# Patient Record
Sex: Male | Born: 1955
Health system: Southern US, Community
[De-identification: ages and names within clinical notes are randomized; demographics above are authoritative.]

## PROBLEM LIST (undated history)

## (undated) DIAGNOSIS — F32A Depression, unspecified: Secondary | ICD-10-CM

## (undated) DIAGNOSIS — E559 Vitamin D deficiency, unspecified: Secondary | ICD-10-CM

## (undated) DIAGNOSIS — B019 Varicella without complication: Secondary | ICD-10-CM

## (undated) DIAGNOSIS — T8859XA Other complications of anesthesia, initial encounter: Secondary | ICD-10-CM

## (undated) DIAGNOSIS — R0602 Shortness of breath: Secondary | ICD-10-CM

## (undated) DIAGNOSIS — I10 Essential (primary) hypertension: Secondary | ICD-10-CM

## (undated) DIAGNOSIS — R609 Edema, unspecified: Secondary | ICD-10-CM

## (undated) DIAGNOSIS — T4145XA Adverse effect of unspecified anesthetic, initial encounter: Secondary | ICD-10-CM

## (undated) DIAGNOSIS — M2550 Pain in unspecified joint: Secondary | ICD-10-CM

## (undated) DIAGNOSIS — E669 Obesity, unspecified: Secondary | ICD-10-CM

## (undated) DIAGNOSIS — R7303 Prediabetes: Secondary | ICD-10-CM

## (undated) DIAGNOSIS — M1990 Unspecified osteoarthritis, unspecified site: Secondary | ICD-10-CM

## (undated) DIAGNOSIS — M7989 Other specified soft tissue disorders: Secondary | ICD-10-CM

## (undated) HISTORY — DX: Varicella without complication: B01.9

## (undated) HISTORY — DX: Essential (primary) hypertension: I10

## (undated) HISTORY — DX: Vitamin D deficiency, unspecified: E55.9

## (undated) HISTORY — DX: Other specified soft tissue disorders: M79.89

## (undated) HISTORY — DX: Shortness of breath: R06.02

## (undated) HISTORY — DX: Obesity, unspecified: E66.9

## (undated) HISTORY — DX: Unspecified osteoarthritis, unspecified site: M19.90

## (undated) HISTORY — DX: Edema, unspecified: R60.9

## (undated) HISTORY — DX: Pain in unspecified joint: M25.50

## (undated) HISTORY — DX: Prediabetes: R73.03

## (undated) HISTORY — DX: Depression, unspecified: F32.A

## (undated) HISTORY — PX: OTHER SURGICAL HISTORY: SHX169

---
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
## 1998-04-12 ENCOUNTER — Ambulatory Visit (HOSPITAL_BASED_OUTPATIENT_CLINIC_OR_DEPARTMENT_OTHER): Admission: RE | Admit: 1998-04-12 | Discharge: 1998-04-12 | Payer: Self-pay | Admitting: Orthopaedic Surgery

## 1998-05-07 HISTORY — PX: KNEE SURGERY: SHX244

## 2005-11-09 ENCOUNTER — Ambulatory Visit: Payer: Self-pay | Admitting: Family Medicine

## 2005-11-09 LAB — CONVERTED CEMR LAB: PSA: 3.5 ng/mL

## 2005-11-15 ENCOUNTER — Ambulatory Visit: Payer: Self-pay | Admitting: Family Medicine

## 2005-11-22 ENCOUNTER — Ambulatory Visit: Payer: Self-pay | Admitting: Family Medicine

## 2005-12-10 ENCOUNTER — Ambulatory Visit: Payer: Self-pay | Admitting: Family Medicine

## 2005-12-24 ENCOUNTER — Ambulatory Visit: Payer: Self-pay | Admitting: Family Medicine

## 2006-10-31 ENCOUNTER — Encounter: Payer: Self-pay | Admitting: Family Medicine

## 2006-10-31 DIAGNOSIS — I10 Essential (primary) hypertension: Secondary | ICD-10-CM | POA: Insufficient documentation

## 2006-11-14 ENCOUNTER — Ambulatory Visit: Payer: Self-pay | Admitting: Family Medicine

## 2006-11-14 LAB — CONVERTED CEMR LAB
ALT: 42 units/L (ref 0–53)
AST: 24 units/L (ref 0–37)
Albumin: 3.7 g/dL (ref 3.5–5.2)
Alkaline Phosphatase: 61 units/L (ref 39–117)
BUN: 10 mg/dL (ref 6–23)
Basophils Absolute: 0 10*3/uL (ref 0.0–0.1)
Basophils Relative: 0.4 % (ref 0.0–1.0)
Bilirubin, Direct: 0.1 mg/dL (ref 0.0–0.3)
CO2: 29 meq/L (ref 19–32)
Calcium: 8.6 mg/dL (ref 8.4–10.5)
Chloride: 102 meq/L (ref 96–112)
Cholesterol: 195 mg/dL (ref 0–200)
Creatinine, Ser: 1.1 mg/dL (ref 0.4–1.5)
Eosinophils Absolute: 0 10*3/uL (ref 0.0–0.6)
Eosinophils Relative: 0.4 % (ref 0.0–5.0)
GFR calc Af Amer: 90 mL/min
GFR calc non Af Amer: 75 mL/min
Glucose, Bld: 100 mg/dL — ABNORMAL HIGH (ref 70–99)
HCT: 42.4 % (ref 39.0–52.0)
HDL: 43.1 mg/dL (ref >39.0)
Hemoglobin: 14.3 g/dL (ref 13.0–17.0)
LDL Cholesterol: 137 mg/dL — ABNORMAL HIGH (ref 0–99)
Lymphocytes Relative: 25.2 % (ref 12.0–46.0)
MCHC: 33.7 g/dL (ref 30.0–36.0)
MCV: 87.4 fL (ref 78.0–100.0)
Monocytes Absolute: 0.4 10*3/uL (ref 0.2–0.7)
Monocytes Relative: 6.9 % (ref 3.0–11.0)
Neutro Abs: 3.9 10*3/uL (ref 1.4–7.7)
Neutrophils Relative %: 67.1 % (ref 43.0–77.0)
PSA: 0.28 ng/mL (ref 0.10–4.00)
Platelets: 207 10*3/uL (ref 150–400)
Potassium: 3.9 meq/L (ref 3.5–5.1)
RBC: 4.85 M/uL (ref 4.22–5.81)
RDW: 13.5 % (ref 11.5–14.6)
Sodium: 139 meq/L (ref 135–145)
TSH: 1.95 microintl units/mL (ref 0.35–5.50)
Total Bilirubin: 0.9 mg/dL (ref 0.3–1.2)
Total CHOL/HDL Ratio: 4.5
Total Protein: 7.3 g/dL (ref 6.0–8.3)
Triglycerides: 76 mg/dL (ref 0–149)
VLDL: 15 mg/dL (ref 0–40)
WBC: 5.8 10*3/uL (ref 4.5–10.5)

## 2007-01-20 ENCOUNTER — Ambulatory Visit: Payer: Self-pay | Admitting: Family Medicine

## 2007-04-25 ENCOUNTER — Telehealth: Payer: Self-pay | Admitting: Family Medicine

## 2008-06-09 ENCOUNTER — Telehealth: Payer: Self-pay | Admitting: Family Medicine

## 2010-06-06 NOTE — Progress Notes (Signed)
Summary: Cough Rx request  Phone Note Call from Patient Call back at Home Phone [phone]   Caller: Patient Call For: Roderick Pee MD Summary of Call: Wife called back to inform Dr Tawanna Cooler she saw him for cough approx 2 years ago, no cough syrup on med list, [facility] does not have record of a RF. Requesting cough Rx for pt. Initial call taken by: Sid Falcon LPN,  June 09, 2008 10:37 AM  Follow-up for Phone Call        Hydromet dispense 8 ounces directions one or 2 teaspoons t.i.d. p.r.n. cough, refills x 1 Follow-up by: Roderick Pee MD,  June 09, 2008 12:15 PM  Additional Follow-up for Phone Call Additional follow up Details #1::        Rx telephoned in, pt informed on personal VM to check with his pharmacy Additional Follow-up by: Sid Falcon LPN,  June 09, 2008 1:29 PM    New/Updated Medications: HYDROMET 5-1.5 MG/5ML SYRP (HYDROCODONE-HOMATROPINE) one to two tsp every 8 hours as needed for cough   Prescriptions: HYDROMET 5-1.5 MG/5ML SYRP (HYDROCODONE-HOMATROPINE) one to two tsp every 8 hours as needed for cough  #8oz x 1   Entered by:   Sid Falcon LPN   Authorized by:   Roderick Pee MD   Signed by:   Sid Falcon LPN on 02/72/5366   Method used:   Telephoned to ...       [facility] [address] (retail)       [address]       [place], [place]  [address]       Ph: [phone]       Fax: [phone]   RxID:   [id number]

## 2010-06-06 NOTE — Progress Notes (Signed)
Summary: new rx for new sig- WHICH DOSE?  Phone Note From Pharmacy Call back at [phone]   Caller: [facility] [address] Call For: James [name]  Summary of Call: Ned new rx for Furosemide 40 mg 1 daily #34 last fill oct 13 08.  Dr James [name] changed his does to 2 tablets daily.  Needs new rx with the new sig  Initial call taken by: Roselle Locus,  April 25, 2007 9:27 AM  Follow-up for Phone Call        okay to call him Lasix 40 mg two tablets daily, dispensed 200, refills x 4 Follow-up by: Roderick Pee MD,  April 25, 2007 1:28 PM    New/Updated Medications: FUROSEMIDE 40 MG  TABS (FUROSEMIDE) 2 TABS once daily FUROSEMIDE 40 MG  TABS (FUROSEMIDE)  FUROSEMIDE 40 MG  TABS (FUROSEMIDE) 2 tabs  once a day   Prescriptions: FUROSEMIDE 40 MG  TABS (FUROSEMIDE) 2 tabs  once a day  #200 x 4   Entered by:   Arcola Jansky, RN   Authorized by:   Roderick Pee MD   Signed by:   Arcola Jansky, RN on 04/25/2007   Method used:   Electronically sent to ...       [facility] [address]       [address]       [place], [place]  [address]       Ph: [phone]       Fax: [phone]   RxID:   [id number]

## 2010-09-22 NOTE — Assessment & Plan Note (Signed)
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   PAST MEDICAL HISTORY:  He had surgery on his right knee when he was 55 years  of age with torn cartilage.  Since that time he has developed increasing  pain.  Now he cannot walk, cannot exercise, cannot climb stairs.  Will get  him to see Lajoyce Corners for evaluation.  He had outpatient surgery on that knee  in 2000, was scoped by [facility] and now again he has pretty  much shot that knee and he is going to need a total knee.   PAST ILLNESSES:  None.   INJURIES:  None.   ALLERGIES:  Drug allergies:  None.   HABITS:  He does not smoke or drink any alcohol.   MEDICATIONS:  He takes no medications.   The issues he would like to discuss are pain in his right knee, the  inability to walk, fluid retention, skin problems, question sleep apnea, and  obesity.   REVIEW OF SYSTEMS:  HEENT:  Negative except he wears glasses for distance  vision.  He gets eye exams every two years for glaucoma.  He gets regular  dental care.  CARDIOPULMONARY:  Negative.  GASTROINTESTINAL:  Negative.  GENITOURINARY:  Negative.  ENDOCRINE:  Negative.  Weight gone up.  He is now  up to 326 pounds at 5 feet 5-1/2 inches.  MUSCULOSKELETAL:  Negative except  the right knee.  VASCULAR:  Negative.  ALLERGY:   Negative.   SOCIAL HISTORY:  He is married, lives here in [place], originally from  [place].  He is married and has two children, Lillia Abed, 22, and Susy Frizzle is 21.  He  is a Buyer, retail of [employer].   FAMILY HISTORY:  Dad died at 69, CHF, diabetes, type 2, peripheral vascular  disease.  Mom died in her 55s of a sarcoma.  Two brothers in  good health.  One has asthma.  Two sisters in good health.  Last tetanus 2004.   PHYSICAL EXAMINATION:  VITAL SIGNS:  Height 5 feet 5-1/2 inches, weight 326  pounds, temperature 97, pulse was 70 and regular, respirations 12 and  regular, blood pressure 174/100, repeat by me same.  GENERAL:  In general he is an obese male, no acute distress.  HEENT:  Negative.  NECK:  Supple.  Thyroid is not enlarged.  No carotid bruits.  CHEST:  Clear to auscultation.  CARDIAC:  Negative.  ABDOMEN:  Massive panniculus.  GENITALIA:  Normal male.  RECTAL:  Normal.  Stool guaiac negative.  Prostate normal.  EXTREMITIES:  Normal.  Skin normal.  No peripheral pulses.  Normal except  for 2+ edema.   LABORATORY DATA:  CBC was normal as was the lipid panel, metabolic panel  except for slight  elevation of blood sugar of 101, with an A1c of 5.5.  TSH  and PSA were normal as was the UA except he has blood in his urine.  He had  a history of stones in the past.  He thinks he might be trying to pass  another one again.   IMPRESSION:  1.  Hypertension.  Plan:  Discussed diet, exercise, weight loss, and      medication.  He cannot exercise because of his knee.  Because of fluid      retention we started Lasix 20 mg a day.  Blood pressure checked b.i.d.      Follow up in one week.  2.  Probable sleep apnea.  Question narcolepsy.  We started him on      __________100 mg daily.  3.  Obesity.  Addressed with diet, exercise, and weight loss after he gets      his knee done.  4.  Degenerative joint disease, right knee.  Refer to Dr. Lajoyce Corners for      consultation for total knee replacement.  5.  Question sleep apnea.  Will try the __________  If this does not work      will go ahead and send him for sleep consultation.  6.  Hematuria.  Drink lots of water.  Recontact urology for evaluation of      stone.   An hour was spent with the patient going through his medical history,  physical exam, laboratory data, and  game plan for follow-up.  Will  aggressively treat his hypertension.  This will need to be done before he  will be able to have any surgery to correct that knee.  Once that is done  hopefully we can get him on a program of diet, exercise, and weight loss.                                   Jeffrey A. Tawanna Cooler, MD   JAT/MedQ  DD:  11/18/2005  DT:  11/18/2005  Job #:  [id number]

## 2012-07-16 ENCOUNTER — Encounter: Payer: Self-pay | Admitting: Family Medicine

## 2012-07-16 ENCOUNTER — Ambulatory Visit (INDEPENDENT_AMBULATORY_CARE_PROVIDER_SITE_OTHER): Payer: BC Managed Care – PPO | Admitting: Family Medicine

## 2012-07-16 VITALS — BP 140/82 | HR 81 | Temp 98.5°F | Ht 65.5 in | Wt 350.0 lb

## 2012-07-16 DIAGNOSIS — Z0000 Encounter for general adult medical examination without abnormal findings: Secondary | ICD-10-CM

## 2012-07-16 DIAGNOSIS — Z7189 Other specified counseling: Secondary | ICD-10-CM

## 2012-07-16 DIAGNOSIS — Z7689 Persons encountering health services in other specified circumstances: Secondary | ICD-10-CM

## 2012-07-16 NOTE — Patient Instructions (Addendum)
-  We have ordered labs or studies at this visit. It can take up to 1-2 weeks for results and processing. We will contact you with instructions IF your results are abnormal. Normal results will be released to your [facility]. If you have not heard from [name] or can not find your results in [facility] in 2 weeks please contact our office.  -PLEASE SIGN UP FOR MYCHART TODAY   We recommend the following healthy lifestyle measures: - eat a healthy diet consisting of lots of vegetables, fruits, beans, nuts, seeds, healthy meats such as white chicken and fish and whole grains.  - avoid fried foods, fast food, processed foods, sodas, red meet and other fattening foods.  - get a least 150 minutes of aerobic exercise per week.   Please let us know if you decide to do a colonoscopy  Follow up in: 3 months

## 2012-07-16 NOTE — Progress Notes (Signed)
Chief Complaint  Patient presents with  . Establish Care  . Sinus Problem    congestion, pressure and pain, dizziness, headache     HPI:  James [name] is here to establish care. Used to see Dr. Tawanna Cooler, but hasn't seen a doctor in a while. Wife sees Dr. Tawanna Cooler. Last PCP and physical:  Has the following chronic problems and concerns today:  Patient Active Problem List  Diagnosis  . OBESITY, MORBID  . HYPERTENSION   Sinus Congestion: -7-10 days -symptoms: sinus congestion, sinus pain and pressure, drainage in throat, dizzy, eat pain -seems like improving - no dizziness today -tried some zyrtec  Health Maintenance: -he will check on insurance for colonoscopy  ROS: See pertinent positives and negatives per HPI.  Past Medical History  Diagnosis Date  . Hypertension     Family History  Problem Relation Age of Onset  . Cancer Mother     lung and soft tissue cancer from radiation  . Heart disease Father     CHF  . Diabetes Father   . Heart disease Maternal Grandfather   . Diabetes Paternal Grandmother     History   Social History  . Marital Status: Married    Spouse Name: N/A    Number of Children: N/A  . Years of Education: N/A   Social History Main Topics  . Smoking status: Former Smoker    Quit date: 07/16/1992  . Smokeless tobacco: None  . Alcohol Use: Yes     Comment: very little per patient   . Drug Use: No  . Sexually Active: None   Other Topics Concern  . 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: living with and caring for wife whom had stroke and brain tumor      Spiritual Beliefs: Methodist      Lifestyle: no regular exercise, diet poor             Current outpatient prescriptions:aspirin 325 MG tablet, Take 325 mg by mouth daily., Disp: , Rfl: ;  acetaminophen (TYLENOL) 650 MG CR tablet, Take 650 mg by mouth every 8 (eight) hours as needed for pain., Disp: , Rfl:    EXAM:  Filed Vitals:   07/16/12 1105  BP: 140/82  Pulse: 81  Temp: 98.5 F (36.9 C)    Body mass index is 57.34 kg/(m^2).  GENERAL: vitals reviewed and listed above, alert, oriented, appears well hydrated and in no acute distress  HEENT: atraumatic, conjunttiva clear, no obvious abnormalities on inspection of external nose and ears  NECK: no obvious masses on inspection  LUNGS: clear to auscultation bilaterally, no wheezes, rales or rhonchi, good air movement  CV: HRRR, no peripheral edema  MS: moves all extremities without noticeable abnormality  PSYCH: pleasant and cooperative, no obvious depression or anxiety  ASSESSMENT AND PLAN:  Discussed the following assessment and plan:  Encounter to establish care  Visit for preventive health examination - Plan: Lipid Panel, Basic metabolic panel, Hemoglobin A1c  -We reviewed the PMH, PSH, FH, SH, Meds and Allergies. -discussed USPSTF level A and B recommendations -will do FASTING LABS tomorrow -refused prostate cancer screening after discussion -will check with insurance on colonoscopy -reports UTD on vaccines -advised healthy diet, regular exercise and counseled extensively on importance of weight loss, advised of [place] and [facility] weight loss recourses -follow up in 3 months  -Patient advised to return or  notify a doctor immediately if symptoms worsen or persist or new concerns arise.  Patient Instructions  -We have ordered labs or studies at this visit. It can take up to 1-2 weeks for results and processing. We will contact you with instructions IF your results are abnormal. Normal results will be released to your [facility]. If you have not heard from [name] or can not find your results in [facility] in 2 weeks please contact our office.  -PLEASE SIGN UP FOR MYCHART TODAY   We recommend the following healthy lifestyle measures: - eat a healthy diet consisting of lots of vegetables, fruits, beans, nuts, seeds, healthy  meats such as white chicken and fish and whole grains.  - avoid fried foods, fast food, processed foods, sodas, red meet and other fattening foods.  - get a least 150 minutes of aerobic exercise per week.   Please let us know if you decide to do a colonoscopy  Follow up in: 3 months      KIM, HANNAH R.

## 2012-07-17 ENCOUNTER — Telehealth: Payer: Self-pay | Admitting: Family Medicine

## 2012-07-17 ENCOUNTER — Other Ambulatory Visit (INDEPENDENT_AMBULATORY_CARE_PROVIDER_SITE_OTHER): Payer: BC Managed Care – PPO

## 2012-07-17 DIAGNOSIS — Z0000 Encounter for general adult medical examination without abnormal findings: Secondary | ICD-10-CM

## 2012-07-17 LAB — LIPID PANEL
Cholesterol: 175 mg/dL (ref 0–200)
HDL: 41.6 mg/dL (ref >39.00)
LDL Cholesterol: 116 mg/dL — ABNORMAL HIGH (ref 0–99)
Total CHOL/HDL Ratio: 4
Triglycerides: 87 mg/dL (ref 0.0–149.0)
VLDL: 17.4 mg/dL (ref 0.0–40.0)

## 2012-07-17 LAB — BASIC METABOLIC PANEL
BUN: 18 mg/dL (ref 6–23)
CO2: 28 mEq/L (ref 19–32)
Calcium: 8.6 mg/dL (ref 8.4–10.5)
Chloride: 99 mEq/L (ref 96–112)
Creatinine, Ser: 0.9 mg/dL (ref 0.4–1.5)
GFR: 88.51 mL/min (ref >60.00)
Glucose, Bld: 90 mg/dL (ref 70–99)
Potassium: 3.4 mEq/L — ABNORMAL LOW (ref 3.5–5.1)
Sodium: 138 mEq/L (ref 135–145)

## 2012-07-17 LAB — HEMOGLOBIN A1C: Hgb A1c MFr Bld: 5.8 % (ref 4.6–6.5)

## 2012-07-17 NOTE — Telephone Encounter (Signed)
Labs look pretty good. Diabetes lab is in prediabetic range and cholesterol is mildly elevated. Regular exercise and a healthy diet will be very important. Follow up in 3 months.

## 2012-07-18 NOTE — Telephone Encounter (Signed)
Called and spoke with pt and pt is aware.  

## 2012-09-02 ENCOUNTER — Telehealth: Payer: Self-pay | Admitting: Family Medicine

## 2012-09-02 NOTE — Telephone Encounter (Signed)
OPENED IN ERROR

## 2012-09-02 NOTE — Telephone Encounter (Signed)
PT called Medicare

## 2012-10-16 ENCOUNTER — Ambulatory Visit (INDEPENDENT_AMBULATORY_CARE_PROVIDER_SITE_OTHER): Payer: BC Managed Care – PPO | Admitting: Family Medicine

## 2012-10-16 ENCOUNTER — Encounter: Payer: Self-pay | Admitting: Family Medicine

## 2012-10-16 VITALS — BP 162/80 | Temp 98.3°F | Wt 346.0 lb

## 2012-10-16 DIAGNOSIS — R7309 Other abnormal glucose: Secondary | ICD-10-CM

## 2012-10-16 DIAGNOSIS — R7303 Prediabetes: Secondary | ICD-10-CM

## 2012-10-16 DIAGNOSIS — E785 Hyperlipidemia, unspecified: Secondary | ICD-10-CM

## 2012-10-16 DIAGNOSIS — I10 Essential (primary) hypertension: Secondary | ICD-10-CM

## 2012-10-16 LAB — BASIC METABOLIC PANEL
BUN: 13 mg/dL (ref 6–23)
CO2: 23 mEq/L (ref 19–32)
Calcium: 9.1 mg/dL (ref 8.4–10.5)
Chloride: 106 mEq/L (ref 96–112)
Creatinine, Ser: 0.9 mg/dL (ref 0.4–1.5)
GFR: 91.84 mL/min (ref >60.00)
Glucose, Bld: 110 mg/dL — ABNORMAL HIGH (ref 70–99)
Potassium: 3.6 mEq/L (ref 3.5–5.1)
Sodium: 141 mEq/L (ref 135–145)

## 2012-10-16 MED ORDER — HYDROCHLOROTHIAZIDE 25 MG PO TABS: 25.0000 mg | ORAL_TABLET | Freq: Every day | ORAL | Status: DC

## 2012-10-16 MED ORDER — LISINOPRIL 5 MG PO TABS: 5.0000 mg | ORAL_TABLET | Freq: Every day | ORAL | Status: DC

## 2012-10-16 NOTE — Addendum Note (Signed)
Addended by: Terressa Koyanagi on: 10/16/2012 01:05 PM   Modules accepted: Orders

## 2012-10-16 NOTE — Progress Notes (Signed)
Chief Complaint  Patient presents with  . 3 month follow up    HPI:  Follow up:  1)HTN: -hx of ups and downs with HTN -reports on BP medication once in the past then lost insurance and off since -Denies: CP, sOB, DOE, palpitations  2)Prediabetes: -no regular exercise, diet is ok -denies polyuria, polydipsia, vision changes, fatgue  3)HLD/morbid obesity: -no regular exercise -taking care of wife who had stroke  4) Chronic venous insufficieny/lower ext edema -wears compression occ -some skin changes for 4-5 years with this  Health Maintenance: -colon cancer screening: does not want to do colonoscopy now, ok with doing stool cards  ROS: See pertinent positives and negatives per HPI.  Past Medical History  Diagnosis Date  . Hypertension   . Arthritis   . Chicken pox   . Hypertension     Family History  Problem Relation Age of Onset  . Cancer Mother     lung and soft tissue cancer from radiation  . Heart disease Father     CHF  . Diabetes Father   . Heart disease Maternal Grandfather   . Diabetes Paternal Grandmother     History   Social History  . Marital Status: Married    Spouse Name: N/A    Number of Children: N/A  . Years of Education: N/A   Social History Main Topics  . Smoking status: Former Smoker    Quit date: 07/16/1992  . Smokeless tobacco: None  . Alcohol Use: Yes     Comment: very little per patient   . Drug Use: No  . Sexually Active: None   Other Topics Concern  . 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: living with and caring for wife whom had stroke and brain tumor      Spiritual Beliefs: Methodist      Lifestyle: no regular exercise, diet poor             Current outpatient prescriptions:ibuprofen (ADVIL,MOTRIN) 200 MG tablet, Take 200 mg by mouth every 6 (six) hours as needed for pain., Disp: , Rfl: ;  acetaminophen (TYLENOL) 650 MG CR tablet,  Take 650 mg by mouth every 8 (eight) hours as needed for pain., Disp: , Rfl: ;  aspirin 325 MG tablet, Take 325 mg by mouth daily., Disp: , Rfl:   EXAM:  Filed Vitals:   10/16/12 0847  BP: 162/80  Temp: 98.3 F (36.8 C)    Body mass index is 56.68 kg/(m^2).  GENERAL: vitals reviewed and listed above, alert, oriented, appears well hydrated and in no acute distress  HEENT: atraumatic, conjunttiva clear, no obvious abnormalities on inspection of external nose and ears  NECK: no obvious masses on inspection  LUNGS: clear to auscultation bilaterally, no wheezes, rales or rhonchi, good air movement  CV: HRRR, bilat LE edema with venous stasis changes  MS: moves all extremities without noticeable abnormality  PSYCH: pleasant and cooperative, no obvious depression or anxiety  ASSESSMENT AND PLAN:  Discussed the following assessment and plan:  HYPERTENSION - Plan: Basic metabolic panel  OBESITY, MORBID  Prediabetes  Hyperlipemia  -will check BMP and if K+ ok start diuretic for HTN and Edema. If K+ low will do acei/diuretic together - discussed options and risks with pt -counseled at length on importance of lifestyle changes and weight loss, weight loss options, he is taking care of wife right now, but  will try to start exercising and work on diet -discussed colon cancer screening, he refused a colonoscopy, he was given stool cards -Patient advised to return or notify a doctor immediately if symptoms worsen or persist or new concerns arise.  Patient Instructions  -compression stockings daily  -elevate legs at night  -after labs we will call you to start blood pressure/fluid pill  -follow up in 3 months     Caidan Hubbert R.

## 2012-10-16 NOTE — Patient Instructions (Addendum)
-  compression stockings daily  -elevate legs at night  -after labs we will call you to start blood pressure/fluid pill  -follow up in 3 months

## 2012-10-17 NOTE — Progress Notes (Signed)
Quick Note:  Per pt's wife pt received labs on MyChart. Pt will get the two prescriptions. ______

## 2012-10-29 ENCOUNTER — Other Ambulatory Visit: Payer: BC Managed Care – PPO

## 2012-12-17 ENCOUNTER — Other Ambulatory Visit: Payer: Self-pay | Admitting: Family Medicine

## 2013-01-15 ENCOUNTER — Encounter: Payer: Self-pay | Admitting: Family Medicine

## 2013-01-15 ENCOUNTER — Ambulatory Visit (INDEPENDENT_AMBULATORY_CARE_PROVIDER_SITE_OTHER): Payer: BC Managed Care – PPO | Admitting: Family Medicine

## 2013-01-15 VITALS — BP 120/84 | Temp 98.7°F | Wt 339.0 lb

## 2013-01-15 DIAGNOSIS — Z23 Encounter for immunization: Secondary | ICD-10-CM

## 2013-01-15 DIAGNOSIS — I10 Essential (primary) hypertension: Secondary | ICD-10-CM

## 2013-01-15 MED ORDER — LISINOPRIL 5 MG PO TABS: 5.0000 mg | ORAL_TABLET | Freq: Every day | ORAL | Status: DC

## 2013-01-15 MED ORDER — HYDROCHLOROTHIAZIDE 25 MG PO TABS: 25.0000 mg | ORAL_TABLET | Freq: Every day | ORAL | Status: DC

## 2013-01-15 NOTE — Progress Notes (Signed)
Chief Complaint  Patient presents with  . 3 month follow up    HPI:  Follow up:  HTN/Obesity: -takes ASA, lisinopril, HCTZ -diet and exercise: no regular exercise; trying to work on portion sizes and trying to work on healthier diet   ROS: See pertinent positives and negatives per HPI.  Past Medical History  Diagnosis Date  . Hypertension   . Arthritis   . Chicken pox   . Hypertension     Past Surgical History  Procedure Laterality Date  . Knee surgery  2000  . Cartilage removal      Family History  Problem Relation Age of Onset  . Cancer Mother     lung and soft tissue cancer from radiation  . Heart disease Father     CHF  . Diabetes Father   . Heart disease Maternal Grandfather   . Diabetes Paternal Grandmother     History   Social History  . Marital Status: Married    Spouse Name: N/A    Number of Children: N/A  . Years of Education: N/A   Social History Main Topics  . Smoking status: Former Smoker    Quit date: 07/16/1992  . Smokeless tobacco: None  . Alcohol Use: Yes     Comment: very little per patient   . Drug Use: No  . Sexual Activity: None   Other Topics Concern  . 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: living with and caring for wife whom had stroke and brain tumor      Spiritual Beliefs: Methodist      Lifestyle: no regular exercise, diet poor             Current outpatient prescriptions:acetaminophen (TYLENOL) 650 MG CR tablet, Take 650 mg by mouth every 8 (eight) hours as needed for pain., Disp: , Rfl: ;  aspirin 325 MG tablet, Take 325 mg by mouth daily., Disp: , Rfl: ;  ibuprofen (ADVIL,MOTRIN) 200 MG tablet, Take 200 mg by mouth every 6 (six) hours as needed for pain., Disp: , Rfl:  hydrochlorothiazide (HYDRODIURIL) 25 MG tablet, Take 1 tablet (25 mg total) by mouth daily., Disp: 90 tablet, Rfl: 3;  lisinopril (PRINIVIL,ZESTRIL) 5 MG tablet, Take 1  tablet (5 mg total) by mouth daily., Disp: 90 tablet, Rfl: 3  EXAM:  Filed Vitals:   01/15/13 1016  BP: 120/84  Temp: 98.7 F (37.1 C)    Body mass index is 55.53 kg/(m^2).  GENERAL: vitals reviewed and listed above, alert, oriented, appears well hydrated and in no acute distress  HEENT: atraumatic, conjunttiva clear, no obvious abnormalities on inspection of external nose and ears  NECK: no obvious masses on inspection  LUNGS: clear to auscultation bilaterally, no wheezes, rales or rhonchi, good air movement  CV: HRRR, no peripheral edema  MS: moves all extremities without noticeable abnormality  PSYCH: pleasant and cooperative, no obvious depression or anxiety  ASSESSMENT AND PLAN:  Discussed the following assessment and plan:  Need for prophylactic vaccination and inoculation against influenza - Plan: Flu Vaccine QUAD 36+ mos PF IM (Fluarix)  Essential hypertension, benign - Plan: hydrochlorothiazide (HYDRODIURIL) 25 MG tablet, lisinopril (PRINIVIL,ZESTRIL) 5 MG tablet  OBESITY, MORBID  -doing well, some weight loss -discussed weight loss options, info provided -having nurse check on on stool cards he sent in -continue current meds -labs next visit -Patient advised to return or notify a  doctor immediately if symptoms worsen or persist or new concerns arise.  Patient Instructions  We recommend the following healthy lifestyle measures: - eat a healthy diet consisting of lots of vegetables, fruits, beans, nuts, seeds, healthy meats such as white chicken and fish and whole grains.  - avoid fried foods, fast food, processed foods, sodas, red meet and other fattening foods.  - get a least 150 minutes of aerobic exercise per week.   [place] [facility] at [phone] to sign up for our next Bariatric Surgery Options Seminar. [email or web address]  Follow up in 4-6 months, morning appointment and will do FASTING LABS that day     Tarini Carrier,  Dahlia Client R.

## 2013-01-15 NOTE — Patient Instructions (Signed)
We recommend the following healthy lifestyle measures: - eat a healthy diet consisting of lots of vegetables, fruits, beans, nuts, seeds, healthy meats such as white chicken and fish and whole grains.  - avoid fried foods, fast food, processed foods, sodas, red meet and other fattening foods.  - get a least 150 minutes of aerobic exercise per week.   [place] Connect at [phone] to sign up for our next Bariatric Surgery Options Seminar. [email or web address]  Follow up in 4-6 months, morning appointment and will do FASTING LABS that day

## 2013-05-18 ENCOUNTER — Ambulatory Visit (INDEPENDENT_AMBULATORY_CARE_PROVIDER_SITE_OTHER): Payer: 59 | Admitting: Family Medicine

## 2013-05-18 ENCOUNTER — Encounter: Payer: Self-pay | Admitting: Family Medicine

## 2013-05-18 VITALS — BP 138/80 | HR 67 | Temp 98.8°F | Resp 20 | Wt 336.0 lb

## 2013-05-18 DIAGNOSIS — M25559 Pain in unspecified hip: Secondary | ICD-10-CM

## 2013-05-18 DIAGNOSIS — I10 Essential (primary) hypertension: Secondary | ICD-10-CM

## 2013-05-18 DIAGNOSIS — M25552 Pain in left hip: Secondary | ICD-10-CM

## 2013-05-18 LAB — LIPID PANEL
Cholesterol: 168 mg/dL (ref 0–200)
HDL: 43.2 mg/dL (ref >39.00)
LDL Cholesterol: 112 mg/dL — ABNORMAL HIGH (ref 0–99)
Total CHOL/HDL Ratio: 4
Triglycerides: 65 mg/dL (ref 0.0–149.0)
VLDL: 13 mg/dL (ref 0.0–40.0)

## 2013-05-18 LAB — BASIC METABOLIC PANEL
BUN: 18 mg/dL (ref 6–23)
CO2: 28 mEq/L (ref 19–32)
Calcium: 8.8 mg/dL (ref 8.4–10.5)
Chloride: 105 mEq/L (ref 96–112)
Creatinine, Ser: 1 mg/dL (ref 0.4–1.5)
GFR: 82.11 mL/min (ref >60.00)
Glucose, Bld: 99 mg/dL (ref 70–99)
Potassium: 4.1 mEq/L (ref 3.5–5.1)
Sodium: 141 mEq/L (ref 135–145)

## 2013-05-18 NOTE — Patient Instructions (Signed)
-  tylenol 500-1000mg  up to 3 times daily for hip - if pain persists in 2-3 weeks follow up with [name] or we can place referral to ortho if you call and let us know  -We have ordered labs or studies at this visit. It can take up to 1-2 weeks for results and processing. We will contact you with instructions IF your results are abnormal. Normal results will be released to your [facility]. If you have not heard from [name] or can not find your results in [facility] in 2 weeks please contact our office.  -PLEASE SIGN UP FOR MYCHART TODAY   We recommend the following healthy lifestyle measures: - eat a healthy diet consisting of lots of vegetables, fruits, beans, nuts, seeds, healthy meats such as white chicken and fish and whole grains.  - avoid fried foods, fast food, processed foods, sodas, red meet and other fattening foods.  - get a least 150 minutes of aerobic exercise per week.   Follow up in: 3 weeks

## 2013-05-18 NOTE — Progress Notes (Signed)
Chief Complaint  Patient presents with  . Follow-up    Hypertension    HPI:  Follow up:  HTN:/Obesity: -meds: ASA, lisinopril HCTZ -Lifestyle: no regular exercise, diet ok  L hip pain: -started 2 weeks ago -can't remember what he did -L hip and groin, worse with walking - sometimes improves after being up for awhile -mild-mod pain, achy -denies: fevers, malaise, weakness, numbness, bowel or bladder incontinence   ROS: See pertinent positives and negatives per HPI.  Past Medical History  Diagnosis Date  . Hypertension   . Arthritis   . Chicken pox   . Hypertension     Past Surgical History  Procedure Laterality Date  . Knee surgery  2000  . Cartilage removal      Family History  Problem Relation Age of Onset  . Cancer Mother     lung and soft tissue cancer from radiation  . Heart disease Father     CHF  . Diabetes Father   . Heart disease Maternal Grandfather   . Diabetes Paternal Grandmother     History   Social History  . Marital Status: Married    Spouse Name: N/A    Number of Children: N/A  . Years of Education: N/A   Social History Main Topics  . Smoking status: Former Smoker    Quit date: 07/16/1992  . Smokeless tobacco: None  . Alcohol Use: Yes     Comment: very little per patient   . Drug Use: No  . Sexual Activity: None   Other Topics Concern  . 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: living with and caring for wife whom had stroke and brain tumor      Spiritual Beliefs: Methodist      Lifestyle: no regular exercise, diet poor             Current outpatient prescriptions:aspirin 325 MG tablet, Take 325 mg by mouth daily., Disp: , Rfl: ;  hydrochlorothiazide (HYDRODIURIL) 25 MG tablet, Take 1 tablet (25 mg total) by mouth daily., Disp: 90 tablet, Rfl: 3;  ibuprofen (ADVIL,MOTRIN) 200 MG tablet, Take 200 mg by mouth every 6 (six) hours as needed for  pain., Disp: , Rfl: ;  lisinopril (PRINIVIL,ZESTRIL) 5 MG tablet, Take 1 tablet (5 mg total) by mouth daily., Disp: 90 tablet, Rfl: 3 Multiple Vitamins-Minerals (MULTIVITAMIN WITH MINERALS) tablet, Take 1 tablet by mouth daily., Disp: , Rfl: ;  acetaminophen (TYLENOL) 650 MG CR tablet, Take 650 mg by mouth every 8 (eight) hours as needed for pain., Disp: , Rfl:   EXAM:  Filed Vitals:   05/18/13 0848  BP: 138/80  Pulse: 67  Temp: 98.8 F (37.1 C)  Resp: 20    Body mass index is 55.04 kg/(m^2).  GENERAL: vitals reviewed and listed above, alert, oriented, appears well hydrated and in no acute distress  HEENT: atraumatic, conjunttiva clear, no obvious abnormalities on inspection of external nose and ears  NECK: no obvious masses on inspection  LUNGS: clear to auscultation bilaterally, no wheezes, rales or rhonchi, good air movement  CV: HRRR, no peripheral edema  MS: moves all extremities without noticeable abnormality Normal Gait Normal inspection of back, no obvious scoliosis or leg length descrepancy No bony TTP Soft tissue TTP at: none -/+ tests: neg trendelenburg,-facet loading, -SLRT, -CLRT, -FABER, -FADIR Normal muscle strength, sensation to light touch and DTRs in LEs bilaterally  PSYCH: pleasant and cooperative, no obvious depression or anxiety  ASSESSMENT AND PLAN:  Discussed the following assessment and plan:  HYPERTENSION - Plan: Lipid Panel, Basic metabolic panel  Severe obesity (BMI >= 40) - Plan: Lipid Panel  Hip pain, acute, left -exam normal, offered imaging or referral to ortho (suspect hip OA, but discussed potential for other etiologies) - pt refused and prefers to try tylenol and give it a few weeks -advised to follow up in 3 weeks if persists or worsens  FASTING labs -Patient advised to return or notify a doctor immediately if symptoms worsen or persist or new concerns arise.  Patient Instructions  -tylenol 500-1000mg  up to 3 times daily for hip -  if pain persists in 2-3 weeks follow up with [name] or we can place referral to ortho if you call and let us know  -We have ordered labs or studies at this visit. It can take up to 1-2 weeks for results and processing. We will contact you with instructions IF your results are abnormal. Normal results will be released to your [facility]. If you have not heard from [name] or can not find your results in [facility] in 2 weeks please contact our office.  -PLEASE SIGN UP FOR MYCHART TODAY   We recommend the following healthy lifestyle measures: - eat a healthy diet consisting of lots of vegetables, fruits, beans, nuts, seeds, healthy meats such as white chicken and fish and whole grains.  - avoid fried foods, fast food, processed foods, sodas, red meet and other fattening foods.  - get a least 150 minutes of aerobic exercise per week.   Follow up in: 3 weeks      James [name] R.

## 2013-05-19 ENCOUNTER — Telehealth: Payer: Self-pay | Admitting: Family Medicine

## 2013-05-19 NOTE — Telephone Encounter (Signed)
Relevant patient education assigned to patient using Emmi. ° °

## 2013-06-01 ENCOUNTER — Telehealth: Payer: Self-pay | Admitting: Family Medicine

## 2013-06-01 DIAGNOSIS — M25559 Pain in unspecified hip: Secondary | ICD-10-CM

## 2013-06-01 NOTE — Telephone Encounter (Signed)
Called and spoke with pt and pt is aware.  

## 2013-06-01 NOTE — Telephone Encounter (Signed)
Pt states his hip pain is not better and will need referral to orthopedist. Pt has seen dr Collier Salina daldrf in the past.

## 2013-06-01 NOTE — Telephone Encounter (Signed)
Referral sent 

## 2013-09-16 ENCOUNTER — Ambulatory Visit (INDEPENDENT_AMBULATORY_CARE_PROVIDER_SITE_OTHER): Payer: 59 | Admitting: Family Medicine

## 2013-09-16 ENCOUNTER — Encounter: Payer: Self-pay | Admitting: Family Medicine

## 2013-09-16 VITALS — BP 90/64 | HR 87 | Temp 98.1°F | Ht 65.5 in | Wt 300.0 lb

## 2013-09-16 DIAGNOSIS — M25559 Pain in unspecified hip: Secondary | ICD-10-CM

## 2013-09-16 DIAGNOSIS — M1612 Unilateral primary osteoarthritis, left hip: Secondary | ICD-10-CM

## 2013-09-16 DIAGNOSIS — M1610 Unilateral primary osteoarthritis, unspecified hip: Secondary | ICD-10-CM

## 2013-09-16 DIAGNOSIS — M169 Osteoarthritis of hip, unspecified: Secondary | ICD-10-CM

## 2013-09-16 MED ORDER — CYCLOBENZAPRINE HCL 10 MG PO TABS: 10.0000 mg | ORAL_TABLET | Freq: Three times a day (TID) | ORAL | Status: DC | PRN

## 2013-09-16 NOTE — Patient Instructions (Addendum)
-  try tyelenol 500-1000mg  up to 3 times daily instead of meloxicam and see if this helps  -try the muscle relaxer (flexeril) on the bad days but use rarely  -if your wish, try slowly tapering down on the Percocet - maybe once daily for 2 weeks, then every other day for 1-2 weeks, then only on bad days - I do not prescribe this medication long term so would need to see Pain Management if you decide to use opioids long term for you pain  -can use combination of above with topical pain creams too  -continue to stay active  -We placed a referral for you as discussed to the physical therapist. It usually takes about 1-2 weeks to process and schedule this referral. If you have not heard from [name] regarding this appointment in 2 weeks please contact our office.  -continue psychotherapy  -follow up as needed

## 2013-09-16 NOTE — Progress Notes (Signed)
No chief complaint on file.   HPI:  Acute visit for:  1) Hip Pain: -Seen for L hip pain in 05/2013 and advised ortho/imaging - he opted for ortho referral and that was placed -reports: Reports saw Dr. Hal Morales for this at [place] - reports he was told he has bad OA of th hip and was advised to lose weight, meloxicam, Percocet taking 5/325 bid and considering hip replacement - did injection and this helped temporarily -he joined a gym, working on diet and is looking into lap band and has lost 36 lbs -he doesn't want to do surgery -denies: worsening, radiation, weakness, numbness -took one of his wife Robaxin when had a bad flare over the weekend -ice helps -he doesn't like being on opiods and wonders if there is a different route  2)HTN: -on HCTZ and lisinopril    ROS: See pertinent positives and negatives per HPI.  Past Medical History  Diagnosis Date  . Hypertension   . Arthritis   . Chicken pox   . Hypertension     Past Surgical History  Procedure Laterality Date  . Knee surgery  2000  . Cartilage removal      Family History  Problem Relation Age of Onset  . Cancer Mother     lung and soft tissue cancer from radiation  . Heart disease Father     CHF  . Diabetes Father   . Heart disease Maternal Grandfather   . Diabetes Paternal Grandmother     History   Social History  . Marital Status: Married    Spouse Name: N/A    Number of Children: N/A  . Years of Education: N/A   Social History Main Topics  . Smoking status: Former Smoker    Quit date: 07/16/1992  . Smokeless tobacco: None  . Alcohol Use: Yes     Comment: very little per patient   . Drug Use: No  . Sexual Activity: None   Other Topics Concern  . 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: living with and caring for wife whom had stroke and brain tumor      Spiritual Beliefs: Methodist      Lifestyle: no  regular exercise, diet poor             Current outpatient prescriptions:acetaminophen (TYLENOL) 650 MG CR tablet, Take 650 mg by mouth every 8 (eight) hours as needed for pain., Disp: , Rfl: ;  aspirin 325 MG tablet, Take 325 mg by mouth daily., Disp: , Rfl: ;  hydrochlorothiazide (HYDRODIURIL) 25 MG tablet, Take 1 tablet (25 mg total) by mouth daily., Disp: 90 tablet, Rfl: 3 ibuprofen (ADVIL,MOTRIN) 200 MG tablet, Take 200 mg by mouth every 6 (six) hours as needed for pain., Disp: , Rfl: ;  lisinopril (PRINIVIL,ZESTRIL) 5 MG tablet, Take 1 tablet (5 mg total) by mouth daily., Disp: 90 tablet, Rfl: 3;  Multiple Vitamins-Minerals (MULTIVITAMIN WITH MINERALS) tablet, Take 1 tablet by mouth daily., Disp: , Rfl:  cyclobenzaprine (FLEXERIL) 10 MG tablet, Take 1 tablet (10 mg total) by mouth 3 (three) times daily as needed for muscle spasms., Disp: 20 tablet, Rfl: 2  EXAM:  Filed Vitals:   09/16/13 0908  BP: 90/64  Pulse: 87  Temp: 98.1 F (36.7 C)    Body mass index is 49.15 kg/(m^2).  GENERAL: vitals reviewed and listed above, alert, oriented, appears well hydrated  and in no acute distress  HEENT: atraumatic, conjunttiva clear, no obvious abnormalities on inspection of external nose and ears  NECK: no obvious masses on inspection  LUNGS: clear to auscultation bilaterally, no wheezes, rales or rhonchi, good air movement  CV: HRRR, no peripheral edema  MS: moves all extremities without noticeable abnormality  PSYCH: pleasant and cooperative, no obvious depression or anxiety  ASSESSMENT AND PLAN:  Discussed the following assessment and plan:  Hip pain - Plan: cyclobenzaprine (FLEXERIL) 10 MG tablet -discussed options for chronic pain management - he prefers not to continue opiods unless all else fails - trial of tylenol, muscle relaxer - robaxin not preferred so he wants to try flexeril and opiods on bad days, topical creams, exercise, PT, psychotherapy (ken frazier), acupunture  (Ni Ling) -pain clinic if opts for opoids to optimize pain management -follow up with ortho  -return to me as needed  Osteoarthritis of left hip  -Patient advised to return or notify a doctor immediately if symptoms worsen or persist or new concerns arise.  Patient Instructions  -try tyelenol 500-1000mg  up to 3 times daily instead of meloxicam and see if this helps  -try the muscle relaxer (flexeril) on the bad days but use rarely  -if your wish, try slowly tapering down on the Percocet - maybe once daily for 2 weeks, then every other day for 1-2 weeks, then only on bad days - I do not prescribe this medication long term so would need to see Pain Management if you decide to use opioids long term for you pain  -can use combination of above with topical pain creams too  -continue to stay active  -We placed a referral for you as discussed to the physical therapist. It usually takes about 1-2 weeks to process and schedule this referral. If you have not heard from [name] regarding this appointment in 2 weeks please contact our office.  -continue psychotherapy  -follow up as needed     Lucretia Kern

## 2013-09-16 NOTE — Progress Notes (Signed)
Pre visit review using our clinic review tool, if applicable. No additional management support is needed unless otherwise documented below in the visit note. 

## 2013-09-21 ENCOUNTER — Ambulatory Visit: Payer: 59 | Attending: Family Medicine | Admitting: Physical Therapy

## 2013-09-21 DIAGNOSIS — IMO0001 Reserved for inherently not codable concepts without codable children: Secondary | ICD-10-CM | POA: Insufficient documentation

## 2013-09-21 DIAGNOSIS — R5381 Other malaise: Secondary | ICD-10-CM | POA: Insufficient documentation

## 2013-09-21 DIAGNOSIS — M25559 Pain in unspecified hip: Secondary | ICD-10-CM | POA: Insufficient documentation

## 2013-09-24 ENCOUNTER — Ambulatory Visit: Payer: 59 | Admitting: Physical Therapy

## 2013-09-29 ENCOUNTER — Ambulatory Visit: Payer: 59 | Admitting: Physical Therapy

## 2013-10-01 ENCOUNTER — Ambulatory Visit: Payer: 59 | Admitting: Physical Therapy

## 2013-10-06 ENCOUNTER — Ambulatory Visit: Payer: 59 | Attending: Family Medicine | Admitting: Physical Therapy

## 2013-10-06 DIAGNOSIS — R5381 Other malaise: Secondary | ICD-10-CM | POA: Diagnosis not present

## 2013-10-06 DIAGNOSIS — IMO0001 Reserved for inherently not codable concepts without codable children: Secondary | ICD-10-CM | POA: Insufficient documentation

## 2013-10-06 DIAGNOSIS — M25559 Pain in unspecified hip: Secondary | ICD-10-CM | POA: Diagnosis not present

## 2013-10-07 ENCOUNTER — Other Ambulatory Visit: Payer: Self-pay | Admitting: Family Medicine

## 2013-10-08 ENCOUNTER — Ambulatory Visit: Payer: 59

## 2013-10-08 DIAGNOSIS — IMO0001 Reserved for inherently not codable concepts without codable children: Secondary | ICD-10-CM | POA: Diagnosis not present

## 2013-10-13 ENCOUNTER — Ambulatory Visit: Payer: 59 | Admitting: Physical Therapy

## 2013-10-13 DIAGNOSIS — IMO0001 Reserved for inherently not codable concepts without codable children: Secondary | ICD-10-CM | POA: Diagnosis not present

## 2013-10-15 ENCOUNTER — Ambulatory Visit: Payer: 59 | Admitting: Physical Therapy

## 2013-11-30 ENCOUNTER — Ambulatory Visit (INDEPENDENT_AMBULATORY_CARE_PROVIDER_SITE_OTHER): Payer: 59 | Admitting: Family Medicine

## 2013-11-30 ENCOUNTER — Encounter: Payer: Self-pay | Admitting: Family Medicine

## 2013-11-30 VITALS — BP 126/82 | HR 75 | Temp 98.0°F | Ht 65.5 in | Wt 304.0 lb

## 2013-11-30 DIAGNOSIS — L608 Other nail disorders: Secondary | ICD-10-CM

## 2013-11-30 DIAGNOSIS — L609 Nail disorder, unspecified: Secondary | ICD-10-CM

## 2013-11-30 LAB — T4, FREE: Free T4: 0.83 ng/dL (ref 0.60–1.60)

## 2013-11-30 LAB — TSH: TSH: 1.42 u[IU]/mL (ref 0.35–4.50)

## 2013-11-30 NOTE — Progress Notes (Signed)
Pre visit review using our clinic review tool, if applicable. No additional management support is needed unless otherwise documented below in the visit note. 

## 2013-11-30 NOTE — Progress Notes (Signed)
No chief complaint on file.   HPI:  Acute visit for:  Finger nail deformity: splitting of nails -in pool some this summer -wants to check thyroid -denies: other skin changes, weight changes, heat or cold intol  ROS: See pertinent positives and negatives per HPI.  Past Medical History  Diagnosis Date  . Hypertension   . Arthritis   . Chicken pox   . Hypertension     Past Surgical History  Procedure Laterality Date  . Knee surgery  2000  . Cartilage removal      Family History  Problem Relation Age of Onset  . Cancer Mother     lung and soft tissue cancer from radiation  . Heart disease Father     CHF  . Diabetes Father   . Heart disease Maternal Grandfather   . Diabetes Paternal Grandmother     History   Social History  . Marital Status: Married    Spouse Name: N/A    Number of Children: N/A  . Years of Education: N/A   Social History Main Topics  . Smoking status: Former Smoker    Quit date: 07/16/1992  . Smokeless tobacco: None  . Alcohol Use: Yes     Comment: very little per patient   . Drug Use: No  . Sexual Activity: None   Other Topics Concern  . 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: living with and caring for wife whom had stroke and brain tumor      Spiritual Beliefs: Methodist      Lifestyle: no regular exercise, diet poor             Current outpatient prescriptions:cyclobenzaprine (FLEXERIL) 10 MG tablet, Take 1 tablet (10 mg total) by mouth 3 (three) times daily as needed for muscle spasms., Disp: 20 tablet, Rfl: 2;  hydrochlorothiazide (HYDRODIURIL) 25 MG tablet, TAKE 1 TABLET (25 MG TOTAL) BY MOUTH DAILY., Disp: 90 tablet, Rfl: 0;  lisinopril (PRINIVIL,ZESTRIL) 5 MG tablet, TAKE 1 TABLET (5 MG TOTAL) BY MOUTH DAILY., Disp: 90 tablet, Rfl: 0 meloxicam (MOBIC) 7.5 MG tablet, Take 7.5 mg by mouth daily., Disp: , Rfl: ;  Multiple Vitamins-Minerals  (MULTIVITAMIN WITH MINERALS) tablet, Take 1 tablet by mouth daily., Disp: , Rfl:   EXAM:  Filed Vitals:   11/30/13 1301  BP: 126/82  Pulse: 75  Temp: 98 F (36.7 C)    Body mass index is 49.8 kg/(m^2).  GENERAL: vitals reviewed and listed above, alert, oriented, appears well hydrated and in no acute distress  HEENT: atraumatic, conjunttiva clear, no obvious abnormalities on inspection of external nose and ears  NECK: no obvious masses on inspection  SKIN: very minimal splitting of ends of some nails - no pitting or ridging  MS: moves all extremities without noticeable abnormality  PSYCH: pleasant and cooperative, no obvious depression or anxiety  ASSESSMENT AND PLAN:  Discussed the following assessment and plan:  Nail deformity - Plan: TSH, T4, Free  -discussed keeping hands out of water, keeping nails trim, no biting, etc -Pubmed search found article advising 2.5mg  biotin daily has sig effect on brittle nails and seems safe - discussed with pt upper limit for safety not defined, potential for adverse effects, and issues with quality and dosing in supplements -thyroid check per his request -Patient advised to return or notify a doctor immediately if symptoms worsen or persist or new concerns arise.  Patient Instructions  -We have ordered labs or studies at this visit. It can take up to 1-2 weeks for results and processing. We will contact you with instructions IF your results are abnormal. Normal results will be released to your [facility]. If you have not heard from [name] or can not find your results in [facility] in 2 weeks please contact our office.            James Benton R.

## 2013-11-30 NOTE — Patient Instructions (Signed)
-  We have ordered labs or studies at this visit. It can take up to 1-2 weeks for results and processing. We will contact you with instructions IF your results are abnormal. Normal results will be released to your MYCHART. If you have not heard from us or can not find your results in MYCHART in 2 weeks please contact our office.        

## 2014-03-15 ENCOUNTER — Other Ambulatory Visit: Payer: Self-pay | Admitting: Family Medicine

## 2014-04-11 ENCOUNTER — Other Ambulatory Visit: Payer: Self-pay | Admitting: Family Medicine

## 2014-05-03 ENCOUNTER — Other Ambulatory Visit: Payer: Self-pay | Admitting: *Deleted

## 2014-05-03 MED ORDER — LISINOPRIL 5 MG PO TABS: ORAL_TABLET | ORAL | Status: DC

## 2014-05-18 ENCOUNTER — Other Ambulatory Visit: Payer: Self-pay | Admitting: Family Medicine

## 2014-06-14 ENCOUNTER — Encounter: Payer: Self-pay | Admitting: Family Medicine

## 2014-06-14 ENCOUNTER — Ambulatory Visit (INDEPENDENT_AMBULATORY_CARE_PROVIDER_SITE_OTHER): Payer: 59 | Admitting: Family Medicine

## 2014-06-14 DIAGNOSIS — I10 Essential (primary) hypertension: Secondary | ICD-10-CM

## 2014-06-14 MED ORDER — HYDROCHLOROTHIAZIDE 25 MG PO TABS: 25.0000 mg | ORAL_TABLET | Freq: Every day | ORAL | Status: DC

## 2014-06-14 MED ORDER — LISINOPRIL 5 MG PO TABS: ORAL_TABLET | ORAL | Status: DC

## 2014-06-14 NOTE — Progress Notes (Addendum)
HPI:  HTN:/Obesity: -meds: ASA, lisinopril, HCTZ -Lifestyle: no regular exercise, diet ok -denies: CP, SOB, DOE, swelling, palpitations  HM: flu  and shingles vaccines offered  ROS: See pertinent positives and negatives per HPI.  Past Medical History  Diagnosis Date  . Hypertension   . Arthritis   . Chicken pox   . Hypertension     Past Surgical History  Procedure Laterality Date  . Knee surgery  2000  . Cartilage removal      Family History  Problem Relation Age of Onset  . Cancer Mother     lung and soft tissue cancer from radiation  . Heart disease Father     CHF  . Diabetes Father   . Heart disease Maternal Grandfather   . Diabetes Paternal Grandmother     History   Social History  . Marital Status: Married    Spouse Name: N/A    Number of Children: N/A  . Years of Education: N/A   Social History Main Topics  . Smoking status: Former Smoker    Quit date: 07/16/1992  . Smokeless tobacco: None  . Alcohol Use: Yes     Comment: very little per patient   . Drug Use: No  . Sexual Activity: None   Other Topics Concern  . 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  cyclobenzaprine (FLEXERIL) 10 MG tablet, TAKE 1 TABLET (10 MG TOTAL) BY MOUTH 3 (THREE) TIMES DAILY AS NEEDED FOR MUSCLE SPASMS., Disp: 20 tablet, Rfl: 1 .  hydrochlorothiazide (HYDRODIURIL) 25 MG tablet, Take 1 tablet (25 mg total) by mouth daily., Disp: 90 tablet, Rfl: 3 .  lisinopril (PRINIVIL,ZESTRIL) 5 MG tablet, TAKE 1 TABLET (5 MG TOTAL) BY MOUTH DAILY., Disp: 90 tablet, Rfl: 3 .  meloxicam (MOBIC) 7.5 MG tablet, Take 7.5 mg by mouth daily., Disp: , Rfl:  .  Multiple Vitamins-Minerals (MULTIVITAMIN WITH MINERALS)  tablet, Take 1 tablet by mouth daily., Disp: , Rfl:   EXAM:  Filed Vitals:   06/14/14 0857  BP: 124/86  Pulse: 80  Temp: 97.5 F (36.4 C)    Body mass index is 53.93 kg/(m^2).  GENERAL: vitals reviewed and listed above, alert, oriented, appears well hydrated and in no acute distress  HEENT: atraumatic, conjunttiva clear, no obvious abnormalities on inspection of external nose and ears  NECK: no obvious masses on inspection  LUNGS: clear to auscultation bilaterally, no wheezes, rales or rhonchi, good air movement  CV: HRRR, no peripheral edema  MS: moves all extremities without noticeable abnormality  PSYCH: pleasant and cooperative, no obvious depression or anxiety  ASSESSMENT AND PLAN:  Discussed the following assessment and plan:  OBESITY, MORBID  Essential hypertension - Plan: hydrochlorothiazide (HYDRODIURIL) 25 MG tablet, lisinopril (PRINIVIL,ZESTRIL) 5 MG tablet  -refilled meds -discussed risks with NSAIDS and he feels not helping much and may discuss with his orthopedic doc -physical this spring -had flu vaccine this year, he wants to check on cost of shingles vaccine -Patient advised to return or notify a doctor immediately if symptoms worsen or persist or new concerns arise.  Patient Instructions  We recommend  the following healthy lifestyle measures: - eat a healthy diet consisting of lots of vegetables, fruits, beans, nuts, seeds, healthy meats such as white chicken and fish and whole grains.  - avoid fried foods, fast food, processed foods, sodas, red meet and other fattening foods.  - get a least 150 minutes of aerobic exercise per week.   Check on the cost of shingles     James Pflum R.

## 2014-06-14 NOTE — Progress Notes (Signed)
Pre visit review using our clinic review tool, if applicable. No additional management support is needed unless otherwise documented below in the visit note. 

## 2014-06-14 NOTE — Patient Instructions (Addendum)
We recommend the following healthy lifestyle measures: - eat a healthy diet consisting of lots of vegetables, fruits, beans, nuts, seeds, healthy meats such as white chicken and fish and whole grains.  - avoid fried foods, fast food, processed foods, sodas, red meet and other fattening foods.  - get a least 150 minutes of aerobic exercise per week.   Check on the cost of shingles

## 2014-07-20 ENCOUNTER — Ambulatory Visit (INDEPENDENT_AMBULATORY_CARE_PROVIDER_SITE_OTHER): Payer: 59 | Admitting: Family Medicine

## 2014-07-20 ENCOUNTER — Encounter: Payer: Self-pay | Admitting: Family Medicine

## 2014-07-20 VITALS — BP 122/76 | HR 83 | Temp 98.0°F | Ht 65.0 in | Wt 333.5 lb

## 2014-07-20 DIAGNOSIS — E785 Hyperlipidemia, unspecified: Secondary | ICD-10-CM

## 2014-07-20 DIAGNOSIS — I10 Essential (primary) hypertension: Secondary | ICD-10-CM

## 2014-07-20 DIAGNOSIS — Z0000 Encounter for general adult medical examination without abnormal findings: Secondary | ICD-10-CM

## 2014-07-20 LAB — BASIC METABOLIC PANEL
BUN: 21 mg/dL (ref 6–23)
CO2: 33 mEq/L — ABNORMAL HIGH (ref 19–32)
Calcium: 9.2 mg/dL (ref 8.4–10.5)
Chloride: 100 mEq/L (ref 96–112)
Creatinine, Ser: 0.93 mg/dL (ref 0.40–1.50)
GFR: 87.91 mL/min (ref >60.00)
Glucose, Bld: 97 mg/dL (ref 70–99)
Potassium: 3.9 mEq/L (ref 3.5–5.1)
Sodium: 136 mEq/L (ref 135–145)

## 2014-07-20 LAB — LIPID PANEL
Cholesterol: 164 mg/dL (ref 0–200)
HDL: 52.8 mg/dL (ref >39.00)
LDL Cholesterol: 99 mg/dL (ref 0–99)
NonHDL: 111.2
Total CHOL/HDL Ratio: 3
Triglycerides: 59 mg/dL (ref 0.0–149.0)
VLDL: 11.8 mg/dL (ref 0.0–40.0)

## 2014-07-20 NOTE — Patient Instructions (Addendum)
BEFORE YOU LEAVE: -stool cards -labs  Try changing to naproxen (aleve) INSTEAD of meloxicam and Tylenol 1000mg  up to 3 times per day instead of an anti-inflammatory medication if possible  We recommend the following healthy lifestyle measures: - eat a healthy diet consisting of lots of vegetables, fruits, beans, nuts, seeds, healthy meats such as white chicken and fish and whole grains.  - avoid fried foods, fast food, processed foods, sodas, red meet and other fattening foods.  - get a least 150 minutes of aerobic exercise per week.

## 2014-07-20 NOTE — Progress Notes (Signed)
Pre visit review using our clinic review tool, if applicable. No additional management support is needed unless otherwise documented below in the visit note. 

## 2014-07-20 NOTE — Progress Notes (Signed)
HPI:  Here for CPE:  -Concerns and/or follow up today:   HTN:/Obesity: -meds:lisinopril, HCTZ -Lifestyle: no regular exercise, diet ok -denies: CP, SOB, DOE, swelling, palpitations  Hip OA: -seeing ortho -reports taking NSAIDs for this, reports told to treat medically for now unless loses weight then they may consider hip replacement -denies: GI bleeding, stomach upset, worsening, falls  -Diet: no good lately  -Exercise: no regular exercise  -Diabetes and Dyslipidemia Screening: FASTING for labs today  -Hx of HTN: treated  -Vaccines: UTD except those refused  -sexual activity: yes, male partner, no new partners  -wants STI testing, Hep C screening (if born 22-1967): no  -FH colon or prstate ca: see FH Last colon cancer screening: declined colonoscopy, stool cards given Last prostate ca screening: discussed per [facility], declined  -Alcohol, Tobacco, drug use: see social history  Review of Systems - no fevers, unintentional weight loss, vision loss, hearing loss, chest pain, sob, hemoptysis, melena, hematochezia, hematuria, genital discharge, changing or concerning skin lesions, bleeding, bruising, loc, thoughts of self harm or SI  Past Medical History  Diagnosis Date  . Hypertension   . Arthritis   . Chicken pox   . Hypertension     Past Surgical History  Procedure Laterality Date  . Knee surgery  2000  . Cartilage removal      Family History  Problem Relation Age of Onset  . Cancer Mother     lung and soft tissue cancer from radiation  . Heart disease Father     CHF  . Diabetes Father   . Heart disease Maternal Grandfather   . Diabetes Paternal Grandmother     History   Social History  . Marital Status: Married    Spouse Name: N/A  . Number of Children: N/A  . Years of Education: N/A   Social History Main Topics  . Smoking status: Former Smoker    Quit date: 07/16/1992  . Smokeless tobacco: Not on file  . Alcohol Use: Yes     Comment: very  little per patient   . Drug Use: No  . Sexual Activity: Not on file   Other Topics Concern  . 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  cyclobenzaprine (FLEXERIL) 10 MG tablet, TAKE 1 TABLET (10 MG TOTAL) BY MOUTH 3 (THREE) TIMES DAILY AS NEEDED FOR MUSCLE SPASMS., Disp: 20 tablet, Rfl: 1 .  hydrochlorothiazide (HYDRODIURIL) 25 MG tablet, Take 1 tablet (25 mg total) by mouth daily., Disp: 90 tablet, Rfl: 3 .  lisinopril (PRINIVIL,ZESTRIL) 5 MG tablet, TAKE 1 TABLET (5 MG TOTAL) BY MOUTH DAILY., Disp: 90 tablet, Rfl: 3 .  meloxicam (MOBIC) 7.5 MG tablet, Take 7.5 mg by mouth daily., Disp: , Rfl:  .  Multiple Vitamins-Minerals (MULTIVITAMIN WITH MINERALS) tablet, Take 1 tablet by mouth daily., Disp: , Rfl:   EXAM:  Filed Vitals:   07/20/14 0940  BP: 122/76  Pulse: 83  Temp: 98 F (36.7 C)  TempSrc: Oral  Height: 5\' 5"  (1.651 m)  Weight: 333 lb 8 oz (151.275 kg)    Estimated body mass index is 55.5 kg/(m^2) as  calculated from the following:   Height as of this encounter: 5\' 5"  (1.651 m).   Weight as of this encounter: 333 lb 8 oz (151.275 kg).  GENERAL: vitals reviewed and listed below, alert, oriented, appears well hydrated and in no acute distress  HEENT: head atraumatic, PERRLA, normal appearance of eyes, ears, nose and mouth. moist mucus membranes.  NECK: supple, no masses or lymphadenopathy  LUNGS: clear to auscultation bilaterally, no rales, rhonchi or wheeze  CV: HRRR, no peripheral edema or cyanosis, normal pedal pulses  ABDOMEN: bowel sounds normal, soft, non tender to palpation, no masses, no rebound or guarding  GU: declined  RECTAL: declined  SKIN: no rash or abnormal  lesions  MS: normal gait, moves all extremities normally  NEURO: CN II-XII grossly intact, normal muscle strength and sensation to light touch on extremities  PSYCH: normal affect, pleasant and cooperative  ASSESSMENT AND PLAN:  Discussed the following assessment and plan:  Visit for preventive health examination  OBESITY, MORBID  Essential hypertension - Plan: Basic metabolic panel  Hyperlipidemia, mild - Plan: Lipid panel  -Discussed and advised all [name] preventive services health task force level A and B recommendations for age, sex and risks.  -Advised at least 150 minutes of exercise per week and a healthy diet low in saturated fats and sweets and consisting of fresh fruits and vegetables, lean meats such as fish and white chicken and whole grains.  -FASTING labs, studies and vaccines per orders this encounter  -counseling on obesity - discussed lifestyle, medication, surgical options  Patient advised to return to clinic immediately if symptoms worsen or persist or new concerns.  Patient Instructions  BEFORE YOU LEAVE: -stool cards -labs  Try changing to naproxen (aleve) INSTEAD of meloxicam and Tylenol 1000mg  up to 3 times per day instead of an anti-inflammatory medication if possible  We recommend the following healthy lifestyle measures: - eat a healthy diet consisting of lots of vegetables, fruits, beans, nuts, seeds, healthy meats such as white chicken and fish and whole grains.  - avoid fried foods, fast food, processed foods, sodas, red meet and other fattening foods.  - get a least 150 minutes of aerobic exercise per week.        No Follow-up on file.   Colin Benton R.

## 2015-05-10 ENCOUNTER — Other Ambulatory Visit: Payer: Self-pay | Admitting: Family Medicine

## 2015-05-16 ENCOUNTER — Ambulatory Visit: Payer: Self-pay | Admitting: Family Medicine

## 2015-05-17 ENCOUNTER — Ambulatory Visit (INDEPENDENT_AMBULATORY_CARE_PROVIDER_SITE_OTHER): Payer: BLUE CROSS/BLUE SHIELD | Admitting: Family Medicine

## 2015-05-17 ENCOUNTER — Encounter: Payer: Self-pay | Admitting: Family Medicine

## 2015-05-17 VITALS — BP 108/70 | HR 91 | Temp 98.1°F | Ht 65.0 in | Wt 326.5 lb

## 2015-05-17 DIAGNOSIS — M545 Low back pain: Secondary | ICD-10-CM | POA: Diagnosis not present

## 2015-05-17 DIAGNOSIS — I10 Essential (primary) hypertension: Secondary | ICD-10-CM

## 2015-05-17 DIAGNOSIS — M1990 Unspecified osteoarthritis, unspecified site: Secondary | ICD-10-CM | POA: Diagnosis not present

## 2015-05-17 DIAGNOSIS — Z23 Encounter for immunization: Secondary | ICD-10-CM | POA: Diagnosis not present

## 2015-05-17 MED ORDER — CYCLOBENZAPRINE HCL 10 MG PO TABS: 10.0000 mg | ORAL_TABLET | Freq: Three times a day (TID) | ORAL | Status: DC | PRN

## 2015-05-17 NOTE — Addendum Note (Signed)
Addended by: Agnes Lawrence on: 05/17/2015 03:20 PM   Modules accepted: Orders

## 2015-05-17 NOTE — Progress Notes (Signed)
HPI:  James [name] is a pleasant 60 year old male here for a follow up/med check visit. He had his physical in 07/2014.  HTN:/Obesity: -meds:lisinopril, HCTZ -Lifestyle: working on diet and has lost 14 lbs in last month -denies: CP, SOB, DOE, swelling, palpitations  Hip OA/Chronic knee, back and hip pain: -seeing ortho -reports told to treat medically for now unless loses weight then they may consider hip replacement -only thing that helps is tylenol and flexeril on bad days -denies: GI bleeding, stomach upset, worsening, falls   ROS: See pertinent positives and negatives per HPI.  Past Medical History  Diagnosis Date  . Hypertension   . Arthritis   . Chicken pox   . Hypertension     Past Surgical History  Procedure Laterality Date  . Knee surgery  2000  . Cartilage removal      Family History  Problem Relation Age of Onset  . Cancer Mother     lung and soft tissue cancer from radiation  . Heart disease Father     CHF  . Diabetes Father   . Heart disease Maternal Grandfather   . Diabetes Paternal Grandmother     Social History   Social History  . Marital Status: Married    Spouse Name: N/A  . Number of Children: N/A  . Years of Education: N/A   Social History Main Topics  . Smoking status: Former Smoker    Quit date: 07/16/1992  . Smokeless tobacco: None  . Alcohol Use: Yes     Comment: very little per patient   . Drug Use: No  . Sexual Activity: Not Asked   Other Topics Concern  . 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  cyclobenzaprine (FLEXERIL) 10 MG tablet, Take 1 tablet (10 mg total) by mouth 3 (three) times daily as needed for muscle spasms., Disp: 30 tablet,  Rfl: 0 .  hydrochlorothiazide (HYDRODIURIL) 25 MG tablet, Take 1 tablet (25 mg total) by mouth daily., Disp: 90 tablet, Rfl: 3 .  lisinopril (PRINIVIL,ZESTRIL) 5 MG tablet, TAKE 1 TABLET (5 MG TOTAL) BY MOUTH DAILY., Disp: 90 tablet, Rfl: 3 .  meloxicam (MOBIC) 7.5 MG tablet, Take 7.5 mg by mouth daily., Disp: , Rfl:  .  Multiple Vitamins-Minerals (MULTIVITAMIN WITH MINERALS) tablet, Take 1 tablet by mouth daily., Disp: , Rfl:   EXAM:  Filed Vitals:   05/17/15 1250  BP: 108/70  Pulse: 91  Temp: 98.1 F (36.7 C)    Body mass index is 54.33 kg/(m^2).  GENERAL: vitals reviewed and listed above, alert, oriented, appears well hydrated and in no acute distress  HEENT: atraumatic, conjunttiva clear, no obvious abnormalities on inspection of external nose and ears  NECK: no obvious masses on inspection  LUNGS: clear to auscultation bilaterally, no wheezes, rales or rhonchi, good air movement  CV: HRRR, no peripheral edema  MS: moves all extremities without noticeable abnormality  PSYCH: pleasant and cooperative, no obvious depression or anxiety  ASSESSMENT AND PLAN:  Discussed the following assessment and plan:  Essential hypertension  Morbid obesity, unspecified obesity type (HCC)  Osteoarthritis, unspecified  osteoarthritis type, unspecified site  Low back pain, unspecified back pain laterality, with sciatica presence unspecified  -flexeril ok to use prn for spasm, tylenol safe dosing discussed for his OA pain, weight loss will likely help and congratulated him on these efforts -cont antihypertensives -CPE with labs in 3-4 months -vaccines and HM reviewed -Patient advised to return or notify a doctor immediately if symptoms worsen or persist or new concerns arise.  Patient Instructions  BEFORE YOU LEAVE: -flu and tetanus vaccines -physical exam in 3-4 months - plan to do morning visit if possible with fasting labs that day - OK TO DRINK black coffee and please drink  PLENTY OF WATER (no food for 8 hours prior).  Tylenol [phone] mg up to 3 times per day ok for pain.  Congratulations on lifestyle changes!  We recommend the following healthy lifestyle measures: - eat a healthy whole foods diet consisting of regular small meals composed of vegetables, fruits, beans, nuts, seeds, healthy meats such as white chicken and fish and whole grains.  - avoid sweets, white starchy foods, fried foods, fast food, processed foods, sodas, red meet and other fattening foods.  - get a least 150-300 minutes of aerobic exercise per week.       Colin Benton R.

## 2015-05-17 NOTE — Patient Instructions (Signed)
BEFORE YOU LEAVE: -flu and tetanus vaccines -physical exam in 3-4 months - plan to do morning visit if possible with fasting labs that day - OK TO DRINK black coffee and please drink PLENTY OF WATER (no food for 8 hours prior).  Tylenol [phone] mg up to 3 times per day ok for pain.  Congratulations on lifestyle changes!  We recommend the following healthy lifestyle measures: - eat a healthy whole foods diet consisting of regular small meals composed of vegetables, fruits, beans, nuts, seeds, healthy meats such as white chicken and fish and whole grains.  - avoid sweets, white starchy foods, fried foods, fast food, processed foods, sodas, red meet and other fattening foods.  - get a least 150-300 minutes of aerobic exercise per week.

## 2015-05-17 NOTE — Progress Notes (Signed)
Pre visit review using our clinic review tool, if applicable. No additional management support is needed unless otherwise documented below in the visit note. 

## 2015-05-24 ENCOUNTER — Encounter: Payer: Self-pay | Admitting: *Deleted

## 2015-06-10 ENCOUNTER — Other Ambulatory Visit: Payer: Self-pay | Admitting: Family Medicine

## 2015-08-11 ENCOUNTER — Other Ambulatory Visit: Payer: Self-pay | Admitting: Family Medicine

## 2015-09-13 ENCOUNTER — Encounter: Payer: Self-pay | Admitting: Family Medicine

## 2015-09-13 ENCOUNTER — Ambulatory Visit (INDEPENDENT_AMBULATORY_CARE_PROVIDER_SITE_OTHER): Payer: BLUE CROSS/BLUE SHIELD | Admitting: Family Medicine

## 2015-09-13 ENCOUNTER — Telehealth: Payer: Self-pay | Admitting: Family Medicine

## 2015-09-13 VITALS — BP 108/78 | HR 87 | Temp 97.9°F | Ht 65.0 in | Wt 306.7 lb

## 2015-09-13 DIAGNOSIS — Z0000 Encounter for general adult medical examination without abnormal findings: Secondary | ICD-10-CM

## 2015-09-13 DIAGNOSIS — I10 Essential (primary) hypertension: Secondary | ICD-10-CM

## 2015-09-13 LAB — CBC WITH DIFFERENTIAL/PLATELET
Basophils Absolute: 0 10*3/uL (ref 0.0–0.1)
Basophils Relative: 0.5 % (ref 0.0–3.0)
Eosinophils Absolute: 0 10*3/uL (ref 0.0–0.7)
Eosinophils Relative: 0.4 % (ref 0.0–5.0)
HCT: 44 % (ref 39.0–52.0)
Hemoglobin: 14.8 g/dL (ref 13.0–17.0)
Lymphocytes Relative: 25.7 % (ref 12.0–46.0)
Lymphs Abs: 1.6 10*3/uL (ref 0.7–4.0)
MCHC: 33.6 g/dL (ref 30.0–36.0)
MCV: 88.7 fl (ref 78.0–100.0)
Monocytes Absolute: 0.5 10*3/uL (ref 0.1–1.0)
Monocytes Relative: 7.6 % (ref 3.0–12.0)
Neutro Abs: 4.2 10*3/uL (ref 1.4–7.7)
Neutrophils Relative %: 65.8 % (ref 43.0–77.0)
Platelets: 200 10*3/uL (ref 150.0–400.0)
RBC: 4.96 Mil/uL (ref 4.22–5.81)
RDW: 14.4 % (ref 11.5–15.5)
WBC: 6.4 10*3/uL (ref 4.0–10.5)

## 2015-09-13 LAB — BASIC METABOLIC PANEL
BUN: 23 mg/dL (ref 6–23)
CO2: 29 mEq/L (ref 19–32)
Calcium: 9.4 mg/dL (ref 8.4–10.5)
Chloride: 102 mEq/L (ref 96–112)
Creatinine, Ser: 0.84 mg/dL (ref 0.40–1.50)
GFR: 98.49 mL/min (ref >60.00)
Glucose, Bld: 108 mg/dL — ABNORMAL HIGH (ref 70–99)
Potassium: 4.2 mEq/L (ref 3.5–5.1)
Sodium: 140 mEq/L (ref 135–145)

## 2015-09-13 LAB — LIPID PANEL
Cholesterol: 192 mg/dL (ref 0–200)
HDL: 54.9 mg/dL (ref >39.00)
LDL Cholesterol: 122 mg/dL — ABNORMAL HIGH (ref 0–99)
NonHDL: 136.68
Total CHOL/HDL Ratio: 3
Triglycerides: 71 mg/dL (ref 0.0–149.0)
VLDL: 14.2 mg/dL (ref 0.0–40.0)

## 2015-09-13 LAB — HEMOGLOBIN A1C: Hgb A1c MFr Bld: 5.9 % (ref 4.6–6.5)

## 2015-09-13 NOTE — Patient Instructions (Signed)
BEFORE YOU LEAVE: -cologuard information, stool cards in case does not do cologuard -labs -follow up in 6 months  Please your insurance company to check on Cologuard for colon cancer screening. Please call us later this week to let us know if you want [name] to order this for you. If not, please complete stool cards.  We recommend the following healthy lifestyle measures: - eat a healthy whole foods diet consisting of regular small meals composed of vegetables, fruits, beans, nuts, seeds, healthy meats such as white chicken and fish and whole grains.  - avoid sweets, white starchy foods, fried foods, fast food, processed foods, sodas, red meet and other fattening foods.  - get a least 150-300 minutes of aerobic exercise per week.   -We have ordered labs or studies at this visit. It can take up to 1-2 weeks for results and processing. We will contact you with instructions IF your results are abnormal. Normal results will be released to your [facility]. If you have not heard from [name] or can not find your results in [facility] in 2 weeks please contact our office.

## 2015-09-13 NOTE — Telephone Encounter (Signed)
Pt said he contacted his insurance company and they will pay for Colon guard

## 2015-09-13 NOTE — Progress Notes (Signed)
Pre visit review using our clinic review tool, if applicable. No additional management support is needed unless otherwise documented below in the visit note. 

## 2015-09-13 NOTE — Progress Notes (Signed)
HPI:  James [name] is a pleasant 60 year old male here for an annual physical exam. Chronic conditions include:  HTN:/Obesity: -meds:lisinopril, HCTZ -Lifestyle: working on diet -adkins; not currently exercising, but considering [facility] -denies: CP, SOB, DOE, swelling, palpitations  Hip OA/Chronic knee, back and hip pain: -seeing ortho -reports told to treat medically for now unless loses weight (goal 240lbs) then they may consider hip replacement -only thing that helps is tylenol and flexeril on bad days -denies: GI bleeding, stomach upset, worsening, falls  -Diet: adkins, reports is eating a lot of vegetables  -Exercise: no regular exercise  -Diabetes and Dyslipidemia Screening: FASTING for labs today  -Hx of HTN: no  -Vaccines: UTD  -sexual activity: yes, male partner, no new partners  -wants STI testing, Hep C screening (if born 48-1967): no  -FH colon or prstate ca: see FH Last colon cancer screening: has not done, does not want to do colonoscopy, interested in cologuard but wants to check on cost with insurance company, did not complete stool cards we have given him in the past Last prostate ca screening: declined  -Alcohol, Tobacco, drug use: see social history  Review of Systems - no fevers, unintentional weight loss, vision loss, hearing loss, chest pain, sob, hemoptysis, melena, hematochezia, hematuria, genital discharge, changing or concerning skin lesions, bleeding, bruising, loc, thoughts of self harm or SI  Past Medical History  Diagnosis Date  . Hypertension   . Arthritis   . Chicken pox   . Hypertension     Past Surgical History  Procedure Laterality Date  . Knee surgery  2000  . Cartilage removal      Family History  Problem Relation Age of Onset  . Cancer Mother     lung and soft tissue cancer from radiation  . Heart disease Father     CHF  . Diabetes Father   . Heart disease Maternal Grandfather   . Diabetes  Paternal Grandmother     Social History   Social History  . Marital Status: Married    Spouse Name: N/A  . Number of Children: N/A  . Years of Education: N/A   Social History Main Topics  . Smoking status: Former Smoker    Quit date: 07/16/1992  . Smokeless tobacco: None  . Alcohol Use: Yes     Comment: very little per patient   . Drug Use: No  . Sexual Activity: Not Asked   Other Topics Concern  . 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  Acetaminophen (TYLENOL PO), Take by mouth., Disp: , Rfl:  .  cyclobenzaprine (FLEXERIL) 10 MG tablet, Take 1 tablet (10 mg total) by mouth 3 (three) times daily as needed for muscle spasms., Disp: 30 tablet, Rfl: 0 .  hydrochlorothiazide (HYDRODIURIL) 25 MG tablet, TAKE 1 TABLET (25 MG TOTAL) BY MOUTH DAILY., Disp: 90 tablet, Rfl: 1 .  lisinopril (PRINIVIL,ZESTRIL) 5 MG tablet, TAKE 1 TABLET (5 MG TOTAL) BY MOUTH DAILY., Disp: 90 tablet, Rfl: 1 .  Multiple Vitamins-Minerals (MULTIVITAMIN WITH MINERALS) tablet, Take 1 tablet by mouth daily., Disp: , Rfl:  EXAM:  Filed Vitals:   09/13/15 0830  BP: 108/78  Pulse: 87  Temp: 97.9 F (36.6 C)  TempSrc: Oral  Height: 5\' 5"  (1.651 m)  Weight: 306 lb 11.2 oz (139.118 kg)    Estimated body mass index is 51.04 kg/(m^2) as calculated from the following:   Height as of this encounter: 5\' 5"  (1.651 m).   Weight as of this encounter: 306 lb 11.2 oz (139.118 kg).  GENERAL: vitals reviewed and listed below, alert, oriented, appears well hydrated and in no acute distress  HEENT: head atraumatic, PERRLA, normal appearance of eyes, ears, nose and mouth. moist mucus membranes.  NECK: supple, no masses or  lymphadenopathy  LUNGS: clear to auscultation bilaterally, no rales, rhonchi or wheeze  CV: HRRR, no peripheral edema or cyanosis, normal pedal pulses  ABDOMEN: bowel sounds normal, soft, non tender to palpation, no masses, no rebound or guarding  GU: declined full exam  SKIN: no rash or abnormal lesions; declined unclothed skin exam  MS: normal gait, moves all extremities normally  NEURO: CN II-XII grossly intact, normal muscle strength and sensation to light touch on extremities  PSYCH: normal affect, pleasant and cooperative  ASSESSMENT AND PLAN:  Discussed the following assessment and plan:  Visit for preventive health examination - Plan: Hemoglobin A1c, Lipid Panel  Essential hypertension - Plan: Basic metabolic panel, CBC with Differential  Morbid obesity, unspecified obesity type ([place])  -Discussed and advised all [name] preventive services health task force level A and B recommendations for age, sex and risks.  -Advised at least 150 minutes of exercise per week and a healthy diet low in saturated fats and sweets and consisting of fresh fruits and vegetables, lean meats such as fish and white chicken and whole grains.  -FASTING labs, studies and vaccines per orders this encounter  -advised colon cancer screening, he plans to check on Cologuard or do the stool cards  -Lifestyle recommendations discussed at length and did advise increasing his activity level, decreasing fatty foods and increasing vegetables and healthy lean meats such as fish and white poultry   Patient advised to return to clinic immediately if symptoms worsen or persist or new concerns.  Patient Instructions  BEFORE YOU LEAVE: -cologuard information, stool cards in case does not do cologuard -labs -follow up in 6 months  Please your insurance company to check on Cologuard for colon cancer screening. Please call us later this week to let us know if you want [name] to order this for you. If not, please  complete stool cards.  We recommend the following healthy lifestyle measures: - eat a healthy whole foods diet consisting of regular small meals composed of vegetables, fruits, beans, nuts, seeds, healthy meats such as white chicken and fish and whole grains.  - avoid sweets, white starchy foods, fried foods, fast food, processed foods, sodas, red meet and other fattening foods.  - get a least 150-300 minutes of aerobic exercise per week.   -We have ordered labs or studies at this visit. It can take up to 1-2 weeks for results and processing. We will contact you with instructions IF your results are abnormal. Normal results will be released to your [facility]. If you have not heard from [name] or can not find your results in [facility] in 2 weeks please contact our office.             No Follow-up on file.   Colin Benton R.

## 2015-09-13 NOTE — Telephone Encounter (Signed)
I called the pt and advised him the orders were completed and faxed to [facility] for the Cologuard test and they will contact him and he agreed.

## 2015-09-23 LAB — COLOGUARD: Cologuard: NEGATIVE

## 2015-10-10 ENCOUNTER — Encounter: Payer: Self-pay | Admitting: *Deleted

## 2015-10-11 ENCOUNTER — Telehealth: Payer: Self-pay | Admitting: Family Medicine

## 2015-10-11 ENCOUNTER — Encounter: Payer: Self-pay | Admitting: Family Medicine

## 2015-10-11 NOTE — Telephone Encounter (Signed)
Pt returned your call did not elaborate on the nature of the call.

## 2015-10-11 NOTE — Telephone Encounter (Signed)
Patient called back and I informed him the Cologuard test was negative.

## 2015-11-06 ENCOUNTER — Other Ambulatory Visit: Payer: Self-pay | Admitting: Family Medicine

## 2015-11-10 ENCOUNTER — Other Ambulatory Visit: Payer: Self-pay | Admitting: Family Medicine

## 2015-11-10 ENCOUNTER — Telehealth: Payer: Self-pay | Admitting: Family Medicine

## 2015-11-11 MED ORDER — CYCLOBENZAPRINE HCL 10 MG PO TABS: 10.0000 mg | ORAL_TABLET | Freq: Three times a day (TID) | ORAL | Status: DC | PRN

## 2015-11-11 NOTE — Telephone Encounter (Signed)
I called the pt and he stated he would not come back in to the office for a refill on Flexeril as he was seen in 05/2015 and in May for his physical and he has to pay a copay for his visits and does not feel a visit is necessary for Dr Maudie Mercury to ask him the same three questions.  Message forwarded to Dr Maudie Mercury.

## 2015-11-11 NOTE — Telephone Encounter (Signed)
Pt would like to have a call in reference to his flexeril

## 2015-11-11 NOTE — Telephone Encounter (Signed)
Please call patient. Please let him know I am not sure why the pharmacy told him he needed an appointment. Please let him know that I did not advise that and did just see him so he does not an need appointment. Sent refill.

## 2015-11-11 NOTE — Telephone Encounter (Signed)
I called the pt and informed him of the message below. 

## 2016-01-10 ENCOUNTER — Ambulatory Visit (INDEPENDENT_AMBULATORY_CARE_PROVIDER_SITE_OTHER): Payer: BLUE CROSS/BLUE SHIELD | Admitting: Emergency Medicine

## 2016-01-10 DIAGNOSIS — Z23 Encounter for immunization: Secondary | ICD-10-CM

## 2016-02-02 ENCOUNTER — Encounter: Payer: Self-pay | Admitting: Family Medicine

## 2016-06-18 ENCOUNTER — Other Ambulatory Visit: Payer: Self-pay | Admitting: Family Medicine

## 2016-07-11 ENCOUNTER — Telehealth: Payer: Self-pay | Admitting: Family Medicine

## 2016-07-11 NOTE — Telephone Encounter (Signed)
Pt would like to switch from dr kim to cory. Can I sch? Pt did not elaborate the reason

## 2016-07-12 NOTE — Telephone Encounter (Signed)
Pt has been scheduled.  °

## 2016-07-12 NOTE — Telephone Encounter (Signed)
Please schedule establish care visit

## 2016-07-12 NOTE — Telephone Encounter (Signed)
This is ok with me  

## 2016-07-12 NOTE — Telephone Encounter (Signed)
Is this ok?

## 2016-07-12 NOTE — Telephone Encounter (Signed)
James [name],   He already requested this in the past. His wife see you I believe and wanted him to see you. Nice guy. I'm ok with it if you are?

## 2016-07-17 ENCOUNTER — Ambulatory Visit (INDEPENDENT_AMBULATORY_CARE_PROVIDER_SITE_OTHER): Payer: BLUE CROSS/BLUE SHIELD | Admitting: Adult Health

## 2016-07-17 ENCOUNTER — Encounter: Payer: Self-pay | Admitting: Adult Health

## 2016-07-17 VITALS — BP 166/72 | Temp 97.6°F | Ht 65.0 in | Wt 305.0 lb

## 2016-07-17 DIAGNOSIS — M25552 Pain in left hip: Secondary | ICD-10-CM | POA: Diagnosis not present

## 2016-07-17 DIAGNOSIS — Z7689 Persons encountering health services in other specified circumstances: Secondary | ICD-10-CM

## 2016-07-17 DIAGNOSIS — I10 Essential (primary) hypertension: Secondary | ICD-10-CM | POA: Diagnosis not present

## 2016-07-17 MED ORDER — MELOXICAM 15 MG PO TABS: 15.0000 mg | ORAL_TABLET | Freq: Every day | ORAL | 0 refills | Status: DC | PRN

## 2016-07-17 NOTE — Progress Notes (Signed)
Patient presents to clinic today to establish care. He is a pleasant 61 year old male who  has a past medical history of Arthritis; Chicken pox; Hypertension; and Hypertension.  Acute Concerns: Establish Care   Chronic Issues: Essential Hypertension - Takes lisinopril 10 mg and HCTZ 25 mg. His blood pressure is elevated today, he has not taken his medication in the last two days for unknown reasons. It appears as he has been controlled in the past with his regimen   Left Hip Pain - chronic issue, he needs surgery but is unable to do so, as he has to take care of his wife. He has done PT in the past and would like to do this again as he found out that this was helpful.   Health Maintenance: Dental -- Does not do routine care Vision -- Routine Care  Immunizations -- UTD  Colonoscopy -- Cologuard in  2017 - ws negative  Diet: Poor Exercise: No routine exercise   Past Medical History:  Diagnosis Date  . Arthritis   . Chicken pox   . Hypertension   . Hypertension     Past Surgical History:  Procedure Laterality Date  . cartilage removal    . KNEE SURGERY  2000    Current Outpatient Prescriptions on File Prior to Visit  Medication Sig Dispense Refill  . Acetaminophen (TYLENOL PO) Take by mouth.    . cyclobenzaprine (FLEXERIL) 10 MG tablet Take 1 tablet (10 mg total) by mouth 3 (three) times daily as needed for muscle spasms. 30 tablet 0  . hydrochlorothiazide (HYDRODIURIL) 25 MG tablet TAKE 1 TABLET (25 MG TOTAL) BY MOUTH DAILY. 90 tablet 0  . lisinopril (PRINIVIL,ZESTRIL) 5 MG tablet TAKE 1 TABLET (5 MG TOTAL) BY MOUTH DAILY. 90 tablet 1  . Multiple Vitamins-Minerals (MULTIVITAMIN WITH MINERALS) tablet Take 1 tablet by mouth daily.     No current facility-administered medications on file prior to visit.     No Known Allergies  Family History  Problem Relation Age of Onset  . Cancer Mother     lung and soft tissue cancer from radiation  . Heart disease Father    CHF  . Diabetes Father   . Heart disease Maternal Grandfather   . Diabetes Paternal Grandmother     Social History   Social History  . Marital status: Married    Spouse name: N/A  . Number of children: N/A  . Years of education: N/A   Occupational History  . Not on file.   Social History Main Topics  . Smoking status: Former Smoker    Quit date: 07/16/1992  . Smokeless tobacco: Not on file  . Alcohol use Yes     Comment: very little per patient   . Drug use: No  . Sexual activity: Not on file   Other Topics Concern  . 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   HENT: Negative.   Eyes: Negative.   Respiratory: Negative.   Cardiovascular: Negative.   Gastrointestinal: Negative.   Genitourinary: Negative.   Musculoskeletal: Positive for joint pain.  Skin: Negative.   Neurological: Negative.   Endo/Heme/Allergies: Negative.   Psychiatric/Behavioral: Negative.   All other systems reviewed and are negative.   BP (!) 166/72 (BP Location: Left Arm, Patient Position: Sitting, Cuff Size: Normal)   Temp 97.6 F (36.4 C) (Oral)   Ht 5\' 5"  (1.651 m)   Wt (!) 305 lb (138.3 kg)   BMI 50.75 kg/m   Physical Exam  Constitutional: He is oriented to person, place, and time and well-developed, well-nourished, and in no distress. No distress.  HENT:  Head: Normocephalic and atraumatic.  Right Ear: External ear normal.  Left Ear: External ear normal.  Nose: Nose normal.  Mouth/Throat: Oropharynx is clear and moist. No oropharyngeal exudate.  Eyes: Conjunctivae and EOM are normal. Pupils are equal, round, and reactive to light. Right eye exhibits no discharge. Left eye exhibits no  discharge. No scleral icterus.  Neck: Normal range of motion. Neck supple. No JVD present. No tracheal deviation present. No thyromegaly present.  Cardiovascular: Normal rate, regular rhythm, normal heart sounds and intact distal pulses.  Exam reveals no gallop and no friction rub.   No murmur heard. Pulmonary/Chest: Effort normal and breath sounds normal. No stridor. No respiratory distress. He has no wheezes. He has no rales. He exhibits no tenderness.  Abdominal: Soft. Bowel sounds are normal. He exhibits no distension and no mass. There is no tenderness. There is no rebound and no guarding.  Musculoskeletal: Normal range of motion. He exhibits no edema, tenderness or deformity.  Limping gait - walks in single prong cane due to left hip pain   Lymphadenopathy:    He has no cervical adenopathy.  Neurological: He is alert and oriented to person, place, and time. He exhibits normal muscle tone. GCS score is 15.  Skin: Skin is warm and dry. No rash noted. He is not diaphoretic. No erythema. No pallor.  Psychiatric: Mood, memory, affect and judgment normal.  Nursing note and vitals reviewed.   Assessment/Plan:  1. Encounter to establish care - Follow up in May for CPE or sooner if needed - Follow up sooner if needed - Needs to lose weight   2. Essential hypertension - educated on the importance of taking medication daily.  - Send me results in 1 week via mychart   3. Morbid obesity ([place])  - Amb Ref to Medical Weight Management  4. Left hip pain - will refer to PT and do short course of mobic as he has found both of these helpful in the past - Ambulatory referral to Physical Therapy - meloxicam (MOBIC) 15 MG tablet; Take 1 tablet (15 mg total) by mouth daily as needed for pain.  Dispense: 90 tablet; Refill: 0  Dorothyann Peng, NP

## 2016-07-18 ENCOUNTER — Encounter: Payer: Self-pay | Admitting: Adult Health

## 2016-07-18 ENCOUNTER — Ambulatory Visit: Payer: BLUE CROSS/BLUE SHIELD | Attending: Adult Health

## 2016-07-18 DIAGNOSIS — M25652 Stiffness of left hip, not elsewhere classified: Secondary | ICD-10-CM | POA: Diagnosis present

## 2016-07-18 DIAGNOSIS — M6281 Muscle weakness (generalized): Secondary | ICD-10-CM | POA: Diagnosis present

## 2016-07-18 DIAGNOSIS — R2689 Other abnormalities of gait and mobility: Secondary | ICD-10-CM | POA: Insufficient documentation

## 2016-07-18 DIAGNOSIS — M25552 Pain in left hip: Secondary | ICD-10-CM | POA: Insufficient documentation

## 2016-07-18 NOTE — Therapy (Signed)
[facility] [address], [place], [place], [address] Phone: [phone]   Fax:  [phone]  Physical Therapy Evaluation  Patient Details  Name: James [name] MRN: [id number] Date of Birth: 1955-12-16 Referring Provider: Dorothyann Peng, PA  Encounter Date: 07/18/2016      PT End of Session - 07/18/16 1136    Visit Number 1   Date for PT Re-Evaluation 09/12/16   PT Start Time 1100   PT Stop Time 1140   PT Time Calculation (min) 40 min   Activity Tolerance Patient tolerated treatment well   Behavior During Therapy [facility] for tasks assessed/performed      Past Medical History:  Diagnosis Date  . Arthritis   . Chicken pox   . Hypertension   . Obesity     Past Surgical History:  Procedure Laterality Date  . cartilage removal    . KNEE SURGERY  2000    There were no vitals filed for this visit.       Subjective Assessment - 07/18/16 1106    Subjective Pt presents to PT with 3 year history of hip pain.  Pt reports that he has significant hip OA and MD wants him to lose weight before considering total hip replacement.  Pt is walking with a cane or walker due to pain.     Pertinent History Pt has wife with disability and he cares for her, Lt hip OA-significant   Limitations Standing;Walking   How long can you stand comfortably? Rt>Lt weightbearing 25 minutes   How long can you walk comfortably? using cane or walker, antalgic: limited to <5 minutes   Diagnostic tests Lt hip x-ray: significant OA- 3 years ago   Patient Stated Goals reduce hip pain, improve LE strength, lose weight   Currently in Pain? Yes   Pain Score 4   up to 10/10   Pain Location Hip   Pain Orientation Left   Pain Descriptors / Indicators Aching;Radiating   Pain Type Chronic pain   Pain Radiating Towards Rt calf   Pain Onset More than a month ago   Pain Frequency Constant   Aggravating Factors  lying on Lt side, standing/walking    Pain Relieving Factors ice, nonweightbearing, Tylenol   Effect of Pain on Daily Activities walking limited to <5 minutes, significant Lt hip pain that limits weightbearing function            [facility] PT Assessment - 07/18/16 0001      Assessment   Medical Diagnosis Lt hip pain   Referring Provider Dorothyann Peng, PA   Onset Date/Surgical Date 07/18/13   Next MD Visit 09/2016   Prior Therapy 3 years ago for strength     Precautions   Precautions None     Restrictions   Weight Bearing Restrictions No     Balance Screen   Has the patient fallen in the past 6 months No   Has the patient had a decrease in activity level because of a fear of falling?  No   Is the patient reluctant to leave their home because of a fear of falling?  No     Home Environment   Living Environment Private residence   Living Arrangements Spouse/significant other   Type of [place] Access Level entry   Home Layout One level   [place] - 2 wheels;Cane - single point;Shower seat;Hand held shower head;Grab bars - tub/shower;Toilet riser   Additional Comments  wife with disability and requires care from pt     Prior Function   Level of Independence Requires assistive device for independence   Vocation Unemployed   Vocation Requirements pt cares for his wife     Cognition   Overall Cognitive Status Within Functional Limits for tasks assessed     Observation/Other Assessments   Focus on Therapeutic Outcomes (FOTO)  76% limitation     Posture/Postural Control   Posture/Postural Control Postural limitations   Postural Limitations Rounded Shoulders;Decreased lumbar lordosis  obese   Posture Comments Pt is obese     ROM / Strength   AROM / PROM / Strength AROM;Strength     AROM   Overall AROM  Deficits   Overall AROM Comments Bil hip flexiblity limited by obesity.  Lt hip limited by pain.       Strength   Overall Strength Deficits   Overall Strength Comments Bil ankles 4+/5,  knees 4+/,5, hips 4/5     Transfers   Transfers Sit to Stand;Stand to Sit   Sit to Stand With upper extremity assist     Ambulation/Gait   Ambulation/Gait Yes   Ambulation/Gait Assistance 6: Modified independent (Device/Increase time)   Ambulation Distance (Feet) 100 Feet   Assistive device Straight cane   Gait Pattern Step-through pattern;Decreased step length - left;Decreased stance time - left;Antalgic   Ambulation Surface Level                           PT Education - 07/18/16 1129    Education provided Yes   Education Details hamstring stretch, knee to chest, seated LE strength   Person(s) Educated Patient   Methods Explanation;Demonstration;Handout   Comprehension Verbalized understanding;Returned demonstration          PT Short Term Goals - 07/18/16 1144      PT SHORT TERM GOAL #1   Title be independent in initial HEP   Time 4   Period Weeks   Status New     PT SHORT TERM GOAL #2   Title improve LE strength to perform sit to stand with moderate UE support   Time 4   Period Weeks   Status New     PT SHORT TERM GOAL #3   Title initiate cardio exercise in the pool with walking > or = to 2x/wk   Time 4   Period Weeks   Status New     PT SHORT TERM GOAL #4   Title report a 25% reduction in Lt hip pain with standing and walking   Time 4   Period Weeks   Status New           PT Long Term Goals - 07/18/16 1101      PT LONG TERM GOAL #1   Title be independent in advanced HEP   Time 8   Period Weeks   Status New     PT LONG TERM GOAL #2   Title reduce FOTO to < or = to 55% limitation   Time 8   Period Weeks   Status New     PT LONG TERM GOAL #3   Title report a 30% reduction in Lt hip pain with standing and walking   Time 8   Period Weeks   Status New     PT LONG TERM GOAL #4   Title improve LE strength to perform sit to stand with minimal to no UE support  Time 8   Period Weeks   Status New     PT LONG TERM GOAL #5    Title report compliance with regular strength and cardio exercise for weight loss   Time 8   Period Weeks   Status New               Plan - 07/18/16 1138    Clinical Impression Statement Pt presents to PT with Lt hip OA and significant pain that limits function.  Pt had x-ray 3 years ago that showed OA and MD recommended weight loss before surgery.  Pt with increased pain over the past 3 years and has not lost the weight needed for surgery.  Pt demonstrates antalgic gait with a cane, limited hip A/ROM and strength and tenderness to palpation in Lt gluteals and hamstrings.  Pt requires max UE support with sit to stand transition.  Pt is a moderate complexity evaluation due to evolving condition, 3 elements assessed and comorbidities including obesity and significant Lt hip OA that will impact care.  Pt will benefit from skilled PT for hip strength and endurance, flexiblity, gait and pain management to improve ability to exercise for weight loss.     Rehab Potential Good   PT Frequency 2x / week   PT Duration 8 weeks   PT Treatment/Interventions ADLs/Self Care Home Management;Cryotherapy;Electrical Stimulation;Iontophoresis 4mg /ml Dexamethasone;Functional mobility training;Stair training;Gait training;Ultrasound;Moist Heat;Therapeutic activities;Therapeutic exercise;Neuromuscular re-education;Patient/family education;Passive range of motion;Manual techniques;Dry needling;Taping   PT Next Visit Plan NuStep, hip and knee strength, flexibility Lt hip   Consulted and Agree with Plan of Care Patient      Patient will benefit from skilled therapeutic intervention in order to improve the following deficits and impairments:  Decreased strength, Abnormal gait, Pain, Impaired flexibility, Decreased activity tolerance, Decreased endurance, Difficulty walking, Decreased range of motion  Visit Diagnosis: Pain in left hip - Plan: PT plan of care cert/re-cert  Other abnormalities of gait and mobility  - Plan: PT plan of care cert/re-cert  Muscle weakness (generalized) - Plan: PT plan of care cert/re-cert  Stiffness of left hip, not elsewhere classified - Plan: PT plan of care cert/re-cert     Problem List Patient Active Problem List   Diagnosis Date Noted  . Severe obesity (BMI >= 40) ([place]) 01/15/2013  . OBESITY, MORBID 01/20/2007  . Essential hypertension 10/31/2006     Sigurd Sos, PT 07/18/16 11:50 AM  [facility] [address] [place], [place], [address] Phone: [phone]   Fax:  [phone]  Name: James [name] MRN: [id number] Date of Birth: 01-15-1956

## 2016-07-18 NOTE — Patient Instructions (Addendum)
HIP: Hamstrings - Short Sitting    Rest leg on raised surface. Keep knee straight. Lift chest. Hold _20__ seconds. _3__ reps per set, _3__ sets per day  Copyright  VHI. All rights reserved.  Knee to Chest (Flexion)    Pull knee toward chest. Feel stretch in lower back or buttock area. Breathing deeply, Hold __20__ seconds. Repeat with other knee. Repeat __3__ times. Do _3___ sessions per day.  http://gt2.exer.us/225   Copyright  VHI. All rights reserved.  KNEE: Extension, Long Arc Quad (Weight)  Place weight around leg. Raise leg until knee is straight. Hold _5__ seconds. Use ___ lb weight. _10__ reps per set (each leg), 4-5__ sets per day, __7_ days per week  Copyright  VHI. All rights reserved.     Knee Raise   Lift knee and then lower it. Repeat with other knee. Repeat _10__ times each leg. Do _4-5___ sessions per day.  http://gt2.exer.us/445   Copyright  VHI. All rights reserved.  Toe Up   Gently rise up on toes and back on heels. Repeat _20___ times. Do 4-5____ sessions per day.  [facility] [address], [place], [place] [address] Phone # [phone] Fax [phone]

## 2016-07-23 ENCOUNTER — Ambulatory Visit: Payer: BLUE CROSS/BLUE SHIELD | Admitting: Physical Therapy

## 2016-07-23 ENCOUNTER — Encounter: Payer: Self-pay | Admitting: Physical Therapy

## 2016-07-23 DIAGNOSIS — M6281 Muscle weakness (generalized): Secondary | ICD-10-CM

## 2016-07-23 DIAGNOSIS — M25552 Pain in left hip: Secondary | ICD-10-CM | POA: Diagnosis not present

## 2016-07-23 DIAGNOSIS — R2689 Other abnormalities of gait and mobility: Secondary | ICD-10-CM

## 2016-07-23 DIAGNOSIS — M25652 Stiffness of left hip, not elsewhere classified: Secondary | ICD-10-CM

## 2016-07-23 NOTE — Patient Instructions (Signed)

## 2016-07-23 NOTE — Therapy (Signed)
[facility] [address], [place], [place], [address] Phone: [phone]   Fax:  [phone]  Physical Therapy Treatment  Patient Details  Name: James [name] MRN: [id number] Date of Birth: 1955-05-09 Referring Provider: Dorothyann Peng, PA  Encounter Date: 07/23/2016      PT End of Session - 07/23/16 1028    Visit Number 2   Date for PT Re-Evaluation 09/12/16   PT Start Time 1008   PT Stop Time 1050   PT Time Calculation (min) 42 min   Activity Tolerance Patient limited by pain   Behavior During Therapy [facility] for tasks assessed/performed      Past Medical History:  Diagnosis Date  . Arthritis   . Chicken pox   . Hypertension   . Obesity     Past Surgical History:  Procedure Laterality Date  . cartilage removal    . KNEE SURGERY  2000    There were no vitals filed for this visit.      Subjective Assessment - 07/23/16 1012    Subjective Pain is really up there today, feels this is weather related.    Currently in Pain? Yes   Pain Score 9    Pain Location Hip   Pain Orientation Left;Posterior   Pain Descriptors / Indicators Sharp;Shooting   Aggravating Factors  weightbearing, storm possibly coming through   Pain Relieving Factors Meds   Multiple Pain Sites No                         [facility] Adult PT Treatment/Exercise - 07/23/16 0001      Knee/Hip Exercises: Stretches   Active Hamstring Stretch Left;3 reps;20 seconds     Knee/Hip Exercises: Aerobic   Nustep L1attempted but stopped due to pain.      Cryotherapy   Number Minutes Cryotherapy 20 Minutes   Cryotherapy Location --  Anterior hip/groin   Type of Cryotherapy Ice pack     Electrical Stimulation   Electrical Stimulation Location Posterior/lateral hip LT   Electrical Stimulation Action IFC   Electrical Stimulation Parameters 80-150HZ  hooklying   Electrical Stimulation Goals Pain     Iontophoresis   Type of  Iontophoresis Dexamethasone   Location --  Greatest area of pain just lateral to sacrum LT. Skin intact   Dose 1 ml   Time 6 hr wear                  PT Short Term Goals - 07/18/16 1144      PT SHORT TERM GOAL #1   Title be independent in initial HEP   Time 4   Period Weeks   Status New     PT SHORT TERM GOAL #2   Title improve LE strength to perform sit to stand with moderate UE support   Time 4   Period Weeks   Status New     PT SHORT TERM GOAL #3   Title initiate cardio exercise in the pool with walking > or = to 2x/wk   Time 4   Period Weeks   Status New     PT SHORT TERM GOAL #4   Title report a 25% reduction in Lt hip pain with standing and walking   Time 4   Period Weeks   Status New           PT Long Term Goals - 07/18/16 1101      PT LONG TERM  GOAL #1   Title be independent in advanced HEP   Time 8   Period Weeks   Status New     PT LONG TERM GOAL #2   Title reduce FOTO to < or = to 55% limitation   Time 8   Period Weeks   Status New     PT LONG TERM GOAL #3   Title report a 30% reduction in Lt hip pain with standing and walking   Time 8   Period Weeks   Status New     PT LONG TERM GOAL #4   Title improve LE strength to perform sit to stand with minimal to no UE support   Time 8   Period Weeks   Status New     PT LONG TERM GOAL #5   Title report compliance with regular strength and cardio exercise for weight loss   Time 8   Period Weeks   Status New               Plan - 07/23/16 1014    Clinical Impression Statement Pt in significant pain today: LT hip. Pt tried to do the Nustep but this was just too painful so we stopped.  He was able to return demonstrate the seated hamstring stretch.  His gait was antalgic and moved in general very cautiously.  Pt did report the Estim reduced his pain by 50%. Ionto was started.    Rehab Potential Good   PT Frequency 2x / week   PT Duration 8 weeks   PT Treatment/Interventions  ADLs/Self Care Home Management;Cryotherapy;Electrical Stimulation;Iontophoresis 4mg /ml Dexamethasone;Functional mobility training;Stair training;Gait training;Ultrasound;Moist Heat;Therapeutic activities;Therapeutic exercise;Neuromuscular re-education;Patient/family education;Passive range of motion;Manual techniques;Dry needling;Taping   PT Next Visit Plan Assess pain and see if Nustep could be tried again, assess ionto and continue with #2.   Consulted and Agree with Plan of Care Patient      Patient will benefit from skilled therapeutic intervention in order to improve the following deficits and impairments:  Decreased strength, Abnormal gait, Pain, Impaired flexibility, Decreased activity tolerance, Decreased endurance, Difficulty walking, Decreased range of motion  Visit Diagnosis: Pain in left hip  Other abnormalities of gait and mobility  Stiffness of left hip, not elsewhere classified  Muscle weakness (generalized)     Problem List Patient Active Problem List   Diagnosis Date Noted  . Severe obesity (BMI >= 40) ([place]) 01/15/2013  . OBESITY, MORBID 01/20/2007  . Essential hypertension 10/31/2006    COCHRAN,JENNIFER, PTA 07/23/2016, 10:59 AM  [facility] [address], [place], [place], [address] Phone: [phone]   Fax:  [phone]  Name: James [name] MRN: [id number] Date of Birth: 01-Dec-1955

## 2016-07-26 ENCOUNTER — Encounter: Payer: Self-pay | Admitting: Physical Therapy

## 2016-07-26 ENCOUNTER — Ambulatory Visit: Payer: BLUE CROSS/BLUE SHIELD | Admitting: Physical Therapy

## 2016-07-26 ENCOUNTER — Encounter (INDEPENDENT_AMBULATORY_CARE_PROVIDER_SITE_OTHER): Payer: Self-pay | Admitting: Family Medicine

## 2016-07-26 DIAGNOSIS — R2689 Other abnormalities of gait and mobility: Secondary | ICD-10-CM

## 2016-07-26 DIAGNOSIS — M6281 Muscle weakness (generalized): Secondary | ICD-10-CM

## 2016-07-26 DIAGNOSIS — M25552 Pain in left hip: Secondary | ICD-10-CM

## 2016-07-26 DIAGNOSIS — M25652 Stiffness of left hip, not elsewhere classified: Secondary | ICD-10-CM

## 2016-07-26 NOTE — Therapy (Signed)
[facility] [address], [place], [place], [address] Phone: [phone]   Fax:  [phone]  Physical Therapy Treatment  Patient Details  Name: James [name] MRN: [id number] Date of Birth: [birth year] Referring Provider: Dorothyann Peng, PA  Encounter Date: 07/26/2016      PT End of Session - 07/26/16 1127    Visit Number 3   Date for PT Re-Evaluation 09/12/16   PT Start Time 1102   PT Stop Time 1142   PT Time Calculation (min) 40 min   Activity Tolerance Patient limited by pain   Behavior During Therapy [facility] for tasks assessed/performed      Past Medical History:  Diagnosis Date  . Arthritis   . Chicken pox   . Hypertension   . Obesity     Past Surgical History:  Procedure Laterality Date  . cartilage removal    . KNEE SURGERY  2000    There were no vitals filed for this visit.      Subjective Assessment - 07/26/16 1107    Subjective Up until last night, hip has been really sore.  He has had pain from low back down to knee.  Feels better today.   Pertinent History Pt has wife with disability and he cares for her, Lt hip OA-significant   Limitations Standing;Walking   Currently in Pain? Yes   Pain Score 4    Pain Location Hip   Pain Orientation Left   Pain Descriptors / Indicators Aching;Sore   Pain Type Chronic pain   Pain Onset More than a month ago   Multiple Pain Sites No                         [facility] Adult PT Treatment/Exercise - 07/26/16 0001      Knee/Hip Exercises: Supine   Short Arc Quad Sets Strengthening;20 reps  4#   Heel Slides Strengthening;2 sets;10 reps   Hip Adduction Isometric Strengthening;10 reps  5 sec hold ball squeeze   Other Supine Knee/Hip Exercises isometric hip extension 10x 5 sec hold     Knee/Hip Exercises: Sidelying   Clams 20x     Iontophoresis   Type of Iontophoresis Dexamethasone   Location area of greatest pain between sacrum and  greater trochanter   Dose 1 ml   Time 6 hr wear     Manual Therapy   Manual Therapy Soft tissue mobilization;Joint mobilization   Manual therapy comments right side lying   Joint Mobilization long axis distraction hip joint   Soft tissue mobilization left glutes                  PT Short Term Goals - 07/26/16 1128      PT SHORT TERM GOAL #1   Title be independent in initial HEP   Time 4   Period Weeks   Status On-going     PT SHORT TERM GOAL #2   Title improve LE strength to perform sit to stand with moderate UE support   Time 4   Period Weeks   Status On-going     PT SHORT TERM GOAL #3   Title initiate cardio exercise in the pool with walking > or = to 2x/wk   Time 4   Period Weeks   Status On-going     PT SHORT TERM GOAL #4   Title report a 25% reduction in Lt hip pain with standing and walking  Time 4   Period Weeks   Status On-going           PT Long Term Goals - 07/18/16 1101      PT LONG TERM GOAL #1   Title be independent in advanced HEP   Time 8   Period Weeks   Status New     PT LONG TERM GOAL #2   Title reduce FOTO to < or = to 55% limitation   Time 8   Period Weeks   Status New     PT LONG TERM GOAL #3   Title report a 30% reduction in Lt hip pain with standing and walking   Time 8   Period Weeks   Status New     PT LONG TERM GOAL #4   Title improve LE strength to perform sit to stand with minimal to no UE support   Time 8   Period Weeks   Status New     PT LONG TERM GOAL #5   Title report compliance with regular strength and cardio exercise for weight loss   Time 8   Period Weeks   Status New               Plan - 07/26/16 1130    Clinical Impression Statement Pt feeling better, able to do exercises today.  Did not attempt nustep due to availability but will try next session . Pt demonstrated lack of endurnace and became noticeably fatigued with exercises today.  Gait is antalgic with reverse Trendelenburg to  the left ambulating with cane.   Rehab Potential Good   PT Treatment/Interventions ADLs/Self Care Home Management;Cryotherapy;Electrical Stimulation;Iontophoresis 4mg /ml Dexamethasone;Functional mobility training;Stair training;Gait training;Ultrasound;Moist Heat;Therapeutic activities;Therapeutic exercise;Neuromuscular re-education;Patient/family education;Passive range of motion;Manual techniques;Dry needling;Taping   PT Next Visit Plan f/u on going to pool (may do this weekend), ionto #3, nustep, progress LE strengthening program      Patient will benefit from skilled therapeutic intervention in order to improve the following deficits and impairments:  Decreased strength, Abnormal gait, Pain, Impaired flexibility, Decreased activity tolerance, Decreased endurance, Difficulty walking, Decreased range of motion  Visit Diagnosis: Pain in left hip  Other abnormalities of gait and mobility  Stiffness of left hip, not elsewhere classified  Muscle weakness (generalized)     Problem List Patient Active Problem List   Diagnosis Date Noted  . Severe obesity (BMI >= 40) ([place]) 01/15/2013  . OBESITY, MORBID 01/20/2007  . Essential hypertension 10/31/2006    Zannie Cove, PT 07/26/2016, 5:22 PM  [facility] [address], [place], [place], [address] Phone: [phone]   Fax:  [phone]  Name: YIANNIS TULLOCH MRN: [id number] Date of Birth: 24-Jan-1956

## 2016-07-31 ENCOUNTER — Ambulatory Visit: Payer: BLUE CROSS/BLUE SHIELD

## 2016-07-31 DIAGNOSIS — M6281 Muscle weakness (generalized): Secondary | ICD-10-CM

## 2016-07-31 DIAGNOSIS — M25552 Pain in left hip: Secondary | ICD-10-CM | POA: Diagnosis not present

## 2016-07-31 DIAGNOSIS — R2689 Other abnormalities of gait and mobility: Secondary | ICD-10-CM

## 2016-07-31 DIAGNOSIS — M25652 Stiffness of left hip, not elsewhere classified: Secondary | ICD-10-CM

## 2016-07-31 NOTE — Therapy (Signed)
[facility] [address], [place], [place], [address] Phone: [phone]   Fax:  [phone]  Physical Therapy Treatment  Patient Details  Name: James [name] MRN: [id number] Date of Birth: 02/22/1956 Referring Provider: Dorothyann Peng, PA  Encounter Date: 07/31/2016      PT End of Session - 07/31/16 1137    Visit Number 4   Date for PT Re-Evaluation 09/12/16   PT Start Time 1101   PT Stop Time 1139   PT Time Calculation (min) 38 min   Activity Tolerance Patient tolerated treatment well   Behavior During Therapy [facility] for tasks assessed/performed      Past Medical History:  Diagnosis Date  . Arthritis   . Chicken pox   . Hypertension   . Obesity     Past Surgical History:  Procedure Laterality Date  . cartilage removal    . KNEE SURGERY  2000    There were no vitals filed for this visit.      Subjective Assessment - 07/31/16 1105    Subjective I feel much better this week.     Patient Stated Goals reduce hip pain, improve LE strength, lose weight   Currently in Pain? Yes   Pain Score 2    Pain Location Hip   Pain Orientation Left   Pain Descriptors / Indicators Aching;Sore   Pain Type Chronic pain   Pain Onset More than a month ago   Pain Frequency Constant   Aggravating Factors  weightbearing   Pain Relieving Factors medication, rest                         [facility] Adult PT Treatment/Exercise - 07/31/16 0001      Knee/Hip Exercises: Stretches   Active Hamstring Stretch Left;3 reps;20 seconds     Knee/Hip Exercises: Aerobic   Nustep Level 1 x 7 minutes-slow mobility  seat 9     Knee/Hip Exercises: Supine   Short Arc Quad Sets Strengthening;20 reps  4#   Hip Adduction Isometric Strengthening;20 reps  5 sec hold ball squeeze   Other Supine Knee/Hip Exercises isometric hip extension 10x 5 sec hold     Knee/Hip Exercises: Sidelying   Clams 20x                   PT Short Term Goals - 07/31/16 1115      PT SHORT TERM GOAL #1   Title be independent in initial HEP   Status Achieved     PT SHORT TERM GOAL #2   Title improve LE strength to perform sit to stand with moderate UE support   Time 4   Period Weeks   Status On-going     PT SHORT TERM GOAL #3   Title initiate cardio exercise in the pool with walking > or = to 2x/wk   Time 4   Period Weeks   Status On-going     PT SHORT TERM GOAL #4   Title report a 25% reduction in Lt hip pain with standing and walking   Time 4   Period Weeks   Status On-going           PT Long Term Goals - 07/18/16 1101      PT LONG TERM GOAL #1   Title be independent in advanced HEP   Time 8   Period Weeks   Status New     PT LONG TERM GOAL #  2   Title reduce FOTO to < or = to 55% limitation   Time 8   Period Weeks   Status New     PT LONG TERM GOAL #3   Title report a 30% reduction in Lt hip pain with standing and walking   Time 8   Period Weeks   Status New     PT LONG TERM GOAL #4   Title improve LE strength to perform sit to stand with minimal to no UE support   Time 8   Period Weeks   Status New     PT LONG TERM GOAL #5   Title report compliance with regular strength and cardio exercise for weight loss   Time 8   Period Weeks   Status New               Plan - 07/31/16 1120    Clinical Impression Statement Pt had a flare-up of pain last week and is feeling better now.  Pt has not been going to the pool for walking.  Pt with limited mobility and lack of endurance due to deconditioning.  Pt tolerated low level exercise today.  Pt with antalgic gait and relies heavily on his cane.  Pt will continue to benefit from skilled PT for Lt hip strength, endurance and gait.     Rehab Potential Good   PT Frequency 2x / week   PT Duration 8 weeks   PT Treatment/Interventions ADLs/Self Care Home Management;Cryotherapy;Electrical Stimulation;Iontophoresis 4mg /ml  Dexamethasone;Functional mobility training;Stair training;Gait training;Ultrasound;Moist Heat;Therapeutic activities;Therapeutic exercise;Neuromuscular re-education;Patient/family education;Passive range of motion;Manual techniques;Dry needling;Taping   PT Next Visit Plan Gentle LE strength as tolerated.        Patient will benefit from skilled therapeutic intervention in order to improve the following deficits and impairments:  Decreased strength, Abnormal gait, Pain, Impaired flexibility, Decreased activity tolerance, Decreased endurance, Difficulty walking, Decreased range of motion  Visit Diagnosis: Pain in left hip  Other abnormalities of gait and mobility  Stiffness of left hip, not elsewhere classified  Muscle weakness (generalized)     Problem List Patient Active Problem List   Diagnosis Date Noted  . Severe obesity (BMI >= 40) ([place]) 01/15/2013  . OBESITY, MORBID 01/20/2007  . Essential hypertension 10/31/2006     Sigurd Sos, PT 07/31/16 11:47 AM  [facility] [address] [place], [place], [address] Phone: [phone]   Fax:  [phone]  Name: TORON BOWRING MRN: [id number] Date of Birth: 09-06-1955

## 2016-08-02 ENCOUNTER — Encounter: Payer: Self-pay | Admitting: Physical Therapy

## 2016-08-02 ENCOUNTER — Ambulatory Visit: Payer: BLUE CROSS/BLUE SHIELD | Admitting: Physical Therapy

## 2016-08-02 DIAGNOSIS — M25552 Pain in left hip: Secondary | ICD-10-CM | POA: Diagnosis not present

## 2016-08-02 DIAGNOSIS — M25652 Stiffness of left hip, not elsewhere classified: Secondary | ICD-10-CM

## 2016-08-02 DIAGNOSIS — M6281 Muscle weakness (generalized): Secondary | ICD-10-CM

## 2016-08-02 DIAGNOSIS — R2689 Other abnormalities of gait and mobility: Secondary | ICD-10-CM

## 2016-08-02 NOTE — Therapy (Signed)
[facility] [address] [place], [place], [address] Phone: [phone]   Fax:  [phone]  Physical Therapy Treatment  Patient Details  Name: James [name] MRN: [id number] Date of Birth: [birth year] Referring Provider: Dorothyann Peng, PA  Encounter Date: 08/02/2016      PT End of Session - 08/02/16 1403    Visit Number 5   Date for PT Re-Evaluation 09/12/16   PT Start Time 1400   PT Stop Time 1442   PT Time Calculation (min) 42 min   Activity Tolerance Patient tolerated treatment well   Behavior During Therapy [facility] for tasks assessed/performed      Past Medical History:  Diagnosis Date  . Arthritis   . Chicken pox   . Hypertension   . Obesity     Past Surgical History:  Procedure Laterality Date  . cartilage removal    . KNEE SURGERY  2000    There were no vitals filed for this visit.      Subjective Assessment - 08/02/16 1401    Subjective Feeling about the same today, but still better than Tuesday.   Pertinent History Pt has wife with disability and he cares for her, Lt hip OA-significant   Limitations Standing;Walking   How long can you stand comfortably? Rt>Lt weightbearing 25 minutes   How long can you walk comfortably? using cane or walker, antalgic: limited to <5 minutes   Diagnostic tests Lt hip x-ray: significant OA- 3 years ago   Patient Stated Goals reduce hip pain, improve LE strength, lose weight   Currently in Pain? Yes   Pain Score 2    Pain Location Hip   Pain Orientation Left   Pain Descriptors / Indicators Aching;Sore   Pain Type Chronic pain   Pain Radiating Towards Rt calf   Pain Onset More than a month ago   Pain Frequency Constant                         [facility] Adult PT Treatment/Exercise - 08/02/16 0001      Knee/Hip Exercises: Stretches   Active Hamstring Stretch Left;3 reps;20 seconds   Quad Stretch Both;2 reps;10 seconds   Knee: Self-Stretch to  increase Flexion Both;2 reps;10 seconds   Gastroc Stretch --   Other Knee/Hip Stretches ITB stretch 1x 10 seconds     Knee/Hip Exercises: Aerobic   Nustep Level 1 x 7 minutes-slow mobility  seat 9     Knee/Hip Exercises: Seated   Other Seated Knee/Hip Exercises Heel presses into floor  x10 5 second holds     Knee/Hip Exercises: Supine   Short Arc Quad Sets Strengthening;20 reps  4#   Heel Slides Strengthening;10 reps;1 set   Hip Adduction Isometric Strengthening;20 reps  5 sec hold ball squeeze   Other Supine Knee/Hip Exercises isometric hip extension 10x 5 sec hold     Knee/Hip Exercises: Sidelying   Clams 20x     Manual Therapy   Manual Therapy Passive ROM   Passive ROM Manual stretching to hip/ knee   knee to chest, ITB, quad stretch                  PT Short Term Goals - 07/31/16 1115      PT SHORT TERM GOAL #1   Title be independent in initial HEP   Status Achieved     PT SHORT TERM GOAL #2   Title improve LE strength to  perform sit to stand with moderate UE support   Time 4   Period Weeks   Status On-going     PT SHORT TERM GOAL #3   Title initiate cardio exercise in the pool with walking > or = to 2x/wk   Time 4   Period Weeks   Status On-going     PT SHORT TERM GOAL #4   Title report a 25% reduction in Lt hip pain with standing and walking   Time 4   Period Weeks   Status On-going           PT Long Term Goals - 07/18/16 1101      PT LONG TERM GOAL #1   Title be independent in advanced HEP   Time 8   Period Weeks   Status New     PT LONG TERM GOAL #2   Title reduce FOTO to < or = to 55% limitation   Time 8   Period Weeks   Status New     PT LONG TERM GOAL #3   Title report a 30% reduction in Lt hip pain with standing and walking   Time 8   Period Weeks   Status New     PT LONG TERM GOAL #4   Title improve LE strength to perform sit to stand with minimal to no UE support   Time 8   Period Weeks   Status New     PT  LONG TERM GOAL #5   Title report compliance with regular strength and cardio exercise for weight loss   Time 8   Period Weeks   Status New               Plan - 08/02/16 1442    Clinical Impression Statement Pt reports pain is same as ast visit. Plannin to go to pool tomorrow to exercise. Pt able to tolerate all exercises well. Reports having good success with stretches last time he has therapy. Responded well to manual stretches to hip and knee. Pt will continue to benefit from skilled therapy for core and hip stablity and flexibility.    Rehab Potential Good   PT Frequency 2x / week   PT Duration 8 weeks   PT Treatment/Interventions ADLs/Self Care Home Management;Cryotherapy;Electrical Stimulation;Iontophoresis 4mg /ml Dexamethasone;Functional mobility training;Stair training;Gait training;Ultrasound;Moist Heat;Therapeutic activities;Therapeutic exercise;Neuromuscular re-education;Patient/family education;Passive range of motion;Manual techniques;Dry needling;Taping   PT Next Visit Plan Gentle LE strength as tolerated.     Consulted and Agree with Plan of Care Patient      Patient will benefit from skilled therapeutic intervention in order to improve the following deficits and impairments:  Decreased strength, Abnormal gait, Pain, Impaired flexibility, Decreased activity tolerance, Decreased endurance, Difficulty walking, Decreased range of motion  Visit Diagnosis: Pain in left hip  Other abnormalities of gait and mobility  Stiffness of left hip, not elsewhere classified  Muscle weakness (generalized)     Problem List Patient Active Problem List   Diagnosis Date Noted  . Severe obesity (BMI >= 40) ([place]) 01/15/2013  . OBESITY, MORBID 01/20/2007  . Essential hypertension 10/31/2006    Mikle Bosworth PTA 08/02/2016, 2:48 PM  [facility] [address], [place], [place], [address] Phone: [phone]    Fax:  [phone]  Name: James [name] MRN: [id number] Date of Birth: 1955/09/20

## 2016-08-07 ENCOUNTER — Ambulatory Visit: Payer: BLUE CROSS/BLUE SHIELD | Attending: Adult Health | Admitting: Physical Therapy

## 2016-08-07 ENCOUNTER — Encounter: Payer: Self-pay | Admitting: Physical Therapy

## 2016-08-07 DIAGNOSIS — M25652 Stiffness of left hip, not elsewhere classified: Secondary | ICD-10-CM | POA: Diagnosis present

## 2016-08-07 DIAGNOSIS — M25552 Pain in left hip: Secondary | ICD-10-CM

## 2016-08-07 DIAGNOSIS — R2689 Other abnormalities of gait and mobility: Secondary | ICD-10-CM | POA: Insufficient documentation

## 2016-08-07 DIAGNOSIS — M6281 Muscle weakness (generalized): Secondary | ICD-10-CM

## 2016-08-07 NOTE — Therapy (Signed)
[facility] [address], [place], [place], [address] Phone: [phone]   Fax:  [phone]  Physical Therapy Treatment  Patient Details  Name: James [name] MRN: [id number] Date of Birth: 03-Sep-1955 Referring Provider: Dorothyann Peng, PA  Encounter Date: 08/07/2016      PT End of Session - 08/07/16 1103    Visit Number 6   Date for PT Re-Evaluation 09/12/16   PT Start Time 1058   PT Stop Time 1142   PT Time Calculation (min) 44 min   Activity Tolerance Patient tolerated treatment well   Behavior During Therapy [facility] for tasks assessed/performed      Past Medical History:  Diagnosis Date  . Arthritis   . Chicken pox   . Hypertension   . Obesity     Past Surgical History:  Procedure Laterality Date  . cartilage removal    . KNEE SURGERY  2000    There were no vitals filed for this visit.      Subjective Assessment - 08/07/16 1101    Subjective Hip feeling tight today.    Pertinent History Pt has wife with disability and he cares for her, Lt hip OA-significant   Limitations Standing;Walking   How long can you stand comfortably? Rt>Lt weightbearing 25 minutes   How long can you walk comfortably? using cane or walker, antalgic: limited to <5 minutes   Diagnostic tests Lt hip x-ray: significant OA- 3 years ago   Patient Stated Goals reduce hip pain, improve LE strength, lose weight   Currently in Pain? Yes   Pain Score 4    Pain Location Hip   Pain Orientation Left   Pain Descriptors / Indicators Aching;Sore   Pain Type Chronic pain   Pain Radiating Towards Rt calf   Pain Onset More than a month ago   Pain Frequency Constant   Aggravating Factors  weight beraing   Pain Relieving Factors medication, rest   Effect of Pain on Daily Activities walking limited to <5 minutes, significant Lt hip pain that limits weightbearing function   Multiple Pain Sites No                          [facility] Adult PT Treatment/Exercise - 08/07/16 0001      Knee/Hip Exercises: Stretches   Active Hamstring Stretch Left;3 reps;20 seconds   Quad Stretch Both;2 reps;10 seconds   Knee: Self-Stretch to increase Flexion Both;2 reps;10 seconds   Other Knee/Hip Stretches ITB stretch 1x 10 seconds     Knee/Hip Exercises: Aerobic   Nustep Level 1 x 7 minutes-slow mobility  therapist present to discuss treatment     Knee/Hip Exercises: Seated   Other Seated Knee/Hip Exercises Heel presses into floor  x10 5 second holds     Knee/Hip Exercises: Supine   Short Arc Quad Sets Strengthening;20 reps  4#   Heel Slides Strengthening;10 reps;1 set   Hip Adduction Isometric Strengthening;20 reps  5 sec hold ball squeeze   Other Supine Knee/Hip Exercises isometric hip extension 10x 5 sec hold     Manual Therapy   Manual Therapy Passive ROM   Passive ROM Manual stretching to hip/ knee   knee to chest, ITB, quad stretch                  PT Short Term Goals - 08/07/16 1105      PT SHORT TERM GOAL #2   Title improve LE  strength to perform sit to stand with moderate UE support   Time 4   Period Weeks   Status On-going     PT SHORT TERM GOAL #3   Title initiate cardio exercise in the pool with walking > or = to 2x/wk   Time 4   Period Weeks   Status On-going     PT SHORT TERM GOAL #4   Title report a 25% reduction in Lt hip pain with standing and walking   Time 4   Period Weeks   Status On-going           PT Long Term Goals - 08/07/16 1105      PT LONG TERM GOAL #1   Title be independent in advanced HEP   Time 8   Period Weeks   Status On-going     PT LONG TERM GOAL #2   Title reduce FOTO to < or = to 55% limitation   Time 8   Period Weeks   Status On-going     PT LONG TERM GOAL #3   Status On-going     PT LONG TERM GOAL #4   Title improve LE strength to perform sit to stand with minimal to no UE support   Time 8   Period Weeks   Status On-going     PT  LONG TERM GOAL #5   Title report compliance with regular strength and cardio exercise for weight loss   Time 8   Period Weeks   Status On-going               Plan - 08/07/16 1148    Clinical Impression Statement Pt having tightness in anterior hip and cramping pains in posteriror hip that sometimes radiate down leg. Pt feels much looser after manual PROM stretching to hip. Pt able to tolerate all exercises well. States he feels better since coming to therapy and moving more. Pt will continue to benefit from skilled therapy for hip strenght and stability.    Rehab Potential Good   PT Frequency 2x / week   PT Duration 8 weeks   PT Treatment/Interventions ADLs/Self Care Home Management;Cryotherapy;Electrical Stimulation;Iontophoresis 4mg /ml Dexamethasone;Functional mobility training;Stair training;Gait training;Ultrasound;Moist Heat;Therapeutic activities;Therapeutic exercise;Neuromuscular re-education;Patient/family education;Passive range of motion;Manual techniques;Dry needling;Taping   PT Next Visit Plan Gentle LE strength as tolerated, manual stretching    Consulted and Agree with Plan of Care Patient      Patient will benefit from skilled therapeutic intervention in order to improve the following deficits and impairments:  Decreased strength, Abnormal gait, Pain, Impaired flexibility, Decreased activity tolerance, Decreased endurance, Difficulty walking, Decreased range of motion  Visit Diagnosis: Pain in left hip  Other abnormalities of gait and mobility  Stiffness of left hip, not elsewhere classified  Muscle weakness (generalized)     Problem List Patient Active Problem List   Diagnosis Date Noted  . Severe obesity (BMI >= 40) ([place]) 01/15/2013  . OBESITY, MORBID 01/20/2007  . Essential hypertension 10/31/2006    Mikle Bosworth PTA 08/07/2016, 11:51 AM  [facility] [address], [place],  [place], [address] Phone: [phone]   Fax:  [phone]  Name: James [name] MRN: [id number] Date of Birth: March 16, 1956

## 2016-08-09 ENCOUNTER — Encounter: Payer: Self-pay | Admitting: Physical Therapy

## 2016-08-09 ENCOUNTER — Ambulatory Visit: Payer: BLUE CROSS/BLUE SHIELD | Admitting: Physical Therapy

## 2016-08-09 DIAGNOSIS — M6281 Muscle weakness (generalized): Secondary | ICD-10-CM

## 2016-08-09 DIAGNOSIS — M25552 Pain in left hip: Secondary | ICD-10-CM | POA: Diagnosis not present

## 2016-08-09 DIAGNOSIS — R2689 Other abnormalities of gait and mobility: Secondary | ICD-10-CM

## 2016-08-09 DIAGNOSIS — M25652 Stiffness of left hip, not elsewhere classified: Secondary | ICD-10-CM

## 2016-08-09 NOTE — Therapy (Signed)
[facility] [address] [place], [place], [address] Phone: [phone]   Fax:  [phone]  Physical Therapy Treatment  Patient Details  Name: James [name] MRN: [id number] Date of Birth: 20-Sep-1955 Referring Provider: Dorothyann Peng, PA  Encounter Date: 08/09/2016      PT End of Session - 08/09/16 1112    Visit Number 7   Date for PT Re-Evaluation 09/12/16   PT Start Time 1101   PT Stop Time 1141   PT Time Calculation (min) 40 min   Activity Tolerance Patient tolerated treatment well   Behavior During Therapy [facility] for tasks assessed/performed      Past Medical History:  Diagnosis Date  . Arthritis   . Chicken pox   . Hypertension   . Obesity     Past Surgical History:  Procedure Laterality Date  . cartilage removal    . KNEE SURGERY  2000    There were no vitals filed for this visit.      Subjective Assessment - 08/09/16 1103    Subjective Hip doing ok today. Tuesday night after therapy and taking my wife out I was hurting. I had to take a muscle relaxer.    Pertinent History Pt has wife with disability and he cares for her, Lt hip OA-significant   Limitations Standing;Walking   How long can you stand comfortably? Rt>Lt weightbearing 25 minutes   How long can you walk comfortably? using cane or walker, antalgic: limited to <5 minutes   Diagnostic tests Lt hip x-ray: significant OA- 3 years ago   Patient Stated Goals reduce hip pain, improve LE strength, lose weight   Currently in Pain? Yes   Pain Score 4    Pain Location Hip   Pain Orientation Left   Pain Descriptors / Indicators Aching;Sore   Pain Type Chronic pain   Pain Radiating Towards Rt calf                         [facility] Adult PT Treatment/Exercise - 08/09/16 0001      Therapeutic Activites    Therapeutic Activities ADL's   ADL's Standing, sit to stand, walking     Knee/Hip Exercises: Stretches   Active  Hamstring Stretch Left;3 reps;20 seconds  Leg lengthening   Quad Stretch Both;2 reps;10 seconds   Knee: Self-Stretch to increase Flexion Both;2 reps;10 seconds   Gastroc Stretch Both;2 reps;10 seconds  Slant board   Other Knee/Hip Stretches ITB stretch 1x 10 seconds     Knee/Hip Exercises: Aerobic   Nustep Level 1 x 7 minutes-slow mobility  therapist present to discuss treatment     Knee/Hip Exercises: Standing   Knee Flexion Strengthening;Both;2 sets;5 reps  Hamstring curls   Hip Abduction AROM;Both;2 sets;5 reps   Hip Extension AROM;Both;2 sets;5 sets     Knee/Hip Exercises: Seated   Sit to Sand 1 set;5 reps  Sitting on Blue mat     Knee/Hip Exercises: Supine   Short Arc Quad Sets Strengthening;20 reps  4#   Heel Slides Strengthening;10 reps;1 set   Hip Adduction Isometric Strengthening;20 reps  5 sec hold ball squeeze   Other Supine Knee/Hip Exercises isometric hip extension 10x 5 sec hold     Manual Therapy   Manual Therapy Passive ROM   Passive ROM Manual stretching to hip/ knee   knee to chest, ITB, quad stretch  PT Short Term Goals - 08/07/16 1105      PT SHORT TERM GOAL #2   Title improve LE strength to perform sit to stand with moderate UE support   Time 4   Period Weeks   Status On-going     PT SHORT TERM GOAL #3   Title initiate cardio exercise in the pool with walking > or = to 2x/wk   Time 4   Period Weeks   Status On-going     PT SHORT TERM GOAL #4   Title report a 25% reduction in Lt hip pain with standing and walking   Time 4   Period Weeks   Status On-going           PT Long Term Goals - 08/07/16 1105      PT LONG TERM GOAL #1   Title be independent in advanced HEP   Time 8   Period Weeks   Status On-going     PT LONG TERM GOAL #2   Title reduce FOTO to < or = to 55% limitation   Time 8   Period Weeks   Status On-going     PT LONG TERM GOAL #3   Status On-going     PT LONG TERM GOAL #4   Title  improve LE strength to perform sit to stand with minimal to no UE support   Time 8   Period Weeks   Status On-going     PT LONG TERM GOAL #5   Title report compliance with regular strength and cardio exercise for weight loss   Time 8   Period Weeks   Status On-going               Plan - 08/09/16 1139    Clinical Impression Statement Pt with continued hip tightness. Manual PROP stretching helps to loosen it up. Pt able to tolerate standing exercises today with minimal pain. Pt doing better with supine exercises as well. Pt will continue to benefit from skilled therapy for strength and stability.    Rehab Potential Good   PT Frequency 2x / week   PT Duration 8 weeks   PT Treatment/Interventions ADLs/Self Care Home Management;Cryotherapy;Electrical Stimulation;Iontophoresis 4mg /ml Dexamethasone;Functional mobility training;Stair training;Gait training;Ultrasound;Moist Heat;Therapeutic activities;Therapeutic exercise;Neuromuscular re-education;Patient/family education;Passive range of motion;Manual techniques;Dry needling;Taping   PT Next Visit Plan Gentle LE strength as tolerated, manual stretching    Consulted and Agree with Plan of Care Patient      Patient will benefit from skilled therapeutic intervention in order to improve the following deficits and impairments:  Decreased strength, Abnormal gait, Pain, Impaired flexibility, Decreased activity tolerance, Decreased endurance, Difficulty walking, Decreased range of motion  Visit Diagnosis: Pain in left hip  Other abnormalities of gait and mobility  Stiffness of left hip, not elsewhere classified  Muscle weakness (generalized)     Problem List Patient Active Problem List   Diagnosis Date Noted  . Severe obesity (BMI >= 40) ([place]) 01/15/2013  . OBESITY, MORBID 01/20/2007  . Essential hypertension 10/31/2006    Mikle Bosworth PTA 08/09/2016, 11:47 AM  [facility] [address], [place], [place], [address] Phone: [phone]   Fax:  [phone]  Name: James [name] MRN: [id number] Date of Birth: 05-16-1955

## 2016-08-10 ENCOUNTER — Other Ambulatory Visit: Payer: Self-pay | Admitting: Adult Health

## 2016-08-10 ENCOUNTER — Encounter: Payer: Self-pay | Admitting: Adult Health

## 2016-08-10 MED ORDER — CYCLOBENZAPRINE HCL 10 MG PO TABS: 10.0000 mg | ORAL_TABLET | Freq: Three times a day (TID) | ORAL | 0 refills | Status: DC | PRN

## 2016-08-10 MED ORDER — LISINOPRIL 5 MG PO TABS: ORAL_TABLET | ORAL | 1 refills | Status: DC

## 2016-08-12 ENCOUNTER — Encounter (INDEPENDENT_AMBULATORY_CARE_PROVIDER_SITE_OTHER): Payer: Self-pay | Admitting: Family Medicine

## 2016-08-13 NOTE — Telephone Encounter (Signed)
Please cancel

## 2016-08-14 ENCOUNTER — Ambulatory Visit: Payer: BLUE CROSS/BLUE SHIELD | Admitting: Physical Therapy

## 2016-08-14 ENCOUNTER — Encounter: Payer: Self-pay | Admitting: Physical Therapy

## 2016-08-14 DIAGNOSIS — M25552 Pain in left hip: Secondary | ICD-10-CM | POA: Diagnosis not present

## 2016-08-14 DIAGNOSIS — M6281 Muscle weakness (generalized): Secondary | ICD-10-CM

## 2016-08-14 DIAGNOSIS — R2689 Other abnormalities of gait and mobility: Secondary | ICD-10-CM

## 2016-08-14 DIAGNOSIS — M25652 Stiffness of left hip, not elsewhere classified: Secondary | ICD-10-CM

## 2016-08-14 NOTE — Therapy (Signed)
[facility] [address] [place], [place], [address] Phone: [phone]   Fax:  [phone]  Physical Therapy Treatment  Patient Details  Name: James [name] MRN: [id number] Date of Birth: March 06, 1956 Referring Provider: Dorothyann Peng, PA  Encounter Date: 08/14/2016      PT End of Session - 08/14/16 1103    Visit Number 8   Date for PT Re-Evaluation 09/12/16   PT Start Time 1100   PT Stop Time 1148   PT Time Calculation (min) 48 min   Activity Tolerance Patient tolerated treatment well   Behavior During Therapy [facility] for tasks assessed/performed      Past Medical History:  Diagnosis Date  . Arthritis   . Chicken pox   . Hypertension   . Obesity     Past Surgical History:  Procedure Laterality Date  . cartilage removal    . KNEE SURGERY  2000    There were no vitals filed for this visit.      Subjective Assessment - 08/14/16 1100    Subjective Hip feels like it's doing better. Feels that he is putting less weight through cane and right hip. Feels that he is able to get around better.    Pertinent History Pt has wife with disability and he cares for her, Lt hip OA-significant   Limitations Standing;Walking   How long can you stand comfortably? Rt>Lt weightbearing 25 minutes   How long can you walk comfortably? using cane or walker, antalgic: limited to <5 minutes   Diagnostic tests Lt hip x-ray: significant OA- 3 years ago   Patient Stated Goals reduce hip pain, improve LE strength, lose weight   Currently in Pain? Yes   Pain Score 4    Pain Location Hip   Pain Orientation Left   Pain Descriptors / Indicators Aching;Sore   Pain Type Chronic pain   Pain Radiating Towards Rt calf   Pain Onset More than a month ago   Pain Frequency Constant   Aggravating Factors  weight bearing   Pain Relieving Factors medication, rest   Effect of Pain on Daily Activities walking limited to <5 minutes significant  Lt hip pain that lmits weightbearing function                         [facility] Adult PT Treatment/Exercise - 08/14/16 0001      Therapeutic Activites    Therapeutic Activities ADL's   ADL's Standing, sit to stand, walking     Knee/Hip Exercises: Stretches   Active Hamstring Stretch Left;3 reps;20 seconds  Leg lengthening   Quad Stretch Both;2 reps;10 seconds   Knee: Self-Stretch to increase Flexion Both;2 reps;10 seconds   Gastroc Stretch Both;2 reps;10 seconds  Slant board   Other Knee/Hip Stretches ITB stretch 1x 10 seconds     Knee/Hip Exercises: Aerobic   Nustep Level 1 x 7 minutes-slow mobility  therapist present to discuss treatment     Knee/Hip Exercises: Standing   Knee Flexion --   Hip Abduction AROM;Both;2 sets;5 reps   Hip Extension AROM;Both;2 sets;5 sets     Knee/Hip Exercises: Seated   Sit to Sand 1 set;5 reps  Sitting on Blue mat     Knee/Hip Exercises: Supine   Short Arc Quad Sets Strengthening;20 reps  4#   Heel Slides Strengthening;10 reps;1 set   Hip Adduction Isometric Strengthening;20 reps  5 sec hold ball squeeze     Cryotherapy  Number Minutes Cryotherapy 10 Minutes  At end of session, not inlcuded in charges   Cryotherapy Location Lumbar Spine   Type of Cryotherapy Ice pack     Manual Therapy   Manual Therapy Passive ROM   Passive ROM Manual stretching to hip/ knee   knee to chest, ITB, quad stretch                  PT Short Term Goals - 08/14/16 1103      PT SHORT TERM GOAL #3   Title initiate cardio exercise in the pool with walking > or = to 2x/wk   Time 4   Period Weeks   Status On-going     PT SHORT TERM GOAL #4   Title report a 25% reduction in Lt hip pain with standing and walking           PT Long Term Goals - 08/14/16 1105      PT LONG TERM GOAL #2   Title reduce FOTO to < or = to 55% limitation   Time 8   Period Weeks   Status On-going     PT LONG TERM GOAL #3   Title report a 30%  reduction in Lt hip pain with standing and walking   Time 8   Period Weeks   Status On-going               Plan - 08/14/16 1145    Clinical Impression Statement Pt continues to have antalgic gait and heavy reliance on cane. Pt continues to have tightness in hip and responds well to manual PROM. Pt does well with standing exercises but having audible hip popping with each rep of hip abduciton so modified to side stepping to reduce popping, although patient states " it feels better after it pops".  Pt will continue to benefit from skilled therapy for hip stability and flexibility.    Rehab Potential Good   PT Frequency 2x / week   PT Duration 8 weeks   PT Treatment/Interventions ADLs/Self Care Home Management;Cryotherapy;Electrical Stimulation;Iontophoresis 4mg /ml Dexamethasone;Functional mobility training;Stair training;Gait training;Ultrasound;Moist Heat;Therapeutic activities;Therapeutic exercise;Neuromuscular re-education;Patient/family education;Passive range of motion;Manual techniques;Dry needling;Taping   PT Next Visit Plan Gentle LE strength as tolerated, manual stretching    Consulted and Agree with Plan of Care Patient      Patient will benefit from skilled therapeutic intervention in order to improve the following deficits and impairments:  Decreased strength, Abnormal gait, Pain, Impaired flexibility, Decreased activity tolerance, Decreased endurance, Difficulty walking, Decreased range of motion  Visit Diagnosis: Pain in left hip  Other abnormalities of gait and mobility  Stiffness of left hip, not elsewhere classified  Muscle weakness (generalized)     Problem List Patient Active Problem List   Diagnosis Date Noted  . Severe obesity (BMI >= 40) ([place]) 01/15/2013  . OBESITY, MORBID 01/20/2007  . Essential hypertension 10/31/2006    Mikle Bosworth PTA 08/14/2016, 12:08 PM  [facility] [address], [place], [place], [address] Phone: [phone]   Fax:  [phone]  Name: NIKOLAY DEMETRIOU MRN: [id number] Date of Birth: 12-Oct-1955

## 2016-08-15 ENCOUNTER — Ambulatory Visit (INDEPENDENT_AMBULATORY_CARE_PROVIDER_SITE_OTHER): Payer: Self-pay | Admitting: Family Medicine

## 2016-08-16 ENCOUNTER — Ambulatory Visit: Payer: BLUE CROSS/BLUE SHIELD | Admitting: Physical Therapy

## 2016-08-16 ENCOUNTER — Encounter: Payer: Self-pay | Admitting: Physical Therapy

## 2016-08-16 DIAGNOSIS — R2689 Other abnormalities of gait and mobility: Secondary | ICD-10-CM

## 2016-08-16 DIAGNOSIS — M25552 Pain in left hip: Secondary | ICD-10-CM | POA: Diagnosis not present

## 2016-08-16 DIAGNOSIS — M25652 Stiffness of left hip, not elsewhere classified: Secondary | ICD-10-CM

## 2016-08-16 DIAGNOSIS — M6281 Muscle weakness (generalized): Secondary | ICD-10-CM

## 2016-08-16 NOTE — Therapy (Signed)
[facility] [address] [place], [place], [address] Phone: [phone]   Fax:  [phone]  Physical Therapy Treatment  Patient Details  Name: James [name] MRN: [id number] Date of Birth: 1955/11/16 Referring Provider: Dorothyann Peng, PA  Encounter Date: 08/16/2016      PT End of Session - 08/16/16 1103    Visit Number 9   Date for PT Re-Evaluation 09/12/16   PT Start Time 1059   PT Stop Time 1141   PT Time Calculation (min) 42 min   Activity Tolerance Patient tolerated treatment well   Behavior During Therapy [facility] for tasks assessed/performed      Past Medical History:  Diagnosis Date  . Arthritis   . Chicken pox   . Hypertension   . Obesity     Past Surgical History:  Procedure Laterality Date  . cartilage removal    . KNEE SURGERY  2000    There were no vitals filed for this visit.      Subjective Assessment - 08/16/16 1100    Subjective I actually went to the pool yesterday, it went fine. It was a little painful in the hip but it was fine. A little sore today.    Pertinent History Pt has wife with disability and he cares for her, Lt hip OA-significant   Limitations Standing;Walking   How long can you stand comfortably? Rt>Lt weightbearing 25 minutes   How long can you walk comfortably? using cane or walker, antalgic: limited to <5 minutes   Diagnostic tests Lt hip x-ray: significant OA- 3 years ago   Patient Stated Goals reduce hip pain, improve LE strength, lose weight   Currently in Pain? Yes   Pain Score 3    Pain Location Hip   Pain Orientation Left   Pain Descriptors / Indicators Aching;Sore   Pain Type Chronic pain   Pain Radiating Towards Rt calf   Pain Onset More than a month ago   Pain Frequency Constant   Aggravating Factors  weight bearing   Pain Relieving Factors medication, rest   Effect of Pain on Daily Activities walking limited to <5 minutes significant Lt hip pain that  limits weightbearing function                         [facility] Adult PT Treatment/Exercise - 08/16/16 0001      Therapeutic Activites    Therapeutic Activities ADL's   ADL's Standing, sit to stand, walking     Knee/Hip Exercises: Stretches   Active Hamstring Stretch Left;3 reps;20 seconds  Leg lengthening   Passive Hamstring Stretch Left;3 reps;10 seconds   Quad Stretch Both;2 reps;10 seconds   Gastroc Stretch Both;2 reps;10 seconds  Slant board   Other Knee/Hip Stretches ITB stretch 1x 10 seconds  knee straight     Knee/Hip Exercises: Aerobic   Nustep Level 1 x 7 minutes-slow mobility  therapist present to discuss treatment     Knee/Hip Exercises: Standing   Hip Abduction AROM;Both;2 sets;5 reps   Hip Extension AROM;Both;2 sets;5 sets     Knee/Hip Exercises: Seated   Sit to Sand 1 set;5 reps  Unable due to pain today     Knee/Hip Exercises: Supine   Short Arc Quad Sets Strengthening;20 reps  4#   Heel Slides Strengthening;10 reps;1 set   Hip Adduction Isometric Strengthening;20 reps  5 sec hold ball squeeze   Bridges with Clamshell Strengthening;Both;1 set;10 reps  Red band  Cryotherapy   Number Minutes Cryotherapy 15 Minutes  Conccurent with supine exercises   Cryotherapy Location Lumbar Spine   Type of Cryotherapy Ice pack     Manual Therapy   Manual Therapy Passive ROM   Passive ROM Manual stretching to hip/ knee   knee to chest, ITB, quad stretch                  PT Short Term Goals - 08/14/16 1103      PT SHORT TERM GOAL #3   Title initiate cardio exercise in the pool with walking > or = to 2x/wk   Time 4   Period Weeks   Status On-going     PT SHORT TERM GOAL #4   Title report a 25% reduction in Lt hip pain with standing and walking           PT Long Term Goals - 08/14/16 1105      PT LONG TERM GOAL #2   Title reduce FOTO to < or = to 55% limitation   Time 8   Period Weeks   Status On-going     PT LONG TERM  GOAL #3   Title report a 30% reduction in Lt hip pain with standing and walking   Time 8   Period Weeks   Status On-going               Plan - 08/16/16 1139    Clinical Impression Statement Pt presents with hip soreness after exercises at pool yesterday. Able to tolerate standing exercises well but increased pain with sit to stands today. Pt continues to benefit gfrm skilled therapy for strengthening and hip stability and flexibility.    Rehab Potential Good   PT Frequency 2x / week   PT Duration 8 weeks   PT Treatment/Interventions ADLs/Self Care Home Management;Cryotherapy;Electrical Stimulation;Iontophoresis 4mg /ml Dexamethasone;Functional mobility training;Stair training;Gait training;Ultrasound;Moist Heat;Therapeutic activities;Therapeutic exercise;Neuromuscular re-education;Patient/family education;Passive range of motion;Manual techniques;Dry needling;Taping   PT Next Visit Plan Gentle LE strength as tolerated, manual stretching    Consulted and Agree with Plan of Care Patient      Patient will benefit from skilled therapeutic intervention in order to improve the following deficits and impairments:  Decreased strength, Abnormal gait, Pain, Impaired flexibility, Decreased activity tolerance, Decreased endurance, Difficulty walking, Decreased range of motion  Visit Diagnosis: Pain in left hip  Other abnormalities of gait and mobility  Stiffness of left hip, not elsewhere classified  Muscle weakness (generalized)     Problem List Patient Active Problem List   Diagnosis Date Noted  . Severe obesity (BMI >= 40) ([place]) 01/15/2013  . OBESITY, MORBID 01/20/2007  . Essential hypertension 10/31/2006    Mikle Bosworth PTA 08/16/2016, 11:42 AM  [facility] [address] [place], [place], [address] Phone: [phone]   Fax:  [phone]  Name: James [name] MRN: [id number] Date of Birth:  Jul 02, 1955

## 2016-08-21 ENCOUNTER — Ambulatory Visit: Payer: BLUE CROSS/BLUE SHIELD | Admitting: Physical Therapy

## 2016-08-21 ENCOUNTER — Encounter: Payer: Self-pay | Admitting: Physical Therapy

## 2016-08-21 DIAGNOSIS — M25552 Pain in left hip: Secondary | ICD-10-CM | POA: Diagnosis not present

## 2016-08-21 DIAGNOSIS — R2689 Other abnormalities of gait and mobility: Secondary | ICD-10-CM

## 2016-08-21 DIAGNOSIS — M6281 Muscle weakness (generalized): Secondary | ICD-10-CM

## 2016-08-21 DIAGNOSIS — M25652 Stiffness of left hip, not elsewhere classified: Secondary | ICD-10-CM

## 2016-08-21 NOTE — Therapy (Signed)
[facility] [address] [place], [place], [address] Phone: [phone]   Fax:  [phone]  Physical Therapy Treatment  Patient Details  Name: James [name] MRN: [id number] Date of Birth: 10/30/1955 Referring Provider: Dorothyann Peng, PA  Encounter Date: 08/21/2016      PT End of Session - 08/21/16 1113    Visit Number 10   Date for PT Re-Evaluation 09/12/16   PT Start Time 1059   PT Stop Time 1140   PT Time Calculation (min) 41 min   Activity Tolerance Patient tolerated treatment well   Behavior During Therapy [facility] for tasks assessed/performed      Past Medical History:  Diagnosis Date  . Arthritis   . Chicken pox   . Hypertension   . Obesity     Past Surgical History:  Procedure Laterality Date  . cartilage removal    . KNEE SURGERY  2000    There were no vitals filed for this visit.      Subjective Assessment - 08/21/16 1109    Subjective Hip doing fair today. I feel like I'm able to sleep better at night.    Pertinent History Pt has wife with disability and he cares for her, Lt hip OA-significant   Limitations Standing;Walking   How long can you stand comfortably? Rt>Lt weightbearing 25 minutes   How long can you walk comfortably? using cane or walker, antalgic: limited to <5 minutes   Diagnostic tests Lt hip x-ray: significant OA- 3 years ago   Patient Stated Goals reduce hip pain, improve LE strength, lose weight   Currently in Pain? Yes   Pain Score 3    Pain Location Hip   Pain Orientation Left   Pain Descriptors / Indicators Aching;Sore   Pain Type Chronic pain   Pain Radiating Towards Rt calf   Pain Onset More than a month ago   Pain Frequency Constant   Aggravating Factors  weight bearing   Pain Relieving Factors medicatio, rest                         [facility] Adult PT Treatment/Exercise - 08/21/16 0001      Knee/Hip Exercises: Stretches   Passive Hamstring  Stretch Left;3 reps;10 seconds   Quad Stretch Both;2 reps;10 seconds   Gastroc Stretch Both;2 reps;10 seconds  Slant board   Other Knee/Hip Stretches ITB stretch 1x 10 seconds  knee straight     Knee/Hip Exercises: Aerobic   Nustep Level 1 x 7 minutes-slow mobility  therapist present to discuss treatment     Knee/Hip Exercises: Standing   Hip Abduction AROM;Both;2 sets;5 reps  Stepping   Hip Extension AROM;Both;2 sets;5 sets  Stepping   Lateral Step Up Left;1 set;10 reps;Hand Hold: 1;Step Height: 2"     Knee/Hip Exercises: Seated   Sit to Sand 5 reps;3 sets  No UE     Knee/Hip Exercises: Supine   Heel Slides Strengthening;10 reps;1 set   Hip Adduction Isometric Strengthening;20 reps  Added glute squeeze     Cryotherapy   Number Minutes Cryotherapy --  conccurent with supine exercises   Cryotherapy Location Lumbar Spine   Type of Cryotherapy Ice pack     Manual Therapy   Manual Therapy Passive ROM   Passive ROM Manual stretching to hip/ knee   Quad, hamstring, ITB                  PT Short  Term Goals - 08/21/16 1114      PT SHORT TERM GOAL #1   Title be independent in initial HEP   Status Achieved     PT SHORT TERM GOAL #3   Title initiate cardio exercise in the pool with walking > or = to 2x/wk   Baseline Has trouble being able to go due to disabled wife   Time 4   Period Weeks   Status On-going     PT SHORT TERM GOAL #4   Title report a 25% reduction in Lt hip pain with standing and walking   Time 4   Period Weeks   Status On-going           PT Long Term Goals - 08/14/16 1105      PT LONG TERM GOAL #2   Title reduce FOTO to < or = to 55% limitation   Time 8   Period Weeks   Status On-going     PT LONG TERM GOAL #3   Title report a 30% reduction in Lt hip pain with standing and walking   Time 8   Period Weeks   Status On-going               Plan - 08/21/16 1130    Clinical Impression Statement Pt with continued hip pain  althought pt states it is better since start of therapy. Continues to have difficulty with hip flexion. Difficulty with 2" step up unable to clear oposite foot from floor. Pt will continue to benefit from skilled thearpy for LE strength and stability.    Rehab Potential Good   PT Frequency 2x / week   PT Duration 8 weeks   PT Treatment/Interventions ADLs/Self Care Home Management;Cryotherapy;Electrical Stimulation;Iontophoresis 4mg /ml Dexamethasone;Functional mobility training;Stair training;Gait training;Ultrasound;Moist Heat;Therapeutic activities;Therapeutic exercise;Neuromuscular re-education;Patient/family education;Passive range of motion;Manual techniques;Dry needling;Taping   PT Next Visit Plan Gentle LE strength as tolerated, manual stretching    Consulted and Agree with Plan of Care Patient      Patient will benefit from skilled therapeutic intervention in order to improve the following deficits and impairments:  Decreased strength, Abnormal gait, Pain, Impaired flexibility, Decreased activity tolerance, Decreased endurance, Difficulty walking, Decreased range of motion  Visit Diagnosis: Pain in left hip  Other abnormalities of gait and mobility  Stiffness of left hip, not elsewhere classified  Muscle weakness (generalized)     Problem List Patient Active Problem List   Diagnosis Date Noted  . Severe obesity (BMI >= 40) ([place]) 01/15/2013  . OBESITY, MORBID 01/20/2007  . Essential hypertension 10/31/2006    Mikle Bosworth PTA 08/21/2016, 11:42 AM  [facility] [address] [place], [place], [address] Phone: [phone]   Fax:  [phone]  Name: James [name] MRN: [id number] Date of Birth: June 01, 1955

## 2016-08-23 ENCOUNTER — Ambulatory Visit: Payer: BLUE CROSS/BLUE SHIELD

## 2016-08-23 DIAGNOSIS — M6281 Muscle weakness (generalized): Secondary | ICD-10-CM

## 2016-08-23 DIAGNOSIS — M25652 Stiffness of left hip, not elsewhere classified: Secondary | ICD-10-CM

## 2016-08-23 DIAGNOSIS — M25552 Pain in left hip: Secondary | ICD-10-CM | POA: Diagnosis not present

## 2016-08-23 DIAGNOSIS — R2689 Other abnormalities of gait and mobility: Secondary | ICD-10-CM

## 2016-08-23 NOTE — Therapy (Signed)
[facility] [address] [place], [place], [address] Phone: [phone]   Fax:  [phone]  Physical Therapy Treatment  Patient Details  Name: James [name] MRN: [id number] Date of Birth: 14-May-1955 Referring Provider: Dorothyann Peng, PA  Encounter Date: 08/23/2016      PT End of Session - 08/23/16 1125    Visit Number 11   Date for PT Re-Evaluation 09/12/16   PT Start Time 1100   PT Stop Time 1140   PT Time Calculation (min) 40 min   Activity Tolerance Patient tolerated treatment well   Behavior During Therapy [facility] for tasks assessed/performed      Past Medical History:  Diagnosis Date  . Arthritis   . Chicken pox   . Hypertension   . Obesity     Past Surgical History:  Procedure Laterality Date  . cartilage removal    . KNEE SURGERY  2000    There were no vitals filed for this visit.      Subjective Assessment - 08/23/16 1103    Subjective I feel like I am getting better.  Not as fast as I would like.     Pertinent History Pt has wife with disability and he cares for her, Lt hip OA-significant   Currently in Pain? No/denies                         [facility] Adult PT Treatment/Exercise - 08/23/16 0001      Knee/Hip Exercises: Stretches   Passive Hamstring Stretch Left;3 reps;10 seconds   Quad Stretch Both;2 reps;10 seconds   Gastroc Stretch Both;2 reps;10 seconds  Slant board   Other Knee/Hip Stretches ITB stretch 1x 10 seconds  knee straight     Knee/Hip Exercises: Aerobic   Nustep Level 1 x 8 minutes-slow mobility  therapist present to discuss treatment     Knee/Hip Exercises: Standing   Hip Abduction AROM;Both;2 sets;5 reps  Stepping   Hip Extension AROM;Both;2 sets;5 sets  Stepping   Lateral Step Up Left;1 set;10 reps;Hand Hold: 1;Step Height: 2"     Knee/Hip Exercises: Seated   Sit to Sand 5 reps;3 sets  No UE     Knee/Hip Exercises: Supine   Heel Slides  Strengthening;10 reps;1 set   Hip Adduction Isometric Strengthening;20 reps  Added glute squeeze     Cryotherapy   Number Minutes Cryotherapy --  concurrent with supine exercise   Cryotherapy Location Lumbar Spine   Type of Cryotherapy Ice pack                  PT Short Term Goals - 08/21/16 1114      PT SHORT TERM GOAL #1   Title be independent in initial HEP   Status Achieved     PT SHORT TERM GOAL #3   Title initiate cardio exercise in the pool with walking > or = to 2x/wk   Baseline Has trouble being able to go due to disabled wife   Time 4   Period Weeks   Status On-going     PT SHORT TERM GOAL #4   Title report a 25% reduction in Lt hip pain with standing and walking   Time 4   Period Weeks   Status On-going           PT Long Term Goals - 08/14/16 1105      PT LONG TERM GOAL #2   Title reduce FOTO to <  or = to 55% limitation   Time 8   Period Weeks   Status On-going     PT LONG TERM GOAL #3   Title report a 30% reduction in Lt hip pain with standing and walking   Time 8   Period Weeks   Status On-going               Plan - 08/23/16 1124    Clinical Impression Statement Pt with no pain today.  Pt with continued mobility challenges due to stiffness, weakness and deconditioning.  Difficulty with 2" step up on Lt due to weakness.  Pt will continue to benefit from skilled PT for Lt hip flexibility, strength, and mobility.     Rehab Potential Good   PT Frequency 2x / week   PT Duration 8 weeks   PT Treatment/Interventions ADLs/Self Care Home Management;Cryotherapy;Electrical Stimulation;Iontophoresis 4mg /ml Dexamethasone;Functional mobility training;Stair training;Gait training;Ultrasound;Moist Heat;Therapeutic activities;Therapeutic exercise;Neuromuscular re-education;Patient/family education;Passive range of motion;Manual techniques;Dry needling;Taping   PT Next Visit Plan Gentle LE strength as tolerated, manual stretching    Consulted and  Agree with Plan of Care Patient      Patient will benefit from skilled therapeutic intervention in order to improve the following deficits and impairments:  Decreased strength, Abnormal gait, Pain, Impaired flexibility, Decreased activity tolerance, Decreased endurance, Difficulty walking, Decreased range of motion  Visit Diagnosis: Pain in left hip  Other abnormalities of gait and mobility  Stiffness of left hip, not elsewhere classified  Muscle weakness (generalized)     Problem List Patient Active Problem List   Diagnosis Date Noted  . Severe obesity (BMI >= 40) ([place]) 01/15/2013  . OBESITY, MORBID 01/20/2007  . Essential hypertension 10/31/2006    James [name], PT 08/23/16 11:35 AM  [facility] [address] [place], [place], [address] Phone: [phone]   Fax:  [phone]  Name: James [name] MRN: [id number] Date of Birth: 02-11-1956

## 2016-08-28 ENCOUNTER — Ambulatory Visit: Payer: BLUE CROSS/BLUE SHIELD | Admitting: Physical Therapy

## 2016-08-28 ENCOUNTER — Encounter: Payer: Self-pay | Admitting: Physical Therapy

## 2016-08-28 DIAGNOSIS — M25652 Stiffness of left hip, not elsewhere classified: Secondary | ICD-10-CM

## 2016-08-28 DIAGNOSIS — R2689 Other abnormalities of gait and mobility: Secondary | ICD-10-CM

## 2016-08-28 DIAGNOSIS — M25552 Pain in left hip: Secondary | ICD-10-CM | POA: Diagnosis not present

## 2016-08-28 DIAGNOSIS — M6281 Muscle weakness (generalized): Secondary | ICD-10-CM

## 2016-08-28 NOTE — Therapy (Signed)
[facility] [address], [place], [place], [address] Phone: [phone]   Fax:  [phone]  Physical Therapy Treatment  Patient Details  Name: James [name] MRN: [id number] Date of Birth: 08-18-1955 Referring Provider: Dorothyann Peng, PA  Encounter Date: 08/28/2016      PT End of Session - 08/28/16 1130    Visit Number 12   Date for PT Re-Evaluation 09/12/16   PT Start Time 1055   PT Stop Time 1138   PT Time Calculation (min) 43 min   Activity Tolerance Patient tolerated treatment well   Behavior During Therapy [facility] for tasks assessed/performed      Past Medical History:  Diagnosis Date  . Arthritis   . Chicken pox   . Hypertension   . Obesity     Past Surgical History:  Procedure Laterality Date  . cartilage removal    . KNEE SURGERY  2000    There were no vitals filed for this visit.      Subjective Assessment - 08/28/16 1119    Subjective This rain is making everything worse today.    Pertinent History Pt has wife with disability and he cares for her, Lt hip OA-significant   Limitations Standing;Walking   How long can you stand comfortably? Rt>Lt weightbearing 25 minutes   How long can you walk comfortably? using cane or walker, antalgic: limited to <5 minutes   Diagnostic tests Lt hip x-ray: significant OA- 3 years ago   Patient Stated Goals reduce hip pain, improve LE strength, lose weight   Currently in Pain? Yes   Pain Score 5    Pain Location Hip   Pain Orientation Left   Pain Descriptors / Indicators Aching;Sore   Pain Type Chronic pain   Pain Radiating Towards Rt calf   Pain Onset More than a month ago   Pain Frequency Constant   Aggravating Factors  weight bearing   Pain Relieving Factors medicaiton, rest   Effect of Pain on Daily Activities walking limited to <5 minutes significant   Multiple Pain Sites No                         [facility] Adult PT  Treatment/Exercise - 08/28/16 0001      Knee/Hip Exercises: Stretches   Passive Hamstring Stretch Left;3 reps;10 seconds  green strap   Quad Stretch Both;2 reps;10 seconds   Gastroc Stretch Both;2 reps;10 seconds  Slant board   Other Knee/Hip Stretches ITB stretch 1x 10 seconds  knee straight; green strap     Knee/Hip Exercises: Aerobic   Nustep Level 1 x 10 minutes-slow mobility  therapist present to discuss treatment     Knee/Hip Exercises: Standing   Hip Flexion 20 reps;Knee bent  Marching   Hip Abduction AROM;Both;2 sets;5 reps  Stepping   Hip Extension AROM;Both;2 sets;5 sets  Stepping     Knee/Hip Exercises: Seated   Long Arc Quad Strengthening;2 sets;10 reps   Sit to [employer] 5 reps;3 sets  No UE     Knee/Hip Exercises: Supine   Hip Adduction Isometric Strengthening;20 reps  Added glute squeeze     Cryotherapy   Number Minutes Cryotherapy --  concurrent with Supine exercises   Cryotherapy Location Lumbar Spine   Type of Cryotherapy Ice pack                  PT Short Term Goals - 08/28/16 1130  PT SHORT TERM GOAL #2   Title improve LE strength to perform sit to stand with moderate UE support   Status Achieved     PT SHORT TERM GOAL #3   Title initiate cardio exercise in the pool with walking > or = to 2x/wk   Baseline Has trouble being able to go due to disabled wife   Time 4   Period Weeks   Status On-going     PT SHORT TERM GOAL #4   Title report a 25% reduction in Lt hip pain with standing and walking   Baseline 10%   Time 4   Period Weeks   Status On-going           PT Long Term Goals - 08/28/16 1132      PT LONG TERM GOAL #1   Title be independent in advanced HEP   Time 8   Period Weeks   Status On-going     PT LONG TERM GOAL #2   Title reduce FOTO to < or = to 55% limitation   Time 8   Period Weeks   Status On-going               Plan - 08/28/16 1137    Clinical Impression Statement Pt presents with  increased hip pain today due to weather. Pt reports he felt great all weekend and then had pain when it became rainy. Pt stated he was able to get to the pool a couple of times over the past week. Pt did well with all exercises today. Will continue to benefit from skilled therapy for hip strength and stability.   Rehab Potential Good   PT Frequency 2x / week   PT Duration 8 weeks   PT Treatment/Interventions ADLs/Self Care Home Management;Cryotherapy;Electrical Stimulation;Iontophoresis 4mg /ml Dexamethasone;Functional mobility training;Stair training;Gait training;Ultrasound;Moist Heat;Therapeutic activities;Therapeutic exercise;Neuromuscular re-education;Patient/family education;Passive range of motion;Manual techniques;Dry needling;Taping   PT Next Visit Plan Hip mobility, weight shifting on rebounder   Consulted and Agree with Plan of Care Patient      Patient will benefit from skilled therapeutic intervention in order to improve the following deficits and impairments:  Decreased strength, Abnormal gait, Pain, Impaired flexibility, Decreased activity tolerance, Decreased endurance, Difficulty walking, Decreased range of motion  Visit Diagnosis: Pain in left hip  Other abnormalities of gait and mobility  Stiffness of left hip, not elsewhere classified  Muscle weakness (generalized)     Problem List Patient Active Problem List   Diagnosis Date Noted  . Severe obesity (BMI >= 40) ([place]) 01/15/2013  . OBESITY, MORBID 01/20/2007  . Essential hypertension 10/31/2006    James [name] 08/28/2016, 11:44 AM  [facility] [address], [place], [place], [address] Phone: [phone]   Fax:  [phone]  Name: TOMOYA [name] MRN: [id number] Date of Birth: 1955-06-27

## 2016-08-30 ENCOUNTER — Ambulatory Visit: Payer: BLUE CROSS/BLUE SHIELD | Admitting: Physical Therapy

## 2016-08-30 DIAGNOSIS — M25552 Pain in left hip: Secondary | ICD-10-CM | POA: Diagnosis not present

## 2016-08-30 DIAGNOSIS — R2689 Other abnormalities of gait and mobility: Secondary | ICD-10-CM

## 2016-08-30 DIAGNOSIS — M6281 Muscle weakness (generalized): Secondary | ICD-10-CM

## 2016-08-30 DIAGNOSIS — M25652 Stiffness of left hip, not elsewhere classified: Secondary | ICD-10-CM

## 2016-08-30 NOTE — Therapy (Signed)
[facility] [address] [place], [place], [address] Phone: [phone]   Fax:  [phone]  Physical Therapy Treatment  Patient Details  Name: James [name] MRN: [id number] Date of Birth: August 13, 1955 Referring Provider: Dorothyann Peng, PA  Encounter Date: 08/30/2016      PT End of Session - 08/30/16 1307    Visit Number 13   Date for PT Re-Evaluation 09/12/16   PT Start Time 1230   PT Stop Time 1309   PT Time Calculation (min) 39 min   Activity Tolerance Patient limited by pain      Past Medical History:  Diagnosis Date  . Arthritis   . Chicken pox   . Hypertension   . Obesity     Past Surgical History:  Procedure Laterality Date  . cartilage removal    . KNEE SURGERY  2000    There were no vitals filed for this visit.      Subjective Assessment - 08/30/16 1229    Subjective Patient complains of increased left posterior hip/back/buttock    Currently in Pain? Yes   Pain Score 7    Pain Location Hip   Pain Orientation Left   Pain Type Chronic pain   Pain Onset More than a month ago   Pain Frequency Constant                         [facility] Adult PT Treatment/Exercise - 08/30/16 0001      Knee/Hip Exercises: Stretches   Active Hamstring Stretch Left;3 reps;20 seconds  on floor seated   Gastroc Stretch Both;2 reps;10 seconds  Slant board     Knee/Hip Exercises: Aerobic   Nustep --  hold per patient request     Knee/Hip Exercises: Standing   Hip Abduction AROM;Right;Left;10 reps   Hip Extension AROM;Right;Left;10 reps   Other Standing Knee Exercises Hip swings 5# 25x      Knee/Hip Exercises: Seated   Long Arc Quad Strengthening;Right;Left;10 reps   Long Arc Quad Limitations red band    Ball Squeeze 20x   Clamshell with TheraBand Green  3x10   Knee/Hip Flexion red band ankle dorsiflexion with hip flexion 10x right/left      Manual Therapy   Joint Mobilization left  hip long axis distraction grade 3:  inferior, distraction bent knee;  sidelying neutral gapping and pelvic distraction 10x each grade 3   using belt   Soft tissue mobilization left gluteals                   PT Short Term Goals - 08/30/16 1318      PT SHORT TERM GOAL #1   Title be independent in initial HEP   Status Achieved     PT SHORT TERM GOAL #2   Title improve LE strength to perform sit to stand with moderate UE support   Status Achieved           PT Long Term Goals - 08/30/16 1318      PT LONG TERM GOAL #1   Title be independent in advanced HEP   Time 8   Period Weeks   Status On-going     PT LONG TERM GOAL #2   Title reduce FOTO to < or = to 55% limitation   Time 8   Period Weeks   Status On-going     PT LONG TERM GOAL #3   Title report a 30% reduction  in Lt hip pain with standing and walking   Time 8   Period Weeks   Status On-going     PT LONG TERM GOAL #4   Title improve LE strength to perform sit to stand with minimal to no UE support   Time 8   Period Weeks   Status On-going     PT LONG TERM GOAL #5   Title report compliance with regular strength and cardio exercise for weight loss   Time 8   Period Weeks   Status On-going               Plan - 08/30/16 1307    Clinical Impression Statement Patient presents complaining of increased left posterior hip pain for no apparent reason.  Treatment focus on pain relief and low level hip strengthening, hold on some of his usual ex's secondary to complaints of increased pain today.  He does report that he feels better by the end of the treatment session.     PT Treatment/Interventions ADLs/Self Care Home Management;Cryotherapy;Electrical Stimulation;Iontophoresis 4mg /ml Dexamethasone;Functional mobility training;Stair training;Gait training;Ultrasound;Moist Heat;Therapeutic activities;Therapeutic exercise;Neuromuscular re-education;Patient/family education;Passive range of motion;Manual  techniques;Dry needling;Taping   PT Next Visit Plan Hip mobility, weight shifting on rebounder;  hip distraction techniques manually or with ex      Patient will benefit from skilled therapeutic intervention in order to improve the following deficits and impairments:  Decreased strength, Abnormal gait, Pain, Impaired flexibility, Decreased activity tolerance, Decreased endurance, Difficulty walking, Decreased range of motion  Visit Diagnosis: Pain in left hip  Other abnormalities of gait and mobility  Stiffness of left hip, not elsewhere classified  Muscle weakness (generalized)     Problem List Patient Active Problem List   Diagnosis Date Noted  . Severe obesity (BMI >= 40) ([place]) 01/15/2013  . OBESITY, MORBID 01/20/2007  . Essential hypertension 10/31/2006   Ruben Im, PT 08/30/16 1:21 PM Phone: [phone] Fax: [phone]  Alvera Singh 08/30/2016, 1:20 PM  [facility] [address] [place], [place], [address] Phone: [phone]   Fax:  [phone]  Name: James [name] MRN: [id number] Date of Birth: 06-Nov-1955

## 2016-09-03 ENCOUNTER — Ambulatory Visit: Payer: BLUE CROSS/BLUE SHIELD | Admitting: Physical Therapy

## 2016-09-03 ENCOUNTER — Encounter: Payer: Self-pay | Admitting: Physical Therapy

## 2016-09-03 DIAGNOSIS — M25552 Pain in left hip: Secondary | ICD-10-CM | POA: Diagnosis not present

## 2016-09-03 DIAGNOSIS — M6281 Muscle weakness (generalized): Secondary | ICD-10-CM

## 2016-09-03 DIAGNOSIS — R2689 Other abnormalities of gait and mobility: Secondary | ICD-10-CM

## 2016-09-03 DIAGNOSIS — M25652 Stiffness of left hip, not elsewhere classified: Secondary | ICD-10-CM

## 2016-09-03 NOTE — Therapy (Signed)
[facility] [address], [place], [place], [address] Phone: [phone]   Fax:  [phone]  Physical Therapy Treatment  Patient Details  Name: James [name] MRN: [id number] Date of Birth: Mar 15, 1956 Referring Provider: Dorothyann Peng, PA  Encounter Date: 09/03/2016      PT End of Session - 09/03/16 1151    Visit Number 14   Date for PT Re-Evaluation 09/12/16   PT Start Time 1143   PT Stop Time 1228   PT Time Calculation (min) 45 min   Activity Tolerance Patient limited by pain   Behavior During Therapy [facility] for tasks assessed/performed      Past Medical History:  Diagnosis Date  . Arthritis   . Chicken pox   . Hypertension   . Obesity     Past Surgical History:  Procedure Laterality Date  . cartilage removal    . KNEE SURGERY  2000    There were no vitals filed for this visit.      Subjective Assessment - 09/03/16 1146    Subjective Patient reports continued hip tightness. Less pain than last time but increased pain with walking and trasnfers.    Pertinent History Pt has wife with disability and he cares for her, Lt hip OA-significant   Limitations Standing;Walking   How long can you stand comfortably? Rt>Lt weightbearing 25 minutes   How long can you walk comfortably? using cane or walker, antalgic: limited to <5 minutes   Diagnostic tests Lt hip x-ray: significant OA- 3 years ago   Patient Stated Goals reduce hip pain, improve LE strength, lose weight   Currently in Pain? Yes   Pain Score 5    Pain Location Hip   Pain Orientation Left   Pain Descriptors / Indicators Aching;Sore   Pain Type Chronic pain   Pain Radiating Towards Rt calf   Pain Onset More than a month ago   Pain Frequency Constant   Aggravating Factors  weight bearing   Pain Relieving Factors medication, rest   Effect of Pain on Daily Activities walking limited to <5 minutes significant                          [facility] Adult PT Treatment/Exercise - 09/03/16 0001      Knee/Hip Exercises: Stretches   Active Hamstring Stretch Left;3 reps;20 seconds  on floor seated   Quad Stretch Both;2 reps;10 seconds   Gastroc Stretch Both;2 reps;10 seconds  Slant board     Knee/Hip Exercises: Standing   Hip Abduction AROM;Right;Left;10 reps   Hip Extension AROM;Right;Left;10 reps   Other Standing Knee Exercises Hip swings 5# 25x      Knee/Hip Exercises: Seated   Long Arc Quad Strengthening;Right;Left;10 reps   Long Arc Quad Limitations red band    Ball Squeeze 20x   Clamshell with TheraBand Green  3x10   Knee/Hip Flexion green band ankle dorsiflexion with hip flexion 10x right/left      Cryotherapy   Number Minutes Cryotherapy --  Conccurent with supine exercises   Cryotherapy Location Lumbar Spine   Type of Cryotherapy Ice pack     Manual Therapy   Joint Mobilization left hip long axis distraction grade 2:  inferior, distraction bent knee;  sidelying neutral gapping and pelvic distraction 10x each grade 2  using belt   Soft tissue mobilization left gluteals   IASTM  PT Short Term Goals - 08/30/16 1318      PT SHORT TERM GOAL #1   Title be independent in initial HEP   Status Achieved     PT SHORT TERM GOAL #2   Title improve LE strength to perform sit to stand with moderate UE support   Status Achieved           PT Long Term Goals - 08/30/16 1318      PT LONG TERM GOAL #1   Title be independent in advanced HEP   Time 8   Period Weeks   Status On-going     PT LONG TERM GOAL #2   Title reduce FOTO to < or = to 55% limitation   Time 8   Period Weeks   Status On-going     PT LONG TERM GOAL #3   Title report a 30% reduction in Lt hip pain with standing and walking   Time 8   Period Weeks   Status On-going     PT LONG TERM GOAL #4   Title improve LE strength to perform sit to stand with minimal to no UE support    Time 8   Period Weeks   Status On-going     PT LONG TERM GOAL #5   Title report compliance with regular strength and cardio exercise for weight loss   Time 8   Period Weeks   Status On-going               Plan - 09/03/16 1210    Clinical Impression Statement Pt presents with continued complaints of hip soreness and pain, not sure why. Patient able to tolerate all exercises well today with no increased pain just muscle fatigue and weakness. Patinet will continue to benefit from skilled therapy for hip strength and stability.    Rehab Potential Good   PT Frequency 2x / week   PT Duration 8 weeks   PT Treatment/Interventions ADLs/Self Care Home Management;Cryotherapy;Electrical Stimulation;Iontophoresis 4mg /ml Dexamethasone;Functional mobility training;Stair training;Gait training;Ultrasound;Moist Heat;Therapeutic activities;Therapeutic exercise;Neuromuscular re-education;Patient/family education;Passive range of motion;Manual techniques;Dry needling;Taping   PT Next Visit Plan Hip mobility, weight shifting on rebounder;  hip distraction techniques manually or with ex   Consulted and Agree with Plan of Care Patient      Patient will benefit from skilled therapeutic intervention in order to improve the following deficits and impairments:  Decreased strength, Abnormal gait, Pain, Impaired flexibility, Decreased activity tolerance, Decreased endurance, Difficulty walking, Decreased range of motion  Visit Diagnosis: Pain in left hip  Other abnormalities of gait and mobility  Stiffness of left hip, not elsewhere classified  Muscle weakness (generalized)     Problem List Patient Active Problem List   Diagnosis Date Noted  . Severe obesity (BMI >= 40) ([place]) 01/15/2013  . OBESITY, MORBID 01/20/2007  . Essential hypertension 10/31/2006    Mikle Bosworth PTA 09/03/2016, 1:04 PM  [facility] [address] [place], [place], [address] Phone: [phone]   Fax:  [phone]  Name: James [name] MRN: [id number] Date of Birth: [birth year]

## 2016-09-04 ENCOUNTER — Ambulatory Visit (INDEPENDENT_AMBULATORY_CARE_PROVIDER_SITE_OTHER): Payer: BLUE CROSS/BLUE SHIELD | Admitting: Family Medicine

## 2016-09-04 ENCOUNTER — Encounter (INDEPENDENT_AMBULATORY_CARE_PROVIDER_SITE_OTHER): Payer: Self-pay | Admitting: Family Medicine

## 2016-09-04 VITALS — BP 146/87 | HR 60 | Temp 98.0°F | Resp 12 | Ht 65.0 in | Wt 305.0 lb

## 2016-09-04 DIAGNOSIS — Z9189 Other specified personal risk factors, not elsewhere classified: Secondary | ICD-10-CM

## 2016-09-04 DIAGNOSIS — I10 Essential (primary) hypertension: Secondary | ICD-10-CM | POA: Diagnosis not present

## 2016-09-04 DIAGNOSIS — R0602 Shortness of breath: Secondary | ICD-10-CM

## 2016-09-04 DIAGNOSIS — Z1389 Encounter for screening for other disorder: Secondary | ICD-10-CM

## 2016-09-04 DIAGNOSIS — Z1331 Encounter for screening for depression: Secondary | ICD-10-CM

## 2016-09-04 DIAGNOSIS — R5383 Other fatigue: Secondary | ICD-10-CM | POA: Diagnosis not present

## 2016-09-04 DIAGNOSIS — Z0289 Encounter for other administrative examinations: Secondary | ICD-10-CM

## 2016-09-04 DIAGNOSIS — R739 Hyperglycemia, unspecified: Secondary | ICD-10-CM | POA: Diagnosis not present

## 2016-09-04 NOTE — Progress Notes (Signed)
Office: [phone]  /  Fax: [phone]   Dear Dorothyann Peng, AGNP-C,   Thank you for referring James [name] to our clinic. The following note includes my evaluation and treatment recommendations.  HPI:   Chief Complaint: OBESITY  James [name] has been referred by Dorothyann Peng, AGNP-C for consultation regarding his obesity and obesity related comorbidities.  James [name] (MR# [id number]) is a 61 y.o. male who presents on 09/04/2016 for obesity evaluation and treatment. Current BMI is Body mass index is 50.75 kg/m.James [name] has struggled with obesity for years and has been unsuccessful in either losing weight or maintaining long term weight loss. James [name] attended our information session and states he is currently in the action stage of change and ready to dedicate time achieving and maintaining a healthier weight.  James [name] states his family eats meals together he thinks his family will eat healthier with  him his desired weight loss is 105 lbs he has been heavy most of  his life he started gaining weight over the last 15 years his heaviest weight ever was 370 lbs. he frequently eats larger portions than normal  he has binge eating behaviors he struggles with emotional eating    Fatigue James [name] feels his energy is lower than it should be. This has worsened with weight gain and has not worsened recently. James [name] admits to daytime somnolence and  admits to waking up still tired. Patient is at risk for obstructive sleep apnea. Patent has a history of symptoms of daytime fatigue, Epworth sleepiness scale, morning headache and hypertension. Patient generally gets 6 hours of sleep per night, and states they generally have restless sleep. Snoring is present. Apneic episodes are present. Epworth Sleepiness Score is 15  Dyspnea on exertion James [name] notes increasing shortness of breath with exercising and seems to be worsening over time with weight gain. He notes getting out of breath sooner with  activity than he used to. This has not gotten worse recently. Delphin denies orthopnea.  Hypertension KOLBEY TEICHERT is a 61 y.o. male with hypertension.  James [name] denies chest pain. He is working weight loss to help control his blood pressure with the goal of decreasing his risk of heart attack and stroke. James [name] blood pressure is not currently controlled.  Hyperglycemia James [name] has a history of some elevated blood glucose readings and A1c without a diagnosis of diabetes. He admits to polyphagia.  At risk for diabetes James [name] is at higher than average risk for developing diabetes due to his obesity. He currently denies polyuria or polydipsia.  Depression Screen James [name] Food and Mood (modified PHQ-9) score was  Depression screen PHQ 2/9 09/04/2016  Decreased Interest 3  Down, Depressed, Hopeless 2  PHQ - 2 Score 5  Altered sleeping 1  Tired, decreased energy 3  Change in appetite 3  Feeling bad or failure about yourself  0  Trouble concentrating 1  Moving slowly or fidgety/restless 1  Suicidal thoughts 0  PHQ-9 Score 14    ALLERGIES: No Known Allergies  MEDICATIONS: Current Outpatient Prescriptions on File Prior to Visit  Medication Sig Dispense Refill  . Acetaminophen (TYLENOL PO) Take by mouth.    . cyclobenzaprine (FLEXERIL) 10 MG tablet Take 1 tablet (10 mg total) by mouth 3 (three) times daily as needed for muscle spasms. 30 tablet 0  . hydrochlorothiazide (HYDRODIURIL) 25 MG tablet TAKE 1 TABLET (25 MG TOTAL) BY MOUTH DAILY. 90 tablet 0  . lisinopril (PRINIVIL,ZESTRIL) 5 MG tablet TAKE 1 TABLET (5  MG TOTAL) BY MOUTH DAILY. 90 tablet 1  . meloxicam (MOBIC) 15 MG tablet Take 1 tablet (15 mg total) by mouth daily as needed for pain. 90 tablet 0  . Multiple Vitamins-Minerals (MULTIVITAMIN WITH MINERALS) tablet Take 1 tablet by mouth daily.     No current facility-administered medications on file prior to visit.     PAST MEDICAL HISTORY: Past Medical History:    Diagnosis Date  . Arthritis   . Chicken pox   . Hypertension   . Joint pain   . Obesity   . Osteoarthritis   . Prediabetes   . Swelling    feet and legs    PAST SURGICAL HISTORY: Past Surgical History:  Procedure Laterality Date  . cartilage removal    . KNEE SURGERY  2000    SOCIAL HISTORY: Social History  Substance Use Topics  . Smoking status: Former Smoker    Quit date: 07/16/1992  . Smokeless tobacco: Never Used  . Alcohol use Yes     Comment: very little per patient     FAMILY HISTORY: Family History  Problem Relation Age of Onset  . Cancer Mother     lung and soft tissue cancer from radiation  . Stroke Mother   . Obesity Mother   . Heart disease Father     CHF  . Diabetes Father   . Hypertension Father   . Obesity Father   . Heart disease Maternal Grandfather   . Diabetes Paternal Grandmother   . Ovarian cancer Sister     ROS: Review of Systems  Constitutional: Positive for malaise/fatigue.  Eyes:       Wear Glasses or Contacts  Respiratory: Positive for shortness of breath (with exertion).   Cardiovascular: Negative for chest pain and orthopnea.  Genitourinary: Negative for frequency.  Musculoskeletal: Positive for joint pain and myalgias.       Muscle stiffness  Endo/Heme/Allergies: Negative for polydipsia.       Polyphagia  Psychiatric/Behavioral:       Stress    PHYSICAL EXAM: Blood pressure (!) 146/87, pulse 60, temperature 98 F (36.7 C), temperature source Oral, resp. rate 12, height 5\' 5"  (1.651 m), weight (!) 305 lb (138.3 kg), SpO2 95 %. Body mass index is 50.75 kg/m. Physical Exam  Constitutional: He is oriented to person, place, and time. He appears well-developed and well-nourished.  Cardiovascular: Normal rate.   Pulmonary/Chest: Effort normal.  Musculoskeletal: Normal range of motion.  Neurological: He is oriented to person, place, and time.  Skin: Skin is warm and dry.  Vitals reviewed.   RECENT LABS AND  TESTS: BMET    Component Value Date/Time   NA 140 09/13/2015 0910   K 4.2 09/13/2015 0910   CL 102 09/13/2015 0910   CO2 29 09/13/2015 0910   GLUCOSE 108 (H) 09/13/2015 0910   BUN 23 09/13/2015 0910   CREATININE 0.84 09/13/2015 0910   CALCIUM 9.4 09/13/2015 0910   GFRNONAA 75 11/14/2006 0928   GFRAA 90 11/14/2006 0928   Lab Results  Component Value Date   HGBA1C 5.9 09/13/2015   No results found for: INSULIN CBC    Component Value Date/Time   WBC 6.4 09/13/2015 0910   RBC 4.96 09/13/2015 0910   HGB 14.8 09/13/2015 0910   HCT 44.0 09/13/2015 0910   PLT 200.0 09/13/2015 0910   MCV 88.7 09/13/2015 0910   MCHC 33.6 09/13/2015 0910   RDW 14.4 09/13/2015 0910   LYMPHSABS 1.6 09/13/2015 0910   MONOABS  0.5 09/13/2015 0910   EOSABS 0.0 09/13/2015 0910   BASOSABS 0.0 09/13/2015 0910   Iron/TIBC/Ferritin/ %Sat No results found for: IRON, TIBC, FERRITIN, IRONPCTSAT Lipid Panel     Component Value Date/Time   CHOL 192 09/13/2015 0910   TRIG 71.0 09/13/2015 0910   HDL 54.90 09/13/2015 0910   CHOLHDL 3 09/13/2015 0910   VLDL 14.2 09/13/2015 0910   LDLCALC 122 (H) 09/13/2015 0910   Hepatic Function Panel     Component Value Date/Time   PROT 7.3 11/14/2006 0928   ALBUMIN 3.7 11/14/2006 0928   AST 24 11/14/2006 0928   ALT 42 11/14/2006 0928   ALKPHOS 61 11/14/2006 0928   BILITOT 0.9 11/14/2006 0928   BILIDIR 0.1 11/14/2006 0928      Component Value Date/Time   TSH 1.42 11/30/2013 1318   TSH 1.95 11/14/2006 0928    ECG  shows NSR with a rate of 63 BPM INDIRECT CALORIMETER done today shows a VO2 of 321 and a REE of 233.    ASSESSMENT AND PLAN: Other fatigue - Plan: EKG 12-Lead, Comprehensive metabolic panel, CBC with Differential/Platelet, Lipid Panel With LDL/HDL Ratio, VITAMIN D 25 Hydroxy (Vit-D Deficiency, Fractures), Vitamin B12, Folate, TSH, T4, free, T3  SOB (shortness of breath) on exertion  Essential hypertension  Hyperglycemia - Plan: Hemoglobin A1c,  Insulin, random  At risk for diabetes mellitus  Depression screening  Morbid obesity ([place])  PLAN: Fatigue Tremane was informed that his fatigue may be related to obesity, depression or many other causes. Labs will be ordered, and in the meanwhile Luc has agreed to work on diet, exercise and weight loss to help with fatigue. Proper sleep hygiene was discussed including the need for 7-8 hours of quality sleep each night. A sleep study was not ordered based on symptoms and Epworth score.  Dyspnea on exertion Morrie's shortness of breath appears to be obesity related and exercise induced. He has agreed to work on weight loss and gradually increase exercise to treat his exercise induced shortness of breath. If Goro follows our instructions and loses weight without improvement of his shortness of breath, we will plan to refer to pulmonology. We will monitor this condition regularly. Irl agrees to this plan.  Hypertension We discussed sodium restriction, working on healthy weight loss, and a regular exercise program as the means to achieve improved blood pressure control. James [name] agreed with this plan and agreed to follow up as directed. We will continue to monitor his blood pressure as well as his progress with the above lifestyle modifications. He will continue his medications as prescribed and will watch for signs of hypotension as he continues his lifestyle modifications.  Hyperglycemia Fasting labs will be obtained and results with be discussed with James [name] in 2 weeks at his follow up visit. In the meanwhile Uzair was started on a lower simple carbohydrate diet and will work on weight loss efforts.  Diabetes risk counselling Dion was given extended (at least 15 minutes) diabetes prevention counseling today. He is 61 y.o. male and has risk factors for diabetes including obesity and hyperglycemia. We discussed intensive lifestyle modifications today with an emphasis on weight loss as well as  increasing exercise and decreasing simple carbohydrates in his diet.  Depression Screen Anfernee had a moderately positive depression screening. Depression is commonly associated with obesity and often results in emotional eating behaviors. We will monitor this closely and work on CBT to help improve the non-hunger eating patterns. Referral to Psychology may be required if  no improvement is seen as he continues in our clinic.  Obesity Emeril is currently in the action stage of change and his goal is to continue with weight loss efforts. I recommend Beckam begin the structured treatment plan as follows:  He has agreed to follow the Category 3 plan Kaio has been instructed to eventually work up to a goal of 150 minutes of combined cardio and strengthening exercise per week for weight loss and overall health benefits. We discussed the following Behavioral Modification Stratagies today: increasing lean protein intake, work on meal planning and easy cooking plans and dealing with family or coworker sabotage  Oney has agreed to join our obesity program and follow up with our clinic in 2 weeks. He was informed of the importance of frequent follow up visits to maximize his success with intensive lifestyle modifications for his multiple health conditions. He was informed we would discuss his lab results at his next visit unless there is a critical issue that needs to be addressed sooner. Romolo agreed to keep his next visit at the agreed upon time to discuss these results.  I, Doreene Nest, am acting as scribe for Dennard Nip, MD  I have reviewed the above documentation for accuracy and completeness, and I agree with the above. -Dennard Nip, MD

## 2016-09-05 LAB — TSH: TSH: 1.83 u[IU]/mL (ref 0.450–4.500)

## 2016-09-05 LAB — HEMOGLOBIN A1C
Est. average glucose Bld gHb Est-mCnc: 117 mg/dL
Hgb A1c MFr Bld: 5.7 % — ABNORMAL HIGH (ref 4.8–5.6)

## 2016-09-05 LAB — T4, FREE: Free T4: 1.12 ng/dL (ref 0.82–1.77)

## 2016-09-05 LAB — LIPID PANEL WITH LDL/HDL RATIO
Cholesterol, Total: 184 mg/dL (ref 100–199)
HDL: 55 mg/dL (ref >39)
LDL Calculated: 112 mg/dL — ABNORMAL HIGH (ref 0–99)
LDl/HDL Ratio: 2 ratio (ref 0.0–3.6)
Triglycerides: 84 mg/dL (ref 0–149)
VLDL Cholesterol Cal: 17 mg/dL (ref 5–40)

## 2016-09-05 LAB — CBC WITH DIFFERENTIAL/PLATELET
Basophils Absolute: 0 10*3/uL (ref 0.0–0.2)
Basos: 0 %
EOS (ABSOLUTE): 0 10*3/uL (ref 0.0–0.4)
Eos: 1 %
Hematocrit: 44.1 % (ref 37.5–51.0)
Hemoglobin: 14.9 g/dL (ref 13.0–17.7)
Immature Grans (Abs): 0 10*3/uL (ref 0.0–0.1)
Immature Granulocytes: 0 %
Lymphocytes Absolute: 1.6 10*3/uL (ref 0.7–3.1)
Lymphs: 29 %
MCH: 29.9 pg (ref 26.6–33.0)
MCHC: 33.8 g/dL (ref 31.5–35.7)
MCV: 89 fL (ref 79–97)
Monocytes Absolute: 0.5 10*3/uL (ref 0.1–0.9)
Monocytes: 9 %
Neutrophils Absolute: 3.5 10*3/uL (ref 1.4–7.0)
Neutrophils: 61 %
Platelets: 192 10*3/uL (ref 150–379)
RBC: 4.98 x10E6/uL (ref 4.14–5.80)
RDW: 14.6 % (ref 12.3–15.4)
WBC: 5.7 10*3/uL (ref 3.4–10.8)

## 2016-09-05 LAB — COMPREHENSIVE METABOLIC PANEL
ALT: 22 IU/L (ref 0–44)
AST: 18 IU/L (ref 0–40)
Albumin/Globulin Ratio: 1.5 (ref 1.2–2.2)
Albumin: 4.1 g/dL (ref 3.6–4.8)
Alkaline Phosphatase: 67 IU/L (ref 39–117)
BUN/Creatinine Ratio: 22 (ref 10–24)
BUN: 17 mg/dL (ref 8–27)
Bilirubin Total: 0.5 mg/dL (ref 0.0–1.2)
CO2: 28 mmol/L (ref 18–29)
Calcium: 9.3 mg/dL (ref 8.6–10.2)
Chloride: 95 mmol/L — ABNORMAL LOW (ref 96–106)
Creatinine, Ser: 0.76 mg/dL (ref 0.76–1.27)
GFR calc Af Amer: 113 mL/min/{1.73_m2} (ref >59)
GFR calc non Af Amer: 98 mL/min/{1.73_m2} (ref >59)
Globulin, Total: 2.7 g/dL (ref 1.5–4.5)
Glucose: 90 mg/dL (ref 65–99)
Potassium: 4.1 mmol/L (ref 3.5–5.2)
Sodium: 140 mmol/L (ref 134–144)
Total Protein: 6.8 g/dL (ref 6.0–8.5)

## 2016-09-05 LAB — VITAMIN B12: Vitamin B-12: 464 pg/mL (ref 232–1245)

## 2016-09-05 LAB — VITAMIN D 25 HYDROXY (VIT D DEFICIENCY, FRACTURES): Vit D, 25-Hydroxy: 28.3 ng/mL — ABNORMAL LOW (ref 30.0–100.0)

## 2016-09-05 LAB — INSULIN, RANDOM: INSULIN: 17.2 u[IU]/mL (ref 2.6–24.9)

## 2016-09-05 LAB — FOLATE: Folate: 17.3 ng/mL (ref >3.0)

## 2016-09-05 LAB — T3: T3, Total: 122 ng/dL (ref 71–180)

## 2016-09-06 ENCOUNTER — Encounter: Payer: Self-pay | Admitting: Physical Therapy

## 2016-09-06 ENCOUNTER — Ambulatory Visit: Payer: BLUE CROSS/BLUE SHIELD | Attending: Adult Health | Admitting: Physical Therapy

## 2016-09-06 DIAGNOSIS — M25652 Stiffness of left hip, not elsewhere classified: Secondary | ICD-10-CM | POA: Insufficient documentation

## 2016-09-06 DIAGNOSIS — R2689 Other abnormalities of gait and mobility: Secondary | ICD-10-CM

## 2016-09-06 DIAGNOSIS — M6281 Muscle weakness (generalized): Secondary | ICD-10-CM | POA: Diagnosis present

## 2016-09-06 DIAGNOSIS — M25552 Pain in left hip: Secondary | ICD-10-CM | POA: Insufficient documentation

## 2016-09-06 NOTE — Therapy (Signed)
[facility] [address], [place], [place], [address] Phone: [phone]   Fax:  [phone]  Physical Therapy Treatment  Patient Details  Name: James [name] MRN: [id number] Date of Birth: [birth year] Referring Provider: Dorothyann Peng, PA  Encounter Date: 09/06/2016      PT End of Session - 09/06/16 1247    Visit Number 15   Date for PT Re-Evaluation 09/12/16   PT Start Time 1230   PT Stop Time 1310   PT Time Calculation (min) 40 min   Activity Tolerance Patient limited by pain   Behavior During Therapy [facility] for tasks assessed/performed      Past Medical History:  Diagnosis Date  . Arthritis   . Chicken pox   . Hypertension   . Joint pain   . Obesity   . Osteoarthritis   . Prediabetes   . Swelling    feet and legs    Past Surgical History:  Procedure Laterality Date  . cartilage removal    . KNEE SURGERY  2000    There were no vitals filed for this visit.      Subjective Assessment - 09/06/16 1238    Subjective Patient reports hip doing pretty good today. Continues to have soreness but not as bad today.    Pertinent History Pt has wife with disability and he cares for her, Lt hip OA-significant   Limitations Standing;Walking   How long can you stand comfortably? Rt>Lt weightbearing 25 minutes   How long can you walk comfortably? using cane or walker, antalgic: limited to <5 minutes   Diagnostic tests Lt hip x-ray: significant OA- 3 years ago   Patient Stated Goals reduce hip pain, improve LE strength, lose weight   Currently in Pain? Yes   Pain Score 2    Pain Location Hip   Pain Orientation Left   Pain Descriptors / Indicators Aching;Sore   Pain Type Chronic pain                         [facility] Adult PT Treatment/Exercise - 09/06/16 0001      Knee/Hip Exercises: Stretches   Active Hamstring Stretch Left;3 reps;20 seconds  on floor seated   Quad Stretch Both;2 reps;10  seconds   Gastroc Stretch Both;2 reps;10 seconds  Slant board     Knee/Hip Exercises: Aerobic   Nustep Level 1 x 7 minutes-slow mobility  therapist present to discuss treatment     Knee/Hip Exercises: Standing   Hip Abduction AROM;Right;Left;10 reps   Hip Extension AROM;Right;Left;10 reps   Other Standing Knee Exercises Hip swings 5# 25x      Knee/Hip Exercises: Seated   Long Arc Quad Strengthening;Right;Left;10 reps   Long Arc Quad Limitations red band    Ball Squeeze 20x   Clamshell with TheraBand Green  3x10   Knee/Hip Flexion green band ankle dorsiflexion with hip flexion 10x right/left      Cryotherapy   Cryotherapy Location Lumbar Spine   Type of Cryotherapy Ice pack                  PT Short Term Goals - 09/06/16 1247      PT SHORT TERM GOAL #3   Title initiate cardio exercise in the pool with walking > or = to 2x/wk   Status Achieved     PT SHORT TERM GOAL #4   Title report a 25% reduction in Lt hip pain with standing  and walking   Baseline 10%   Time 4   Period Weeks   Status On-going           PT Long Term Goals - 09/06/16 1247      PT LONG TERM GOAL #1   Title be independent in advanced HEP   Time 8   Period Weeks   Status On-going     PT LONG TERM GOAL #5   Title report compliance with regular strength and cardio exercise for weight loss   Time 8   Period Weeks   Status On-going               Plan - 09/06/16 1305    Clinical Impression Statement Patient continues to have decreased hip strength and mobility but getting better. Patient reports having less pain today and able to tolerate all exercises well. Patient will continue to benefit from skilled thearpy for LE strength, stability, and endurance.    Rehab Potential Good   PT Frequency 2x / week   PT Duration 8 weeks   PT Next Visit Plan Hip mobility, weight shifting on rebounder;  hip distraction techniques manually or with ex   Consulted and Agree with Plan of Care  Patient      Patient will benefit from skilled therapeutic intervention in order to improve the following deficits and impairments:  Decreased strength, Abnormal gait, Pain, Impaired flexibility, Decreased activity tolerance, Decreased endurance, Difficulty walking, Decreased range of motion  Visit Diagnosis: Pain in left hip  Other abnormalities of gait and mobility  Stiffness of left hip, not elsewhere classified  Muscle weakness (generalized)     Problem List Patient Active Problem List   Diagnosis Date Noted  . Hyperglycemia 09/04/2016  . Severe obesity (BMI >= 40) ([place]) 01/15/2013  . OBESITY, MORBID 01/20/2007  . Essential hypertension 10/31/2006    Mikle Bosworth PTA 09/06/2016, 1:09 PM  [facility] [address] [place], [place], [address] Phone: [phone]   Fax:  [phone]  Name: SIDNEY KANN MRN: [id number] Date of Birth: February 25, 1956

## 2016-09-10 ENCOUNTER — Ambulatory Visit: Payer: BLUE CROSS/BLUE SHIELD

## 2016-09-10 DIAGNOSIS — M6281 Muscle weakness (generalized): Secondary | ICD-10-CM

## 2016-09-10 DIAGNOSIS — R2689 Other abnormalities of gait and mobility: Secondary | ICD-10-CM

## 2016-09-10 DIAGNOSIS — M25652 Stiffness of left hip, not elsewhere classified: Secondary | ICD-10-CM

## 2016-09-10 DIAGNOSIS — M25552 Pain in left hip: Secondary | ICD-10-CM

## 2016-09-10 NOTE — Therapy (Signed)
[facility] [address], [place], [place], [address] Phone: [phone]   Fax:  [phone]  Physical Therapy Treatment  Patient Details  Name: James [name] MRN: [id number] Date of Birth: Jan 20, 1956 Referring Provider: Dorothyann Peng, PA  Encounter Date: 09/10/2016      PT End of Session - 09/10/16 1308    Visit Number 16   Date for PT Re-Evaluation 10/24/16   PT Start Time 1228   PT Stop Time 1314   PT Time Calculation (min) 46 min   Activity Tolerance Patient tolerated treatment well   Behavior During Therapy [facility] for tasks assessed/performed      Past Medical History:  Diagnosis Date  . Arthritis   . Chicken pox   . Hypertension   . Joint pain   . Obesity   . Osteoarthritis   . Prediabetes   . Swelling    feet and legs    Past Surgical History:  Procedure Laterality Date  . cartilage removal    . KNEE SURGERY  2000    There were no vitals filed for this visit.      Subjective Assessment - 09/10/16 1228    Subjective Pt feels 15% overall improvment since the start of care.  I am more flexible and able to do a little bit more.     Pertinent History Pt has wife with disability and he cares for her, Lt hip OA-significant   Diagnostic tests Lt hip x-ray: significant OA- 3 years ago   Patient Stated Goals reduce hip pain, improve LE strength, lose weight   Currently in Pain? Yes   Pain Score 2    Pain Location Hip   Pain Orientation Left   Pain Descriptors / Indicators Aching;Sore   Pain Type Chronic pain   Pain Onset More than a month ago   Pain Frequency Constant   Aggravating Factors  standing, increased activity   Pain Relieving Factors medication, stretching, rest            [facility] PT Assessment - 09/10/16 0001      Assessment   Medical Diagnosis Lt hip pain     Cognition   Overall Cognitive Status Within Functional Limits for tasks assessed     Observation/Other Assessments    Focus on Therapeutic Outcomes (FOTO)  66% limitation                     [facility] Adult PT Treatment/Exercise - 09/10/16 0001      Knee/Hip Exercises: Stretches   Active Hamstring Stretch Left;3 reps;20 seconds  on floor seated   Quad Stretch Both;2 reps;10 seconds   Gastroc Stretch Both;2 reps;10 seconds  Slant board     Knee/Hip Exercises: Aerobic   Nustep Level 1 x 7 minutes-slow mobility  therapist present to discuss treatment     Knee/Hip Exercises: Standing   Hip Abduction AROM;Right;Left;10 reps   Hip Extension AROM;Right;Left;10 reps     Knee/Hip Exercises: Seated   Long Arc Quad Strengthening;Right;Left;10 reps   Long Arc Quad Limitations red band    Ball Squeeze 20x   Clamshell with TheraBand Green  3x10     Manual Therapy   Passive ROM Manual stretching to hip/ knee                   PT Short Term Goals - 09/10/16 1234      PT SHORT TERM GOAL #3   Title initiate cardio exercise  in the pool with walking > or = to 2x/wk   Status Achieved     PT SHORT TERM GOAL #4   Title report a 25% reduction in Lt hip pain with standing and walking   Baseline 15% improvement   Time 4   Period Weeks   Status On-going           PT Long Term Goals - 09/10/16 1235      PT LONG TERM GOAL #1   Title be independent in advanced HEP   Time 6   Period Weeks   Status On-going     PT LONG TERM GOAL #2   Title reduce FOTO to < or = to 55% limitation   Baseline 66% limitation   Time 6   Period Weeks   Status On-going     PT LONG TERM GOAL #3   Title report a 30% reduction in Lt hip pain with standing and walking   Baseline 15% reduction   Time 6   Period Weeks   Status On-going     PT LONG TERM GOAL #4   Title improve LE strength to perform sit to stand with minimal to no UE support   Time 6   Period Weeks   Status On-going     PT LONG TERM GOAL #5   Title report compliance with regular strength and cardio exercise for weight loss    Baseline 1x/wk    Time 6   Period Weeks   Status On-going               Plan - 09/10/16 1238    Clinical Impression Statement Pt reports 15% overall improvement in hip pain and immobility since the start of care.  Pt has initiated a pool exercise program 1x/wk for cardio and weight loss.  Pt requires UE support with sit to stand transition due to obesity.  Pt with slow progress toward goals due to chronic nature of condition.  Pt will continue to benefit from skilled PT for hip strength, flexiblity and advancement of mobility.     Rehab Potential Good   PT Frequency 2x / week   PT Duration 8 weeks   PT Treatment/Interventions ADLs/Self Care Home Management;Cryotherapy;Electrical Stimulation;Iontophoresis 4mg /ml Dexamethasone;Functional mobility training;Stair training;Gait training;Ultrasound;Moist Heat;Therapeutic activities;Therapeutic exercise;Neuromuscular re-education;Patient/family education;Passive range of motion;Manual techniques;Dry needling;Taping   PT Next Visit Plan Hip mobility, weight shifting on rebounder;  hip distraction techniques manually or with ex   Consulted and Agree with Plan of Care Patient      Patient will benefit from skilled therapeutic intervention in order to improve the following deficits and impairments:  Decreased strength, Abnormal gait, Pain, Impaired flexibility, Decreased activity tolerance, Decreased endurance, Difficulty walking, Decreased range of motion  Visit Diagnosis: Pain in left hip - Plan: PT plan of care cert/re-cert  Other abnormalities of gait and mobility - Plan: PT plan of care cert/re-cert  Stiffness of left hip, not elsewhere classified - Plan: PT plan of care cert/re-cert  Muscle weakness (generalized) - Plan: PT plan of care cert/re-cert     Problem List Patient Active Problem List   Diagnosis Date Noted  . Hyperglycemia 09/04/2016  . Severe obesity (BMI >= 40) ([place]) 01/15/2013  . OBESITY, MORBID 01/20/2007  .  Essential hypertension 10/31/2006    Sigurd Sos, PT 09/10/16 1:10 PM  [facility] [address] [place], [place], [address] Phone: [phone]   Fax:  [phone]  Name: Johnattan Strassman  [place] MRN: [id number] Date of Birth: 1955/07/22

## 2016-09-12 ENCOUNTER — Other Ambulatory Visit: Payer: BLUE CROSS/BLUE SHIELD

## 2016-09-12 ENCOUNTER — Encounter: Payer: Self-pay | Admitting: Adult Health

## 2016-09-13 ENCOUNTER — Ambulatory Visit: Payer: BLUE CROSS/BLUE SHIELD

## 2016-09-13 DIAGNOSIS — M25552 Pain in left hip: Secondary | ICD-10-CM | POA: Diagnosis not present

## 2016-09-13 DIAGNOSIS — M25652 Stiffness of left hip, not elsewhere classified: Secondary | ICD-10-CM

## 2016-09-13 DIAGNOSIS — M6281 Muscle weakness (generalized): Secondary | ICD-10-CM

## 2016-09-13 DIAGNOSIS — R2689 Other abnormalities of gait and mobility: Secondary | ICD-10-CM

## 2016-09-13 NOTE — Therapy (Signed)
[facility] [address] [place], [place], [address] Phone: [phone]   Fax:  [phone]  Physical Therapy Treatment  Patient Details  Name: James [name] MRN: [id number] Date of Birth: 01/25/1956 Referring Provider: Dorothyann Peng, PA  Encounter Date: 09/13/2016      PT End of Session - 09/13/16 1220    Visit Number 17   Date for PT Re-Evaluation 10/24/16   PT Start Time 2778   PT Stop Time 1219   PT Time Calculation (min) 43 min   Activity Tolerance Patient tolerated treatment well   Behavior During Therapy [facility] for tasks assessed/performed      Past Medical History:  Diagnosis Date  . Arthritis   . Chicken pox   . Hypertension   . Joint pain   . Obesity   . Osteoarthritis   . Prediabetes   . Swelling    feet and legs    Past Surgical History:  Procedure Laterality Date  . cartilage removal    . KNEE SURGERY  2000    There were no vitals filed for this visit.      Subjective Assessment - 09/13/16 1141    Subjective Doing well today.     Patient Stated Goals reduce hip pain, improve LE strength, lose weight                         [facility] Adult PT Treatment/Exercise - 09/13/16 0001      Knee/Hip Exercises: Stretches   Active Hamstring Stretch Left;3 reps;20 seconds  on floor seated   Quad Stretch Both;2 reps;10 seconds   Gastroc Stretch Both;2 reps;10 seconds  Slant board     Knee/Hip Exercises: Aerobic   Nustep Level 1 x 8 minutes-slow mobility  therapist present to discuss treatment     Knee/Hip Exercises: Standing   Hip Abduction AROM;Right;Left;10 reps;2 sets   Hip Extension AROM;Right;Left;10 reps;2 sets     Knee/Hip Exercises: Seated   Long Arc Quad Strengthening;Right;Left;10 reps   Long Arc Quad Weight 2 lbs.   Ball Squeeze 20x   Clamshell with TheraBand Green  3x10     Manual Therapy   Passive ROM Manual stretching to Lt hip/ knee- supine and  prone. Orange roller to [employer] for tissue mobilization                  PT Short Term Goals - 09/10/16 1234      PT SHORT TERM GOAL #3   Title initiate cardio exercise in the pool with walking > or = to 2x/wk   Status Achieved     PT SHORT TERM GOAL #4   Title report a 25% reduction in Lt hip pain with standing and walking   Baseline 15% improvement   Time 4   Period Weeks   Status On-going           PT Long Term Goals - 09/10/16 1235      PT LONG TERM GOAL #1   Title be independent in advanced HEP   Time 6   Period Weeks   Status On-going     PT LONG TERM GOAL #2   Title reduce FOTO to < or = to 55% limitation   Baseline 66% limitation   Time 6   Period Weeks   Status On-going     PT LONG TERM GOAL #3   Title report a 30% reduction in Lt hip pain  with standing and walking   Baseline 15% reduction   Time 6   Period Weeks   Status On-going     PT LONG TERM GOAL #4   Title improve LE strength to perform sit to stand with minimal to no UE support   Time 6   Period Weeks   Status On-going     PT LONG TERM GOAL #5   Title report compliance with regular strength and cardio exercise for weight loss   Baseline 1x/wk    Time 6   Period Weeks   Status On-going               Plan - 09/13/16 1146    Clinical Impression Statement Pt reports 15% overall improvement in Lt hip pain and mobility since the start of care.  Pt has initiated a pool exercise program 1x/wk for cardio and weight loss.  Pt requires UE support with sit to stand transition due to obesity.  Pt will continue to benefit from skilled PT for hip strnegth, flexibility and advancment of mobility.     Rehab Potential Good   PT Frequency 2x / week   PT Duration 8 weeks   PT Treatment/Interventions ADLs/Self Care Home Management;Cryotherapy;Electrical Stimulation;Iontophoresis 4mg /ml Dexamethasone;Functional mobility training;Stair training;Gait training;Ultrasound;Moist Heat;Therapeutic  activities;Therapeutic exercise;Neuromuscular re-education;Patient/family education;Passive range of motion;Manual techniques;Dry needling;Taping   PT Next Visit Plan Hip mobility, strength, weight shifting   Consulted and Agree with Plan of Care Patient      Patient will benefit from skilled therapeutic intervention in order to improve the following deficits and impairments:  Decreased strength, Abnormal gait, Pain, Impaired flexibility, Decreased activity tolerance, Decreased endurance, Difficulty walking, Decreased range of motion  Visit Diagnosis: Pain in left hip  Other abnormalities of gait and mobility  Stiffness of left hip, not elsewhere classified  Muscle weakness (generalized)     Problem List Patient Active Problem List   Diagnosis Date Noted  . Hyperglycemia 09/04/2016  . Severe obesity (BMI >= 40) ([place]) 01/15/2013  . OBESITY, MORBID 01/20/2007  . Essential hypertension 10/31/2006     Sigurd Sos, PT 09/13/16 12:24 PM  [facility] [address], [place], [place], [address] Phone: [phone]   Fax:  [phone]  Name: James [name] MRN: [id number] Date of Birth: [birth year]

## 2016-09-17 ENCOUNTER — Ambulatory Visit (INDEPENDENT_AMBULATORY_CARE_PROVIDER_SITE_OTHER): Payer: BLUE CROSS/BLUE SHIELD | Admitting: Family Medicine

## 2016-09-17 VITALS — BP 112/79 | HR 88 | Temp 97.9°F | Ht 65.0 in | Wt 299.0 lb

## 2016-09-17 DIAGNOSIS — Z9189 Other specified personal risk factors, not elsewhere classified: Secondary | ICD-10-CM

## 2016-09-17 DIAGNOSIS — E559 Vitamin D deficiency, unspecified: Secondary | ICD-10-CM | POA: Diagnosis not present

## 2016-09-17 DIAGNOSIS — R7303 Prediabetes: Secondary | ICD-10-CM | POA: Diagnosis not present

## 2016-09-17 MED ORDER — VITAMIN D (ERGOCALCIFEROL) 1.25 MG (50000 UNIT) PO CAPS
50000.0000 [IU] | ORAL_CAPSULE | ORAL | 0 refills | Status: DC
Start: 2016-09-17 — End: 2016-12-04

## 2016-09-17 MED ORDER — METFORMIN HCL 500 MG PO TABS: 500.0000 mg | ORAL_TABLET | Freq: Every day | ORAL | 0 refills | Status: DC

## 2016-09-18 ENCOUNTER — Encounter: Payer: BLUE CROSS/BLUE SHIELD | Admitting: Adult Health

## 2016-09-18 ENCOUNTER — Ambulatory Visit: Payer: BLUE CROSS/BLUE SHIELD | Admitting: Physical Therapy

## 2016-09-18 ENCOUNTER — Encounter: Payer: Self-pay | Admitting: Physical Therapy

## 2016-09-18 DIAGNOSIS — M25652 Stiffness of left hip, not elsewhere classified: Secondary | ICD-10-CM

## 2016-09-18 DIAGNOSIS — M6281 Muscle weakness (generalized): Secondary | ICD-10-CM

## 2016-09-18 DIAGNOSIS — R2689 Other abnormalities of gait and mobility: Secondary | ICD-10-CM

## 2016-09-18 DIAGNOSIS — M25552 Pain in left hip: Secondary | ICD-10-CM

## 2016-09-18 NOTE — Therapy (Signed)
[facility] [address] [place], [place], [address] Phone: [phone]   Fax:  [phone]  Physical Therapy Treatment  Patient Details  Name: James [name] MRN: [id number] Date of Birth: 1955/09/04 Referring Provider: Dorothyann Peng, PA  Encounter Date: 09/18/2016      PT End of Session - 09/18/16 1105    Visit Number 18   Date for PT Re-Evaluation 10/24/16   PT Start Time 1053   PT Stop Time 1142   PT Time Calculation (min) 49 min   Activity Tolerance Patient tolerated treatment well   Behavior During Therapy [facility] for tasks assessed/performed      Past Medical History:  Diagnosis Date  . Arthritis   . Chicken pox   . Hypertension   . Joint pain   . Obesity   . Osteoarthritis   . Prediabetes   . Swelling    feet and legs    Past Surgical History:  Procedure Laterality Date  . cartilage removal    . KNEE SURGERY  2000    There were no vitals filed for this visit.      Subjective Assessment - 09/18/16 1101    Subjective Hip feeling ok today. I was able to make it to the pool yesterday. Patient reports hip is not really bothering me except in certain positions, It's probably about a 2 but then with certain positions it gets to a 5.    Pertinent History Pt has wife with disability and he cares for her, Lt hip OA-significant   Limitations Standing;Walking   How long can you stand comfortably? Rt>Lt weightbearing 25 minutes   How long can you walk comfortably? using cane or walker, antalgic: limited to <5 minutes   Diagnostic tests Lt hip x-ray: significant OA- 3 years ago   Patient Stated Goals reduce hip pain, improve LE strength, lose weight   Currently in Pain? Yes   Pain Score 2    Pain Location Hip   Pain Orientation Left   Pain Descriptors / Indicators Aching;Sore   Pain Type Chronic pain   Pain Radiating Towards Rt calf   Pain Onset More than a month ago   Pain Frequency Constant                          [facility] Adult PT Treatment/Exercise - 09/18/16 0001      Knee/Hip Exercises: Stretches   Active Hamstring Stretch Left;3 reps;20 seconds  on floor seated   Quad Stretch Both;2 reps;10 seconds   Gastroc Stretch Both;2 reps;10 seconds  Slant board     Knee/Hip Exercises: Aerobic   Nustep Level 1 x 8 minutes-slow mobility  therapist present to discuss treatment     Knee/Hip Exercises: Standing   Hip Abduction AROM;Right;Left;10 reps;2 sets   Hip Extension AROM;Right;Left;10 reps;2 sets     Knee/Hip Exercises: Seated   Long Arc Quad Strengthening;Right;Left;10 reps   Long Arc Quad Weight 3 lbs.   Ball Squeeze 20x   Clamshell with TheraBand Green  3x10                  PT Short Term Goals - 09/18/16 1137      PT SHORT TERM GOAL #4   Title report a 25% reduction in Lt hip pain with standing and walking   Baseline 15% improvement   Time 4   Period Weeks   Status On-going  PT Long Term Goals - 09/18/16 1137      PT LONG TERM GOAL #1   Title be independent in advanced HEP   Time 6   Period Weeks   Status On-going     PT LONG TERM GOAL #2   Title reduce FOTO to < or = to 55% limitation   Baseline 66% limitation   Time 6   Period Weeks   Status On-going     PT LONG TERM GOAL #3   Title report a 30% reduction in Lt hip pain with standing and walking   Baseline 15% reduction   Time 6   Period Weeks   Status On-going               Plan - 09/18/16 1139    Clinical Impression Statement Patient reports hip doing ok today. Able to tolerate all exercises well but continues to be challenged by current level of resistance and repitions. Patient will continue to benefit from skilled therapy for LE strengthening and hip stability.    Rehab Potential Good   PT Frequency 2x / week   PT Duration 8 weeks   PT Treatment/Interventions ADLs/Self Care Home Management;Cryotherapy;Electrical Stimulation;Iontophoresis  4mg /ml Dexamethasone;Functional mobility training;Stair training;Gait training;Ultrasound;Moist Heat;Therapeutic activities;Therapeutic exercise;Neuromuscular re-education;Patient/family education;Passive range of motion;Manual techniques;Dry needling;Taping   Consulted and Agree with Plan of Care Patient      Patient will benefit from skilled therapeutic intervention in order to improve the following deficits and impairments:  Decreased strength, Abnormal gait, Pain, Impaired flexibility, Decreased activity tolerance, Decreased endurance, Difficulty walking, Decreased range of motion  Visit Diagnosis: Pain in left hip  Other abnormalities of gait and mobility  Muscle weakness (generalized)  Stiffness of left hip, not elsewhere classified     Problem List Patient Active Problem List   Diagnosis Date Noted  . Hyperglycemia 09/04/2016  . Severe obesity (BMI >= 40) ([place]) 01/15/2013  . OBESITY, MORBID 01/20/2007  . Essential hypertension 10/31/2006    Mikle Bosworth PTA 09/18/2016, 11:47 AM  [facility] [address], [place], [place], [address] Phone: [phone]   Fax:  [phone]  Name: James [name] MRN: [id number] Date of Birth: 1955/07/17

## 2016-09-18 NOTE — Progress Notes (Signed)
Office: [phone]  /  Fax: [phone]   HPI:   Chief Complaint: OBESITY James [name] is here to discuss his progress with his obesity treatment plan. He is on the  follow the Category 2 plan and is following his eating plan approximately 95 % of the time. He states he is exercising in pool for 25 minutes, PT for 40 minutes 3 times per week. James [name] did well with weight loss but struggled to weigh and measure food and sometimes ate out. Hunger was mostly controlled and he struggled with nighttime cravings. His weight is 299 lb (135.6 kg) today and has had a weight loss of 6 pounds over a period of 2 weeks since his last visit. He has lost 6 lbs since starting treatment with James [name].  Vitamin D deficiency Kaceton has a new diagnosis of vitamin D deficiency. He is not currently taking vit D. Shawn admits fatigue and denies nausea, vomiting or muscle weakness.  Pre-Diabetes James [name] has a diagnosis of pre-diabetes based on his elevated Hgb A1c and was informed this puts him at greater risk of developing diabetes. He  Was not on metformin previously and is attempting to control with diet and exercise to decrease risk of diabetes. He admits polyphagia and denies nausea or hypoglycemia.  At risk for diabetes James [name] is at higher than average risk for developing diabetes due to his obesity and pre-diabetes. He currently denies polyuria or polydipsia.   ALLERGIES: No Known Allergies  MEDICATIONS: Current Outpatient Prescriptions on File Prior to Visit  Medication Sig Dispense Refill  . Acetaminophen (TYLENOL PO) Take by mouth.    . cyclobenzaprine (FLEXERIL) 10 MG tablet Take 1 tablet (10 mg total) by mouth 3 (three) times daily as needed for muscle spasms. 30 tablet 0  . hydrochlorothiazide (HYDRODIURIL) 25 MG tablet TAKE 1 TABLET (25 MG TOTAL) BY MOUTH DAILY. 90 tablet 0  . ibuprofen (ADVIL,MOTRIN) 200 MG tablet Take 200 mg by mouth every 6 (six) hours as needed.    Marland Kitchen lisinopril (PRINIVIL,ZESTRIL) 5 MG  tablet TAKE 1 TABLET (5 MG TOTAL) BY MOUTH DAILY. 90 tablet 1  . meloxicam (MOBIC) 15 MG tablet Take 1 tablet (15 mg total) by mouth daily as needed for pain. 90 tablet 0  . Multiple Vitamins-Minerals (MULTIVITAMIN WITH MINERALS) tablet Take 1 tablet by mouth daily.     No current facility-administered medications on file prior to visit.     PAST MEDICAL HISTORY: Past Medical History:  Diagnosis Date  . Arthritis   . Chicken pox   . Hypertension   . Joint pain   . Obesity   . Osteoarthritis   . Prediabetes   . Swelling    feet and legs    PAST SURGICAL HISTORY: Past Surgical History:  Procedure Laterality Date  . cartilage removal    . KNEE SURGERY  2000    SOCIAL HISTORY: Social History  Substance Use Topics  . Smoking status: Former Smoker    Quit date: 07/16/1992  . Smokeless tobacco: Never Used  . Alcohol use Yes     Comment: very little per patient     FAMILY HISTORY: Family History  Problem Relation Age of Onset  . Cancer Mother        lung and soft tissue cancer from radiation  . Stroke Mother   . Obesity Mother   . Heart disease Father        CHF  . Diabetes Father   . Hypertension Father   . Obesity Father   .  Heart disease Maternal Grandfather   . Diabetes Paternal Grandmother   . Ovarian cancer Sister     ROS: Review of Systems  Constitutional: Positive for malaise/fatigue and weight loss.  Gastrointestinal: Negative for nausea and vomiting.  Genitourinary: Positive for frequency.  Musculoskeletal:       Negative muscle weakness  Endo/Heme/Allergies: Positive for polydipsia.       Polyphagia Negative hypoglycemia    PHYSICAL EXAM: Blood pressure 112/79, pulse 88, temperature 97.9 F (36.6 C), temperature source Oral, height 5\' 5"  (1.651 m), weight 299 lb (135.6 kg), SpO2 97 %. Body mass index is 49.76 kg/m. Physical Exam  Constitutional: He is oriented to person, place, and time. He appears well-developed and well-nourished.    Cardiovascular: Normal rate.   Pulmonary/Chest: Effort normal.  Musculoskeletal: Normal range of motion.  Neurological: He is oriented to person, place, and time.  Skin: Skin is warm and dry.  Psychiatric: He has a normal mood and affect. His behavior is normal.  Vitals reviewed.   RECENT LABS AND TESTS: BMET    Component Value Date/Time   NA 140 09/04/2016 1141   K 4.1 09/04/2016 1141   CL 95 (L) 09/04/2016 1141   CO2 28 09/04/2016 1141   GLUCOSE 90 09/04/2016 1141   GLUCOSE 108 (H) 09/13/2015 0910   BUN 17 09/04/2016 1141   CREATININE 0.76 09/04/2016 1141   CALCIUM 9.3 09/04/2016 1141   GFRNONAA 98 09/04/2016 1141   GFRAA 113 09/04/2016 1141   Lab Results  Component Value Date   HGBA1C 5.7 (H) 09/04/2016   HGBA1C 5.9 09/13/2015   HGBA1C 5.8 07/17/2012   Lab Results  Component Value Date   INSULIN 17.2 09/04/2016   CBC    Component Value Date/Time   WBC 5.7 09/04/2016 1141   WBC 6.4 09/13/2015 0910   RBC 4.98 09/04/2016 1141   RBC 4.96 09/13/2015 0910   HGB 14.8 09/13/2015 0910   HCT 44.1 09/04/2016 1141   PLT 192 09/04/2016 1141   MCV 89 09/04/2016 1141   MCH 29.9 09/04/2016 1141   MCHC 33.8 09/04/2016 1141   MCHC 33.6 09/13/2015 0910   RDW 14.6 09/04/2016 1141   LYMPHSABS 1.6 09/04/2016 1141   MONOABS 0.5 09/13/2015 0910   EOSABS 0.0 09/04/2016 1141   BASOSABS 0.0 09/04/2016 1141   Iron/TIBC/Ferritin/ %Sat No results found for: IRON, TIBC, FERRITIN, IRONPCTSAT Lipid Panel     Component Value Date/Time   CHOL 184 09/04/2016 1141   TRIG 84 09/04/2016 1141   HDL 55 09/04/2016 1141   CHOLHDL 3 09/13/2015 0910   VLDL 14.2 09/13/2015 0910   LDLCALC 112 (H) 09/04/2016 1141   Hepatic Function Panel     Component Value Date/Time   PROT 6.8 09/04/2016 1141   ALBUMIN 4.1 09/04/2016 1141   AST 18 09/04/2016 1141   ALT 22 09/04/2016 1141   ALKPHOS 67 09/04/2016 1141   BILITOT 0.5 09/04/2016 1141   BILIDIR 0.1 11/14/2006 0928      Component Value  Date/Time   TSH 1.830 09/04/2016 1141   TSH 1.42 11/30/2013 1318   TSH 1.95 11/14/2006 0928    ASSESSMENT AND PLAN: Vitamin D deficiency - Plan: Vitamin D, Ergocalciferol, (DRISDOL) 50000 units CAPS capsule  Prediabetes - Plan: metFORMIN (GLUCOPHAGE) 500 MG tablet  At risk for diabetes mellitus - Plan: metFORMIN (GLUCOPHAGE) 500 MG tablet  Morbid obesity ([place])  PLAN:  Vitamin D Deficiency Divante was informed that low vitamin D levels contributes to fatigue and are associated  with obesity, breast, and colon cancer. He agrees to start to take prescription Vit D @50 ,000 IU every week#4 with no refills and will follow up for routine testing of vitamin D, at least 2-3 times per year. He was informed of the risk of over-replacement of vitamin D and agrees to not increase his dose unless he discusses this with James [name] first. Primo agrees to follow up with our clinic in 2 weeks.  Pre-Diabetes Eryk will continue to work on weight loss, exercise, and decreasing simple carbohydrates in his diet to help decrease the risk of diabetes. We dicussed metformin including benefits and risks. He was informed that eating too many simple carbohydrates or too many calories at one sitting increases the likelihood of GI side effects. Viliami requested metformin for now and a prescription was written today for metformin 500 mg every morning #30 with no refills. [place] agreed to follow up with James [name] as directed to monitor his progress.  Diabetes risk counselling Trigger was given extended (at least 30 minutes) diabetes prevention counseling today. He is 61 y.o. male and has risk factors for diabetes including obesity and pre-diabetes. We discussed intensive lifestyle modifications today with an emphasis on weight loss as well as increasing exercise and decreasing simple carbohydrates in his diet.  Obesity Jerry is currently in the action stage of change. As such, his goal is to continue with weight loss efforts He has agreed  to follow the Category 3 plan [place] has been instructed to work up to a goal of 150 minutes of combined cardio and strengthening exercise per week for weight loss and overall health benefits. We discussed the following Behavioral Modification Stratagies today: decreasing simple carbohydrates  and work on meal planning and easy cooking plans  Halden has agreed to follow up with our clinic in 2 weeks. He was informed of the importance of frequent follow up visits to maximize his success with intensive lifestyle modifications for his multiple health conditions.  I, Doreene Nest, am acting as scribe for Dennard Nip, MD  I have reviewed the above documentation for accuracy and completeness, and I agree with the above. -Dennard Nip, MD

## 2016-09-20 ENCOUNTER — Ambulatory Visit: Payer: BLUE CROSS/BLUE SHIELD

## 2016-09-20 VITALS — BP 110/75

## 2016-09-20 DIAGNOSIS — R2689 Other abnormalities of gait and mobility: Secondary | ICD-10-CM

## 2016-09-20 DIAGNOSIS — M25552 Pain in left hip: Secondary | ICD-10-CM

## 2016-09-20 DIAGNOSIS — M6281 Muscle weakness (generalized): Secondary | ICD-10-CM

## 2016-09-20 DIAGNOSIS — M25652 Stiffness of left hip, not elsewhere classified: Secondary | ICD-10-CM

## 2016-09-20 NOTE — Therapy (Signed)
[facility] [address], [place], [place], [address] Phone: [phone]   Fax:  [phone]  Physical Therapy Treatment  Patient Details  Name: James [name] MRN: [id number] Date of Birth: [birth year] Referring Provider: Dorothyann Peng, PA  Encounter Date: 09/20/2016      PT End of Session - 09/20/16 1214    Visit Number 19   Date for PT Re-Evaluation 10/24/16   PT Start Time 4193   PT Stop Time 1214   PT Time Calculation (min) 29 min   Activity Tolerance Patient limited by fatigue   Behavior During Therapy [facility] for tasks assessed/performed      Past Medical History:  Diagnosis Date  . Arthritis   . Chicken pox   . Hypertension   . Joint pain   . Obesity   . Osteoarthritis   . Prediabetes   . Swelling    feet and legs    Past Surgical History:  Procedure Laterality Date  . cartilage removal    . KNEE SURGERY  2000    Vitals:   09/20/16 1146  BP: 110/75        Subjective Assessment - 09/20/16 1146    Subjective I started a new medication and it is making me feel a little bit lightheaded.     Patient Stated Goals reduce hip pain, improve LE strength, lose weight   Currently in Pain? Yes   Pain Score 2    Pain Location Hip   Pain Orientation Left   Pain Descriptors / Indicators Aching;Sore                         [facility] Adult PT Treatment/Exercise - 09/20/16 0001      Knee/Hip Exercises: Stretches   Active Hamstring Stretch Left;3 reps;20 seconds  on floor seated   Quad Stretch Both;2 reps;10 seconds   Gastroc Stretch Both;2 reps;10 seconds  Slant board     Knee/Hip Exercises: Aerobic   Nustep Level 1 x 8 minutes-slow mobility  therapist present to discuss treatment     Knee/Hip Exercises: Standing   Hip Abduction AROM;Right;Left;10 reps;1 set   Hip Extension AROM;Right;Left;10 reps;1 set     Knee/Hip Exercises: Seated   Long Arc Quad Strengthening;Right;Left;10  reps   Long Arc Quad Weight 3 lbs.   Ball Squeeze 20x   Clamshell with [employer]  3x10     Manual Therapy   Passive ROM Manual stretching to Lt hip/ knee- supine and prone. Orange roller to [employer] for tissue mobilization                  PT Short Term Goals - 09/18/16 1137      PT SHORT TERM GOAL #4   Title report a 25% reduction in Lt hip pain with standing and walking   Baseline 15% improvement   Time 4   Period Weeks   Status On-going           PT Long Term Goals - 09/18/16 1137      PT LONG TERM GOAL #1   Title be independent in advanced HEP   Time 6   Period Weeks   Status On-going     PT LONG TERM GOAL #2   Title reduce FOTO to < or = to 55% limitation   Baseline 66% limitation   Time 6   Period Weeks   Status On-going     PT LONG  TERM GOAL #3   Title report a 30% reduction in Lt hip pain with standing and walking   Baseline 15% reduction   Time 6   Period Weeks   Status On-going               Plan - 09/20/16 1148    Clinical Impression Statement Pt with steady level of Lt hip pain at 2/10 over the past 2 weeks.  Pt able to tolerate all exercises in the clinic and is challenged by resistance and repetitions. Reduced reps with standing exercise today due to pt request as he is feeling light headed due to new medication. Pt with Lt hip stiffness and difficulty with sit to stand transition.  Pt will continue to benefit from skilled PT for Lt hip flexibility, strength and endurance.     Rehab Potential Good   PT Frequency 2x / week   PT Duration 8 weeks   PT Treatment/Interventions ADLs/Self Care Home Management;Cryotherapy;Electrical Stimulation;Iontophoresis 4mg /ml Dexamethasone;Functional mobility training;Stair training;Gait training;Ultrasound;Moist Heat;Therapeutic activities;Therapeutic exercise;Neuromuscular re-education;Patient/family education;Passive range of motion;Manual techniques;Dry needling;Taping   PT Next Visit Plan  Hip mobility, strength, weight shifting   Consulted and Agree with Plan of Care Patient      Patient will benefit from skilled therapeutic intervention in order to improve the following deficits and impairments:  Decreased strength, Abnormal gait, Pain, Impaired flexibility, Decreased activity tolerance, Decreased endurance, Difficulty walking, Decreased range of motion  Visit Diagnosis: Pain in left hip  Other abnormalities of gait and mobility  Muscle weakness (generalized)  Stiffness of left hip, not elsewhere classified     Problem List Patient Active Problem List   Diagnosis Date Noted  . Hyperglycemia 09/04/2016  . Severe obesity (BMI >= 40) ([place]) 01/15/2013  . OBESITY, MORBID 01/20/2007  . Essential hypertension 10/31/2006     Sigurd Sos, PT 09/20/16 12:16 PM  [facility] [address] [place], [place], [address] Phone: [phone]   Fax:  [phone]  Name: James [name] MRN: [id number] Date of Birth: 30-Aug-1955

## 2016-09-25 ENCOUNTER — Encounter: Payer: Self-pay | Admitting: Physical Therapy

## 2016-09-25 ENCOUNTER — Ambulatory Visit: Payer: BLUE CROSS/BLUE SHIELD | Admitting: Physical Therapy

## 2016-09-25 DIAGNOSIS — R2689 Other abnormalities of gait and mobility: Secondary | ICD-10-CM

## 2016-09-25 DIAGNOSIS — M6281 Muscle weakness (generalized): Secondary | ICD-10-CM

## 2016-09-25 DIAGNOSIS — M25552 Pain in left hip: Secondary | ICD-10-CM

## 2016-09-25 DIAGNOSIS — M25652 Stiffness of left hip, not elsewhere classified: Secondary | ICD-10-CM

## 2016-09-25 NOTE — Therapy (Signed)
[facility] [address], [place], [place], [address] Phone: [phone]   Fax:  [phone]  Physical Therapy Treatment  Patient Details  Name: James [name] MRN: [id number] Date of Birth: [birth year] Referring Provider: Dorothyann Peng, PA  Encounter Date: 09/25/2016      PT End of Session - 09/25/16 1153    Visit Number 20   Date for PT Re-Evaluation 10/24/16   PT Start Time 1143   PT Stop Time 1226   PT Time Calculation (min) 43 min   Activity Tolerance Patient limited by fatigue   Behavior During Therapy [facility] for tasks assessed/performed      Past Medical History:  Diagnosis Date  . Arthritis   . Chicken pox   . Hypertension   . Joint pain   . Obesity   . Osteoarthritis   . Prediabetes   . Swelling    feet and legs    Past Surgical History:  Procedure Laterality Date  . cartilage removal    . KNEE SURGERY  2000    There were no vitals filed for this visit.      Subjective Assessment - 09/25/16 1152    Subjective Feeling better the last couple of days. Hip not too bad today.    Pertinent History Pt has wife with disability and he cares for her, Lt hip OA-significant   Limitations Standing;Walking   How long can you stand comfortably? Rt>Lt weightbearing 25 minutes   How long can you walk comfortably? using cane or walker, antalgic: limited to <5 minutes   Diagnostic tests Lt hip x-ray: significant OA- 3 years ago   Patient Stated Goals reduce hip pain, improve LE strength, lose weight   Currently in Pain? Yes   Pain Score 2    Pain Location Hip   Pain Orientation Left   Pain Descriptors / Indicators Aching;Sore   Pain Type Chronic pain   Pain Radiating Towards Rt calf   Pain Onset More than a month ago   Pain Frequency Constant                         [facility] Adult PT Treatment/Exercise - 09/25/16 0001      Knee/Hip Exercises: Stretches   Active Hamstring Stretch  Left;3 reps;20 seconds  on floor seated   Quad Stretch Both;2 reps;10 seconds   Gastroc Stretch Both;2 reps;10 seconds  Slant board     Knee/Hip Exercises: Aerobic   Nustep Level 2 x 8 minutes-slow mobility  therapist present to discuss treatment     Knee/Hip Exercises: Standing   Hip Abduction AROM;Right;Left;10 reps;1 set   Hip Extension AROM;Right;Left;10 reps;1 set     Knee/Hip Exercises: Seated   Long Arc Quad Strengthening;Right;Left;10 reps   Long Arc Quad Weight 3 lbs.   Ball Squeeze 20x   Clamshell with TheraBand Green  3x10     Knee/Hip Exercises: Prone   Hip Extension AROM;Left;10 reps                  PT Short Term Goals - 09/18/16 1137      PT SHORT TERM GOAL #4   Title report a 25% reduction in Lt hip pain with standing and walking   Baseline 15% improvement   Time 4   Period Weeks   Status On-going           PT Long Term Goals - 09/18/16 1137  PT LONG TERM GOAL #1   Title be independent in advanced HEP   Time 6   Period Weeks   Status On-going     PT LONG TERM GOAL #2   Title reduce FOTO to < or = to 55% limitation   Baseline 66% limitation   Time 6   Period Weeks   Status On-going     PT LONG TERM GOAL #3   Title report a 30% reduction in Lt hip pain with standing and walking   Baseline 15% reduction   Time 6   Period Weeks   Status On-going               Plan - 09/25/16 1224    Clinical Impression Statement Patient with continued hip pain 2/10 reports feling like hip is doing well. Patient continues to be challegneged by current exercises and current level of resistance. Patient will continue to benefit from skilled therapy for LE strength and core strength.    Rehab Potential Good   PT Frequency 2x / week   PT Duration 8 weeks   PT Treatment/Interventions ADLs/Self Care Home Management;Cryotherapy;Electrical Stimulation;Iontophoresis 4mg /ml Dexamethasone;Functional mobility training;Stair training;Gait  training;Ultrasound;Moist Heat;Therapeutic activities;Therapeutic exercise;Neuromuscular re-education;Patient/family education;Passive range of motion;Manual techniques;Dry needling;Taping   PT Next Visit Plan Hip mobility, strength, weight shifting   Consulted and Agree with Plan of Care Patient      Patient will benefit from skilled therapeutic intervention in order to improve the following deficits and impairments:  Decreased strength, Abnormal gait, Pain, Impaired flexibility, Decreased activity tolerance, Decreased endurance, Difficulty walking, Decreased range of motion  Visit Diagnosis: Pain in left hip  Other abnormalities of gait and mobility  Muscle weakness (generalized)  Stiffness of left hip, not elsewhere classified     Problem List Patient Active Problem List   Diagnosis Date Noted  . Hyperglycemia 09/04/2016  . Severe obesity (BMI >= 40) ([place]) 01/15/2013  . OBESITY, MORBID 01/20/2007  . Essential hypertension 10/31/2006    Jeanie Sewer PTA 09/25/2016, 12:28 PM  [facility] [address], [place], [place], [address] Phone: [phone]   Fax:  [phone]  Name: James [name] MRN: [id number] Date of Birth: 12-04-1955

## 2016-09-27 ENCOUNTER — Ambulatory Visit (INDEPENDENT_AMBULATORY_CARE_PROVIDER_SITE_OTHER): Payer: BLUE CROSS/BLUE SHIELD | Admitting: Adult Health

## 2016-09-27 ENCOUNTER — Encounter: Payer: Self-pay | Admitting: Adult Health

## 2016-09-27 ENCOUNTER — Ambulatory Visit: Payer: BLUE CROSS/BLUE SHIELD

## 2016-09-27 VITALS — BP 108/64 | Temp 98.1°F | Ht 65.0 in | Wt 299.6 lb

## 2016-09-27 DIAGNOSIS — Z0000 Encounter for general adult medical examination without abnormal findings: Secondary | ICD-10-CM

## 2016-09-27 DIAGNOSIS — I10 Essential (primary) hypertension: Secondary | ICD-10-CM

## 2016-09-27 DIAGNOSIS — M25552 Pain in left hip: Secondary | ICD-10-CM

## 2016-09-27 DIAGNOSIS — M25652 Stiffness of left hip, not elsewhere classified: Secondary | ICD-10-CM

## 2016-09-27 DIAGNOSIS — M6281 Muscle weakness (generalized): Secondary | ICD-10-CM

## 2016-09-27 DIAGNOSIS — R2689 Other abnormalities of gait and mobility: Secondary | ICD-10-CM

## 2016-09-27 LAB — PSA: PSA: 2.57 ng/mL (ref 0.10–4.00)

## 2016-09-27 MED ORDER — MELOXICAM 15 MG PO TABS: 15.0000 mg | ORAL_TABLET | Freq: Every day | ORAL | 0 refills | Status: DC | PRN

## 2016-09-27 NOTE — Therapy (Signed)
[facility] [address], [place], [place], [address] Phone: [phone]   Fax:  [phone]  Physical Therapy Treatment  Patient Details  Name: James [name] MRN: [id number] Date of Birth: 02-21-1956 Referring Provider: Dorothyann Peng, PA  Encounter Date: 09/27/2016      PT End of Session - 09/27/16 0918    Visit Number 21   Date for PT Re-Evaluation 10/24/16   PT Start Time 0830   PT Stop Time 0914   PT Time Calculation (min) 44 min   Activity Tolerance Patient limited by pain   Behavior During Therapy [facility] for tasks assessed/performed      Past Medical History:  Diagnosis Date  . Arthritis   . Chicken pox   . Hypertension   . Joint pain   . Obesity   . Osteoarthritis   . Prediabetes   . Swelling    feet and legs    Past Surgical History:  Procedure Laterality Date  . cartilage removal    . KNEE SURGERY  2000    There were no vitals filed for this visit.      Subjective Assessment - 09/27/16 0836    Subjective Pt saw MD this morning for check-up.   Patient Stated Goals reduce hip pain, improve LE strength, lose weight   Currently in Pain? Yes   Pain Score 5    Pain Location Hip   Pain Orientation Left   Pain Descriptors / Indicators Aching;Sore                         [facility] Adult PT Treatment/Exercise - 09/27/16 0001      Knee/Hip Exercises: Stretches   Active Hamstring Stretch Left;3 reps;20 seconds  on floor seated   Quad Stretch Both;2 reps;10 seconds   Gastroc Stretch Both;2 reps;10 seconds  Slant board     Knee/Hip Exercises: Aerobic   Nustep Level 2 x 7 minutes-slow mobility  therapist present to discuss treatment     Knee/Hip Exercises: Standing   Hip Abduction AROM;Right;Left;10 reps;1 set   Hip Extension --     Knee/Hip Exercises: Seated   Long Arc Quad Strengthening;Right;Left;10 reps   Long Arc Quad Weight 3 lbs.   Ball Squeeze 20x   Clamshell  with TheraBand Green  3x10     Knee/Hip Exercises: Prone   Hip Extension AROM;Left;10 reps     Manual Therapy   Passive ROM Manual stretching to Lt hip/ knee- supine and prone. Orange roller to [employer] for tissue mobilization                  PT Short Term Goals - 09/18/16 1137      PT SHORT TERM GOAL #4   Title report a 25% reduction in Lt hip pain with standing and walking   Baseline 15% improvement   Time 4   Period Weeks   Status On-going           PT Long Term Goals - 09/18/16 1137      PT LONG TERM GOAL #1   Title be independent in advanced HEP   Time 6   Period Weeks   Status On-going     PT LONG TERM GOAL #2   Title reduce FOTO to < or = to 55% limitation   Baseline 66% limitation   Time 6   Period Weeks   Status On-going     PT LONG TERM  GOAL #3   Title report a 30% reduction in Lt hip pain with standing and walking   Baseline 15% reduction   Time 6   Period Weeks   Status On-going               Plan - 09/27/16 7353    Clinical Impression Statement Pt with increased Lt hip pain this morning and rates the pains 5/10.  Pt with increased challenge with exercise today due to pain.  Current exercises continue to be challening for pt.  Pt with limited and slow mobility due to obesity and chronic nature of condition.  Pt will continue to benefit from skilled PT for strength, flexibility and endurance to improve mobility.    Rehab Potential Good   PT Frequency 2x / week   PT Duration 8 weeks   PT Treatment/Interventions ADLs/Self Care Home Management;Cryotherapy;Electrical Stimulation;Iontophoresis 4mg /ml Dexamethasone;Functional mobility training;Stair training;Gait training;Ultrasound;Moist Heat;Therapeutic activities;Therapeutic exercise;Neuromuscular re-education;Patient/family education;Passive range of motion;Manual techniques;Dry needling;Taping   PT Next Visit Plan Hip mobility, strength, weight shifting   Consulted and Agree with  Plan of Care Patient      Patient will benefit from skilled therapeutic intervention in order to improve the following deficits and impairments:  Decreased strength, Abnormal gait, Pain, Impaired flexibility, Decreased activity tolerance, Decreased endurance, Difficulty walking, Decreased range of motion  Visit Diagnosis: Pain in left hip  Other abnormalities of gait and mobility  Muscle weakness (generalized)  Stiffness of left hip, not elsewhere classified     Problem List Patient Active Problem List   Diagnosis Date Noted  . Hyperglycemia 09/04/2016  . Severe obesity (BMI >= 40) ([place]) 01/15/2013  . OBESITY, MORBID 01/20/2007  . Essential hypertension 10/31/2006    Sigurd Sos, PT 09/27/16 9:20 AM  [facility] [address] [place], [place], [address] Phone: [phone]   Fax:  [phone]  Name: MANVIR PRABHU MRN: [id number] Date of Birth: 04/10/1956

## 2016-09-27 NOTE — Progress Notes (Signed)
Subjective:    Patient ID: James [name], male    DOB: 07/05/1955, 61 y.o.   MRN: [id number]  HPI  Patient presents for yearly preventative medicine examination. He is a pleasant 61 year old male who  has a past medical history of Arthritis; Chicken pox; Hypertension; Joint pain; Obesity; Osteoarthritis; Prediabetes; and Swelling.  All immunizations and health maintenance protocols were reviewed with the patient and needed orders were placed.  Appropriate screening laboratory values were ordered for the patient  a PSA was ordered.  Medication reconciliation,  past medical history, social history, problem list and allergies were reviewed in detail with the patient  Goals were established with regard to weight loss, exercise, and  diet in compliance with medications. He is currently seeing Dr. Leafy Ro for weight loss management. He was started on Metformin 500mg  BID for pre diabetes. He denies any GI issues since starting Metformin.   Wt Readings from Last 3 Encounters:  09/27/16 299 lb 9.6 oz (135.9 kg)  09/17/16 299 lb (135.6 kg)  09/04/16 (!) 305 lb (138.3 kg)    He had many of his CPE labs drawn while at Dr. Arman Filter office. His A1c was 5.7 and Vitamin D was low at 28. He was started on Vit D Supplement, Thyroid profile was normal. LDL was slightly elevated at 112.   Essential Hypertension - Takes lisinopril 10 mg and HCTZ 25 mg. Blood pressure is controled when he takes his medications   He has no acute complaints today.   He continues to do PT for left hip and finds that he is making improvement in this aspect.    Review of Systems  Constitutional: Negative.   HENT: Negative.   Eyes: Negative.   Respiratory: Negative.   Cardiovascular: Negative.   Gastrointestinal: Negative.   Endocrine: Negative.   Genitourinary: Negative.   Musculoskeletal: Negative.   Skin: Negative.   Allergic/Immunologic: Negative.   Neurological: Negative.   Hematological: Negative.     Psychiatric/Behavioral: Negative.   All other systems reviewed and are negative.  Past Medical History:  Diagnosis Date  . Arthritis   . Chicken pox   . Hypertension   . Joint pain   . Obesity   . Osteoarthritis   . Prediabetes   . Swelling    feet and legs    Social History   Social History  . Marital status: Married    Spouse name: N/A  . Number of children: N/A  . Years of education: N/A   Occupational History  . At home caregiver    Social History Main Topics  . Smoking status: Former Smoker    Quit date: 07/16/1992  . Smokeless tobacco: Never Used  . Alcohol use Yes     Comment: very little per patient   . Drug use: No  . Sexual activity: Not on file   Other Topics Concern  . 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 cartilage removal    . KNEE SURGERY  2000    Family History  Problem Relation Age of Onset  . Cancer  Mother        lung and soft tissue cancer from radiation  . Stroke Mother   . Obesity Mother   . Heart disease Father        CHF  . Diabetes Father   . Hypertension Father   . Obesity Father   . Heart disease Maternal Grandfather   . Diabetes Paternal Grandmother   . Ovarian cancer Sister     No Known Allergies  Current Outpatient Prescriptions on File Prior to Visit  Medication Sig Dispense Refill  . Acetaminophen (TYLENOL PO) Take by mouth.    . cyclobenzaprine (FLEXERIL) 10 MG tablet Take 1 tablet (10 mg total) by mouth 3 (three) times daily as needed for muscle spasms. 30 tablet 0  . hydrochlorothiazide (HYDRODIURIL) 25 MG tablet TAKE 1 TABLET (25 MG TOTAL) BY MOUTH DAILY. 90 tablet 0  . ibuprofen (ADVIL,MOTRIN) 200 MG tablet  Take 200 mg by mouth every 6 (six) hours as needed.    Marland Kitchen lisinopril (PRINIVIL,ZESTRIL) 5 MG tablet TAKE 1 TABLET (5 MG TOTAL) BY MOUTH DAILY. 90 tablet 1  . meloxicam (MOBIC) 15 MG tablet Take 1 tablet (15 mg total) by mouth daily as needed for pain. 90 tablet 0  . metFORMIN (GLUCOPHAGE) 500 MG tablet Take 1 tablet (500 mg total) by mouth daily with breakfast. 30 tablet 0  . Multiple Vitamins-Minerals (MULTIVITAMIN WITH MINERALS) tablet Take 1 tablet by mouth daily.    . Vitamin D, Ergocalciferol, (DRISDOL) 50000 units CAPS capsule Take 1 capsule (50,000 Units total) by mouth every 7 (seven) days. 30 capsule 0   No current facility-administered medications on file prior to visit.     BP 108/64 (BP Location: Left Arm, Patient Position: Sitting, Cuff Size: Large)   Temp 98.1 F (36.7 C) (Oral)   Ht 5\' 5"  (1.651 m)   Wt 299 lb 9.6 oz (135.9 kg)   BMI 49.86 kg/m       Objective:   Physical Exam  Constitutional: He is oriented to person, place, and time. He appears well-developed and well-nourished. No distress.  HENT:  Head: Normocephalic and atraumatic.  Right Ear: External ear normal.  Left Ear: External ear normal.  Nose: Nose normal.  Mouth/Throat: Oropharynx is clear and moist. No oropharyngeal exudate.  Eyes: Conjunctivae are normal. Pupils are equal, round, and reactive to light. Right eye exhibits no discharge. Left eye exhibits no discharge. No scleral icterus.  Neck: Trachea normal and normal range of motion. Neck supple. No JVD present. Carotid bruit is not present. No tracheal deviation present. No thyroid mass and no thyromegaly present.  Cardiovascular: Normal rate, regular rhythm, normal heart sounds and intact distal pulses.  Exam reveals no gallop and no friction rub.   No murmur heard. Pulmonary/Chest: Effort normal and breath sounds normal. No stridor. No respiratory distress. He has no wheezes. He has no rales. He exhibits no tenderness.  Abdominal: Soft. Bowel  sounds are normal. He exhibits no distension and no mass. There is no tenderness. There is no rebound and no guarding.  Genitourinary:  Genitourinary Comments: Refused    Musculoskeletal: Normal range of motion. He exhibits no edema, tenderness or deformity.  Lymphadenopathy:    He has no cervical adenopathy.  Neurological: He is alert and oriented to person, place, and time. He has normal reflexes. He displays normal reflexes. No cranial nerve deficit. Coordination normal.  Skin: Skin is warm and dry. No rash noted. He is not diaphoretic. No erythema. No pallor.  Psychiatric:  He has a normal mood and affect. His behavior is normal. Judgment and thought content normal.  Nursing note and vitals reviewed.     Assessment & Plan:  1. Routine general medical examination at a health care facility - Labs done at weight loss clinic last month  - PSA - Follow up in one year  2. Essential hypertension - Well controlled   3. OBESITY, MORBID - Continue with weight loss clinic   4. Left hip pain  - meloxicam (MOBIC) 15 MG tablet; Take 1 tablet (15 mg total) by mouth daily as needed for pain.  Dispense: 90 tablet; Refill: 0  Dorothyann Peng, NP

## 2016-10-02 ENCOUNTER — Encounter: Payer: Self-pay | Admitting: Physical Therapy

## 2016-10-02 ENCOUNTER — Ambulatory Visit: Payer: BLUE CROSS/BLUE SHIELD | Admitting: Physical Therapy

## 2016-10-02 ENCOUNTER — Ambulatory Visit (INDEPENDENT_AMBULATORY_CARE_PROVIDER_SITE_OTHER): Payer: BLUE CROSS/BLUE SHIELD | Admitting: Family Medicine

## 2016-10-02 VITALS — BP 112/71 | HR 67 | Temp 98.3°F | Ht 65.0 in | Wt 293.0 lb

## 2016-10-02 DIAGNOSIS — M25652 Stiffness of left hip, not elsewhere classified: Secondary | ICD-10-CM

## 2016-10-02 DIAGNOSIS — Z9189 Other specified personal risk factors, not elsewhere classified: Secondary | ICD-10-CM | POA: Insufficient documentation

## 2016-10-02 DIAGNOSIS — R7303 Prediabetes: Secondary | ICD-10-CM

## 2016-10-02 DIAGNOSIS — M6281 Muscle weakness (generalized): Secondary | ICD-10-CM

## 2016-10-02 DIAGNOSIS — M25552 Pain in left hip: Secondary | ICD-10-CM

## 2016-10-02 DIAGNOSIS — R2689 Other abnormalities of gait and mobility: Secondary | ICD-10-CM

## 2016-10-02 MED ORDER — METFORMIN HCL 500 MG PO TABS: 500.0000 mg | ORAL_TABLET | Freq: Every day | ORAL | 0 refills | Status: DC

## 2016-10-02 NOTE — Therapy (Signed)
[facility] [address] [place], [place], [address] Phone: [phone]   Fax:  [phone]  Physical Therapy Treatment  Patient Details  Name: James [name] MRN: [id number] Date of Birth: 06-29-1955 Referring Provider: Dorothyann Peng, PA  Encounter Date: 10/02/2016      PT End of Session - 10/02/16 1132    Visit Number 22   Date for PT Re-Evaluation 10/24/16   PT Start Time 1057   PT Stop Time 1138   PT Time Calculation (min) 41 min   Activity Tolerance Patient limited by pain   Behavior During Therapy [facility] for tasks assessed/performed      Past Medical History:  Diagnosis Date  . Arthritis   . Chicken pox   . Hypertension   . Joint pain   . Obesity   . Osteoarthritis   . Prediabetes   . Swelling    feet and legs    Past Surgical History:  Procedure Laterality Date  . cartilage removal    . KNEE SURGERY  2000    There were no vitals filed for this visit.      Subjective Assessment - 10/02/16 1103    Subjective Patient reports hip doing pretty good today. Since my daughter has been home shes been helping me to the leg stretches and that's helping.    Pertinent History Pt has wife with disability and he cares for her, Lt hip OA-significant   Limitations Standing;Walking   How long can you stand comfortably? Rt>Lt weightbearing 25 minutes   How long can you walk comfortably? using cane or walker, antalgic: limited to <5 minutes   Diagnostic tests Lt hip x-ray: significant OA- 3 years ago   Patient Stated Goals reduce hip pain, improve LE strength, lose weight   Currently in Pain? Yes   Pain Score 1                          [facility] Adult PT Treatment/Exercise - 10/02/16 0001      Knee/Hip Exercises: Stretches   Active Hamstring Stretch Left;3 reps;20 seconds  on floor seated   Quad Stretch Both;2 reps;10 seconds   Gastroc Stretch Both;2 reps;10 seconds  Slant board     Knee/Hip Exercises: Aerobic   Nustep Level 2 x 7 minutes- LE only  therapist present to discuss treatment     Knee/Hip Exercises: Standing   Knee Flexion Strengthening;Both;2 sets;10 reps  #3 hamstring curls   Hip Abduction AROM;Right;Left;10 reps;1 set   Hip Extension AROM;Right;Left;10 reps;1 set     Knee/Hip Exercises: Seated   Long Arc Quad Strengthening;Right;Left;10 reps   Long Arc Quad Weight 3 lbs.   Ball Squeeze 20x   Clamshell with TheraBand Green  3x10     Knee/Hip Exercises: Prone   Hip Extension --                  PT Short Term Goals - 10/02/16 1144      PT SHORT TERM GOAL #4   Title report a 25% reduction in Lt hip pain with standing and walking   Baseline 15% improvement   Time 4   Period Weeks   Status On-going           PT Long Term Goals - 10/02/16 1144      PT LONG TERM GOAL #2   Title reduce FOTO to < or = to 55% limitation   Baseline 66% limitation  Time 6   Period Weeks   Status On-going     PT LONG TERM GOAL #3   Title report a 30% reduction in Lt hip pain with standing and walking   Baseline 15% reduction   Time 6   Period Weeks   Status On-going     PT LONG TERM GOAL #5   Title report compliance with regular strength and cardio exercise for weight loss   Baseline 1x/wk    Time 6   Period Weeks   Status On-going               Plan - 10/02/16 1145    Clinical Impression Statement Patient reports minimal hip pain today from increased stretching at home. Continues to have antalgic gait and slow movement. Patient will continue to benefit from skilled therapy for LE strengthening and stability.    Rehab Potential Good   PT Frequency 2x / week   PT Duration 8 weeks   PT Next Visit Plan Hip mobility, strength, weight shifting   Consulted and Agree with Plan of Care Patient      Patient will benefit from skilled therapeutic intervention in order to improve the following deficits and impairments:  Decreased  strength, Abnormal gait, Pain, Impaired flexibility, Decreased activity tolerance, Decreased endurance, Difficulty walking, Decreased range of motion  Visit Diagnosis: Pain in left hip  Other abnormalities of gait and mobility  Muscle weakness (generalized)  Stiffness of left hip, not elsewhere classified     Problem List Patient Active Problem List   Diagnosis Date Noted  . Hyperglycemia 09/04/2016  . Severe obesity (BMI >= 40) ([place]) 01/15/2013  . OBESITY, MORBID 01/20/2007  . Essential hypertension 10/31/2006    Jeanie Sewer PTA 10/02/2016, 11:54 AM  [facility] [address] [place], [place], [address] Phone: [phone]   Fax:  [phone]  Name: James [name] MRN: [id number] Date of Birth: 1956/05/01

## 2016-10-03 NOTE — Progress Notes (Signed)
Office: [phone]  /  Fax: [phone]   HPI:   Chief Complaint: OBESITY Vincente is here to discuss his progress with his obesity treatment plan. He is on the  follow the Category 3 plan and is following his eating plan approximately 99 % of the time. He states he is exercising PT 2 times per week or pool 1 time per week, PT at home 20 to 45 minutes 6 times per week. Nas continues to do well with weight loss. He is working on increasing protein and eating more at home. He is tired of all his protein at dinner. His weight is 293 lb (132.9 kg) today and has had a weight loss of 6 pounds over a period of 2 weeks since his last visit. He has lost 12 lbs since starting treatment with [name].  Pre-Diabetes Arash has a diagnosis of pre-diabetes based on his elevated Hgb A1c and was informed this puts him at greater risk of developing diabetes. He started taking metformin 2 weeks ago, last A1c was at 28.3 and he is doing well with det prescription and weight loss efforts. He continues to work on diet and exercise to decrease risk of diabetes. He denies nausea, diarrhea or hypoglycemia.  ALLERGIES: No Known Allergies  MEDICATIONS: Current Outpatient Prescriptions on File Prior to Visit  Medication Sig Dispense Refill  . Acetaminophen (TYLENOL PO) Take by mouth.    . cyclobenzaprine (FLEXERIL) 10 MG tablet Take 1 tablet (10 mg total) by mouth 3 (three) times daily as needed for muscle spasms. 30 tablet 0  . hydrochlorothiazide (HYDRODIURIL) 25 MG tablet TAKE 1 TABLET (25 MG TOTAL) BY MOUTH DAILY. 90 tablet 0  . ibuprofen (ADVIL,MOTRIN) 200 MG tablet Take 200 mg by mouth every 6 (six) hours as needed.    Marland Kitchen lisinopril (PRINIVIL,ZESTRIL) 5 MG tablet TAKE 1 TABLET (5 MG TOTAL) BY MOUTH DAILY. 90 tablet 1  . meloxicam (MOBIC) 15 MG tablet Take 1 tablet (15 mg total) by mouth daily as needed for pain. 90 tablet 0  . Multiple Vitamins-Minerals (MULTIVITAMIN WITH MINERALS) tablet Take 1 tablet by mouth  daily.    . Vitamin D, Ergocalciferol, (DRISDOL) 50000 units CAPS capsule Take 1 capsule (50,000 Units total) by mouth every 7 (seven) days. 30 capsule 0   No current facility-administered medications on file prior to visit.     PAST MEDICAL HISTORY: Past Medical History:  Diagnosis Date  . Arthritis   . Chicken pox   . Hypertension   . Joint pain   . Obesity   . Osteoarthritis   . Prediabetes   . Swelling    feet and legs    PAST SURGICAL HISTORY: Past Surgical History:  Procedure Laterality Date  . cartilage removal    . KNEE SURGERY  2000    SOCIAL HISTORY: Social History  Substance Use Topics  . Smoking status: Former Smoker    Quit date: 07/16/1992  . Smokeless tobacco: Never Used  . Alcohol use Yes     Comment: very little per patient     FAMILY HISTORY: Family History  Problem Relation Age of Onset  . Cancer Mother        lung and soft tissue cancer from radiation  . Stroke Mother   . Obesity Mother   . Heart disease Father        CHF  . Diabetes Father   . Hypertension Father   . Obesity Father   . Heart disease Maternal Grandfather   .  Diabetes Paternal Grandmother   . Ovarian cancer Sister     ROS: Review of Systems  Constitutional: Positive for weight loss.  Gastrointestinal: Negative for diarrhea and nausea.  Endo/Heme/Allergies:       Negative hypoglycemia    PHYSICAL EXAM: Blood pressure 112/71, pulse 67, temperature 98.3 F (36.8 C), temperature source Oral, height 5\' 5"  (1.651 m), weight 293 lb (132.9 kg), SpO2 96 %. Body mass index is 48.76 kg/m. Physical Exam  Constitutional: He is oriented to person, place, and time. He appears well-developed and well-nourished.  Cardiovascular: Normal rate.   Pulmonary/Chest: Effort normal.  Musculoskeletal: Normal range of motion.  Neurological: He is oriented to person, place, and time.  Skin: Skin is warm and dry.  Psychiatric: He has a normal mood and affect. His behavior is normal.    Vitals reviewed.   RECENT LABS AND TESTS: BMET    Component Value Date/Time   NA 140 09/04/2016 1141   K 4.1 09/04/2016 1141   CL 95 (L) 09/04/2016 1141   CO2 28 09/04/2016 1141   GLUCOSE 90 09/04/2016 1141   GLUCOSE 108 (H) 09/13/2015 0910   BUN 17 09/04/2016 1141   CREATININE 0.76 09/04/2016 1141   CALCIUM 9.3 09/04/2016 1141   GFRNONAA 98 09/04/2016 1141   GFRAA 113 09/04/2016 1141   Lab Results  Component Value Date   HGBA1C 5.7 (H) 09/04/2016   HGBA1C 5.9 09/13/2015   HGBA1C 5.8 07/17/2012   Lab Results  Component Value Date   INSULIN 17.2 09/04/2016   CBC    Component Value Date/Time   WBC 5.7 09/04/2016 1141   WBC 6.4 09/13/2015 0910   RBC 4.98 09/04/2016 1141   RBC 4.96 09/13/2015 0910   HGB 14.8 09/13/2015 0910   HCT 44.1 09/04/2016 1141   PLT 192 09/04/2016 1141   MCV 89 09/04/2016 1141   MCH 29.9 09/04/2016 1141   MCHC 33.8 09/04/2016 1141   MCHC 33.6 09/13/2015 0910   RDW 14.6 09/04/2016 1141   LYMPHSABS 1.6 09/04/2016 1141   MONOABS 0.5 09/13/2015 0910   EOSABS 0.0 09/04/2016 1141   BASOSABS 0.0 09/04/2016 1141   Iron/TIBC/Ferritin/ %Sat No results found for: IRON, TIBC, FERRITIN, IRONPCTSAT Lipid Panel     Component Value Date/Time   CHOL 184 09/04/2016 1141   TRIG 84 09/04/2016 1141   HDL 55 09/04/2016 1141   CHOLHDL 3 09/13/2015 0910   VLDL 14.2 09/13/2015 0910   LDLCALC 112 (H) 09/04/2016 1141   Hepatic Function Panel     Component Value Date/Time   PROT 6.8 09/04/2016 1141   ALBUMIN 4.1 09/04/2016 1141   AST 18 09/04/2016 1141   ALT 22 09/04/2016 1141   ALKPHOS 67 09/04/2016 1141   BILITOT 0.5 09/04/2016 1141   BILIDIR 0.1 11/14/2006 0928      Component Value Date/Time   TSH 1.830 09/04/2016 1141   TSH 1.42 11/30/2013 1318   TSH 1.95 11/14/2006 0928    ASSESSMENT AND PLAN: Prediabetes - Plan: metFORMIN (GLUCOPHAGE) 500 MG tablet  At risk for diabetes mellitus  Morbid obesity ([place])  PLAN:  Pre-Diabetes Christipher  will continue to work on weight loss, exercise, and decreasing simple carbohydrates in his diet to help decrease the risk of diabetes. We dicussed metformin including benefits and risks. He was informed that eating too many simple carbohydrates or too many calories at one sitting increases the likelihood of GI side effects. [name] requested metformin for now and a prescription was written today for 1  month refill. Brendon agreed to follow up with [name] as directed to monitor his progress.  Diabetes risk counselling Ruger was given extended (at least 15 minutes) diabetes prevention counseling today. He is 61 y.o. male and has risk factors for diabetes including obesity. We discussed intensive lifestyle modifications today with an emphasis on weight loss as well as increasing exercise and decreasing simple carbohydrates in his diet.  Obesity Hilario is currently in the action stage of change. As such, his goal is to continue with weight loss efforts He has agreed to follow the Category 3 plan with Pescatarian dinner Trae has been instructed to work up to a goal of 150 minutes of combined cardio and strengthening exercise per week for weight loss and overall health benefits. We discussed the following Behavioral Modification Strategies today: increasing lean protein intake and decreasing simple carbohydrates   Terryl has agreed to follow up with our clinic in 2 weeks. He was informed of the importance of frequent follow up visits to maximize his success with intensive lifestyle modifications for his multiple health conditions.  I, Doreene Nest, am acting as scribe for Dennard Nip, MD  I have reviewed the above documentation for accuracy and completeness, and I agree with the above. -Dennard Nip, MD  OBESITY BEHAVIORAL INTERVENTION VISIT  Today's visit was # 3 out of 22.  Starting weight: 305 lbs Starting date: 09/04/16 Today's weight : 293 lbs Today's date: 10/02/16 Total lbs lost to date: 12    (Patients must lose 7 lbs in the first 6 months to continue with counseling)   ASK: We discussed the diagnosis of obesity with [name] today and [name] agreed to give [name] permission to discuss obesity behavioral modification therapy today.  ASSESS: Llewellyn has the diagnosis of obesity and his BMI today is 48.9 Bracy is in the action stage of change   ADVISE: Lowell was educated on the multiple health risks of obesity as well as the benefit of weight loss to improve his health. He was advised of the need for long term treatment and the importance of lifestyle modifications.  AGREE: Multiple dietary modification options and treatment options were discussed and  Kyngston agreed to follow the Category 3 plan with Pescatarian dinner. We discussed the following Behavioral Modification Strategies today: increasing lean protein intake and decreasing simple carbohydrates

## 2016-10-04 ENCOUNTER — Encounter: Payer: Self-pay | Admitting: Physical Therapy

## 2016-10-04 ENCOUNTER — Ambulatory Visit: Payer: BLUE CROSS/BLUE SHIELD | Admitting: Physical Therapy

## 2016-10-04 DIAGNOSIS — M25652 Stiffness of left hip, not elsewhere classified: Secondary | ICD-10-CM

## 2016-10-04 DIAGNOSIS — R2689 Other abnormalities of gait and mobility: Secondary | ICD-10-CM

## 2016-10-04 DIAGNOSIS — M25552 Pain in left hip: Secondary | ICD-10-CM | POA: Diagnosis not present

## 2016-10-04 DIAGNOSIS — M6281 Muscle weakness (generalized): Secondary | ICD-10-CM

## 2016-10-04 NOTE — Therapy (Signed)
[facility] [address] [place], [place], [address] Phone: [phone]   Fax:  [phone]  Physical Therapy Treatment  Patient Details  Name: James [name] MRN: [id number] Date of Birth: Dec 30, 1955 Referring Provider: Dorothyann Peng, PA  Encounter Date: 10/04/2016      PT End of Session - 10/04/16 1105    Visit Number 23   Date for PT Re-Evaluation 10/24/16   PT Start Time 1057   PT Stop Time 1131  Patient wanting to end early   PT Time Calculation (min) 34 min   Activity Tolerance Patient limited by pain   Behavior During Therapy [facility] for tasks assessed/performed      Past Medical History:  Diagnosis Date  . Arthritis   . Chicken pox   . Hypertension   . Joint pain   . Obesity   . Osteoarthritis   . Prediabetes   . Swelling    feet and legs    Past Surgical History:  Procedure Laterality Date  . cartilage removal    . KNEE SURGERY  2000    There were no vitals filed for this visit.      Subjective Assessment - 10/04/16 1103    Subjective Patient reports Tuesday evening walking in his home and felt pop in hip that almost made him fall. Pain in groin around to hip.    Pertinent History Pt has wife with disability and he cares for her, Lt hip OA-significant   Limitations Standing;Walking   How long can you stand comfortably? Rt>Lt weightbearing 25 minutes   How long can you walk comfortably? using cane or walker, antalgic: limited to <5 minutes   Diagnostic tests Lt hip x-ray: significant OA- 3 years ago   Patient Stated Goals reduce hip pain, improve LE strength, lose weight   Currently in Pain? Yes   Pain Score 8    Pain Location Hip   Pain Orientation Left   Pain Descriptors / Indicators Aching;Sore   Pain Type Chronic pain                         [facility] Adult PT Treatment/Exercise - 10/04/16 0001      Knee/Hip Exercises: Stretches   Active Hamstring Stretch Left;3  reps;20 seconds  on floor seated   Quad Stretch Both;2 reps;10 seconds   Gastroc Stretch Both;2 reps;10 seconds  Slant board     Knee/Hip Exercises: Standing   Knee Flexion Strengthening;Both;2 sets;10 reps  #3 hamstring curls   Hip Abduction Right;Left;10 reps;1 set  #4   Hip Extension Right;Left;10 reps;1 set  #4     Knee/Hip Exercises: Seated   Long Arc Quad Strengthening;Right;Left;10 reps   Long Arc Quad Weight 4 lbs.   Ball Squeeze 20x   Clamshell with TheraBand Green  3x10   Hamstring Curl Strengthening;Both;2 sets;10 reps  green band                  PT Short Term Goals - 10/02/16 1144      PT SHORT TERM GOAL #4   Title report a 25% reduction in Lt hip pain with standing and walking   Baseline 15% improvement   Time 4   Period Weeks   Status On-going           PT Long Term Goals - 10/02/16 1144      PT LONG TERM GOAL #2   Title reduce FOTO to < or =  to 55% limitation   Baseline 66% limitation   Time 6   Period Weeks   Status On-going     PT LONG TERM GOAL #3   Title report a 30% reduction in Lt hip pain with standing and walking   Baseline 15% reduction   Time 6   Period Weeks   Status On-going     PT LONG TERM GOAL #5   Title report compliance with regular strength and cardio exercise for weight loss   Baseline 1x/wk    Time 6   Period Weeks   Status On-going               Plan - 10/04/16 1134    Clinical Impression Statement Patient having increased pain today after feeling pop while walking earlier this week. Patient able to tolerate resistance added to standing exercises well. Patient wanting to end session early due to needing to return home and not wanting to do too much to bother hip. Patient will continue to benefit from skilled therapy for LE strength and stability.    Rehab Potential Good   PT Frequency 2x / week   PT Duration 8 weeks   PT Treatment/Interventions ADLs/Self Care Home  Management;Cryotherapy;Electrical Stimulation;Iontophoresis 4mg /ml Dexamethasone;Functional mobility training;Stair training;Gait training;Ultrasound;Moist Heat;Therapeutic activities;Therapeutic exercise;Neuromuscular re-education;Patient/family education;Passive range of motion;Manual techniques;Dry needling;Taping   PT Next Visit Plan Hip mobility, strength, weight shifting   Consulted and Agree with Plan of Care Patient      Patient will benefit from skilled therapeutic intervention in order to improve the following deficits and impairments:  Decreased strength, Abnormal gait, Pain, Impaired flexibility, Decreased activity tolerance, Decreased endurance, Difficulty walking, Decreased range of motion  Visit Diagnosis: Pain in left hip  Other abnormalities of gait and mobility  Muscle weakness (generalized)  Stiffness of left hip, not elsewhere classified     Problem List Patient Active Problem List   Diagnosis Date Noted  . At risk for diabetes mellitus 10/02/2016  . Hyperglycemia 09/04/2016  . Severe obesity (BMI >= 40) ([place]) 01/15/2013  . OBESITY, MORBID 01/20/2007  . Essential hypertension 10/31/2006    Jeanie Sewer PTA 10/04/2016, 11:37 AM  [facility] [address], [place], [place], [address] Phone: [phone]   Fax:  [phone]  Name: James [name] MRN: [id number] Date of Birth: May 31, 1955

## 2016-10-08 ENCOUNTER — Other Ambulatory Visit: Payer: Self-pay | Admitting: *Deleted

## 2016-10-08 MED ORDER — HYDROCHLOROTHIAZIDE 25 MG PO TABS: ORAL_TABLET | ORAL | 1 refills | Status: DC

## 2016-10-09 ENCOUNTER — Ambulatory Visit: Payer: BLUE CROSS/BLUE SHIELD | Attending: Adult Health

## 2016-10-09 DIAGNOSIS — M6281 Muscle weakness (generalized): Secondary | ICD-10-CM | POA: Diagnosis present

## 2016-10-09 DIAGNOSIS — M25652 Stiffness of left hip, not elsewhere classified: Secondary | ICD-10-CM | POA: Diagnosis present

## 2016-10-09 DIAGNOSIS — M25552 Pain in left hip: Secondary | ICD-10-CM | POA: Insufficient documentation

## 2016-10-09 DIAGNOSIS — R2689 Other abnormalities of gait and mobility: Secondary | ICD-10-CM | POA: Diagnosis present

## 2016-10-09 NOTE — Therapy (Signed)
[facility] [address] [place], [place], [address] Phone: [phone]   Fax:  [phone]  Physical Therapy Treatment  Patient Details  Name: James [name] MRN: [id number] Date of Birth: 1956-04-15 Referring Provider: Dorothyann Peng, PA  Encounter Date: 10/09/2016      PT End of Session - 10/09/16 1224    Visit Number 24   Date for PT Re-Evaluation 10/24/16   PT Start Time 1146   PT Stop Time 1224   PT Time Calculation (min) 38 min   Activity Tolerance Patient limited by pain   Behavior During Therapy [facility] for tasks assessed/performed      Past Medical History:  Diagnosis Date  . Arthritis   . Chicken pox   . Hypertension   . Joint pain   . Obesity   . Osteoarthritis   . Prediabetes   . Swelling    feet and legs    Past Surgical History:  Procedure Laterality Date  . cartilage removal    . KNEE SURGERY  2000    There were no vitals filed for this visit.      Subjective Assessment - 10/09/16 1157    Subjective Fatigue and pain today.     Currently in Pain? Yes   Pain Score 7    Pain Location Hip   Pain Orientation Left   Pain Descriptors / Indicators Aching;Sore   Pain Type Chronic pain   Pain Onset More than a month ago   Pain Frequency Constant   Aggravating Factors  standing, increased activity   Pain Relieving Factors medication, stretching, rest                         [facility] Adult PT Treatment/Exercise - 10/09/16 0001      Knee/Hip Exercises: Stretches   Active Hamstring Stretch Left;3 reps;20 seconds  on floor seated   Quad Stretch Both;2 reps;10 seconds   Gastroc Stretch Both;2 reps;10 seconds  Slant board     Knee/Hip Exercises: Aerobic   Nustep Level 2 x 5 minutes- LE only  therapist present to discuss treatment     Knee/Hip Exercises: Standing   Hip Abduction Right;Left;10 reps;1 set   Hip Extension Right;Left;10 reps;1 set     Knee/Hip Exercises:  Seated   Long Arc Quad Strengthening;Right;Left;10 reps   Ball Squeeze 20x     Manual Therapy   Passive ROM Manual stretching to Lt hip/ knee- supine and prone. Orange roller to [employer] for tissue mobilization                  PT Short Term Goals - 10/02/16 1144      PT SHORT TERM GOAL #4   Title report a 25% reduction in Lt hip pain with standing and walking   Baseline 15% improvement   Time 4   Period Weeks   Status On-going           PT Long Term Goals - 10/09/16 1219      PT LONG TERM GOAL #4   Title improve LE strength to perform sit to stand with minimal to no UE support   Time 6   Status On-going     PT LONG TERM GOAL #5   Title report compliance with regular strength and cardio exercise for weight loss   Baseline 1x/wk    Time 6   Period Weeks   Status On-going  Plan - 10/09/16 1215    Clinical Impression Statement Pt with increased pain today as he has been more active.  Weights were not used today with exercises due to pain.  Pt with chronic pain and mobility deficits to to OA and obesity.  Pt will benefit from 2 more weeks of PT to address strength, flexibility and mobility.     Rehab Potential Good   PT Frequency 2x / week   PT Duration 8 weeks   PT Treatment/Interventions ADLs/Self Care Home Management;Cryotherapy;Electrical Stimulation;Iontophoresis 4mg /ml Dexamethasone;Functional mobility training;Stair training;Gait training;Ultrasound;Moist Heat;Therapeutic activities;Therapeutic exercise;Neuromuscular re-education;Patient/family education;Passive range of motion;Manual techniques;Dry needling;Taping   PT Next Visit Plan Hip mobility, strength, weight shifting   Recommended Other Services cert and recert have have been signed   Consulted and Agree with Plan of Care Patient      Patient will benefit from skilled therapeutic intervention in order to improve the following deficits and impairments:  Decreased strength,  Abnormal gait, Pain, Impaired flexibility, Decreased activity tolerance, Decreased endurance, Difficulty walking, Decreased range of motion  Visit Diagnosis: Pain in left hip  Other abnormalities of gait and mobility  Muscle weakness (generalized)  Stiffness of left hip, not elsewhere classified     Problem List Patient Active Problem List   Diagnosis Date Noted  . At risk for diabetes mellitus 10/02/2016  . Hyperglycemia 09/04/2016  . Severe obesity (BMI >= 40) ([place]) 01/15/2013  . OBESITY, MORBID 01/20/2007  . Essential hypertension 10/31/2006    James [name], PT 10/09/16 12:26 PM  [facility] [address] [place], [place], [address] Phone: [phone]   Fax:  [phone]  Name: James [name] MRN: [id number] Date of Birth: 20-Sep-1955

## 2016-10-11 ENCOUNTER — Ambulatory Visit: Payer: BLUE CROSS/BLUE SHIELD

## 2016-10-11 DIAGNOSIS — M25652 Stiffness of left hip, not elsewhere classified: Secondary | ICD-10-CM

## 2016-10-11 DIAGNOSIS — M25552 Pain in left hip: Secondary | ICD-10-CM

## 2016-10-11 DIAGNOSIS — M6281 Muscle weakness (generalized): Secondary | ICD-10-CM

## 2016-10-11 DIAGNOSIS — R2689 Other abnormalities of gait and mobility: Secondary | ICD-10-CM

## 2016-10-11 NOTE — Therapy (Signed)
[facility] [address], [place], [place], [address] Phone: [phone]   Fax:  [phone]  Physical Therapy Treatment  Patient Details  Name: SHERVIN CYPERT MRN: [id number] Date of Birth: 1955/05/26 Referring Provider: Dorothyann Peng, PA  Encounter Date: 10/11/2016      PT End of Session - 10/11/16 1205    Visit Number 25   Date for PT Re-Evaluation 10/24/16   PT Start Time 1142   PT Stop Time 1220   PT Time Calculation (min) 38 min   Activity Tolerance Patient tolerated treatment well   Behavior During Therapy [facility] for tasks assessed/performed      Past Medical History:  Diagnosis Date  . Arthritis   . Chicken pox   . Hypertension   . Joint pain   . Obesity   . Osteoarthritis   . Prediabetes   . Swelling    feet and legs    Past Surgical History:  Procedure Laterality Date  . cartilage removal    . KNEE SURGERY  2000    There were no vitals filed for this visit.      Subjective Assessment - 10/11/16 1148    Subjective Fatigue and pain today.  I had to take a muscle relaxer.   Currently in Pain? Yes   Pain Score 7    Pain Location Hip   Pain Orientation Left   Pain Descriptors / Indicators Aching;Sore   Pain Type Chronic pain                         [facility] Adult PT Treatment/Exercise - 10/11/16 0001      Knee/Hip Exercises: Stretches   Active Hamstring Stretch Left;3 reps;20 seconds  on floor seated   Quad Stretch Both;2 reps;10 seconds   Gastroc Stretch Both;2 reps;10 seconds  Slant board     Knee/Hip Exercises: Aerobic   Nustep Level 2 x 7 minutes- LE only  therapist present to discuss treatment     Knee/Hip Exercises: Standing   Hip Abduction Right;Left;10 reps;2 sets   Hip Extension Right;Left;10 reps;2 sets     Knee/Hip Exercises: Seated   Long Arc Quad Strengthening;Right;Left;10 reps   Ball Squeeze 20x  supine     Manual Therapy   Passive ROM Manual  stretching to Lt hip/ knee- supine and prone. Orange roller to [employer] for tissue mobilization                  PT Short Term Goals - 10/02/16 1144      PT SHORT TERM GOAL #4   Title report a 25% reduction in Lt hip pain with standing and walking   Baseline 15% improvement   Time 4   Period Weeks   Status On-going           PT Long Term Goals - 10/09/16 1219      PT LONG TERM GOAL #4   Title improve LE strength to perform sit to stand with minimal to no UE support   Time 6   Status On-going     PT LONG TERM GOAL #5   Title report compliance with regular strength and cardio exercise for weight loss   Baseline 1x/wk    Time 6   Period Weeks   Status On-going               Plan - 10/11/16 1148    Clinical Impression Statement Pt with  increased pain this week due to increased activity.  No weights again today due to pain.  Pt with chronic pain and mobility deficits due to OA and obesity.  Pt will benefit from 1-2 more weeks of PT to address strength, flexiblity and mobility.     Rehab Potential Good   PT Frequency 2x / week   PT Duration 8 weeks   PT Treatment/Interventions ADLs/Self Care Home Management;Cryotherapy;Electrical Stimulation;Iontophoresis 4mg /ml Dexamethasone;Functional mobility training;Stair training;Gait training;Ultrasound;Moist Heat;Therapeutic activities;Therapeutic exercise;Neuromuscular re-education;Patient/family education;Passive range of motion;Manual techniques;Dry needling;Taping   PT Next Visit Plan Hip mobility, strength, weight shifting   Consulted and Agree with Plan of Care Patient      Patient will benefit from skilled therapeutic intervention in order to improve the following deficits and impairments:  Decreased strength, Abnormal gait, Pain, Impaired flexibility, Decreased activity tolerance, Decreased endurance, Difficulty walking, Decreased range of motion  Visit Diagnosis: Pain in left hip  Other abnormalities of  gait and mobility  Muscle weakness (generalized)  Stiffness of left hip, not elsewhere classified     Problem List Patient Active Problem List   Diagnosis Date Noted  . At risk for diabetes mellitus 10/02/2016  . Hyperglycemia 09/04/2016  . Severe obesity (BMI >= 40) ([place]) 01/15/2013  . OBESITY, MORBID 01/20/2007  . Essential hypertension 10/31/2006     Sigurd Sos, PT 10/11/16 12:09 PM  [facility] [address], [place], [place], [address] Phone: [phone]   Fax:  [phone]  Name: SENECA HOBACK MRN: [id number] Date of Birth: 12-23-1955

## 2016-10-16 ENCOUNTER — Ambulatory Visit: Payer: BLUE CROSS/BLUE SHIELD | Admitting: Physical Therapy

## 2016-10-16 DIAGNOSIS — M25652 Stiffness of left hip, not elsewhere classified: Secondary | ICD-10-CM

## 2016-10-16 DIAGNOSIS — M25552 Pain in left hip: Secondary | ICD-10-CM | POA: Diagnosis not present

## 2016-10-16 DIAGNOSIS — R2689 Other abnormalities of gait and mobility: Secondary | ICD-10-CM

## 2016-10-16 DIAGNOSIS — M6281 Muscle weakness (generalized): Secondary | ICD-10-CM

## 2016-10-16 NOTE — Therapy (Signed)
[facility] [address] [place], [place], [address] Phone: [phone]   Fax:  [phone]  Physical Therapy Treatment  Patient Details  Name: James [name] MRN: [id number] Date of Birth: July 03, 1955 Referring Provider: Dorothyann Peng, PA  Encounter Date: 10/16/2016      PT End of Session - 10/16/16 1221    Visit Number 26   Date for PT Re-Evaluation 10/24/16   PT Start Time 1287   PT Stop Time 1228   PT Time Calculation (min) 43 min   Activity Tolerance Patient tolerated treatment well      Past Medical History:  Diagnosis Date  . Arthritis   . Chicken pox   . Hypertension   . Joint pain   . Obesity   . Osteoarthritis   . Prediabetes   . Swelling    feet and legs    Past Surgical History:  Procedure Laterality Date  . cartilage removal    . KNEE SURGERY  2000    There were no vitals filed for this visit.      Subjective Assessment - 10/16/16 1148    Subjective Kind of sore and stiff, might be the rain.  It gets tired really fast and that's when the pain gets kicked up.  My knees are sore today.     Currently in Pain? Yes   Pain Score 5    Pain Location Hip   Pain Orientation Left   Pain Type Chronic pain   Aggravating Factors  prolonged standing, activity   Pain Relieving Factors medication, stretching, rest                         [facility] Adult PT Treatment/Exercise - 10/16/16 0001      Knee/Hip Exercises: Aerobic   Nustep Nu-Step L1 8 min arms and legs     Knee/Hip Exercises: Standing   Hip Abduction Right;Left;10 reps;2 sets   Hip Extension Right;Left;10 reps;2 sets     Knee/Hip Exercises: Seated   Ball Squeeze 20x   Clamshell with TheraBand Green  3x10     Knee/Hip Exercises: Prone   Hamstring Curl Limitations gluteal squeezes 3x5   Straight Leg Raises Limitations gluteal set in roadkill position right /left 10x     Manual Therapy   Manual therapy comments  contract-relax hip flexors 5x with hip flexor stretch in supine   Joint Mobilization left hip long axis distraction grade 3 6x;  prone PA mobs and in internal/external rotation grade 3 3x each   Passive ROM Manual stretching to Lt hip/ knee- supine and prone                  PT Short Term Goals - 10/16/16 1230      PT SHORT TERM GOAL #1   Title be independent in initial HEP   Status Achieved     PT SHORT TERM GOAL #2   Title improve LE strength to perform sit to stand with moderate UE support   Status Achieved     PT SHORT TERM GOAL #3   Title initiate cardio exercise in the pool with walking > or = to 2x/wk   Status Achieved     PT SHORT TERM GOAL #4   Title report a 25% reduction in Lt hip pain with standing and walking   Time 4   Period Weeks   Status On-going  PT Long Term Goals - 10/16/16 1231      PT LONG TERM GOAL #1   Title be independent in advanced HEP   Time 6   Period Weeks   Status On-going     PT LONG TERM GOAL #2   Title reduce FOTO to < or = to 55% limitation   Time 6   Period Weeks   Status On-going     PT LONG TERM GOAL #3   Title report a 30% reduction in Lt hip pain with standing and walking   Time 6   Period Weeks   Status On-going     PT LONG TERM GOAL #4   Title improve LE strength to perform sit to stand with minimal to no UE support   Time 6   Period Weeks   Status On-going     PT LONG TERM GOAL #5   Title report compliance with regular strength and cardio exercise for weight loss   Time 6   Period Weeks   Status On-going               Plan - 10/16/16 1222    Clinical Impression Statement Patient able to participate in low level strengthening and ROM ex's with less pain at end of treatment session 1/10.  Reports a "good stretch" with hip PA mobs for extension and hip flexor stretches.  He should meet remaining goals in next few visits.  He plans to continue his HEP upon discharge.     Rehab Potential  Good   PT Frequency 2x / week   PT Duration 8 weeks   PT Treatment/Interventions ADLs/Self Care Home Management;Cryotherapy;Electrical Stimulation;Iontophoresis 4mg /ml Dexamethasone;Functional mobility training;Stair training;Gait training;Ultrasound;Moist Heat;Therapeutic activities;Therapeutic exercise;Neuromuscular re-education;Patient/family education;Passive range of motion;Manual techniques;Dry needling;Taping   PT Next Visit Plan Hip mobility, strength, weight shifting      Patient will benefit from skilled therapeutic intervention in order to improve the following deficits and impairments:  Decreased strength, Abnormal gait, Pain, Impaired flexibility, Decreased activity tolerance, Decreased endurance, Difficulty walking, Decreased range of motion  Visit Diagnosis: Pain in left hip  Other abnormalities of gait and mobility  Muscle weakness (generalized)  Stiffness of left hip, not elsewhere classified     Problem List Patient Active Problem List   Diagnosis Date Noted  . At risk for diabetes mellitus 10/02/2016  . Hyperglycemia 09/04/2016  . Severe obesity (BMI >= 40) ([place]) 01/15/2013  . OBESITY, MORBID 01/20/2007  . Essential hypertension 10/31/2006   Ruben Im, PT 10/16/16 12:33 PM Phone: [phone] Fax: [phone]  Alvera Singh 10/16/2016, 12:32 PM  [facility] [address], [place], [place], [address] Phone: [phone]   Fax:  [phone]  Name: James [name] MRN: [id number] Date of Birth: 1955-11-20

## 2016-10-17 ENCOUNTER — Ambulatory Visit (INDEPENDENT_AMBULATORY_CARE_PROVIDER_SITE_OTHER): Payer: BLUE CROSS/BLUE SHIELD | Admitting: Family Medicine

## 2016-10-17 VITALS — BP 134/83 | HR 78 | Temp 97.9°F | Ht 65.0 in | Wt 288.0 lb

## 2016-10-17 DIAGNOSIS — E559 Vitamin D deficiency, unspecified: Secondary | ICD-10-CM | POA: Diagnosis not present

## 2016-10-17 DIAGNOSIS — K5900 Constipation, unspecified: Secondary | ICD-10-CM

## 2016-10-17 MED ORDER — POLYETHYLENE GLYCOL 3350 17 GM/SCOOP PO POWD: 17.0000 g | Freq: Every day | ORAL | 0 refills | Status: DC

## 2016-10-17 NOTE — Progress Notes (Signed)
Office: [phone]  /  Fax: [phone]   HPI:   Chief Complaint: OBESITY James [name] is here to discuss his progress with his obesity treatment plan. He is on the  follow the Category 3 plan and follow the Pescatrian eating plan and is following his eating plan approximately 100 % of the time. He states he is exercising physical therapy for 45 minutes 2 times per week and exercising at home for 20 minutes 4 times per week. [name] continues to do well with weight loss. He has been incorporating more meat in diet. Hip pain improved some with physical therapy. His weight is 288 lb (130.6 kg) today and has had a weight loss of 5 pounds over a period of 2 weeks since his last visit. He has lost 17 lbs since starting treatment with [name].  Vitamin D deficiency [name] has a diagnosis of vitamin D deficiency. He is currently taking vit D and denies nausea, vomiting or muscle weakness.  Constipation [name] notes constipation for the last few weeks, worse since attempting weight loss. He states BM are less frequent and are not hard and painful. He denies hematochezia or melena.    ALLERGIES: No Known Allergies  MEDICATIONS: Current Outpatient Prescriptions on File Prior to Visit  Medication Sig Dispense Refill  . Acetaminophen (TYLENOL PO) Take by mouth.    . cyclobenzaprine (FLEXERIL) 10 MG tablet Take 1 tablet (10 mg total) by mouth 3 (three) times daily as needed for muscle spasms. 30 tablet 0  . hydrochlorothiazide (HYDRODIURIL) 25 MG tablet TAKE 1 TABLET (25 MG TOTAL) BY MOUTH DAILY. 90 tablet 1  . ibuprofen (ADVIL,MOTRIN) 200 MG tablet Take 200 mg by mouth every 6 (six) hours as needed.    Marland Kitchen lisinopril (PRINIVIL,ZESTRIL) 5 MG tablet TAKE 1 TABLET (5 MG TOTAL) BY MOUTH DAILY. 90 tablet 1  . meloxicam (MOBIC) 15 MG tablet Take 1 tablet (15 mg total) by mouth daily as needed for pain. 90 tablet 0  . metFORMIN (GLUCOPHAGE) 500 MG tablet Take 1 tablet (500 mg total) by mouth daily with breakfast. 30  tablet 0  . Multiple Vitamins-Minerals (MULTIVITAMIN WITH MINERALS) tablet Take 1 tablet by mouth daily.    . Vitamin D, Ergocalciferol, (DRISDOL) 50000 units CAPS capsule Take 1 capsule (50,000 Units total) by mouth every 7 (seven) days. 30 capsule 0   No current facility-administered medications on file prior to visit.     PAST MEDICAL HISTORY: Past Medical History:  Diagnosis Date  . Arthritis   . Chicken pox   . Hypertension   . Joint pain   . Obesity   . Osteoarthritis   . Prediabetes   . Swelling    feet and legs    PAST SURGICAL HISTORY: Past Surgical History:  Procedure Laterality Date  . cartilage removal    . KNEE SURGERY  2000    SOCIAL HISTORY: Social History  Substance Use Topics  . Smoking status: Former Smoker    Quit date: 07/16/1992  . Smokeless tobacco: Never Used  . Alcohol use Yes     Comment: very little per patient     FAMILY HISTORY: Family History  Problem Relation Age of Onset  . Cancer Mother        lung and soft tissue cancer from radiation  . Stroke Mother   . Obesity Mother   . Heart disease Father        CHF  . Diabetes Father   . Hypertension Father   . Obesity  Father   . Heart disease Maternal Grandfather   . Diabetes Paternal Grandmother   . Ovarian cancer Sister     ROS: Review of Systems  Constitutional: Positive for weight loss.  Gastrointestinal: Positive for constipation. Negative for blood in stool, nausea and vomiting.  Musculoskeletal:       Negative muscle weakness    PHYSICAL EXAM: Blood pressure 134/83, pulse 78, temperature 97.9 F (36.6 C), temperature source Oral, height 5\' 5"  (1.651 m), weight 288 lb (130.6 kg), SpO2 94 %. Body mass index is 47.93 kg/m. Physical Exam  Constitutional: He is oriented to person, place, and time. He appears well-developed and well-nourished.  Cardiovascular: Normal rate.   Pulmonary/Chest: Effort normal.  Musculoskeletal: Normal range of motion.  Neurological: He is  oriented to person, place, and time.  Skin: Skin is warm and dry.  Psychiatric: He has a normal mood and affect. His behavior is normal.  Vitals reviewed.   RECENT LABS AND TESTS: BMET    Component Value Date/Time   NA 140 09/04/2016 1141   K 4.1 09/04/2016 1141   CL 95 (L) 09/04/2016 1141   CO2 28 09/04/2016 1141   GLUCOSE 90 09/04/2016 1141   GLUCOSE 108 (H) 09/13/2015 0910   BUN 17 09/04/2016 1141   CREATININE 0.76 09/04/2016 1141   CALCIUM 9.3 09/04/2016 1141   GFRNONAA 98 09/04/2016 1141   GFRAA 113 09/04/2016 1141   Lab Results  Component Value Date   HGBA1C 5.7 (H) 09/04/2016   HGBA1C 5.9 09/13/2015   HGBA1C 5.8 07/17/2012   Lab Results  Component Value Date   INSULIN 17.2 09/04/2016   CBC    Component Value Date/Time   WBC 5.7 09/04/2016 1141   WBC 6.4 09/13/2015 0910   RBC 4.98 09/04/2016 1141   RBC 4.96 09/13/2015 0910   HGB 14.9 09/04/2016 1141   HCT 44.1 09/04/2016 1141   PLT 192 09/04/2016 1141   MCV 89 09/04/2016 1141   MCH 29.9 09/04/2016 1141   MCHC 33.8 09/04/2016 1141   MCHC 33.6 09/13/2015 0910   RDW 14.6 09/04/2016 1141   LYMPHSABS 1.6 09/04/2016 1141   MONOABS 0.5 09/13/2015 0910   EOSABS 0.0 09/04/2016 1141   BASOSABS 0.0 09/04/2016 1141   Iron/TIBC/Ferritin/ %Sat No results found for: IRON, TIBC, FERRITIN, IRONPCTSAT Lipid Panel     Component Value Date/Time   CHOL 184 09/04/2016 1141   TRIG 84 09/04/2016 1141   HDL 55 09/04/2016 1141   CHOLHDL 3 09/13/2015 0910   VLDL 14.2 09/13/2015 0910   LDLCALC 112 (H) 09/04/2016 1141   Hepatic Function Panel     Component Value Date/Time   PROT 6.8 09/04/2016 1141   ALBUMIN 4.1 09/04/2016 1141   AST 18 09/04/2016 1141   ALT 22 09/04/2016 1141   ALKPHOS 67 09/04/2016 1141   BILITOT 0.5 09/04/2016 1141   BILIDIR 0.1 11/14/2006 0928      Component Value Date/Time   TSH 1.830 09/04/2016 1141   TSH 1.42 11/30/2013 1318   TSH 1.95 11/14/2006 0928    ASSESSMENT AND  PLAN: Constipation, unspecified constipation type  Vitamin D deficiency  Morbid obesity ([place])  PLAN:  Vitamin D Deficiency Isaih was informed that low vitamin D levels contributes to fatigue and are associated with obesity, breast, and colon cancer. He agrees to continue to take prescription Vit D @50 ,000 IU every week and will follow up for routine testing of vitamin D, at least 2-3 times per year. He was informed of  the risk of over-replacement of vitamin D and agrees to not increase his dose unless he discusses this with [name] first.  Constipation Milbert was informed decrease bowel movement frequency is normal while losing weight, but stools should not be hard or painful. He was advised to increase his H20 intake and work on increasing his fiber intake. High fiber foods were discussed today. Abanoub agrees to start Miralax 10 mg, one capful once daily and follow up with our clinic in 2 to 3 weeks.  Obesity Zymere is currently in the action stage of change. As such, his goal is to continue with weight loss efforts He has agreed to follow the Category 3 plan and follow the Pescatarian eating plan Dustan has been instructed to work up to a goal of 150 minutes of combined cardio and strengthening exercise per week for weight loss and overall health benefits. We discussed the following Behavioral Modification Strategies today: increasing lean protein intake and emotional eating strategies  Jensyn has agreed to follow up with our clinic in 2 to 3 weeks. He was informed of the importance of frequent follow up visits to maximize his success with intensive lifestyle modifications for his multiple health conditions.  I, Doreene Nest, am acting as scribe for Dennard Nip, MD  I have reviewed the above documentation for accuracy and completeness, and I agree with the above. -Dennard Nip, MD  OBESITY BEHAVIORAL INTERVENTION VISIT  Today's visit was # 4 out of 22.  Starting weight: 305 lbs Starting  date: 09/04/16 Today's weight : 288 lbs Today's date: 10/17/2016 Total lbs lost to date: 17 (Patients must lose 7 lbs in the first 6 months to continue with counseling)   ASK: We discussed the diagnosis of obesity with [name] today and [name] agreed to give [name] permission to discuss obesity behavioral modification therapy today.  ASSESS: Makya has the diagnosis of obesity and his BMI today is 61 Kreed is in the action stage of change   ADVISE: Quamaine was educated on the multiple health risks of obesity as well as the benefit of weight loss to improve his health. He was advised of the need for long term treatment and the importance of lifestyle modifications.  AGREE: Multiple dietary modification options and treatment options were discussed and  Nashaun agreed to follow the Category 3 plan and follow the Pescatarian eating plan We discussed the following Behavioral Modification Strategies today: increasing lean protein intake and emotional eating strategies

## 2016-10-18 ENCOUNTER — Ambulatory Visit: Payer: BLUE CROSS/BLUE SHIELD

## 2016-10-18 DIAGNOSIS — M25652 Stiffness of left hip, not elsewhere classified: Secondary | ICD-10-CM

## 2016-10-18 DIAGNOSIS — M6281 Muscle weakness (generalized): Secondary | ICD-10-CM

## 2016-10-18 DIAGNOSIS — R2689 Other abnormalities of gait and mobility: Secondary | ICD-10-CM

## 2016-10-18 DIAGNOSIS — M25552 Pain in left hip: Secondary | ICD-10-CM | POA: Diagnosis not present

## 2016-10-18 NOTE — Therapy (Signed)
[facility] [address], [place], [place], [address] Phone: [phone]   Fax:  [phone]  Physical Therapy Treatment  Patient Details  Name: James [name] MRN: [id number] Date of Birth: [birth year] Referring Provider: Dorothyann Peng, PA  Encounter Date: 10/18/2016      PT End of Session - 10/18/16 1222    Visit Number 27   Date for PT Re-Evaluation 10/24/16   PT Start Time 6301   PT Stop Time 1222   PT Time Calculation (min) 38 min   Activity Tolerance Patient tolerated treatment well   Behavior During Therapy [facility] for tasks assessed/performed      Past Medical History:  Diagnosis Date  . Arthritis   . Chicken pox   . Hypertension   . Joint pain   . Obesity   . Osteoarthritis   . Prediabetes   . Swelling    feet and legs    Past Surgical History:  Procedure Laterality Date  . cartilage removal    . KNEE SURGERY  2000    There were no vitals filed for this visit.      Subjective Assessment - 10/18/16 1152    Subjective I have had high stress at home.     Pertinent History Pt has wife with disability and he cares for her, Lt hip OA-significant   Patient Stated Goals reduce hip pain, improve LE strength, lose weight   Currently in Pain? Yes   Pain Score 5    Pain Location Hip   Pain Orientation Left   Pain Descriptors / Indicators Aching;Sore   Pain Type Chronic pain   Pain Onset More than a month ago   Pain Frequency Constant   Aggravating Factors  prolonged standing, activity   Pain Relieving Factors medication, stretching, rest   Effect of Pain on Daily Activities walking                         [facility] Adult PT Treatment/Exercise - 10/18/16 0001      Knee/Hip Exercises: Stretches   Active Hamstring Stretch Left;3 reps;20 seconds  on floor seated   Quad Stretch Both;2 reps;10 seconds   Gastroc Stretch Both;2 reps;10 seconds  Slant board     Knee/Hip Exercises:  Aerobic   Nustep Nu-Step L1 8 min arms and legs     Knee/Hip Exercises: Standing   Hip Abduction Right;Left;10 reps;2 sets   Hip Extension Right;Left;10 reps;2 sets     Knee/Hip Exercises: Prone   Hamstring Curl Limitations gluteal squeezes 3x5     Manual Therapy   Passive ROM Manual stretching to Lt hip/ knee- supine and prone. Orange roller to [employer] for tissue mobilization                  PT Short Term Goals - 10/16/16 1230      PT SHORT TERM GOAL #1   Title be independent in initial HEP   Status Achieved     PT SHORT TERM GOAL #2   Title improve LE strength to perform sit to stand with moderate UE support   Status Achieved     PT SHORT TERM GOAL #3   Title initiate cardio exercise in the pool with walking > or = to 2x/wk   Status Achieved     PT SHORT TERM GOAL #4   Title report a 25% reduction in Lt hip pain with standing and walking   Time  4   Period Weeks   Status On-going           PT Long Term Goals - 10/16/16 1231      PT LONG TERM GOAL #1   Title be independent in advanced HEP   Time 6   Period Weeks   Status On-going     PT LONG TERM GOAL #2   Title reduce FOTO to < or = to 55% limitation   Time 6   Period Weeks   Status On-going     PT LONG TERM GOAL #3   Title report a 30% reduction in Lt hip pain with standing and walking   Time 6   Period Weeks   Status On-going     PT LONG TERM GOAL #4   Title improve LE strength to perform sit to stand with minimal to no UE support   Time 6   Period Weeks   Status On-going     PT LONG TERM GOAL #5   Title report compliance with regular strength and cardio exercise for weight loss   Time 6   Period Weeks   Status On-going               Plan - 10/18/16 1155    Clinical Impression Statement Pt able to participate in low level strengthening and ROM exercises.  Pt with Lt hip stiffness that is chronic and pt reports some relief of pain with passive stretching.  Pt will benefit  from 1 more week for strength and flexibility.     Rehab Potential Good   PT Frequency 2x / week   PT Duration 8 weeks   PT Treatment/Interventions ADLs/Self Care Home Management;Cryotherapy;Electrical Stimulation;Iontophoresis 4mg /ml Dexamethasone;Functional mobility training;Stair training;Gait training;Ultrasound;Moist Heat;Therapeutic activities;Therapeutic exercise;Neuromuscular re-education;Patient/family education;Passive range of motion;Manual techniques;Dry needling;Taping   PT Next Visit Plan Hip mobility, strength, weight shifting   Consulted and Agree with Plan of Care Patient      Patient will benefit from skilled therapeutic intervention in order to improve the following deficits and impairments:  Decreased strength, Abnormal gait, Pain, Impaired flexibility, Decreased activity tolerance, Decreased endurance, Difficulty walking, Decreased range of motion  Visit Diagnosis: Pain in left hip  Other abnormalities of gait and mobility  Muscle weakness (generalized)  Stiffness of left hip, not elsewhere classified     Problem List Patient Active Problem List   Diagnosis Date Noted  . At risk for diabetes mellitus 10/02/2016  . Hyperglycemia 09/04/2016  . Severe obesity (BMI >= 40) ([place]) 01/15/2013  . OBESITY, MORBID 01/20/2007  . Essential hypertension 10/31/2006     James [name], PT 10/18/16 12:24 PM  [facility] [address] [place], [place], [address] Phone: [phone]   Fax:  [phone]  Name: James [name] MRN: [id number] Date of Birth: 10-11-1955

## 2016-10-23 ENCOUNTER — Ambulatory Visit: Payer: BLUE CROSS/BLUE SHIELD

## 2016-10-23 DIAGNOSIS — M25552 Pain in left hip: Secondary | ICD-10-CM

## 2016-10-23 DIAGNOSIS — M6281 Muscle weakness (generalized): Secondary | ICD-10-CM

## 2016-10-23 DIAGNOSIS — M25652 Stiffness of left hip, not elsewhere classified: Secondary | ICD-10-CM

## 2016-10-23 DIAGNOSIS — R2689 Other abnormalities of gait and mobility: Secondary | ICD-10-CM

## 2016-10-23 NOTE — Therapy (Signed)
[facility] [address] [place], [place], [address] Phone: [phone]   Fax:  [phone]  Physical Therapy Treatment  Patient Details  Name: James [name] MRN: [id number] Date of Birth: [birth year] Referring Provider: Dorothyann Peng, PA  Encounter Date: 10/23/2016      PT End of Session - 10/23/16 1129    Visit Number 28   PT Start Time 1100  too much pain for exercise, time spent during FOTO   PT Stop Time 1130   PT Time Calculation (min) 30 min   Activity Tolerance Patient limited by pain   Behavior During Therapy [facility] for tasks assessed/performed      Past Medical History:  Diagnosis Date  . Arthritis   . Chicken pox   . Hypertension   . Joint pain   . Obesity   . Osteoarthritis   . Prediabetes   . Swelling    feet and legs    Past Surgical History:  Procedure Laterality Date  . cartilage removal    . KNEE SURGERY  2000    There were no vitals filed for this visit.      Subjective Assessment - 10/23/16 1110    Subjective I am hurting all over today.     Pertinent History Pt has wife with disability and he cares for her, Lt hip OA-significant   Patient Stated Goals reduce hip pain, improve LE strength, lose weight   Currently in Pain? Yes   Pain Score 8    Pain Location Hip   Pain Orientation Left   Pain Descriptors / Indicators Aching;Sore   Pain Type Chronic pain   Pain Onset More than a month ago   Pain Frequency Constant   Aggravating Factors  standing, walking, activity   Pain Relieving Factors medication, stretching, rest, ice            [facility] PT Assessment - 10/23/16 0001      Assessment   Medical Diagnosis Lt hip pain     Cognition   Overall Cognitive Status Within Functional Limits for tasks assessed     Observation/Other Assessments   Focus on Therapeutic Outcomes (FOTO)  73% limitation     AROM   Overall AROM Comments Bil hip flexiblity limited by obesity and  chronic stiffness.  Lt hip limited by pain.                       [facility] PT Treatment/Exercise - 10/23/16 0001      Cryotherapy   Number Minutes Cryotherapy 15 Minutes  during treatment   Cryotherapy Location Hip   Type of Cryotherapy Ice pack     Manual Therapy   Passive ROM Manual stretching to Lt hip/ knee- supine and prone. Orange roller to [employer] for tissue mobilization                  PT Short Term Goals - 10/16/16 1230      PT SHORT TERM GOAL #1   Title be independent in initial HEP   Status Achieved     PT SHORT TERM GOAL #2   Title improve LE strength to perform sit to stand with moderate UE support   Status Achieved     PT SHORT TERM GOAL #3   Title initiate cardio exercise in the pool with walking > or = to 2x/wk   Status Achieved     PT SHORT TERM GOAL #4  Title report a 25% reduction in Lt hip pain with standing and walking   Time 4   Period Weeks   Status On-going           PT Long Term Goals - 10/23/16 1107      PT LONG TERM GOAL #1   Title be independent in advanced HEP   Status Achieved     PT LONG TERM GOAL #2   Title reduce FOTO to < or = to 55% limitation   Baseline 73% limitation   Status Not Met     PT LONG TERM GOAL #3   Title report a 30% reduction in Lt hip pain with standing and walking   Baseline 25% reduction   Status Partially Met     PT LONG TERM GOAL #4   Title improve LE strength to perform sit to stand with minimal to no UE support   Status Partially Met     PT LONG TERM GOAL #5   Title report compliance with regular strength and cardio exercise for weight loss   Baseline 0-1x/wk   Status Partially Met               Plan - 10/23/16 1111    Clinical Impression Statement Pt reports 25% overall improvement since the start of care with increased flexibility and mobility.  Pt with 8/10 pain today so limited ability to exercise today.  FOTO is 73% limitation (76% at evaluation).  Pt  with significant mobility limitations and flexibility limitations due to chronic pain and obesity.  Pt will D/C to HEP today.     PT Next Visit Plan D/C PT to HEP   Consulted and Agree with Plan of Care Patient      Patient will benefit from skilled therapeutic intervention in order to improve the following deficits and impairments:     Visit Diagnosis: Pain in left hip  Other abnormalities of gait and mobility  Muscle weakness (generalized)  Stiffness of left hip, not elsewhere classified     Problem List Patient Active Problem List   Diagnosis Date Noted  . At risk for diabetes mellitus 10/02/2016  . Hyperglycemia 09/04/2016  . Severe obesity (BMI >= 40) ([place]) 01/15/2013  . OBESITY, MORBID 01/20/2007  . Essential hypertension 10/31/2006   PHYSICAL THERAPY DISCHARGE SUMMARY  Visits from Start of Care: 28  Current functional level related to goals / functional outcomes: See above for current status.     Remaining deficits: Significant mobility deficits and limited Lt hip flexibility and strength.  Pt has HEP in place.   Education / Equipment: HEP Plan: Patient agrees to discharge.  Patient goals were partially met. Patient is being discharged due to being pleased with the current functional level.  ?????        Sigurd Sos, PT 10/23/16 11:34 AM  [facility] [address], [place], [place], [address] Phone: [phone]   Fax:  [phone]  Name: ANAN DAPOLITO MRN: [id number] Date of Birth: January 25, 1956

## 2016-11-01 ENCOUNTER — Ambulatory Visit (INDEPENDENT_AMBULATORY_CARE_PROVIDER_SITE_OTHER): Payer: BLUE CROSS/BLUE SHIELD | Admitting: Family Medicine

## 2016-11-01 VITALS — BP 121/83 | HR 79 | Temp 97.7°F | Ht 65.0 in | Wt 284.0 lb

## 2016-11-01 DIAGNOSIS — R7303 Prediabetes: Secondary | ICD-10-CM

## 2016-11-01 DIAGNOSIS — Z6841 Body Mass Index (BMI) 40.0 and over, adult: Secondary | ICD-10-CM

## 2016-11-01 DIAGNOSIS — E669 Obesity, unspecified: Secondary | ICD-10-CM | POA: Diagnosis not present

## 2016-11-01 DIAGNOSIS — E559 Vitamin D deficiency, unspecified: Secondary | ICD-10-CM

## 2016-11-01 DIAGNOSIS — IMO0001 Reserved for inherently not codable concepts without codable children: Secondary | ICD-10-CM

## 2016-11-01 MED ORDER — METFORMIN HCL 500 MG PO TABS: 500.0000 mg | ORAL_TABLET | Freq: Every day | ORAL | 0 refills | Status: DC

## 2016-11-01 NOTE — Progress Notes (Signed)
Office: [phone]  /  Fax: [phone]   HPI:   Chief Complaint: OBESITY James [name] is here to discuss his progress with his obesity treatment plan. He is on the  follow the Category 3 plan and is following his eating plan approximately 95 % of the time. He states he is exercising 0 minutes 0 times per week. James [name] continues to do well with weight loss. Hunger is controlled. He does not feel deprived and is not bored with food options. His weight is 284 lb (128.8 kg) today and has had a weight loss of 4 pounds over a period of 2 weeks since his last visit. He has lost 21 lbs since starting treatment with [name].  Vitamin D deficiency James [name] has a diagnosis of vitamin D deficiency. He is currently taking vit D and denies nausea, vomiting or muscle weakness.  Pre-Diabetes James [name] has a diagnosis of pre-diabetes based on his elevated Hgb A1c and was informed this puts him at greater risk of developing diabetes. He is taking metformin currently and continues to work on diet and exercise to decrease risk of diabetes. He denies nausea, polyphagia or hypoglycemia.   ALLERGIES: No Known Allergies  MEDICATIONS: Current Outpatient Prescriptions on File Prior to Visit  Medication Sig Dispense Refill  . Acetaminophen (TYLENOL PO) Take by mouth.    . cyclobenzaprine (FLEXERIL) 10 MG tablet Take 1 tablet (10 mg total) by mouth 3 (three) times daily as needed for muscle spasms. 30 tablet 0  . hydrochlorothiazide (HYDRODIURIL) 25 MG tablet TAKE 1 TABLET (25 MG TOTAL) BY MOUTH DAILY. 90 tablet 1  . ibuprofen (ADVIL,MOTRIN) 200 MG tablet Take 200 mg by mouth every 6 (six) hours as needed.    Marland Kitchen lisinopril (PRINIVIL,ZESTRIL) 5 MG tablet TAKE 1 TABLET (5 MG TOTAL) BY MOUTH DAILY. 90 tablet 1  . meloxicam (MOBIC) 15 MG tablet Take 1 tablet (15 mg total) by mouth daily as needed for pain. 90 tablet 0  . Multiple Vitamins-Minerals (MULTIVITAMIN WITH MINERALS) tablet Take 1 tablet by mouth daily.    . polyethylene  glycol powder (GLYCOLAX/MIRALAX) powder Take 17 g by mouth daily. 3350 g 0  . Vitamin D, Ergocalciferol, (DRISDOL) 50000 units CAPS capsule Take 1 capsule (50,000 Units total) by mouth every 7 (seven) days. 30 capsule 0   No current facility-administered medications on file prior to visit.     PAST MEDICAL HISTORY: Past Medical History:  Diagnosis Date  . Arthritis   . Chicken pox   . Hypertension   . Joint pain   . Obesity   . Osteoarthritis   . Prediabetes   . Swelling    feet and legs    PAST SURGICAL HISTORY: Past Surgical History:  Procedure Laterality Date  . cartilage removal    . KNEE SURGERY  2000    SOCIAL HISTORY: Social History  Substance Use Topics  . Smoking status: Former Smoker    Quit date: 07/16/1992  . Smokeless tobacco: Never Used  . Alcohol use Yes     Comment: very little per patient     FAMILY HISTORY: Family History  Problem Relation Age of Onset  . Cancer Mother        lung and soft tissue cancer from radiation  . Stroke Mother   . Obesity Mother   . Heart disease Father        CHF  . Diabetes Father   . Hypertension Father   . Obesity Father   . Heart disease Maternal Grandfather   .  Diabetes Paternal Grandmother   . Ovarian cancer Sister     ROS: Review of Systems  Constitutional: Positive for weight loss.  Gastrointestinal: Negative for nausea and vomiting.  Musculoskeletal:       Negative muscle weakness  Endo/Heme/Allergies:       Negative polyphagia Negative hypoglycemia    PHYSICAL EXAM: Blood pressure 121/83, pulse 79, temperature 97.7 F (36.5 C), temperature source Oral, height 5\' 5"  (1.651 m), weight 284 lb (128.8 kg), SpO2 97 %. Body mass index is 47.26 kg/m. Physical Exam  Constitutional: He is oriented to person, place, and time. He appears well-developed and well-nourished.  Cardiovascular: Normal rate.   Pulmonary/Chest: Effort normal.  Musculoskeletal: Normal range of motion.  Neurological: He is  oriented to person, place, and time.  Skin: Skin is warm and dry.  Psychiatric: He has a normal mood and affect. His behavior is normal.  Vitals reviewed.   RECENT LABS AND TESTS: BMET    Component Value Date/Time   NA 140 09/04/2016 1141   K 4.1 09/04/2016 1141   CL 95 (L) 09/04/2016 1141   CO2 28 09/04/2016 1141   GLUCOSE 90 09/04/2016 1141   GLUCOSE 108 (H) 09/13/2015 0910   BUN 17 09/04/2016 1141   CREATININE 0.76 09/04/2016 1141   CALCIUM 9.3 09/04/2016 1141   GFRNONAA 98 09/04/2016 1141   GFRAA 113 09/04/2016 1141   Lab Results  Component Value Date   HGBA1C 5.7 (H) 09/04/2016   HGBA1C 5.9 09/13/2015   HGBA1C 5.8 07/17/2012   Lab Results  Component Value Date   INSULIN 17.2 09/04/2016   CBC    Component Value Date/Time   WBC 5.7 09/04/2016 1141   WBC 6.4 09/13/2015 0910   RBC 4.98 09/04/2016 1141   RBC 4.96 09/13/2015 0910   HGB 14.9 09/04/2016 1141   HCT 44.1 09/04/2016 1141   PLT 192 09/04/2016 1141   MCV 89 09/04/2016 1141   MCH 29.9 09/04/2016 1141   MCHC 33.8 09/04/2016 1141   MCHC 33.6 09/13/2015 0910   RDW 14.6 09/04/2016 1141   LYMPHSABS 1.6 09/04/2016 1141   MONOABS 0.5 09/13/2015 0910   EOSABS 0.0 09/04/2016 1141   BASOSABS 0.0 09/04/2016 1141   Iron/TIBC/Ferritin/ %Sat No results found for: IRON, TIBC, FERRITIN, IRONPCTSAT Lipid Panel     Component Value Date/Time   CHOL 184 09/04/2016 1141   TRIG 84 09/04/2016 1141   HDL 55 09/04/2016 1141   CHOLHDL 3 09/13/2015 0910   VLDL 14.2 09/13/2015 0910   LDLCALC 112 (H) 09/04/2016 1141   Hepatic Function Panel     Component Value Date/Time   PROT 6.8 09/04/2016 1141   ALBUMIN 4.1 09/04/2016 1141   AST 18 09/04/2016 1141   ALT 22 09/04/2016 1141   ALKPHOS 67 09/04/2016 1141   BILITOT 0.5 09/04/2016 1141   BILIDIR 0.1 11/14/2006 0928      Component Value Date/Time   TSH 1.830 09/04/2016 1141   TSH 1.42 11/30/2013 1318   TSH 1.95 11/14/2006 0928    ASSESSMENT AND  PLAN: Prediabetes - Plan: metFORMIN (GLUCOPHAGE) 500 MG tablet  Vitamin D deficiency  Class 3 obesity with serious comorbidity and body mass index (BMI) of 45.0 to 49.9 in adult, unspecified obesity type (James [name])  PLAN:  Vitamin D Deficiency James [name] was informed that low vitamin D levels contributes to fatigue and are associated with obesity, breast, and colon cancer. He agrees to continue to take prescription Vit D @50 ,000 IU every week and will follow  up for routine testing of vitamin D, at least 2-3 times per year. He was informed of the risk of over-replacement of vitamin D and agrees to not increase his dose unless he discusses this with [name] first.  Pre-Diabetes James [name] will continue to work on weight loss, exercise, and decreasing simple carbohydrates in his diet to help decrease the risk of diabetes. We dicussed metformin including benefits and risks. He was informed that eating too many simple carbohydrates or too many calories at one sitting increases the likelihood of GI side effects. James [name] requested metformin for now and a prescription was written today for 1 month refill. James [name] agreed to follow up with [name] as directed to monitor his progress.  Obesity James [name] is currently in the action stage of change. As such, his goal is to continue with weight loss efforts He has agreed to follow the Category 3 plan James [name] has been instructed to work up to a goal of 150 minutes of combined cardio and strengthening exercise per week for weight loss and overall health benefits. We discussed the following Behavioral Modification Strategies today: increasing lean protein intake and meal planning & cooking stratgies  James [name] has agreed to follow up with our clinic in 2 to 3 weeks. He was informed of the importance of frequent follow up visits to maximize his success with intensive lifestyle modifications for his multiple health conditions.  I, James [name], am acting as transcriptionist for James Nip,  MD  I have reviewed the above documentation for accuracy and completeness, and I agree with the above. -James Nip, MD  OBESITY BEHAVIORAL INTERVENTION VISIT  Today's visit was # 5 out of 22.  Starting weight: 305 lbs Starting date: 09/04/16 Today's weight : 284 lbs Today's date: 11/01/2016 Total lbs lost to date: 21 (Patients must lose 7 lbs in the first 6 months to continue with counseling)   ASK: We discussed the diagnosis of obesity with [name] today and James [name] agreed to give [name] permission to discuss obesity behavioral modification therapy today.  ASSESS: Payam has the diagnosis of obesity and his BMI today is 35.4 [place] is in the action stage of change   ADVISE: Arvis was educated on the multiple health risks of obesity as well as the benefit of weight loss to improve his health. He was advised of the need for long term treatment and the importance of lifestyle modifications.  AGREE: Multiple dietary modification options and treatment options were discussed and  Arnet agreed to follow the Category 3 plan We discussed the following Behavioral Modification Stratagies today: increasing lean protein intake and meal planning & cooking strategies

## 2016-11-19 ENCOUNTER — Ambulatory Visit (INDEPENDENT_AMBULATORY_CARE_PROVIDER_SITE_OTHER): Payer: BLUE CROSS/BLUE SHIELD | Admitting: Family Medicine

## 2016-11-19 VITALS — BP 97/64 | HR 88 | Temp 98.5°F | Ht 65.0 in | Wt 280.0 lb

## 2016-11-19 DIAGNOSIS — E669 Obesity, unspecified: Secondary | ICD-10-CM

## 2016-11-19 DIAGNOSIS — R7303 Prediabetes: Secondary | ICD-10-CM | POA: Diagnosis not present

## 2016-11-19 DIAGNOSIS — Z6841 Body Mass Index (BMI) 40.0 and over, adult: Secondary | ICD-10-CM

## 2016-11-19 DIAGNOSIS — IMO0001 Reserved for inherently not codable concepts without codable children: Secondary | ICD-10-CM

## 2016-11-19 NOTE — Progress Notes (Signed)
Office: [phone]  /  Fax: [phone]   HPI:   Chief Complaint: OBESITY James [name] is here to discuss his progress with his obesity treatment plan. He is on the  follow the Category 3 plan and is following his eating plan approximately 98 % of the time. He states he is exercising isometric exercises for 20 minutes 3 times per week. James [name] continues to do well with weight loss on category 3 but is getting tired of dinner options. His mobility is limited and is caring for his sick wife and requests some options which are very easy for dinner. His weight is 280 lb (127 kg) today and has had a weight loss of 4 pounds over a period of 2 to 3 weeks since his last visit. He has lost 25 lbs since starting treatment with [name].  Pre-Diabetes James [name] has a diagnosis of pre-diabetes based on his elevated Hgb A1c and was informed this puts him at greater risk of developing diabetes. His last A1c was at 5.7 on metformin and is doing very well on diet and weight loss. He continues to work on diet and exercise to decrease risk of diabetes. He denies nausea, vomiting or hypoglycemia.   ALLERGIES: No Known Allergies  MEDICATIONS: Current Outpatient Prescriptions on File Prior to Visit  Medication Sig Dispense Refill  . Acetaminophen (TYLENOL PO) Take by mouth.    . cyclobenzaprine (FLEXERIL) 10 MG tablet Take 1 tablet (10 mg total) by mouth 3 (three) times daily as needed for muscle spasms. 30 tablet 0  . hydrochlorothiazide (HYDRODIURIL) 25 MG tablet TAKE 1 TABLET (25 MG TOTAL) BY MOUTH DAILY. 90 tablet 1  . ibuprofen (ADVIL,MOTRIN) 200 MG tablet Take 200 mg by mouth every 6 (six) hours as needed.    Marland Kitchen lisinopril (PRINIVIL,ZESTRIL) 5 MG tablet TAKE 1 TABLET (5 MG TOTAL) BY MOUTH DAILY. 90 tablet 1  . meloxicam (MOBIC) 15 MG tablet Take 1 tablet (15 mg total) by mouth daily as needed for pain. 90 tablet 0  . metFORMIN (GLUCOPHAGE) 500 MG tablet Take 1 tablet (500 mg total) by mouth daily with breakfast. 30  tablet 0  . Multiple Vitamins-Minerals (MULTIVITAMIN WITH MINERALS) tablet Take 1 tablet by mouth daily.    . polyethylene glycol powder (GLYCOLAX/MIRALAX) powder Take 17 g by mouth daily. 3350 g 0  . Vitamin D, Ergocalciferol, (DRISDOL) 50000 units CAPS capsule Take 1 capsule (50,000 Units total) by mouth every 7 (seven) days. 30 capsule 0   No current facility-administered medications on file prior to visit.     PAST MEDICAL HISTORY: Past Medical History:  Diagnosis Date  . Arthritis   . Chicken pox   . Hypertension   . Joint pain   . Obesity   . Osteoarthritis   . Prediabetes   . Swelling    feet and legs    PAST SURGICAL HISTORY: Past Surgical History:  Procedure Laterality Date  . cartilage removal    . KNEE SURGERY  2000    SOCIAL HISTORY: Social History  Substance Use Topics  . Smoking status: Former Smoker    Quit date: 07/16/1992  . Smokeless tobacco: Never Used  . Alcohol use Yes     Comment: very little per patient     FAMILY HISTORY: Family History  Problem Relation Age of Onset  . Cancer Mother        lung and soft tissue cancer from radiation  . Stroke Mother   . Obesity Mother   . Heart disease  Father        CHF  . Diabetes Father   . Hypertension Father   . Obesity Father   . Heart disease Maternal Grandfather   . Diabetes Paternal Grandmother   . Ovarian cancer Sister     ROS: Review of Systems  Constitutional: Positive for weight loss.  Gastrointestinal: Negative for nausea and vomiting.  Endo/Heme/Allergies:       Negative hypoglycemia    PHYSICAL EXAM: Blood pressure 97/64, pulse 88, temperature 98.5 F (36.9 C), temperature source Oral, height 5\' 5"  (1.651 m), weight 280 lb (127 kg), SpO2 95 %. Body mass index is 46.59 kg/m. Physical Exam  Constitutional: He is oriented to person, place, and time. He appears well-developed and well-nourished.  Cardiovascular: Normal rate.   Pulmonary/Chest: Effort normal.  Musculoskeletal:  Normal range of motion.  Neurological: He is oriented to person, place, and time.  Skin: Skin is warm and dry.  Psychiatric: He has a normal mood and affect. His behavior is normal.  Vitals reviewed.   RECENT LABS AND TESTS: BMET    Component Value Date/Time   NA 140 09/04/2016 1141   K 4.1 09/04/2016 1141   CL 95 (L) 09/04/2016 1141   CO2 28 09/04/2016 1141   GLUCOSE 90 09/04/2016 1141   GLUCOSE 108 (H) 09/13/2015 0910   BUN 17 09/04/2016 1141   CREATININE 0.76 09/04/2016 1141   CALCIUM 9.3 09/04/2016 1141   GFRNONAA 98 09/04/2016 1141   GFRAA 113 09/04/2016 1141   Lab Results  Component Value Date   HGBA1C 5.7 (H) 09/04/2016   HGBA1C 5.9 09/13/2015   HGBA1C 5.8 07/17/2012   Lab Results  Component Value Date   INSULIN 17.2 09/04/2016   CBC    Component Value Date/Time   WBC 5.7 09/04/2016 1141   WBC 6.4 09/13/2015 0910   RBC 4.98 09/04/2016 1141   RBC 4.96 09/13/2015 0910   HGB 14.9 09/04/2016 1141   HCT 44.1 09/04/2016 1141   PLT 192 09/04/2016 1141   MCV 89 09/04/2016 1141   MCH 29.9 09/04/2016 1141   MCHC 33.8 09/04/2016 1141   MCHC 33.6 09/13/2015 0910   RDW 14.6 09/04/2016 1141   LYMPHSABS 1.6 09/04/2016 1141   MONOABS 0.5 09/13/2015 0910   EOSABS 0.0 09/04/2016 1141   BASOSABS 0.0 09/04/2016 1141   Iron/TIBC/Ferritin/ %Sat No results found for: IRON, TIBC, FERRITIN, IRONPCTSAT Lipid Panel     Component Value Date/Time   CHOL 184 09/04/2016 1141   TRIG 84 09/04/2016 1141   HDL 55 09/04/2016 1141   CHOLHDL 3 09/13/2015 0910   VLDL 14.2 09/13/2015 0910   LDLCALC 112 (H) 09/04/2016 1141   Hepatic Function Panel     Component Value Date/Time   PROT 6.8 09/04/2016 1141   ALBUMIN 4.1 09/04/2016 1141   AST 18 09/04/2016 1141   ALT 22 09/04/2016 1141   ALKPHOS 67 09/04/2016 1141   BILITOT 0.5 09/04/2016 1141   BILIDIR 0.1 11/14/2006 0928      Component Value Date/Time   TSH 1.830 09/04/2016 1141   TSH 1.42 11/30/2013 1318   TSH 1.95  11/14/2006 0928    ASSESSMENT AND PLAN: Prediabetes  Class 3 obesity with serious comorbidity and body mass index (BMI) of 45.0 to 49.9 in adult, unspecified obesity type [facility])  PLAN:  Pre-Diabetes James [name] will continue to work on weight loss, exercise, and decreasing simple carbohydrates in his diet to help decrease the risk of diabetes. We dicussed metformin including benefits and  risks. He was informed that eating too many simple carbohydrates or too many calories at one sitting increases the likelihood of GI side effects. James [name] agrees to continue metformin for now and a prescription was not written today. James [name] agreed to follow up with [name] as directed to monitor his progress. We will re-check labs in 1 month.  We spent > than 50% of the 15 minute visit on the counseling as documented in the note.  Obesity James [name] is currently in the action stage of change. As such, his goal is to continue with weight loss efforts He has agreed to keep a food journal with 400 to 600 calories and 40+ grams of protein at supper daily and follow the Category 3 plan James [name] has been instructed to work up to a goal of 150 minutes of combined cardio and strengthening exercise per week for weight loss and overall health benefits. We discussed the following Behavioral Modification Strategies today: meal planning & cooking strategies, increasing lean protein intake and decreasing simple carbohydrates   James [name] has agreed to follow up with our clinic in 2 weeks. He was informed of the importance of frequent follow up visits to maximize his success with intensive lifestyle modifications for his multiple health conditions.  I, Doreene Nest, am acting as transcriptionist for Dennard Nip, MD  I have reviewed the above documentation for accuracy and completeness, and I agree with the above. -Dennard Nip, MD   OBESITY BEHAVIORAL INTERVENTION VISIT  Today's visit was # 6 out of 22.  Starting weight: 305 lbs Starting  date: 09/04/16 Today's weight : 280 lbs Today's date: 11/19/2016 Total lbs lost to date: 25 (Patients must lose 7 lbs in the first 6 months to continue with counseling)   ASK: We discussed the diagnosis of obesity with James [name] today and James [name] agreed to give [name] permission to discuss obesity behavioral modification therapy today.  ASSESS: James [name] has the diagnosis of obesity and his BMI today is 46.7 James [name] is in the action stage of change   ADVISE: James [name] was educated on the multiple health risks of obesity as well as the benefit of weight loss to improve his health. He was advised of the need for long term treatment and the importance of lifestyle modifications.  AGREE: Multiple dietary modification options and treatment options were discussed and  James [name] agreed to keep a food journal with 400 to 600 calories and 40+ grams of protein  and follow the Category 3 plan We discussed the following Behavioral Modification Strategies today: meal planning & cooking strategies, increasing lean protein intake and decreasing simple carbohydrates

## 2016-12-04 ENCOUNTER — Ambulatory Visit (INDEPENDENT_AMBULATORY_CARE_PROVIDER_SITE_OTHER): Payer: BLUE CROSS/BLUE SHIELD | Admitting: Family Medicine

## 2016-12-04 VITALS — BP 129/73 | HR 56 | Temp 98.1°F | Ht 65.0 in | Wt 280.0 lb

## 2016-12-04 DIAGNOSIS — E559 Vitamin D deficiency, unspecified: Secondary | ICD-10-CM | POA: Diagnosis not present

## 2016-12-04 DIAGNOSIS — Z6841 Body Mass Index (BMI) 40.0 and over, adult: Secondary | ICD-10-CM

## 2016-12-04 DIAGNOSIS — R7303 Prediabetes: Secondary | ICD-10-CM

## 2016-12-04 DIAGNOSIS — E669 Obesity, unspecified: Secondary | ICD-10-CM

## 2016-12-04 DIAGNOSIS — IMO0001 Reserved for inherently not codable concepts without codable children: Secondary | ICD-10-CM

## 2016-12-04 MED ORDER — METFORMIN HCL 500 MG PO TABS: 500.0000 mg | ORAL_TABLET | Freq: Every day | ORAL | 0 refills | Status: DC

## 2016-12-04 MED ORDER — VITAMIN D (ERGOCALCIFEROL) 1.25 MG (50000 UNIT) PO CAPS: 50000.0000 [IU] | ORAL_CAPSULE | ORAL | 0 refills | Status: DC

## 2016-12-04 NOTE — Progress Notes (Signed)
Office: [phone]  /  Fax: [phone]   HPI:   Chief Complaint: OBESITY [name] is here to discuss his progress with his obesity treatment plan. He is on the  follow the Category 3 plan and is following his eating plan approximately 98 % of the time. He states he is exercising [employer]ometric[employer] for 10 to 20 minutes 3 to 4 times per week. [name]  Has done well with maintaining his weight. He has had extra challenges with food prep and caring for his disabled wife. He is getting bored with dinner and asks for advice for additional dinner options. His weight is 280 lb (127 kg) today and has maintained weight over a period of 2 weeks since his last visit. He has lost 25 lbs since starting treatment with [name].  Vitamin D deficiency [name] has a diagnosis of vitamin D deficiency. He is currently stable on vit D and denies nausea, vomiting or muscle weakness.  Pre-Diabetes [name] has a diagnosis of pre-diabetes based on his elevated Hgb A1c and was informed this puts him at greater risk of developing diabetes. He is taking metformin in the morning and has very occasional GI upset but is tolerating metformin well overall. He continues to work on diet and exercise to decrease risk of diabetes. He denies hypoglycemia.   ALLERGIES: No Known Allergies  MEDICATIONS: Current Outpatient Prescriptions on File Prior to Visit  Medication Sig Dispense Refill  . Acetaminophen (TYLENOL PO) Take by mouth.    . cyclobenzaprine (FLEXERIL) 10 MG tablet Take 1 tablet (10 mg total) by mouth 3 (three) times daily as needed for muscle spasms. 30 tablet 0  . hydrochlorothiazide (HYDRODIURIL) 25 MG tablet TAKE 1 TABLET (25 MG TOTAL) BY MOUTH DAILY. 90 tablet 1  . ibuprofen (ADVIL,MOTRIN) 200 MG tablet Take 200 mg by mouth every 6 (six) hours as needed.    Marland Kitchen lisinopril (PRINIVIL,ZESTRIL) 5 MG tablet TAKE 1 TABLET (5 MG TOTAL) BY MOUTH DAILY. 90 tablet 1  . meloxicam (MOBIC) 15 MG tablet Take 1 tablet (15 mg total) by mouth  daily as needed for pain. 90 tablet 0  . Multiple Vitamins-Minerals (MULTIVITAMIN WITH MINERALS) tablet Take 1 tablet by mouth daily.    . polyethylene glycol powder (GLYCOLAX/MIRALAX) powder Take 17 g by mouth daily. 3350 g 0   No current facility-administered medications on file prior to visit.     PAST MEDICAL HISTORY: Past Medical History:  Diagnosis Date  . Arthritis   . Chicken pox   . Hypertension   . Joint pain   . Obesity   . Osteoarthritis   . Prediabetes   . Swelling    feet and legs    PAST SURGICAL HISTORY: Past Surgical History:  Procedure Laterality Date  . cartilage removal    . KNEE SURGERY  2000    SOCIAL HISTORY: Social History  Substance Use Topics  . Smoking status: Former Smoker    Quit date: 07/16/1992  . Smokeless tobacco: Never Used  . Alcohol use Yes     Comment: very little per patient     FAMILY HISTORY: Family History  Problem Relation Age of Onset  . Cancer Mother        lung and soft tissue cancer from radiation  . Stroke Mother   . Obesity Mother   . Heart disease Father        CHF  . Diabetes Father   . Hypertension Father   . Obesity Father   . Heart disease  Maternal Grandfather   . Diabetes Paternal Grandmother   . Ovarian cancer Sister     ROS: Review of Systems  Constitutional: Negative for weight loss.  Gastrointestinal: Positive for diarrhea and nausea. Negative for vomiting.  Musculoskeletal:       Negative muscle weakness  Endo/Heme/Allergies:       Negative hypoglycemia    PHYSICAL EXAM: Blood pressure 129/73, pulse (!) 56, temperature 98.1 F (36.7 C), temperature source Oral, height 5\' 5"  (1.651 m), weight 280 lb (127 kg), SpO2 97 %. Body mass index is 46.59 kg/m. Physical Exam  Constitutional: He is oriented to person, place, and time. He appears well-developed and well-nourished.  Cardiovascular: Normal rate.   Pulmonary/Chest: Effort normal.  Musculoskeletal: Normal range of motion.  Neurological:  He is oriented to person, place, and time.  Skin: Skin is warm and dry.  Psychiatric: He has a normal mood and affect. His behavior is normal.  Vitals reviewed.   RECENT LABS AND TESTS: BMET    Component Value Date/Time   NA 140 09/04/2016 1141   K 4.1 09/04/2016 1141   CL 95 (L) 09/04/2016 1141   CO2 28 09/04/2016 1141   GLUCOSE 90 09/04/2016 1141   GLUCOSE 108 (H) 09/13/2015 0910   BUN 17 09/04/2016 1141   CREATININE 0.76 09/04/2016 1141   CALCIUM 9.3 09/04/2016 1141   GFRNONAA 98 09/04/2016 1141   GFRAA 113 09/04/2016 1141   Lab Results  Component Value Date   HGBA1C 5.7 (H) 09/04/2016   HGBA1C 5.9 09/13/2015   HGBA1C 5.8 07/17/2012   Lab Results  Component Value Date   INSULIN 17.2 09/04/2016   CBC    Component Value Date/Time   WBC 5.7 09/04/2016 1141   WBC 6.4 09/13/2015 0910   RBC 4.98 09/04/2016 1141   RBC 4.96 09/13/2015 0910   HGB 14.9 09/04/2016 1141   HCT 44.1 09/04/2016 1141   PLT 192 09/04/2016 1141   MCV 89 09/04/2016 1141   MCH 29.9 09/04/2016 1141   MCHC 33.8 09/04/2016 1141   MCHC 33.6 09/13/2015 0910   RDW 14.6 09/04/2016 1141   LYMPHSABS 1.6 09/04/2016 1141   MONOABS 0.5 09/13/2015 0910   EOSABS 0.0 09/04/2016 1141   BASOSABS 0.0 09/04/2016 1141   Iron/TIBC/Ferritin/ %Sat No results found for: IRON, TIBC, FERRITIN, IRONPCTSAT Lipid Panel     Component Value Date/Time   CHOL 184 09/04/2016 1141   TRIG 84 09/04/2016 1141   HDL 55 09/04/2016 1141   CHOLHDL 3 09/13/2015 0910   VLDL 14.2 09/13/2015 0910   LDLCALC 112 (H) 09/04/2016 1141   Hepatic Function Panel     Component Value Date/Time   PROT 6.8 09/04/2016 1141   ALBUMIN 4.1 09/04/2016 1141   AST 18 09/04/2016 1141   ALT 22 09/04/2016 1141   ALKPHOS 67 09/04/2016 1141   BILITOT 0.5 09/04/2016 1141   BILIDIR 0.1 11/14/2006 0928      Component Value Date/Time   TSH 1.830 09/04/2016 1141   TSH 1.42 11/30/2013 1318   TSH 1.95 11/14/2006 0928    ASSESSMENT AND  PLAN: Vitamin D deficiency - Plan: Vitamin D, Ergocalciferol, (DRISDOL) 50000 units CAPS capsule  Prediabetes - Plan: metFORMIN (GLUCOPHAGE) 500 MG tablet  Class 3 obesity with serious comorbidity and body mass index (BMI) of 45.0 to 49.9 in adult, unspecified obesity type ([place])  PLAN:  Vitamin D Deficiency Jaiquan was informed that low vitamin D levels contributes to fatigue and are associated with obesity, breast, and colon  cancer. He agrees to continue to take prescription Vit D @50 ,000 IU every week, we will refill for 1 month and will follow up for routine testing of vitamin D, at least 2-3 times per year. He was informed of the risk of over-replacement of vitamin D and agrees to not increase his dose unless he discusses this with [name] first. Quindell agrees to follow up with our clinic in 2 weeks.  Pre-Diabetes Kaiven will continue to work on weight loss, exercise, and decreasing simple carbohydrates in his diet to help decrease the risk of diabetes. We dicussed metformin including benefits and risks. He was informed that eating too many simple carbohydrates or too many calories at one sitting increases the likelihood of GI side effects. [name] requested metformin for now and a prescription was written today for 1 month refill. Knute agreed to follow up with [name] as directed to monitor his progress.  Obesity Addiel is currently in the action stage of change. As such, his goal is to continue with weight loss efforts He has agreed to keep a food journal with 400 to 600 calories and 40 grams of protein at supper daily and follow the Category 3 plan Carlon has been instructed to work up to a goal of 150 minutes of combined cardio and strengthening exercise per week for weight loss and overall health benefits. We discussed the following Behavioral Modification Strategies today: meal planning & cooking strategies, increasing lean protein intake and dealing with family or coworker sabotage  Kordae has agreed  to follow up with our clinic in 2 weeks. He was informed of the importance of frequent follow up visits to maximize his success with intensive lifestyle modifications for his multiple health conditions.  I, Doreene Nest, am acting as transcriptionist for Dennard Nip, MD  I have reviewed the above documentation for accuracy and completeness, and I agree with the above. -Dennard Nip, MD   OBESITY BEHAVIORAL INTERVENTION VISIT  Today's visit was # 7 out of 22.  Starting weight: 305 lbs Starting date: 09/04/16 Today's weight : 280 lbs Today's date: 12/04/2016 Total lbs lost to date: 25 (Patients must lose 7 lbs in the first 6 months to continue with counseling)   ASK: We discussed the diagnosis of obesity with [name] today and [name] agreed to give [name] permission to discuss obesity behavioral modification therapy today.  ASSESS: Salim has the diagnosis of obesity and his BMI today is 46.7 Kalem is in the action stage of change   ADVISE: Kinta was educated on the multiple health risks of obesity as well as the benefit of weight loss to improve his health. He was advised of the need for long term treatment and the importance of lifestyle modifications.  AGREE: Multiple dietary modification options and treatment options were discussed and  Elison agreed to keep a food journal with 400 to 600 calories and 40 grams of protein at supper and follow the Category 3 plan We discussed the following Behavioral Modification Strategies today: meal planning & cooking strategies, increasing lean protein intake and dealing with family or coworker sabotage

## 2016-12-17 ENCOUNTER — Ambulatory Visit (INDEPENDENT_AMBULATORY_CARE_PROVIDER_SITE_OTHER): Payer: BLUE CROSS/BLUE SHIELD | Admitting: Family Medicine

## 2016-12-17 VITALS — BP 133/81 | HR 71 | Temp 97.8°F | Ht 65.0 in | Wt 276.0 lb

## 2016-12-17 DIAGNOSIS — R7303 Prediabetes: Secondary | ICD-10-CM | POA: Diagnosis not present

## 2016-12-17 DIAGNOSIS — Z6841 Body Mass Index (BMI) 40.0 and over, adult: Secondary | ICD-10-CM

## 2016-12-17 DIAGNOSIS — E669 Obesity, unspecified: Secondary | ICD-10-CM | POA: Diagnosis not present

## 2016-12-17 DIAGNOSIS — IMO0001 Reserved for inherently not codable concepts without codable children: Secondary | ICD-10-CM

## 2016-12-17 MED ORDER — METFORMIN HCL 500 MG PO TABS: 500.0000 mg | ORAL_TABLET | Freq: Every day | ORAL | 0 refills | Status: DC

## 2016-12-17 NOTE — Progress Notes (Signed)
Office: [phone]  /  Fax: [phone]   HPI:   Chief Complaint: OBESITY James [name] is here to discuss his progress with his obesity treatment plan. He is on the keep a food journal with 400 to 600 calories and 40 grams of protein  and follow the Category 3 plan and is following his eating plan approximately 98 % of the time. He states he is walking 40 minutes 1 time per week. Danner continues to do well with weight loss and journaling. 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 He is doing well with walking for exercise. His weight is 276 lb (125.2 kg) today and has had a weight loss of 4 pounds over a period of 2 weeks since his last visit. He has lost 29 lbs since starting treatment with [name].  Pre-Diabetes Jamaar has a diagnosis of pre-diabetes based on his elevated Hgb A1c and was informed this puts him at greater risk of developing diabetes. He is doing well on metformin currently and continues to work on diet and exercise to decrease risk of diabetes. He denies nausea or hypoglycemia.   ALLERGIES: No Known Allergies  MEDICATIONS: Current Outpatient Prescriptions on File Prior to Visit  Medication Sig Dispense Refill  . Acetaminophen (TYLENOL PO) Take by mouth.    . cyclobenzaprine (FLEXERIL) 10 MG tablet Take 1 tablet (10 mg total) by mouth 3 (three) times daily as needed for muscle spasms. 30 tablet 0  . hydrochlorothiazide (HYDRODIURIL) 25 MG tablet TAKE 1 TABLET (25 MG TOTAL) BY MOUTH DAILY. 90 tablet 1  . ibuprofen (ADVIL,MOTRIN) 200 MG tablet Take 200 mg by mouth every 6 (six) hours as needed.    Marland Kitchen lisinopril (PRINIVIL,ZESTRIL) 5 MG tablet TAKE 1 TABLET (5 MG TOTAL) BY MOUTH DAILY. 90 tablet 1  . meloxicam (MOBIC) 15 MG tablet Take 1 tablet (15 mg total) by mouth daily as needed for pain. 90 tablet 0  . metFORMIN (GLUCOPHAGE) 500 MG tablet Take 1 tablet (500 mg total) by mouth daily with breakfast. 30 tablet 0  . Multiple Vitamins-Minerals (MULTIVITAMIN  WITH MINERALS) tablet Take 1 tablet by mouth daily.    . polyethylene glycol powder (GLYCOLAX/MIRALAX) powder Take 17 g by mouth daily. 3350 g 0  . Vitamin D, Ergocalciferol, (DRISDOL) 50000 units CAPS capsule Take 1 capsule (50,000 Units total) by mouth every 7 (seven) days. 30 capsule 0   No current facility-administered medications on file prior to visit.     PAST MEDICAL HISTORY: Past Medical History:  Diagnosis Date  . Arthritis   . Chicken pox   . Hypertension   . Joint pain   . Obesity   . Osteoarthritis   . Prediabetes   . Swelling    feet and legs    PAST SURGICAL HISTORY: Past Surgical History:  Procedure Laterality Date  . cartilage removal    . KNEE SURGERY  2000    SOCIAL HISTORY: Social History  Substance Use Topics  . Smoking status: Former Smoker    Quit date: 07/16/1992  . Smokeless tobacco: Never Used  . Alcohol use Yes     Comment: very little per patient     FAMILY HISTORY: Family History  Problem Relation Age of Onset  . Cancer Mother        lung and soft tissue cancer from radiation  . Stroke Mother   . Obesity Mother   . Heart disease Father        CHF  . Diabetes  Father   . Hypertension Father   . Obesity Father   . Heart disease Maternal Grandfather   . Diabetes Paternal Grandmother   . Ovarian cancer Sister     ROS: Review of Systems  Constitutional: Positive for weight loss.  Gastrointestinal: Negative for nausea.  Endo/Heme/Allergies:       Negative hypoglycemia    PHYSICAL EXAM: Blood pressure 133/81, pulse 71, temperature 97.8 F (36.6 C), temperature source Oral, height 5\' 5"  (1.651 m), weight 276 lb (125.2 kg), SpO2 97 %. Body mass index is 45.93 kg/m. Physical Exam  Constitutional: He is oriented to person, place, and time. He appears well-developed and well-nourished.  Cardiovascular: Normal rate.   Pulmonary/Chest: Effort normal.  Musculoskeletal: Normal range of motion.  Neurological: He is oriented to person,  place, and time.  Skin: Skin is warm and dry.  Psychiatric: He has a normal mood and affect. His behavior is normal.  Vitals reviewed.   RECENT LABS AND TESTS: BMET    Component Value Date/Time   NA 140 09/04/2016 1141   K 4.1 09/04/2016 1141   CL 95 (L) 09/04/2016 1141   CO2 28 09/04/2016 1141   GLUCOSE 90 09/04/2016 1141   GLUCOSE 108 (H) 09/13/2015 0910   BUN 17 09/04/2016 1141   CREATININE 0.76 09/04/2016 1141   CALCIUM 9.3 09/04/2016 1141   GFRNONAA 98 09/04/2016 1141   GFRAA 113 09/04/2016 1141   Lab Results  Component Value Date   HGBA1C 5.7 (H) 09/04/2016   HGBA1C 5.9 09/13/2015   HGBA1C 5.8 07/17/2012   Lab Results  Component Value Date   INSULIN 17.2 09/04/2016   CBC    Component Value Date/Time   WBC 5.7 09/04/2016 1141   WBC 6.4 09/13/2015 0910   RBC 4.98 09/04/2016 1141   RBC 4.96 09/13/2015 0910   HGB 14.9 09/04/2016 1141   HCT 44.1 09/04/2016 1141   PLT 192 09/04/2016 1141   MCV 89 09/04/2016 1141   MCH 29.9 09/04/2016 1141   MCHC 33.8 09/04/2016 1141   MCHC 33.6 09/13/2015 0910   RDW 14.6 09/04/2016 1141   LYMPHSABS 1.6 09/04/2016 1141   MONOABS 0.5 09/13/2015 0910   EOSABS 0.0 09/04/2016 1141   BASOSABS 0.0 09/04/2016 1141   Iron/TIBC/Ferritin/ %Sat No results found for: IRON, TIBC, FERRITIN, IRONPCTSAT Lipid Panel     Component Value Date/Time   CHOL 184 09/04/2016 1141   TRIG 84 09/04/2016 1141   HDL 55 09/04/2016 1141   CHOLHDL 3 09/13/2015 0910   VLDL 14.2 09/13/2015 0910   LDLCALC 112 (H) 09/04/2016 1141   Hepatic Function Panel     Component Value Date/Time   PROT 6.8 09/04/2016 1141   ALBUMIN 4.1 09/04/2016 1141   AST 18 09/04/2016 1141   ALT 22 09/04/2016 1141   ALKPHOS 67 09/04/2016 1141   BILITOT 0.5 09/04/2016 1141   BILIDIR 0.1 11/14/2006 0928      Component Value Date/Time   TSH 1.830 09/04/2016 1141   TSH 1.42 11/30/2013 1318   TSH 1.95 11/14/2006 0928    ASSESSMENT AND PLAN: Prediabetes - Plan:  metFORMIN (GLUCOPHAGE) 500 MG tablet  Class 3 obesity with serious comorbidity and body mass index (BMI) of 45.0 to 49.9 in adult, unspecified obesity type [facility])  PLAN:  Pre-Diabetes Gabryel will continue to work on weight loss, exercise, and decreasing simple carbohydrates in his diet to help decrease the risk of diabetes. We dicussed metformin including benefits and risks. He was informed that eating too  many simple carbohydrates or too many calories at one sitting increases the likelihood of GI side effects. Angela agrees to continue metformin for now and a prescription was written today for 1 month refill. We will re-check labs at the next visit and Elijio agreed to follow up with [name] as directed to monitor his progress.  Obesity Chancellor is currently in the action stage of change. As such, his goal is to continue with weight loss efforts He has agreed to keep a food journal with 400 to 600 calories and 40 grams of protein at supper daily and follow the Category 3 plan Dianna has been instructed to work up to a goal of 150 minutes of combined cardio and strengthening exercise per week for weight loss and overall health benefits. We discussed the following Behavioral Modification Strategies today: increasing lean protein intake and decreasing simple carbohydrates   Allante has agreed to follow up with our clinic in 2 to 3 weeks fasting. He was informed of the importance of frequent follow up visits to maximize his success with intensive lifestyle modifications for his multiple health conditions.  I, Doreene Nest, am acting as transcriptionist for Dennard Nip, MD  I have reviewed the above documentation for accuracy and completeness, and I agree with the above. -Dennard Nip, MD   OBESITY BEHAVIORAL INTERVENTION VISIT  Today's visit was # 8 out of 22.  Starting weight: 305 lbs Starting date: 09/04/16 Today's weight : 276 lbs Today's date: 12/17/2016 Total lbs lost to date: 29 (Patients must  lose 7 lbs in the first 6 months to continue with counseling)   ASK: We discussed the diagnosis of obesity with [name] today and [name] agreed to give [name] permission to discuss obesity behavioral modification therapy today.  ASSESS: [place] has the diagnosis of obesity and his BMI today is 11 Trae is in the action stage of change   ADVISE: Zyon was educated on the multiple health risks of obesity as well as the benefit of weight loss to improve his health. He was advised of the need for long term treatment and the importance of lifestyle modifications.  AGREE: Multiple dietary modification options and treatment options were discussed and  Jevon agreed to keep a food journal with 400 to 600 calories and 40 grams of protein at supper daily and follow the Category 3 plan We discussed the following Behavioral Modification Strategies today: increasing lean protein intake and decreasing simple carbohydrates

## 2017-01-01 ENCOUNTER — Ambulatory Visit (INDEPENDENT_AMBULATORY_CARE_PROVIDER_SITE_OTHER): Payer: BLUE CROSS/BLUE SHIELD | Admitting: Family Medicine

## 2017-01-01 VITALS — BP 143/75 | HR 80 | Temp 98.8°F | Ht 65.0 in | Wt 274.0 lb

## 2017-01-01 DIAGNOSIS — Z6841 Body Mass Index (BMI) 40.0 and over, adult: Secondary | ICD-10-CM | POA: Diagnosis not present

## 2017-01-01 DIAGNOSIS — I10 Essential (primary) hypertension: Secondary | ICD-10-CM | POA: Diagnosis not present

## 2017-01-01 DIAGNOSIS — E669 Obesity, unspecified: Secondary | ICD-10-CM | POA: Diagnosis not present

## 2017-01-01 DIAGNOSIS — R7303 Prediabetes: Secondary | ICD-10-CM

## 2017-01-01 DIAGNOSIS — E7849 Other hyperlipidemia: Secondary | ICD-10-CM

## 2017-01-01 DIAGNOSIS — Z0289 Encounter for other administrative examinations: Secondary | ICD-10-CM

## 2017-01-01 DIAGNOSIS — E784 Other hyperlipidemia: Secondary | ICD-10-CM | POA: Diagnosis not present

## 2017-01-01 DIAGNOSIS — IMO0001 Reserved for inherently not codable concepts without codable children: Secondary | ICD-10-CM

## 2017-01-01 DIAGNOSIS — E559 Vitamin D deficiency, unspecified: Secondary | ICD-10-CM

## 2017-01-01 NOTE — Progress Notes (Signed)
Office: [phone]  /  Fax: [phone]   HPI:   Chief Complaint: OBESITY James [name] is here to discuss his progress with his obesity treatment plan. He is on the keep a food journal with 400 to 600 calories and 40 grams of protein at supper daily and the Category 3 plan and is following his eating plan approximately 98 % of the time. He states he is staying active and stretching for 150 minutes 1 time per week. James [name] continues to do very well with weight loss. Hunger is controlled. He is journaling for dinner as requested and is journaling his other meals as well. His weight is 274 lb (124.3 kg) today and has had a weight loss of 2 pounds over a period of 2 weeks since his last visit. He has lost 31 lbs since starting treatment with [name].  Vitamin D deficiency James [name] has a diagnosis of vitamin D deficiency. He is currently stable on vit D, fatigue is improving and he denies nausea, vomiting or muscle weakness.  Pre-Diabetes James [name] has a diagnosis of pre-diabetes based on his elevated Hgb A1c and was informed this puts him at greater risk of developing diabetes. He is stable on diet and metformin and he continues to work on diet and exercise to decrease risk of diabetes. He has decreased polyphagia and denies nausea or hypoglycemia.  Hypertension James [name] is a 61 y.o. male with hypertension. His blood pressure is elevated today. James [name] didn't take his medications as he is here fasting to have labs. James [name] denies chest pain or shortness of breath on exertion. He is working weight loss to help control his blood pressure with the goal of decreasing his risk of heart attack and stroke. [name] blood pressure is not currently controlled.  Hyperlipidemia James [name] has hyperlipidemia and is attempting to improve his cholesterol levels with intensive lifestyle modification including a low saturated fat diet, exercise and weight loss. He denies any chest pain, claudication or myalgias. James [name] is  due for labs.    ALLERGIES: No Known Allergies  MEDICATIONS: Current Outpatient Prescriptions on File Prior to Visit  Medication Sig Dispense Refill   Acetaminophen (TYLENOL PO) Take by mouth.     cyclobenzaprine (FLEXERIL) 10 MG tablet Take 1 tablet (10 mg total) by mouth 3 (three) times daily as needed for muscle spasms. 30 tablet 0   hydrochlorothiazide (HYDRODIURIL) 25 MG tablet TAKE 1 TABLET (25 MG TOTAL) BY MOUTH DAILY. 90 tablet 1   ibuprofen (ADVIL,MOTRIN) 200 MG tablet Take 200 mg by mouth every 6 (six) hours as needed.     lisinopril (PRINIVIL,ZESTRIL) 5 MG tablet TAKE 1 TABLET (5 MG TOTAL) BY MOUTH DAILY. 90 tablet 1   meloxicam (MOBIC) 15 MG tablet Take 1 tablet (15 mg total) by mouth daily as needed for pain. 90 tablet 0   metFORMIN (GLUCOPHAGE) 500 MG tablet Take 1 tablet (500 mg total) by mouth daily with breakfast. 30 tablet 0   Multiple Vitamins-Minerals (MULTIVITAMIN WITH MINERALS) tablet Take 1 tablet by mouth daily.     polyethylene glycol powder (GLYCOLAX/MIRALAX) powder Take 17 g by mouth daily. 3350 g 0   Vitamin D, Ergocalciferol, (DRISDOL) 50000 units CAPS capsule Take 1 capsule (50,000 Units total) by mouth every 7 (seven) days. 30 capsule 0   No current facility-administered medications on file prior to visit.     PAST MEDICAL HISTORY: Past Medical History:  Diagnosis Date   Arthritis    Chicken pox    Hypertension  Joint pain    Obesity    Osteoarthritis    Prediabetes    Swelling    feet and legs    PAST SURGICAL HISTORY: Past Surgical History:  Procedure Laterality Date   cartilage removal     KNEE SURGERY  2000    SOCIAL HISTORY: Social History  Substance Use Topics   Smoking status: Former Smoker    Quit date: 07/16/1992   Smokeless tobacco: Never Used   Alcohol use Yes     Comment: very little per patient     FAMILY HISTORY: Family History  Problem Relation Age of Onset   Cancer Mother        lung and  soft tissue cancer from radiation   Stroke Mother    Obesity Mother    Heart disease Father        CHF   Diabetes Father    Hypertension Father    Obesity Father    Heart disease Maternal Grandfather    Diabetes Paternal Grandmother    Ovarian cancer Sister     ROS: Review of Systems  Constitutional: Positive for malaise/fatigue and weight loss.  Respiratory: Negative for shortness of breath (on exertion).   Cardiovascular: Negative for chest pain.  Gastrointestinal: Negative for nausea and vomiting.  Musculoskeletal:       Negative muscle weakness  Endo/Heme/Allergies:       Polyphagia Negative hypoglycemia    PHYSICAL EXAM: Blood pressure (!) 143/75, pulse 80, temperature 98.8 F (37.1 C), temperature source Oral, height 5\' 5"  (1.651 m), weight 274 lb (124.3 kg), SpO2 94 %. Body mass index is 45.6 kg/m. Physical Exam  Constitutional: He is oriented to person, place, and time. He appears well-developed and well-nourished.  Cardiovascular: Normal rate.   Pulmonary/Chest: Effort normal.  Musculoskeletal: Normal range of motion.  Neurological: He is oriented to person, place, and time.  Skin: Skin is warm and dry.  Psychiatric: He has a normal mood and affect. His behavior is normal.  Vitals reviewed.   RECENT LABS AND TESTS: BMET    Component Value Date/Time   NA 140 09/04/2016 1141   K 4.1 09/04/2016 1141   CL 95 (L) 09/04/2016 1141   CO2 28 09/04/2016 1141   GLUCOSE 90 09/04/2016 1141   GLUCOSE 108 (H) 09/13/2015 0910   BUN 17 09/04/2016 1141   CREATININE 0.76 09/04/2016 1141   CALCIUM 9.3 09/04/2016 1141   GFRNONAA 98 09/04/2016 1141   GFRAA 113 09/04/2016 1141   Lab Results  Component Value Date   HGBA1C 5.7 (H) 09/04/2016   HGBA1C 5.9 09/13/2015   HGBA1C 5.8 07/17/2012   Lab Results  Component Value Date   INSULIN 17.2 09/04/2016   CBC    Component Value Date/Time   WBC 5.7 09/04/2016 1141   WBC 6.4 09/13/2015 0910   RBC 4.98  09/04/2016 1141   RBC 4.96 09/13/2015 0910   HGB 14.9 09/04/2016 1141   HCT 44.1 09/04/2016 1141   PLT 192 09/04/2016 1141   MCV 89 09/04/2016 1141   MCH 29.9 09/04/2016 1141   MCHC 33.8 09/04/2016 1141   MCHC 33.6 09/13/2015 0910   RDW 14.6 09/04/2016 1141   LYMPHSABS 1.6 09/04/2016 1141   MONOABS 0.5 09/13/2015 0910   EOSABS 0.0 09/04/2016 1141   BASOSABS 0.0 09/04/2016 1141   Iron/TIBC/Ferritin/ %Sat No results found for: IRON, TIBC, FERRITIN, IRONPCTSAT Lipid Panel     Component Value Date/Time   CHOL 184 09/04/2016 1141   TRIG 84  09/04/2016 1141   HDL 55 09/04/2016 1141   CHOLHDL 3 09/13/2015 0910   VLDL 14.2 09/13/2015 0910   LDLCALC 112 (H) 09/04/2016 1141   Hepatic Function Panel     Component Value Date/Time   PROT 6.8 09/04/2016 1141   ALBUMIN 4.1 09/04/2016 1141   AST 18 09/04/2016 1141   ALT 22 09/04/2016 1141   ALKPHOS 67 09/04/2016 1141   BILITOT 0.5 09/04/2016 1141   BILIDIR 0.1 11/14/2006 0928      Component Value Date/Time   TSH 1.830 09/04/2016 1141   TSH 1.42 11/30/2013 1318   TSH 1.95 11/14/2006 0928    ASSESSMENT AND PLAN: Essential hypertension  Prediabetes - Plan: Comprehensive metabolic panel, Hemoglobin A1c, Insulin, random  Vitamin D deficiency - Plan: VITAMIN D 25 Hydroxy (Vit-D Deficiency, Fractures)  Other hyperlipidemia - Plan: Lipid Panel With LDL/HDL Ratio  Class 3 obesity with serious comorbidity and body mass index (BMI) of 45.0 to 49.9 in adult, unspecified obesity type ([place])  PLAN:  Vitamin D Deficiency Adin was informed that low vitamin D levels contributes to fatigue and are associated with obesity, breast, and colon cancer. He agrees to continue to take prescription Vit D @50 ,000 IU every week and we will check labs and will follow up for routine testing of vitamin D, at least 2-3 times per year. He was informed of the risk of over-replacement of vitamin D and agrees to not increase his dose unless he discusses this  with [name] first. Luther agrees to follow up with our clinic in 2 weeks.  Pre-Diabetes Zeric will continue to work on weight loss, exercise, and decreasing simple carbohydrates in his diet to help decrease the risk of diabetes. We dicussed metformin including benefits and risks. He was informed that eating too many simple carbohydrates or too many calories at one sitting increases the likelihood of GI side effects. We will check labs and Ival agrees to continue metformin for now and a prescription was not written today. Rashaan agreed to follow up with [name] as directed to monitor his progress.  Hypertension We discussed sodium restriction, working on healthy weight loss, and a regular exercise program as the means to achieve improved blood pressure control. James [name] agreed with this plan and agreed to follow up as directed. We will check labs and continue to monitor his blood pressure as well as his progress with the above lifestyle modifications. He will continue his medications as prescribed and will watch for signs of hypotension as he continues his lifestyle modifications.   Hyperlipidemia Tanish was informed of the American Heart Association Guidelines emphasizing intensive lifestyle modifications as the first line treatment for hyperlipidemia. We discussed many lifestyle modifications today in depth, and Rudolph will continue to work on decreasing saturated fats such as fatty red meat, butter and many fried foods. He will also increase vegetables and lean protein in his diet and continue to work on exercise and weight loss efforts. We will check labs and [place] agrees to follow up at the agreed upon time.  Obesity Hamdan is currently in the action stage of change. As such, his goal is to continue with weight loss efforts He has agreed to change to keep a food journal with 1400 to 1700 calories and 90+ grams of protein daily Semaj has been instructed to work up to a goal of 150 minutes of combined cardio and  strengthening exercise per week for weight loss and overall health benefits. We discussed the following Behavioral Modification Strategies today:  planning for success, increasing lean protein intake, decreasing simple carbohydrates  and dealing with family or coworker sabotage  Jacqueline has agreed to follow up with our clinic in 2 weeks. He was informed of the importance of frequent follow up visits to maximize his success with intensive lifestyle modifications for his multiple health conditions.  I, Doreene Nest, am acting as transcriptionist for Dennard Nip, MD  I have reviewed the above documentation for accuracy and completeness, and I agree with the above. -Dennard Nip, MD   OBESITY BEHAVIORAL INTERVENTION VISIT  Today's visit was # 9 out of 22.  Starting weight: 305 lbs Starting date: 09/04/16 Today's weight : 274 lbs  Today's date: 01/01/2017 Total lbs lost to date: 5 (Patients must lose 7 lbs in the first 6 months to continue with counseling)   ASK: We discussed the diagnosis of obesity with James [name] today and James [name] agreed to give [name] permission to discuss obesity behavioral modification therapy today.  ASSESS: Joson has the diagnosis of obesity and his BMI today is 45.6 Karthikeya is in the action stage of change   ADVISE: Fritz was educated on the multiple health risks of obesity as well as the benefit of weight loss to improve his health. He was advised of the need for long term treatment and the importance of lifestyle modifications.  AGREE: Multiple dietary modification options and treatment options were discussed and  Creg agreed to change to keep a food journal with 1400 to 1700 calories and 90+ grams of protein  We discussed the following Behavioral Modification Strategies today: planning for success, increasing lean protein intake, decreasing simple carbohydrates  and dealing with family or coworker sabotage

## 2017-01-02 LAB — LIPID PANEL WITH LDL/HDL RATIO
Cholesterol, Total: 174 mg/dL (ref 100–199)
HDL: 52 mg/dL (ref >39)
LDL Calculated: 109 mg/dL — ABNORMAL HIGH (ref 0–99)
LDl/HDL Ratio: 2.1 ratio (ref 0.0–3.6)
Triglycerides: 65 mg/dL (ref 0–149)
VLDL Cholesterol Cal: 13 mg/dL (ref 5–40)

## 2017-01-02 LAB — INSULIN, RANDOM: INSULIN: 25.3 u[IU]/mL — ABNORMAL HIGH (ref 2.6–24.9)

## 2017-01-02 LAB — COMPREHENSIVE METABOLIC PANEL
ALT: 21 IU/L (ref 0–44)
AST: 19 IU/L (ref 0–40)
Albumin/Globulin Ratio: 1.8 (ref 1.2–2.2)
Albumin: 4.3 g/dL (ref 3.6–4.8)
Alkaline Phosphatase: 51 IU/L (ref 39–117)
BUN/Creatinine Ratio: 26 — ABNORMAL HIGH (ref 10–24)
BUN: 25 mg/dL (ref 8–27)
Bilirubin Total: 0.4 mg/dL (ref 0.0–1.2)
CO2: 22 mmol/L (ref 20–29)
Calcium: 9 mg/dL (ref 8.6–10.2)
Chloride: 99 mmol/L (ref 96–106)
Creatinine, Ser: 0.95 mg/dL (ref 0.76–1.27)
GFR calc Af Amer: 98 mL/min/{1.73_m2} (ref >59)
GFR calc non Af Amer: 85 mL/min/{1.73_m2} (ref >59)
Globulin, Total: 2.4 g/dL (ref 1.5–4.5)
Glucose: 98 mg/dL (ref 65–99)
Potassium: 4 mmol/L (ref 3.5–5.2)
Sodium: 138 mmol/L (ref 134–144)
Total Protein: 6.7 g/dL (ref 6.0–8.5)

## 2017-01-02 LAB — VITAMIN D 25 HYDROXY (VIT D DEFICIENCY, FRACTURES): Vit D, 25-Hydroxy: 43.7 ng/mL (ref 30.0–100.0)

## 2017-01-02 LAB — HEMOGLOBIN A1C
Est. average glucose Bld gHb Est-mCnc: 114 mg/dL
Hgb A1c MFr Bld: 5.6 % (ref 4.8–5.6)

## 2017-01-17 ENCOUNTER — Ambulatory Visit (INDEPENDENT_AMBULATORY_CARE_PROVIDER_SITE_OTHER): Payer: BLUE CROSS/BLUE SHIELD | Admitting: Family Medicine

## 2017-01-17 VITALS — BP 118/72 | HR 67 | Temp 98.0°F | Ht 65.0 in | Wt 270.0 lb

## 2017-01-17 DIAGNOSIS — R7303 Prediabetes: Secondary | ICD-10-CM

## 2017-01-17 DIAGNOSIS — E669 Obesity, unspecified: Secondary | ICD-10-CM | POA: Diagnosis not present

## 2017-01-17 DIAGNOSIS — Z6841 Body Mass Index (BMI) 40.0 and over, adult: Secondary | ICD-10-CM

## 2017-01-17 DIAGNOSIS — E784 Other hyperlipidemia: Secondary | ICD-10-CM

## 2017-01-17 DIAGNOSIS — E559 Vitamin D deficiency, unspecified: Secondary | ICD-10-CM

## 2017-01-17 DIAGNOSIS — E7849 Other hyperlipidemia: Secondary | ICD-10-CM

## 2017-01-17 DIAGNOSIS — IMO0001 Reserved for inherently not codable concepts without codable children: Secondary | ICD-10-CM

## 2017-01-21 NOTE — Progress Notes (Signed)
Office: [phone]  /  Fax: [phone]   HPI:   Chief Complaint: OBESITY James [name] is here to discuss his progress with his obesity treatment plan. He is on the Category 3 plan and is following his eating plan approximately 95 % of the time. He states he is exercising isometric and swimming for 15 to 30 minutes 3 to 5 times per week. James [name] continues to do well with weight loss. Hunger is controlled on the category 3 plan and he liked the food choices and didn't feel deprived. His weight is 270 lb (122.5 kg) today and has had a weight loss of 4 pounds over a period of 2 weeks since his last visit. He has lost 35 lbs since starting treatment with [name].  Vitamin D deficiency James [name] has a diagnosis of vitamin D deficiency. He is improving on vit D but is not yet at goal. Fatigue is improving and he denies nausea, vomiting or muscle weakness.  Pre-Diabetes James [name] has a diagnosis of pre-diabetes based on his elevated Hgb A1c and was informed this puts him at greater risk of developing diabetes. He is improved with diet and exercise. We reviewed labs together today. James [name] is doing well. He is taking metformin currently and continues to work on diet and exercise to decrease risk of diabetes. He denies nausea or hypoglycemia.  Hyperlipidemia James [name] has hyperlipidemia LDL is improving with diet and exercise.We reviewed labs together today. James [name] has been trying to improve his cholesterol levels with intensive lifestyle modification including a low saturated fat diet, exercise and weight loss. He is doing well with lifestyle changes. He denies any chest pain, claudication or myalgias.   ALLERGIES: No Known Allergies  MEDICATIONS: Current Outpatient Prescriptions on File Prior to Visit  Medication Sig Dispense Refill   Acetaminophen (TYLENOL PO) Take by mouth.     cyclobenzaprine (FLEXERIL) 10 MG tablet Take 1 tablet (10 mg total) by mouth 3 (three) times daily as needed for muscle spasms. 30 tablet  0   hydrochlorothiazide (HYDRODIURIL) 25 MG tablet TAKE 1 TABLET (25 MG TOTAL) BY MOUTH DAILY. 90 tablet 1   ibuprofen (ADVIL,MOTRIN) 200 MG tablet Take 200 mg by mouth every 6 (six) hours as needed.     lisinopril (PRINIVIL,ZESTRIL) 5 MG tablet TAKE 1 TABLET (5 MG TOTAL) BY MOUTH DAILY. 90 tablet 1   metFORMIN (GLUCOPHAGE) 500 MG tablet Take 1 tablet (500 mg total) by mouth daily with breakfast. 30 tablet 0   Multiple Vitamins-Minerals (MULTIVITAMIN WITH MINERALS) tablet Take 1 tablet by mouth daily.     Vitamin D, Ergocalciferol, (DRISDOL) 50000 units CAPS capsule Take 1 capsule (50,000 Units total) by mouth every 7 (seven) days. 30 capsule 0   No current facility-administered medications on file prior to visit.     PAST MEDICAL HISTORY: Past Medical History:  Diagnosis Date   Arthritis    Chicken pox    Hypertension    Joint pain    Obesity    Osteoarthritis    Prediabetes    Swelling    feet and legs    PAST SURGICAL HISTORY: Past Surgical History:  Procedure Laterality Date   cartilage removal     KNEE SURGERY  2000    SOCIAL HISTORY: Social History  Substance Use Topics   Smoking status: Former Smoker    Quit date: 07/16/1992   Smokeless tobacco: Never Used   Alcohol use Yes     Comment: very little per patient     FAMILY HISTORY: Family  History  Problem Relation Age of Onset   Cancer Mother        lung and soft tissue cancer from radiation   Stroke Mother    Obesity Mother    Heart disease Father        CHF   Diabetes Father    Hypertension Father    Obesity Father    Heart disease Maternal Grandfather    Diabetes Paternal Grandmother    Ovarian cancer Sister     ROS: Review of Systems  Constitutional: Positive for malaise/fatigue and weight loss.  Cardiovascular: Negative for chest pain and claudication.  Gastrointestinal: Negative for nausea and vomiting.  Musculoskeletal: Negative for myalgias.       Negative  muscle weakness  Endo/Heme/Allergies:       Negative hypoglycemia    PHYSICAL EXAM: Blood pressure 118/72, pulse 67, temperature 98 F (36.7 C), temperature source Oral, height 5\' 5"  (1.651 m), weight 270 lb (122.5 kg), SpO2 95 %. Body mass index is 44.93 kg/m. Physical Exam  Constitutional: He is oriented to person, place, and time. He appears well-developed and well-nourished.  Cardiovascular: Normal rate.   Pulmonary/Chest: Effort normal.  Musculoskeletal: Normal range of motion.  Neurological: He is oriented to person, place, and time.  Skin: Skin is warm and dry.  Psychiatric: He has a normal mood and affect. His behavior is normal.  Vitals reviewed.   RECENT LABS AND TESTS: BMET    Component Value Date/Time   NA 138 01/01/2017 0842   K 4.0 01/01/2017 0842   CL 99 01/01/2017 0842   CO2 22 01/01/2017 0842   GLUCOSE 98 01/01/2017 0842   GLUCOSE 108 (H) 09/13/2015 0910   BUN 25 01/01/2017 0842   CREATININE 0.95 01/01/2017 0842   CALCIUM 9.0 01/01/2017 0842   GFRNONAA 85 01/01/2017 0842   GFRAA 98 01/01/2017 0842   Lab Results  Component Value Date   HGBA1C 5.6 01/01/2017   HGBA1C 5.7 (H) 09/04/2016   HGBA1C 5.9 09/13/2015   HGBA1C 5.8 07/17/2012   Lab Results  Component Value Date   INSULIN 25.3 (H) 01/01/2017   INSULIN 17.2 09/04/2016   CBC    Component Value Date/Time   WBC 5.7 09/04/2016 1141   WBC 6.4 09/13/2015 0910   RBC 4.98 09/04/2016 1141   RBC 4.96 09/13/2015 0910   HGB 14.9 09/04/2016 1141   HCT 44.1 09/04/2016 1141   PLT 192 09/04/2016 1141   MCV 89 09/04/2016 1141   MCH 29.9 09/04/2016 1141   MCHC 33.8 09/04/2016 1141   MCHC 33.6 09/13/2015 0910   RDW 14.6 09/04/2016 1141   LYMPHSABS 1.6 09/04/2016 1141   MONOABS 0.5 09/13/2015 0910   EOSABS 0.0 09/04/2016 1141   BASOSABS 0.0 09/04/2016 1141   Iron/TIBC/Ferritin/ %Sat No results found for: IRON, TIBC, FERRITIN, IRONPCTSAT Lipid Panel     Component Value Date/Time   CHOL 174  01/01/2017 0842   TRIG 65 01/01/2017 0842   HDL 52 01/01/2017 0842   CHOLHDL 3 09/13/2015 0910   VLDL 14.2 09/13/2015 0910   LDLCALC 109 (H) 01/01/2017 0842   Hepatic Function Panel     Component Value Date/Time   PROT 6.7 01/01/2017 0842   ALBUMIN 4.3 01/01/2017 0842   AST 19 01/01/2017 0842   ALT 21 01/01/2017 0842   ALKPHOS 51 01/01/2017 0842   BILITOT 0.4 01/01/2017 0842   BILIDIR 0.1 11/14/2006 0928      Component Value Date/Time   TSH 1.830 09/04/2016 1141  TSH 1.42 11/30/2013 1318   TSH 1.95 11/14/2006 0928    ASSESSMENT AND PLAN: Prediabetes  Other hyperlipidemia  Vitamin D deficiency  Class 3 obesity with serious comorbidity and body mass index (BMI) of 45.0 to 49.9 in adult, unspecified obesity type (James [name])  PLAN:  Vitamin D Deficiency Kendale was informed that low vitamin D levels contributes to fatigue and are associated with obesity, breast, and colon cancer. He agrees to continue to take prescription Vit D @50 ,000 IU every week and we will re-check labs in 3 months and will follow up for routine testing of vitamin D, at least 2-3 times per year. He was informed of the risk of over-replacement of vitamin D and agrees to not increase his dose unless he discusses this with [name] first.  Pre-Diabetes James [name] will continue to work on weight loss, exercise, and decreasing simple carbohydrates in his diet to help decrease the risk of diabetes. We dicussed metformin including benefits and risks. He was informed that eating too many simple carbohydrates or too many calories at one sitting increases the likelihood of GI side effects. James [name] will continue metformin for now and a prescription was not written today. James [name] agreed to follow up with [name] as directed to monitor his progress.  Hyperlipidemia James [name] was informed of the American Heart Association Guidelines emphasizing intensive lifestyle modifications as the first line treatment for hyperlipidemia. We discussed many  lifestyle modifications today in depth, and James [name] will continue to work on decreasing saturated fats such as fatty red meat, butter and many fried foods. He will also increase vegetables and lean protein in his diet and continue to work on exercise and weight loss efforts.  We spent > than 50% of the 30 minute visit on the counseling as documented in the note.  Obesity James [name] is currently in the action stage of change. As such, his goal is to continue with weight loss efforts He has agreed to follow the Category 3 plan James [name] has been instructed to work up to a goal of 150 minutes of combined cardio and strengthening exercise per week for weight loss and overall health benefits. We discussed the following Behavioral Modification Strategies today: increasing lean protein intake, work on meal planning and easy cooking plans and dealing with family or coworker sabotage  James [name] has agreed to follow up with our clinic in 2 to 3 weeks. He was informed of the importance of frequent follow up visits to maximize his success with intensive lifestyle modifications for his multiple health conditions.  I, James [name], am acting as transcriptionist for Dennard Nip, MD  I have reviewed the above documentation for accuracy and completeness, and I agree with the above. -Dennard Nip, MD   OBESITY BEHAVIORAL INTERVENTION VISIT  Today's visit was # 10 out of 22.  Starting weight: 305 lbs Starting date: 09/04/16 Today's weight : 270 lbs Today's date: 01/17/2017 Total lbs lost to date: 50 (Patients must lose 7 lbs in the first 6 months to continue with counseling)   ASK: We discussed the diagnosis of obesity with [name] today and [name] agreed to give [name] permission to discuss obesity behavioral modification therapy today.  ASSESS: Juris has the diagnosis of obesity and his BMI today is 44.93 Revan is in the action stage of change   ADVISE: Jester was educated on the multiple health risks of  obesity as well as the benefit of weight loss to improve his health. He was advised of the need for long term treatment  and the importance of lifestyle modifications.  AGREE: Multiple dietary modification options and treatment options were discussed and  Chon agreed to follow the Category 3 plan We discussed the following Behavioral Modification Strategies today: increasing lean protein intake, work on meal planning and easy cooking plans and dealing with family or coworker sabotage

## 2017-01-25 ENCOUNTER — Encounter: Payer: Self-pay | Admitting: Adult Health

## 2017-01-29 ENCOUNTER — Ambulatory Visit (INDEPENDENT_AMBULATORY_CARE_PROVIDER_SITE_OTHER): Payer: BLUE CROSS/BLUE SHIELD | Admitting: Physician Assistant

## 2017-01-29 VITALS — BP 127/77 | HR 59 | Temp 98.4°F | Ht 65.0 in | Wt 268.0 lb

## 2017-01-29 DIAGNOSIS — E559 Vitamin D deficiency, unspecified: Secondary | ICD-10-CM | POA: Diagnosis not present

## 2017-01-29 DIAGNOSIS — E8881 Metabolic syndrome: Secondary | ICD-10-CM

## 2017-01-29 DIAGNOSIS — Z6841 Body Mass Index (BMI) 40.0 and over, adult: Secondary | ICD-10-CM | POA: Diagnosis not present

## 2017-01-29 DIAGNOSIS — IMO0001 Reserved for inherently not codable concepts without codable children: Secondary | ICD-10-CM

## 2017-01-29 DIAGNOSIS — E669 Obesity, unspecified: Secondary | ICD-10-CM

## 2017-01-29 MED ORDER — METFORMIN HCL 500 MG PO TABS: 500.0000 mg | ORAL_TABLET | Freq: Every day | ORAL | 0 refills | Status: DC

## 2017-01-29 NOTE — Progress Notes (Signed)
Office: [phone]  /  Fax: [phone]   HPI:   Chief Complaint: OBESITY James [name] is here to discuss his progress with his obesity treatment plan. He is on the Category 3 plan and is following his eating plan approximately 95 % of the time. He states he is stretching for exercise for 20 minutes 5 times per week. James [name] continues to do well with weight loss. He plans his meals well and states hunger is well controlled. James [name] would like more variety at breakfast. His weight is 268 lb (121.6 kg) today and has had a weight loss of 2 pounds over a period of 2 weeks since his last visit. He has lost 37 lbs since starting treatment with [name].  Vitamin D deficiency James [name] has a diagnosis of vitamin D deficiency. He is currently taking vit D and denies nausea, vomiting or muscle weakness.  Insulin Resistance James [name] has a diagnosis of insulin resistance based on his elevated fasting insulin level >5. Although James [name]'s blood glucose readings are still under good control, insulin resistance puts him at greater risk of metabolic syndrome and diabetes. He is taking metformin currently and continues to work on diet and exercise to decrease risk of diabetes. James [name] denies polyphagia.  ALLERGIES: No Known Allergies  MEDICATIONS: Current Outpatient Prescriptions on File Prior to Visit  Medication Sig Dispense Refill  . Acetaminophen (TYLENOL PO) Take by mouth.    . cyclobenzaprine (FLEXERIL) 10 MG tablet Take 1 tablet (10 mg total) by mouth 3 (three) times daily as needed for muscle spasms. 30 tablet 0  . hydrochlorothiazide (HYDRODIURIL) 25 MG tablet TAKE 1 TABLET (25 MG TOTAL) BY MOUTH DAILY. 90 tablet 1  . ibuprofen (ADVIL,MOTRIN) 200 MG tablet Take 200 mg by mouth every 6 (six) hours as needed.    Marland Kitchen lisinopril (PRINIVIL,ZESTRIL) 5 MG tablet TAKE 1 TABLET (5 MG TOTAL) BY MOUTH DAILY. 90 tablet 1  . Multiple Vitamins-Minerals (MULTIVITAMIN WITH MINERALS) tablet Take 1 tablet by mouth daily.    . Vitamin D,  Ergocalciferol, (DRISDOL) 50000 units CAPS capsule Take 1 capsule (50,000 Units total) by mouth every 7 (seven) days. 30 capsule 0   No current facility-administered medications on file prior to visit.     PAST MEDICAL HISTORY: Past Medical History:  Diagnosis Date  . Arthritis   . Chicken pox   . Hypertension   . Joint pain   . Obesity   . Osteoarthritis   . Prediabetes   . Swelling    feet and legs    PAST SURGICAL HISTORY: Past Surgical History:  Procedure Laterality Date  . cartilage removal    . KNEE SURGERY  2000    SOCIAL HISTORY: Social History  Substance Use Topics  . Smoking status: Former Smoker    Quit date: 07/16/1992  . Smokeless tobacco: Never Used  . Alcohol use Yes     Comment: very little per patient     FAMILY HISTORY: Family History  Problem Relation Age of Onset  . Cancer Mother        lung and soft tissue cancer from radiation  . Stroke Mother   . Obesity Mother   . Heart disease Father        CHF  . Diabetes Father   . Hypertension Father   . Obesity Father   . Heart disease Maternal Grandfather   . Diabetes Paternal Grandmother   . Ovarian cancer Sister     ROS: Review of Systems  Constitutional: Positive for weight loss.  Gastrointestinal: Negative for nausea and vomiting.  Musculoskeletal:       Negative muscle weakness  Endo/Heme/Allergies:       Negative polyphagia    PHYSICAL EXAM: Blood pressure 127/77, pulse (!) 59, temperature 98.4 F (36.9 C), temperature source Oral, height 5\' 5"  (1.651 m), weight 268 lb (121.6 kg), SpO2 96 %. Body mass index is 44.6 kg/m. Physical Exam  Constitutional: He is oriented to person, place, and time. He appears well-developed and well-nourished.  Cardiovascular:  Bradycardic  Pulmonary/Chest: Effort normal.  Neurological: He is alert and oriented to person, place, and time.  Skin: Skin is warm and dry.  Psychiatric: He has a normal mood and affect.    RECENT LABS AND  TESTS: BMET    Component Value Date/Time   NA 138 01/01/2017 0842   K 4.0 01/01/2017 0842   CL 99 01/01/2017 0842   CO2 22 01/01/2017 0842   GLUCOSE 98 01/01/2017 0842   GLUCOSE 108 (H) 09/13/2015 0910   BUN 25 01/01/2017 0842   CREATININE 0.95 01/01/2017 0842   CALCIUM 9.0 01/01/2017 0842   GFRNONAA 85 01/01/2017 0842   GFRAA 98 01/01/2017 0842   Lab Results  Component Value Date   HGBA1C 5.6 01/01/2017   HGBA1C 5.7 (H) 09/04/2016   HGBA1C 5.9 09/13/2015   HGBA1C 5.8 07/17/2012   Lab Results  Component Value Date   INSULIN 25.3 (H) 01/01/2017   INSULIN 17.2 09/04/2016   CBC    Component Value Date/Time   WBC 5.7 09/04/2016 1141   WBC 6.4 09/13/2015 0910   RBC 4.98 09/04/2016 1141   RBC 4.96 09/13/2015 0910   HGB 14.9 09/04/2016 1141   HCT 44.1 09/04/2016 1141   PLT 192 09/04/2016 1141   MCV 89 09/04/2016 1141   MCH 29.9 09/04/2016 1141   MCHC 33.8 09/04/2016 1141   MCHC 33.6 09/13/2015 0910   RDW 14.6 09/04/2016 1141   LYMPHSABS 1.6 09/04/2016 1141   MONOABS 0.5 09/13/2015 0910   EOSABS 0.0 09/04/2016 1141   BASOSABS 0.0 09/04/2016 1141   Iron/TIBC/Ferritin/ %Sat No results found for: IRON, TIBC, FERRITIN, IRONPCTSAT Lipid Panel     Component Value Date/Time   CHOL 174 01/01/2017 0842   TRIG 65 01/01/2017 0842   HDL 52 01/01/2017 0842   CHOLHDL 3 09/13/2015 0910   VLDL 14.2 09/13/2015 0910   LDLCALC 109 (H) 01/01/2017 0842   Hepatic Function Panel     Component Value Date/Time   PROT 6.7 01/01/2017 0842   ALBUMIN 4.3 01/01/2017 0842   AST 19 01/01/2017 0842   ALT 21 01/01/2017 0842   ALKPHOS 51 01/01/2017 0842   BILITOT 0.4 01/01/2017 0842   BILIDIR 0.1 11/14/2006 0928      Component Value Date/Time   TSH 1.830 09/04/2016 1141   TSH 1.42 11/30/2013 1318   TSH 1.95 11/14/2006 0928    ASSESSMENT AND PLAN: Insulin resistance - Plan: metFORMIN (GLUCOPHAGE) 500 MG tablet  Vitamin D deficiency  Class 3 obesity with serious comorbidity and  body mass index (BMI) of 40.0 to 44.9 in adult, unspecified obesity type ([place])  PLAN:  Vitamin D Deficiency James [name] was informed that low vitamin D levels contributes to fatigue and are associated with obesity, breast, and colon cancer. He agrees to continue to take prescription Vit D @50 ,000 IU every week and will follow up for routine testing of vitamin D, at least 2-3 times per year. He was informed of the risk of over-replacement of vitamin D and  agrees to not increase his dose unless he discusses this with [name] first.  Insulin Resistance Jumar will continue to work on weight loss, exercise, and decreasing simple carbohydrates in his diet to help decrease the risk of diabetes. We dicussed metformin including benefits and risks. He was informed that eating too many simple carbohydrates or too many calories at one sitting increases the likelihood of GI side effects. Matheo agrees to continue metformin for now and prescription was written today for 1 month refill. Saifullah agreed to follow up with [name] as directed to monitor his progress.  Obesity Marius is currently in the action stage of change. As such, his goal is to continue with weight loss efforts He has agreed to follow the Category 3 plan Polk has been instructed to work up to a goal of 150 minutes of combined cardio and strengthening exercise per week for weight loss and overall health benefits. We discussed the following Behavioral Modification Strategies today: increasing lean protein intake and work on meal planning and easy cooking plans  Sayvon has agreed to follow up with our clinic in 3 weeks. He was informed of the importance of frequent follow up visits to maximize his success with intensive lifestyle modifications for his multiple health conditions.  I, Doreene Nest, am acting as transcriptionist for Lacy Duverney, PA-C  I have reviewed the above documentation for accuracy and completeness, and I agree with the above. -Lacy Duverney,  PA-C  I have reviewed the above note and agree with the plan. -Dennard Nip, MD   OBESITY BEHAVIORAL INTERVENTION VISIT  Today's visit was # 11 out of 22.  Starting weight: 305 lbs Starting date: 09/04/16 Today's weight : 268 lbs Today's date: 01/29/2017 Total lbs lost to date: 36 (Patients must lose 7 lbs in the first 6 months to continue with counseling)   ASK: We discussed the diagnosis of obesity with [name] today and [name] agreed to give [name] permission to discuss obesity behavioral modification therapy today.  ASSESS: Maleik has the diagnosis of obesity and his BMI today is 44.6 Siegfried is in the action stage of change   ADVISE: Daniela was educated on the multiple health risks of obesity as well as the benefit of weight loss to improve his health. He was advised of the need for long term treatment and the importance of lifestyle modifications.  AGREE: Multiple dietary modification options and treatment options were discussed and  Karan agreed to follow the Category 3 plan We discussed the following Behavioral Modification Strategies today: increasing lean protein intake and work on meal planning and easy cooking plans

## 2017-01-29 NOTE — Telephone Encounter (Signed)
Please advise of any recommendations. Thanks.

## 2017-02-02 ENCOUNTER — Other Ambulatory Visit: Payer: Self-pay | Admitting: Adult Health

## 2017-02-05 NOTE — Telephone Encounter (Signed)
Sent to the pharmacy by e-scribe.  Pt due 09/2017 for cpx/yearly.

## 2017-02-19 ENCOUNTER — Ambulatory Visit (INDEPENDENT_AMBULATORY_CARE_PROVIDER_SITE_OTHER): Payer: BLUE CROSS/BLUE SHIELD | Admitting: Physician Assistant

## 2017-02-19 VITALS — BP 110/75 | HR 74 | Temp 98.0°F | Ht 65.0 in | Wt 271.0 lb

## 2017-02-19 DIAGNOSIS — I10 Essential (primary) hypertension: Secondary | ICD-10-CM | POA: Diagnosis not present

## 2017-02-19 DIAGNOSIS — E559 Vitamin D deficiency, unspecified: Secondary | ICD-10-CM | POA: Diagnosis not present

## 2017-02-19 DIAGNOSIS — Z6841 Body Mass Index (BMI) 40.0 and over, adult: Secondary | ICD-10-CM | POA: Diagnosis not present

## 2017-02-19 MED ORDER — VITAMIN D (ERGOCALCIFEROL) 1.25 MG (50000 UNIT) PO CAPS: 50000.0000 [IU] | ORAL_CAPSULE | ORAL | 0 refills | Status: DC

## 2017-02-19 NOTE — Progress Notes (Signed)
Office: [phone]  /  Fax: [phone]   HPI:   Chief Complaint: OBESITY James [name] is here to discuss his progress with his obesity treatment plan. He is on the Category 3 plan and is following his eating plan approximately 80 % of the time. He states he is walking for 20 minutes 3 times per week. James [name] had increased celebration eating and tried to make [employer] and control his portions. He is motivated to get back on track and continue weight loss efforts. His weight is 271 lb (122.9 kg) today and has had a weight gain of 3 pounds over a period of 3 weeks since his last visit. He has lost 34 lbs since starting treatment with [name].  Vitamin D deficiency James [name] has a diagnosis of vitamin D deficiency. He is currently taking vit D and denies nausea, vomiting or muscle weakness.  Hypertension James [name] is a 61 y.o. male with hypertension.  James [name] denies chest pain or shortness of breath on exertion. He is working weight loss to help control his blood pressure with the goal of decreasing his risk of heart attack and stroke. James [name] blood pressure is currently controlled.    ALLERGIES: No Known Allergies  MEDICATIONS: Current Outpatient Prescriptions on File Prior to Visit  Medication Sig Dispense Refill  . Acetaminophen (TYLENOL PO) Take by mouth.    . cyclobenzaprine (FLEXERIL) 10 MG tablet Take 1 tablet (10 mg total) by mouth 3 (three) times daily as needed for muscle spasms. 30 tablet 0  . hydrochlorothiazide (HYDRODIURIL) 25 MG tablet TAKE 1 TABLET (25 MG TOTAL) BY MOUTH DAILY. 90 tablet 1  . ibuprofen (ADVIL,MOTRIN) 200 MG tablet Take 200 mg by mouth every 6 (six) hours as needed.    Marland Kitchen lisinopril (PRINIVIL,ZESTRIL) 5 MG tablet TAKE 1 TABLET (5 MG TOTAL) BY MOUTH DAILY. 90 tablet 1  . metFORMIN (GLUCOPHAGE) 500 MG tablet Take 1 tablet (500 mg total) by mouth daily with breakfast. 30 tablet 0  . Multiple Vitamins-Minerals (MULTIVITAMIN WITH MINERALS)  tablet Take 1 tablet by mouth daily.    . Vitamin D, Ergocalciferol, (DRISDOL) 50000 units CAPS capsule Take 1 capsule (50,000 Units total) by mouth every 7 (seven) days. 30 capsule 0   No current facility-administered medications on file prior to visit.     PAST MEDICAL HISTORY: Past Medical History:  Diagnosis Date  . Arthritis   . Chicken pox   . Hypertension   . Joint pain   . Obesity   . Osteoarthritis   . Prediabetes   . Swelling    feet and legs    PAST SURGICAL HISTORY: Past Surgical History:  Procedure Laterality Date  . cartilage removal    . KNEE SURGERY  2000    SOCIAL HISTORY: Social History  Substance Use Topics  . Smoking status: Former Smoker    Quit date: 07/16/1992  . Smokeless tobacco: Never Used  . Alcohol use Yes     Comment: very little per patient     FAMILY HISTORY: Family History  Problem Relation Age of Onset  . Cancer Mother        lung and soft tissue cancer from radiation  . Stroke Mother   . Obesity Mother   . Heart disease Father        CHF  . Diabetes Father   . Hypertension Father   . Obesity Father   . Heart disease Maternal Grandfather   . Diabetes Paternal Grandmother   .  Ovarian cancer Sister     ROS: Review of Systems  Constitutional: Negative for weight loss.  Respiratory: Negative for shortness of breath (on exertion).   Cardiovascular: Negative for chest pain.  Gastrointestinal: Negative for nausea and vomiting.  Musculoskeletal:       Negative muscle weakness    PHYSICAL EXAM: Blood pressure 110/75, pulse 74, temperature 98 F (36.7 C), temperature source Oral, height 5\' 5"  (1.651 m), weight 271 lb (122.9 kg), SpO2 98 %. Body mass index is 45.1 kg/m. Physical Exam  Constitutional: He is oriented to person, place, and time. He appears well-developed and well-nourished.  Cardiovascular: Normal rate.   Pulmonary/Chest: Effort normal.  Musculoskeletal: Normal range of motion.  Neurological: He is oriented  to person, place, and time.  Skin: Skin is warm and dry.  Psychiatric: He has a normal mood and affect. His behavior is normal.  Vitals reviewed.   RECENT LABS AND TESTS: BMET    Component Value Date/Time   NA 138 01/01/2017 0842   K 4.0 01/01/2017 0842   CL 99 01/01/2017 0842   CO2 22 01/01/2017 0842   GLUCOSE 98 01/01/2017 0842   GLUCOSE 108 (H) 09/13/2015 0910   BUN 25 01/01/2017 0842   CREATININE 0.95 01/01/2017 0842   CALCIUM 9.0 01/01/2017 0842   GFRNONAA 85 01/01/2017 0842   GFRAA 98 01/01/2017 0842   Lab Results  Component Value Date   HGBA1C 5.6 01/01/2017   HGBA1C 5.7 (H) 09/04/2016   HGBA1C 5.9 09/13/2015   HGBA1C 5.8 07/17/2012   Lab Results  Component Value Date   INSULIN 25.3 (H) 01/01/2017   INSULIN 17.2 09/04/2016   CBC    Component Value Date/Time   WBC 5.7 09/04/2016 1141   WBC 6.4 09/13/2015 0910   RBC 4.98 09/04/2016 1141   RBC 4.96 09/13/2015 0910   HGB 14.9 09/04/2016 1141   HCT 44.1 09/04/2016 1141   PLT 192 09/04/2016 1141   MCV 89 09/04/2016 1141   MCH 29.9 09/04/2016 1141   MCHC 33.8 09/04/2016 1141   MCHC 33.6 09/13/2015 0910   RDW 14.6 09/04/2016 1141   LYMPHSABS 1.6 09/04/2016 1141   MONOABS 0.5 09/13/2015 0910   EOSABS 0.0 09/04/2016 1141   BASOSABS 0.0 09/04/2016 1141   Iron/TIBC/Ferritin/ %Sat No results found for: IRON, TIBC, FERRITIN, IRONPCTSAT Lipid Panel     Component Value Date/Time   CHOL 174 01/01/2017 0842   TRIG 65 01/01/2017 0842   HDL 52 01/01/2017 0842   CHOLHDL 3 09/13/2015 0910   VLDL 14.2 09/13/2015 0910   LDLCALC 109 (H) 01/01/2017 0842   Hepatic Function Panel     Component Value Date/Time   PROT 6.7 01/01/2017 0842   ALBUMIN 4.3 01/01/2017 0842   AST 19 01/01/2017 0842   ALT 21 01/01/2017 0842   ALKPHOS 51 01/01/2017 0842   BILITOT 0.4 01/01/2017 0842   BILIDIR 0.1 11/14/2006 0928      Component Value Date/Time   TSH 1.830 09/04/2016 1141   TSH 1.42 11/30/2013 1318   TSH 1.95 11/14/2006  0928    ASSESSMENT AND PLAN: Vitamin D deficiency - Plan: Vitamin D, Ergocalciferol, (DRISDOL) 50000 units CAPS capsule  Essential hypertension  Class 3 severe obesity with serious comorbidity and body mass index (BMI) of 45.0 to 49.9 in adult, unspecified obesity type (James [name])  PLAN:  Vitamin D Deficiency James [name] was informed that low vitamin D levels contributes to fatigue and are associated with obesity, breast, and colon cancer. He agrees to  continue to take prescription Vit D @50 ,000 IU every week #4 with no refills and will follow up for routine testing of vitamin D, at least 2-3 times per year. He was informed of the risk of over-replacement of vitamin D and agrees to not increase his dose unless he discusses this with [name] first. James [name] agrees to follow up with our clinic in 4 weeks.  Hypertension We discussed sodium restriction, working on healthy weight loss, and a regular exercise program as the means to achieve improved blood pressure control. James [name] agreed with this plan and agreed to follow up as directed. We will continue to monitor his blood pressure as well as his progress with the above lifestyle modifications. He will continue his medications as prescribed and will watch for signs of hypotension as he continues his lifestyle modifications.  Obesity James [name] is currently in the action stage of change. As such, his goal is to continue with weight loss efforts He has agreed to keep a food journal with 1600 calories and 100 grams of protein daily James [name] has been instructed to work up to a goal of 150 minutes of combined cardio and strengthening exercise per week for weight loss and overall health benefits. We discussed the following Behavioral Modification Strategies today: increasing lean protein intake and work on meal planning and easy cooking plans  Rakesh has agreed to follow up with our clinic in 4 weeks. He was informed of the importance of frequent follow up visits to maximize his  success with intensive lifestyle modifications for his multiple health conditions.  I, James [name], am acting as transcriptionist for James Duverney, PA-C  I have reviewed the above documentation for accuracy and completeness, and I agree with the above. -James Duverney, PA-C  I have reviewed the above note and agree with the plan. -James Nip, MD   OBESITY BEHAVIORAL INTERVENTION VISIT  Today's visit was # 12 out of 22.  Starting weight: 305 lbs Starting date: 09/04/16 Today's weight : 271 lbs  Today's date: 02/19/2017 Total lbs lost to date: 36 (Patients must lose 7 lbs in the first 6 months to continue with counseling)   ASK: We discussed the diagnosis of obesity with James [name] today and James [name] agreed to give [name] permission to discuss obesity behavioral modification therapy today.  ASSESS: Rayshaun has the diagnosis of obesity and his BMI today is 45.1 Ovid is in the action stage of change   ADVISE: Zuhair was educated on the multiple health risks of obesity as well as the benefit of weight loss to improve his health. He was advised of the need for long term treatment and the importance of lifestyle modifications.  AGREE: Multiple dietary modification options and treatment options were discussed and  Ellie agreed to keep a food journal with 1600 calories and 100 grams of protein daily We discussed the following Behavioral Modification Strategies today: increasing lean protein intake and work on meal planning and easy cooking plans

## 2017-03-05 ENCOUNTER — Ambulatory Visit (INDEPENDENT_AMBULATORY_CARE_PROVIDER_SITE_OTHER): Payer: BLUE CROSS/BLUE SHIELD | Admitting: Dietician

## 2017-03-05 VITALS — Ht 65.0 in | Wt 268.0 lb

## 2017-03-05 DIAGNOSIS — R7303 Prediabetes: Secondary | ICD-10-CM | POA: Diagnosis not present

## 2017-03-05 DIAGNOSIS — Z6841 Body Mass Index (BMI) 40.0 and over, adult: Secondary | ICD-10-CM

## 2017-03-05 DIAGNOSIS — Z9189 Other specified personal risk factors, not elsewhere classified: Secondary | ICD-10-CM | POA: Diagnosis not present

## 2017-03-05 NOTE — Progress Notes (Signed)
  Office: [phone]  /  Fax: [phone]     Nettie has a diagnosis of prediabetes based on his elevated HgA1c and a diagnosis of obesity and was informed this puts him at greater risk of developing diabetes. Armondo is here today for diabetes prevention nutrition counseling. Leandre is journaling is food intake, 1600 calories and 100+ grams of protein. He is journaling approximately 50-50% of the time and states he will follow the Category 3 meal plan when he doesn't journal. He reports getting his protein in daily. He states his hunger is well controlled and is motivated to continue his obesity treatment plan.   Patient was educated about food nutrients ie protein, fats, simple and complex carbohydrates and how these affect insulin response. Focus on portion control,  avoiding simple carbohydrates and lower fat foods for ongoing wt loss efforts and glucose management  His meal plan was individualized for maximum benefit.  Also discussed at length the following behavioral modifications to help maximize healthy options when eating out, holiday healthy eating strategies.    Elijan has been instructed to work up to a goal of 150 minutes of combined cardio and strengthening exercise per week for weight loss and overall health benefits. Written information was provided and the following handouts were given:  healthy recipes, healthy eating out.    Office: [phone]  /  Fax: [phone]  OBESITY BEHAVIORAL INTERVENTION VISIT  Today's visit was # 13 out of 22.  Starting weight: 305 lbs Starting date: 09/04/16 Today's weight : Weight: 268 lb (121.6 kg)  Today's date: 03/05/2017 Total lbs lost to date: 37 lbs (Patients must lose 7 lbs in the first 6 months to continue with counseling)   ASK: We discussed the diagnosis of obesity with [name] today and [name] agreed to give [name] permission to discuss obesity behavioral modification therapy today.  ASSESS: Eryk has the diagnosis of  obesity and his BMI today is 44.6 Cambell is in the action stage of change   ADVISE: Jaceyon was educated on the multiple health risks of obesity as well as the benefit of weight loss to improve his health. He was advised of the need for long term treatment and the importance of lifestyle modifications.  AGREE: Multiple dietary modification options and treatment options were discussed and  Calden agreed to keep a food journal with 1600 calories and 100+ grams protein  We discussed the following Behavioral Modification Stratagies today: decrease eating out and holiday eating strategies

## 2017-03-06 ENCOUNTER — Telehealth (INDEPENDENT_AMBULATORY_CARE_PROVIDER_SITE_OTHER): Payer: Self-pay | Admitting: Family Medicine

## 2017-03-06 ENCOUNTER — Other Ambulatory Visit (INDEPENDENT_AMBULATORY_CARE_PROVIDER_SITE_OTHER): Payer: Self-pay | Admitting: Physician Assistant

## 2017-03-06 DIAGNOSIS — E8881 Metabolic syndrome: Secondary | ICD-10-CM

## 2017-03-06 NOTE — Telephone Encounter (Signed)
Metformin to be called in to [facility] on [address]

## 2017-03-07 MED ORDER — METFORMIN HCL 500 MG PO TABS: 500.0000 mg | ORAL_TABLET | Freq: Every day | ORAL | 0 refills | Status: DC

## 2017-03-07 NOTE — Telephone Encounter (Signed)
Prescription sent in to the pharmacy. April, CMA

## 2017-03-19 ENCOUNTER — Ambulatory Visit (INDEPENDENT_AMBULATORY_CARE_PROVIDER_SITE_OTHER): Payer: BLUE CROSS/BLUE SHIELD | Admitting: Physician Assistant

## 2017-03-19 VITALS — BP 106/71 | HR 77 | Temp 97.8°F | Ht 65.0 in | Wt 268.0 lb

## 2017-03-19 DIAGNOSIS — Z6841 Body Mass Index (BMI) 40.0 and over, adult: Secondary | ICD-10-CM

## 2017-03-19 DIAGNOSIS — I10 Essential (primary) hypertension: Secondary | ICD-10-CM

## 2017-03-19 NOTE — Progress Notes (Signed)
Office: [phone]  /  Fax: [phone]   HPI:   Chief Complaint: OBESITY James [name] is here to discuss his progress with his obesity treatment plan. He is on the keep a food journal with 1600 calories and 100 grams of protein daily and is following his eating plan approximately 98 % of the time. He states he is walking for 60 minutes 1 time per week. James [name] continues to do well with weight loss. He plans his meals well and incorporates variety with his meals. He would like more meal planning ideas. His weight is 268 lb (121.6 kg) today and has had a weight loss of 3 pounds over a period of 4 weeks since his last visit. He has lost 37 lbs since starting treatment with [name].  Hypertension James [name] is a 61 y.o. male with hypertension. His blood pressure is stable. [name] denies chest pain or shortness of breath. He is working weight loss to help control his blood pressure with the goal of decreasing his risk of heart attack and stroke. [name] blood pressure is currently controlled.  ALLERGIES: No Known Allergies  MEDICATIONS: Current Outpatient Medications on File Prior to Visit  Medication Sig Dispense Refill  . Acetaminophen (TYLENOL PO) Take by mouth.    . cyclobenzaprine (FLEXERIL) 10 MG tablet Take 1 tablet (10 mg total) by mouth 3 (three) times daily as needed for muscle spasms. 30 tablet 0  . hydrochlorothiazide (HYDRODIURIL) 25 MG tablet TAKE 1 TABLET (25 MG TOTAL) BY MOUTH DAILY. 90 tablet 1  . ibuprofen (ADVIL,MOTRIN) 200 MG tablet Take 200 mg by mouth every 6 (six) hours as needed.    Marland Kitchen lisinopril (PRINIVIL,ZESTRIL) 5 MG tablet TAKE 1 TABLET (5 MG TOTAL) BY MOUTH DAILY. 90 tablet 1  . metFORMIN (GLUCOPHAGE) 500 MG tablet Take 1 tablet (500 mg total) by mouth daily with breakfast. 30 tablet 0  . Multiple Vitamins-Minerals (MULTIVITAMIN WITH MINERALS) tablet Take 1 tablet by mouth daily.    . Vitamin D, Ergocalciferol, (DRISDOL) 50000 units CAPS capsule Take 1  capsule (50,000 Units total) by mouth every 7 (seven) days. 30 capsule 0   No current facility-administered medications on file prior to visit.     PAST MEDICAL HISTORY: Past Medical History:  Diagnosis Date  . Arthritis   . Chicken pox   . Hypertension   . Joint pain   . Obesity   . Osteoarthritis   . Prediabetes   . Swelling    feet and legs    PAST SURGICAL HISTORY: Past Surgical History:  Procedure Laterality Date  . cartilage removal    . KNEE SURGERY  2000    SOCIAL HISTORY: Social History   Tobacco Use  . Smoking status: Former Smoker    Last attempt to quit: 07/16/1992    Years since quitting: 24.6  . Smokeless tobacco: Never Used  Substance Use Topics  . Alcohol use: Yes    Comment: very little per patient   . Drug use: No    FAMILY HISTORY: Family History  Problem Relation Age of Onset  . Cancer Mother        lung and soft tissue cancer from radiation  . Stroke Mother   . Obesity Mother   . Heart disease Father        CHF  . Diabetes Father   . Hypertension Father   . Obesity Father   . Heart disease Maternal Grandfather   . Diabetes Paternal Grandmother   . Ovarian  cancer Sister     ROS: Review of Systems  Constitutional: Positive for weight loss.  Respiratory: Negative for shortness of breath.   Cardiovascular: Negative for chest pain.    PHYSICAL EXAM: Blood pressure 106/71, pulse 77, temperature 97.8 F (36.6 C), height 5\' 5"  (1.651 m), weight 268 lb (121.6 kg), SpO2 96 %. Body mass index is 44.6 kg/m. Physical Exam  Constitutional: He is oriented to person, place, and time. He appears well-developed and well-nourished.  Cardiovascular: Normal rate.  Pulmonary/Chest: Effort normal.  Musculoskeletal: Normal range of motion.  Neurological: He is oriented to person, place, and time.  Skin: Skin is warm and dry.  Psychiatric: He has a normal mood and affect. His behavior is normal.  Vitals reviewed.   RECENT LABS AND  TESTS: BMET    Component Value Date/Time   NA 138 01/01/2017 0842   K 4.0 01/01/2017 0842   CL 99 01/01/2017 0842   CO2 22 01/01/2017 0842   GLUCOSE 98 01/01/2017 0842   GLUCOSE 108 (H) 09/13/2015 0910   BUN 25 01/01/2017 0842   CREATININE 0.95 01/01/2017 0842   CALCIUM 9.0 01/01/2017 0842   GFRNONAA 85 01/01/2017 0842   GFRAA 98 01/01/2017 0842   Lab Results  Component Value Date   HGBA1C 5.6 01/01/2017   HGBA1C 5.7 (H) 09/04/2016   HGBA1C 5.9 09/13/2015   HGBA1C 5.8 07/17/2012   Lab Results  Component Value Date   INSULIN 25.3 (H) 01/01/2017   INSULIN 17.2 09/04/2016   CBC    Component Value Date/Time   WBC 5.7 09/04/2016 1141   WBC 6.4 09/13/2015 0910   RBC 4.98 09/04/2016 1141   RBC 4.96 09/13/2015 0910   HGB 14.9 09/04/2016 1141   HCT 44.1 09/04/2016 1141   PLT 192 09/04/2016 1141   MCV 89 09/04/2016 1141   MCH 29.9 09/04/2016 1141   MCHC 33.8 09/04/2016 1141   MCHC 33.6 09/13/2015 0910   RDW 14.6 09/04/2016 1141   LYMPHSABS 1.6 09/04/2016 1141   MONOABS 0.5 09/13/2015 0910   EOSABS 0.0 09/04/2016 1141   BASOSABS 0.0 09/04/2016 1141   Iron/TIBC/Ferritin/ %Sat No results found for: IRON, TIBC, FERRITIN, IRONPCTSAT Lipid Panel     Component Value Date/Time   CHOL 174 01/01/2017 0842   TRIG 65 01/01/2017 0842   HDL 52 01/01/2017 0842   CHOLHDL 3 09/13/2015 0910   VLDL 14.2 09/13/2015 0910   LDLCALC 109 (H) 01/01/2017 0842   Hepatic Function Panel     Component Value Date/Time   PROT 6.7 01/01/2017 0842   ALBUMIN 4.3 01/01/2017 0842   AST 19 01/01/2017 0842   ALT 21 01/01/2017 0842   ALKPHOS 51 01/01/2017 0842   BILITOT 0.4 01/01/2017 0842   BILIDIR 0.1 11/14/2006 0928      Component Value Date/Time   TSH 1.830 09/04/2016 1141   TSH 1.42 11/30/2013 1318   TSH 1.95 11/14/2006 0928    ASSESSMENT AND PLAN: Essential hypertension  Class 3 severe obesity with serious comorbidity and body mass index (BMI) of 40.0 to 44.9 in adult,  unspecified obesity type (HCC)  PLAN:  Hypertension We discussed sodium restriction, working on healthy weight loss, and a regular exercise program as the means to achieve improved blood pressure control. [name] agreed with this plan and agreed to follow up as directed. We will continue to monitor his blood pressure as well as his progress with the above lifestyle modifications. He will continue his medications as prescribed and will watch for  signs of hypotension as he continues his lifestyle modifications. Bentlie agrees to follow up with our clinic in 2 to 3 weeks.  We spent > than 50% of the 15 minute visit on the counseling as documented in the note.  Obesity Prospero is currently in the action stage of change. As such, his goal is to continue with weight loss efforts He has agreed to keep a food journal with 1600 calories and 100 grams of protein daily Matthias has been instructed to work up to a goal of 150 minutes of combined cardio and strengthening exercise per week for weight loss and overall health benefits. We discussed the following Behavioral Modification Strategies today: increasing lean protein intake and work on meal planning and easy cooking plans   Jaysten has agreed to follow up with our clinic in 2 to 3 weeks. He was informed of the importance of frequent follow up visits to maximize his success with intensive lifestyle modifications for his multiple health conditions.  I, Trixie Dredge, am acting as transcriptionist for Lacy Duverney, PA-C  I have reviewed the above documentation for accuracy and completeness, and I agree with the above. -Lacy Duverney, PA-C  I have reviewed the above note and agree with the plan. -Dennard Nip, MD      Today's visit was # 13 out of 22.  Starting weight: 305 lbs Starting date: 09/04/16 Today's weight : 268 lbs  Today's date: 03/19/2017 Total lbs lost to date: 70 (Patients must lose 7 lbs in the first 6 months to continue with  counseling)   ASK: We discussed the diagnosis of obesity with [name] today and [name] agreed to give [name] permission to discuss obesity behavioral modification therapy today.  ASSESS: Ancelmo has the diagnosis of obesity and his BMI today is 44.6 Lambros is in the action stage of change   ADVISE: Maaz was educated on the multiple health risks of obesity as well as the benefit of weight loss to improve his health. He was advised of the need for long term treatment and the importance of lifestyle modifications.  AGREE: Multiple dietary modification options and treatment options were discussed and  Gaylen agreed to keep a food journal with 1600 calories and 100 grams of protein  We discussed the following Behavioral Modification Strategies today: increasing lean protein intake and work on meal planning and easy cooking plans

## 2017-04-02 ENCOUNTER — Other Ambulatory Visit: Payer: Self-pay | Admitting: Adult Health

## 2017-04-02 NOTE — Telephone Encounter (Signed)
Sent to the pharmacy by e-scribe. 

## 2017-04-10 ENCOUNTER — Ambulatory Visit (INDEPENDENT_AMBULATORY_CARE_PROVIDER_SITE_OTHER): Payer: BLUE CROSS/BLUE SHIELD | Admitting: Physician Assistant

## 2017-04-10 VITALS — BP 114/73 | HR 75 | Temp 97.9°F | Ht 65.0 in | Wt 270.0 lb

## 2017-04-10 DIAGNOSIS — R5383 Other fatigue: Secondary | ICD-10-CM | POA: Diagnosis not present

## 2017-04-10 DIAGNOSIS — E7849 Other hyperlipidemia: Secondary | ICD-10-CM | POA: Diagnosis not present

## 2017-04-10 DIAGNOSIS — I10 Essential (primary) hypertension: Secondary | ICD-10-CM

## 2017-04-10 DIAGNOSIS — E559 Vitamin D deficiency, unspecified: Secondary | ICD-10-CM | POA: Diagnosis not present

## 2017-04-10 DIAGNOSIS — Z6841 Body Mass Index (BMI) 40.0 and over, adult: Secondary | ICD-10-CM | POA: Diagnosis not present

## 2017-04-10 DIAGNOSIS — R7303 Prediabetes: Secondary | ICD-10-CM | POA: Diagnosis not present

## 2017-04-10 MED ORDER — METFORMIN HCL 500 MG PO TABS: 500.0000 mg | ORAL_TABLET | Freq: Every day | ORAL | 0 refills | Status: DC

## 2017-04-10 NOTE — Progress Notes (Signed)
Office: [phone]  /  Fax: [phone]   HPI:   Chief Complaint: OBESITY James [name] is here to discuss his progress with his obesity treatment plan. He is on the keep a food journal with 1600 calories and 100 grams of protein daily and is following his eating plan approximately 95 % of the time. He states he is exercising 30 minutes 1 time per week. James [name] is mindful of his eating and he controls his portions, but has some challenges with planning all of his meals ahead of time. His weight is 270 lb (122.5 kg) today and has had a weight gain of 2 pounds over a period of 3 weeks since his last visit. He has lost 35 lbs since starting treatment with [name].  Fatigue James [name] feels his energy is lower than it should be. This improved with weight loss but still complains of ongoing fatigue.  James [name] states he sleeps well and wakes up rested. He denies daytime somnolence. He denies any shortness of breath or chest pain.   Pre-Diabetes James [name] has a diagnosis of pre-diabetes based on his elevated Hgb A1c and was informed this puts him at greater risk of developing diabetes. He is taking metformin currently and continues to work on diet and exercise to decrease risk of diabetes. He denies nausea, polyphagia or hypoglycemia.  Hyperlipidemia James [name] has hyperlipidemia and is not on statin. James [name] declines medications today. He has been trying to improve his cholesterol levels with intensive lifestyle modification including a low saturated fat diet, exercise and weight loss. He denies any chest pain, claudication or myalgias.  Hypertension James [name] is a 61 y.o. male with hypertension.  James [name] denies chest pain or shortness of breath on exertion. He is working weight loss to help control his blood pressure with the goal of decreasing his risk of heart attack and stroke. James [name] blood pressure is currently stable.  Vitamin D deficiency James [name] has a diagnosis of vitamin D deficiency. He is currently  taking vit D and denies nausea, vomiting or muscle weakness.  ALLERGIES: No Known Allergies  MEDICATIONS: Current Outpatient Medications on File Prior to Visit  Medication Sig Dispense Refill  . Acetaminophen (TYLENOL PO) Take by mouth.    . cyclobenzaprine (FLEXERIL) 10 MG tablet Take 1 tablet (10 mg total) by mouth 3 (three) times daily as needed for muscle spasms. 30 tablet 0  . hydrochlorothiazide (HYDRODIURIL) 25 MG tablet TAKE 1 TABLET BY MOUTH EVERY DAY 90 tablet 1  . ibuprofen (ADVIL,MOTRIN) 200 MG tablet Take 200 mg by mouth every 6 (six) hours as needed.    Marland Kitchen lisinopril (PRINIVIL,ZESTRIL) 5 MG tablet TAKE 1 TABLET (5 MG TOTAL) BY MOUTH DAILY. 90 tablet 1  . Multiple Vitamins-Minerals (MULTIVITAMIN WITH MINERALS) tablet Take 1 tablet by mouth daily.    . Vitamin D, Ergocalciferol, (DRISDOL) 50000 units CAPS capsule Take 1 capsule (50,000 Units total) by mouth every 7 (seven) days. 30 capsule 0   No current facility-administered medications on file prior to visit.     PAST MEDICAL HISTORY: Past Medical History:  Diagnosis Date  . Arthritis   . Chicken pox   . Hypertension   . Joint pain   . Obesity   . Osteoarthritis   . Prediabetes   . Swelling    feet and legs    PAST SURGICAL HISTORY: Past Surgical History:  Procedure Laterality Date  . cartilage removal    . KNEE SURGERY  2000    SOCIAL HISTORY: Social History  Tobacco Use  . Smoking status: Former Smoker    Last attempt to quit: 07/16/1992    Years since quitting: 24.7  . Smokeless tobacco: Never Used  Substance Use Topics  . Alcohol use: Yes    Comment: very little per patient   . Drug use: No    FAMILY HISTORY: Family History  Problem Relation Age of Onset  . Cancer Mother        lung and soft tissue cancer from radiation  . Stroke Mother   . Obesity Mother   . Heart disease Father        CHF  . Diabetes Father   . Hypertension Father   . Obesity Father   . Heart disease Maternal  Grandfather   . Diabetes Paternal Grandmother   . Ovarian cancer Sister     ROS: Review of Systems  Constitutional: Positive for malaise/fatigue. Negative for weight loss.  Respiratory: Negative for shortness of breath (on exertion).   Cardiovascular: Negative for chest pain and palpitations.  Gastrointestinal: Negative for nausea and vomiting.  Musculoskeletal: Negative for myalgias.       Negative muscle weakness  Endo/Heme/Allergies:       Negative polyphagia Negative hypoglycemia    PHYSICAL EXAM: Blood pressure 114/73, pulse 75, temperature 97.9 F (36.6 C), temperature source Oral, height 5\' 5"  (1.651 m), weight 270 lb (122.5 kg), SpO2 97 %. Body mass index is 44.93 kg/m. Physical Exam  Constitutional: He is oriented to person, place, and time. He appears well-developed and well-nourished.  Cardiovascular: Normal rate.  Pulmonary/Chest: Effort normal.  Musculoskeletal: Normal range of motion.  Neurological: He is oriented to person, place, and time.  Skin: Skin is warm and dry.  Psychiatric: He has a normal mood and affect. His behavior is normal.  Vitals reviewed.   RECENT LABS AND TESTS: BMET    Component Value Date/Time   NA 138 01/01/2017 0842   K 4.0 01/01/2017 0842   CL 99 01/01/2017 0842   CO2 22 01/01/2017 0842   GLUCOSE 98 01/01/2017 0842   GLUCOSE 108 (H) 09/13/2015 0910   BUN 25 01/01/2017 0842   CREATININE 0.95 01/01/2017 0842   CALCIUM 9.0 01/01/2017 0842   GFRNONAA 85 01/01/2017 0842   GFRAA 98 01/01/2017 0842   Lab Results  Component Value Date   HGBA1C 5.6 01/01/2017   HGBA1C 5.7 (H) 09/04/2016   HGBA1C 5.9 09/13/2015   HGBA1C 5.8 07/17/2012   Lab Results  Component Value Date   INSULIN 25.3 (H) 01/01/2017   INSULIN 17.2 09/04/2016   CBC    Component Value Date/Time   WBC 5.7 09/04/2016 1141   WBC 6.4 09/13/2015 0910   RBC 4.98 09/04/2016 1141   RBC 4.96 09/13/2015 0910   HGB 14.9 09/04/2016 1141   HCT 44.1 09/04/2016 1141    PLT 192 09/04/2016 1141   MCV 89 09/04/2016 1141   MCH 29.9 09/04/2016 1141   MCHC 33.8 09/04/2016 1141   MCHC 33.6 09/13/2015 0910   RDW 14.6 09/04/2016 1141   LYMPHSABS 1.6 09/04/2016 1141   MONOABS 0.5 09/13/2015 0910   EOSABS 0.0 09/04/2016 1141   BASOSABS 0.0 09/04/2016 1141   Iron/TIBC/Ferritin/ %Sat No results found for: IRON, TIBC, FERRITIN, IRONPCTSAT Lipid Panel     Component Value Date/Time   CHOL 174 01/01/2017 0842   TRIG 65 01/01/2017 0842   HDL 52 01/01/2017 0842   CHOLHDL 3 09/13/2015 0910   VLDL 14.2 09/13/2015 0910   LDLCALC 109 (H) 01/01/2017  0842   Hepatic Function Panel     Component Value Date/Time   PROT 6.7 01/01/2017 0842   ALBUMIN 4.3 01/01/2017 0842   AST 19 01/01/2017 0842   ALT 21 01/01/2017 0842   ALKPHOS 51 01/01/2017 0842   BILITOT 0.4 01/01/2017 0842   BILIDIR 0.1 11/14/2006 0928      Component Value Date/Time   TSH 1.830 09/04/2016 1141   TSH 1.42 11/30/2013 1318   TSH 1.95 11/14/2006 0928    ASSESSMENT AND PLAN: Other hyperlipidemia - Plan: Lipid Panel With LDL/HDL Ratio  Vitamin D deficiency - Plan: VITAMIN D 25 Hydroxy (Vit-D Deficiency, Fractures)  Prediabetes - Plan: Comprehensive metabolic panel, Hemoglobin A1c, Insulin, random, metFORMIN (GLUCOPHAGE) 500 MG tablet  Essential hypertension  Other fatigue - Plan: T3, T4, free, TSH  Class 3 severe obesity with serious comorbidity and body mass index (BMI) of 40.0 to 44.9 in adult, unspecified obesity type (HCC)  PLAN:  Fatigue James [name] was informed that his fatigue may be related to obesity, depression or many other causes. We will check thyroid and in the meanwhile Cullan has agreed to work on diet, exercise and weight loss to help with fatigue.   Pre-Diabetes James [name] will continue to work on weight loss, exercise, and decreasing simple carbohydrates in his diet to help decrease the risk of diabetes. We dicussed metformin including benefits and risks. He was informed that  eating too many simple carbohydrates or too many calories at one sitting increases the likelihood of GI side effects. James [name] requested metformin for now and a prescription was written today for 1 month refill. We will check labs and Herberth agreed to follow up with [name] as directed to monitor his progress.  Hyperlipidemia James [name] was informed of the American Heart Association Guidelines emphasizing intensive lifestyle modifications as the first line treatment for hyperlipidemia. We discussed many lifestyle modifications today in depth, and Tylik will continue to work on decreasing saturated fats such as fatty red meat, butter and many fried foods. He will also increase vegetables and lean protein in his diet and continue to work on exercise and weight loss efforts.  Hypertension We discussed sodium restriction, working on healthy weight loss, and a regular exercise program as the means to achieve improved blood pressure control. James [name] agreed with this plan and agreed to follow up as directed. We will continue to monitor his blood pressure as well as his progress with the above lifestyle modifications. He will continue his medications as prescribed and will watch for signs of hypotension as he continues his lifestyle modifications.  Vitamin D Deficiency James [name] was informed that low vitamin D levels contributes to fatigue and are associated with obesity, breast, and colon cancer. He agrees to continue to take prescription Vit D @50 ,000 IU every week and will follow up for routine testing of vitamin D, at least 2-3 times per year. He was informed of the risk of over-replacement of vitamin D and agrees to not increase his dose unless he discusses this with [name] first.  Obesity James [name] is currently in the action stage of change. As such, his goal is to continue with weight loss efforts He has agreed to follow the Category 3 plan James [name] has been instructed to work up to a goal of 150 minutes of combined cardio and  strengthening exercise per week for weight loss and overall health benefits. We discussed the following Behavioral Modification Strategies today: increasing lean protein intake and work on meal planning and easy cooking plans  James [name]  has agreed to follow up with our clinic in 2 weeks. He was informed of the importance of frequent follow up visits to maximize his success with intensive lifestyle modifications for his multiple health conditions.  I, Doreene Nest, am acting as transcriptionist for Lacy Duverney, PA-C  I have reviewed the above documentation for accuracy and completeness, and I agree with the above. -Lacy Duverney, PA-C  I have reviewed the above note and agree with the plan. -Dennard Nip, MD   OBESITY BEHAVIORAL INTERVENTION VISIT  Today's visit was # 14 out of 22.  Starting weight: 305 lbs Starting date: 09/04/16 Today's weight : 270 lbs  Today's date: 04/10/2017 Total lbs lost to date: 17 (Patients must lose 7 lbs in the first 6 months to continue with counseling)   ASK: We discussed the diagnosis of obesity with James [name] today and James [name] agreed to give [name] permission to discuss obesity behavioral modification therapy today.  ASSESS: Damyen has the diagnosis of obesity and his BMI today is 44.93 Deitrick is in the action stage of change   ADVISE: James [name] was educated on the multiple health risks of obesity as well as the benefit of weight loss to improve his health. He was advised of the need for long term treatment and the importance of lifestyle modifications.  AGREE: Multiple dietary modification options and treatment options were discussed and  Jobin agreed to follow the Category 3 plan We discussed the following Behavioral Modification Strategies today: increasing lean protein intake and work on meal planning and easy cooking plans

## 2017-04-11 LAB — COMPREHENSIVE METABOLIC PANEL
ALT: 17 IU/L (ref 0–44)
AST: 19 IU/L (ref 0–40)
Albumin/Globulin Ratio: 1.8 (ref 1.2–2.2)
Albumin: 4.5 g/dL (ref 3.6–4.8)
Alkaline Phosphatase: 56 IU/L (ref 39–117)
BUN/Creatinine Ratio: 21 (ref 10–24)
BUN: 23 mg/dL (ref 8–27)
Bilirubin Total: 0.4 mg/dL (ref 0.0–1.2)
CO2: 24 mmol/L (ref 20–29)
Calcium: 9.3 mg/dL (ref 8.6–10.2)
Chloride: 98 mmol/L (ref 96–106)
Creatinine, Ser: 1.11 mg/dL (ref 0.76–1.27)
GFR calc Af Amer: 81 mL/min/{1.73_m2} (ref >59)
GFR calc non Af Amer: 70 mL/min/{1.73_m2} (ref >59)
Globulin, Total: 2.5 g/dL (ref 1.5–4.5)
Glucose: 99 mg/dL (ref 65–99)
Potassium: 4.3 mmol/L (ref 3.5–5.2)
Sodium: 138 mmol/L (ref 134–144)
Total Protein: 7 g/dL (ref 6.0–8.5)

## 2017-04-11 LAB — T3: T3, Total: 95 ng/dL (ref 71–180)

## 2017-04-11 LAB — HEMOGLOBIN A1C
Est. average glucose Bld gHb Est-mCnc: 108 mg/dL
Hgb A1c MFr Bld: 5.4 % (ref 4.8–5.6)

## 2017-04-11 LAB — LIPID PANEL WITH LDL/HDL RATIO
Cholesterol, Total: 174 mg/dL (ref 100–199)
HDL: 52 mg/dL (ref >39)
LDL Calculated: 109 mg/dL — ABNORMAL HIGH (ref 0–99)
LDl/HDL Ratio: 2.1 ratio (ref 0.0–3.6)
Triglycerides: 66 mg/dL (ref 0–149)
VLDL Cholesterol Cal: 13 mg/dL (ref 5–40)

## 2017-04-11 LAB — T4, FREE: Free T4: 1.2 ng/dL (ref 0.82–1.77)

## 2017-04-11 LAB — TSH: TSH: 2.41 u[IU]/mL (ref 0.450–4.500)

## 2017-04-11 LAB — VITAMIN D 25 HYDROXY (VIT D DEFICIENCY, FRACTURES): Vit D, 25-Hydroxy: 59.3 ng/mL (ref 30.0–100.0)

## 2017-04-11 LAB — INSULIN, RANDOM: INSULIN: 13.4 u[IU]/mL (ref 2.6–24.9)

## 2017-04-23 ENCOUNTER — Encounter (INDEPENDENT_AMBULATORY_CARE_PROVIDER_SITE_OTHER): Payer: Self-pay | Admitting: Physician Assistant

## 2017-04-23 ENCOUNTER — Ambulatory Visit (INDEPENDENT_AMBULATORY_CARE_PROVIDER_SITE_OTHER): Payer: BLUE CROSS/BLUE SHIELD | Admitting: Physician Assistant

## 2017-04-23 VITALS — BP 123/76 | HR 74 | Temp 98.2°F | Ht 65.0 in | Wt 268.0 lb

## 2017-04-23 DIAGNOSIS — R7303 Prediabetes: Secondary | ICD-10-CM | POA: Diagnosis not present

## 2017-04-23 DIAGNOSIS — E7849 Other hyperlipidemia: Secondary | ICD-10-CM

## 2017-04-23 DIAGNOSIS — Z6841 Body Mass Index (BMI) 40.0 and over, adult: Secondary | ICD-10-CM | POA: Diagnosis not present

## 2017-04-23 MED ORDER — METFORMIN HCL 500 MG PO TABS: 500.0000 mg | ORAL_TABLET | Freq: Every day | ORAL | 0 refills | Status: DC

## 2017-04-23 MED ORDER — ATORVASTATIN CALCIUM 10 MG PO TABS: 10.0000 mg | ORAL_TABLET | Freq: Every day | ORAL | 0 refills | Status: DC

## 2017-04-23 NOTE — Progress Notes (Signed)
Office: [phone]  /  Fax: [phone]   HPI:   Chief Complaint: OBESITY James [name] is here to discuss his progress with his obesity treatment plan. He is on the Category 3 plan and is following his eating plan approximately 95 % of the time. He states he is walking for 60 minutes 1 times per week. James [name] continues to do well with weight loss. He incorporates variety with his meals. He states his hunger is well controlled.  His weight is 268 lb (121.6 kg) today and has had a weight loss of 2 pounds over a period of 2 weeks since his last visit. He has lost 37 lbs since starting treatment with [name].  Pre-Diabetes James [name] has a diagnosis of pre-diabetes based on his elevated Hgb A1c and was informed this puts him at greater risk of developing diabetes. His A1c has improved to 5.4. He is taking metformin currently and continues to work on diet and exercise to decrease risk of diabetes. He denies nausea or hypoglycemia.  Hyperlipidemia James [name] has hyperlipidemia and has been trying to improve his cholesterol levels with intensive lifestyle modification including a low saturated fat diet, exercise and weight loss. His LDL is not at goal and he denies any chest pain, claudication or myalgias.  ALLERGIES: No Known Allergies  MEDICATIONS: Current Outpatient Medications on File Prior to Visit  Medication Sig Dispense Refill  . Acetaminophen (TYLENOL PO) Take by mouth.    . cyclobenzaprine (FLEXERIL) 10 MG tablet Take 1 tablet (10 mg total) by mouth 3 (three) times daily as needed for muscle spasms. 30 tablet 0  . hydrochlorothiazide (HYDRODIURIL) 25 MG tablet TAKE 1 TABLET BY MOUTH EVERY DAY 90 tablet 1  . ibuprofen (ADVIL,MOTRIN) 200 MG tablet Take 200 mg by mouth every 6 (six) hours as needed.    Marland Kitchen lisinopril (PRINIVIL,ZESTRIL) 5 MG tablet TAKE 1 TABLET (5 MG TOTAL) BY MOUTH DAILY. 90 tablet 1  . Multiple Vitamins-Minerals (MULTIVITAMIN WITH MINERALS) tablet Take 1 tablet by mouth daily.    .  Vitamin D, Ergocalciferol, (DRISDOL) 50000 units CAPS capsule Take 1 capsule (50,000 Units total) by mouth every 7 (seven) days. 30 capsule 0   No current facility-administered medications on file prior to visit.     PAST MEDICAL HISTORY: Past Medical History:  Diagnosis Date  . Arthritis   . Chicken pox   . Hypertension   . Joint pain   . Obesity   . Osteoarthritis   . Prediabetes   . Swelling    feet and legs    PAST SURGICAL HISTORY: Past Surgical History:  Procedure Laterality Date  . cartilage removal    . KNEE SURGERY  2000    SOCIAL HISTORY: Social History   Tobacco Use  . Smoking status: Former Smoker    Last attempt to quit: 07/16/1992    Years since quitting: 24.7  . Smokeless tobacco: Never Used  Substance Use Topics  . Alcohol use: Yes    Comment: very little per patient   . Drug use: No    FAMILY HISTORY: Family History  Problem Relation Age of Onset  . Cancer Mother        lung and soft tissue cancer from radiation  . Stroke Mother   . Obesity Mother   . Heart disease Father        CHF  . Diabetes Father   . Hypertension Father   . Obesity Father   . Heart disease Maternal Grandfather   . Diabetes Paternal  Grandmother   . Ovarian cancer Sister     ROS: Review of Systems  Constitutional: Positive for weight loss.  Cardiovascular: Negative for chest pain and claudication.  Gastrointestinal: Negative for nausea.  Musculoskeletal: Negative for myalgias.  Endo/Heme/Allergies:       Negative hypoglycemia    PHYSICAL EXAM: Blood pressure 123/76, pulse 74, temperature 98.2 F (36.8 C), temperature source Oral, height 5\' 5"  (1.651 m), weight 268 lb (121.6 kg), SpO2 97 %. Body mass index is 44.6 kg/m. Physical Exam  Constitutional: He is oriented to person, place, and time. He appears well-developed and well-nourished.  Cardiovascular: Normal rate.  Pulmonary/Chest: Effort normal.  Musculoskeletal: Normal range of motion.  Neurological:  He is oriented to person, place, and time.  Skin: Skin is warm and dry.  Psychiatric: He has a normal mood and affect. His behavior is normal.  Vitals reviewed.   RECENT LABS AND TESTS: BMET    Component Value Date/Time   NA 138 04/10/2017 0833   K 4.3 04/10/2017 0833   CL 98 04/10/2017 0833   CO2 24 04/10/2017 0833   GLUCOSE 99 04/10/2017 0833   GLUCOSE 108 (H) 09/13/2015 0910   BUN 23 04/10/2017 0833   CREATININE 1.11 04/10/2017 0833   CALCIUM 9.3 04/10/2017 0833   GFRNONAA 70 04/10/2017 0833   GFRAA 81 04/10/2017 0833   Lab Results  Component Value Date   HGBA1C 5.4 04/10/2017   HGBA1C 5.6 01/01/2017   HGBA1C 5.7 (H) 09/04/2016   HGBA1C 5.9 09/13/2015   HGBA1C 5.8 07/17/2012   Lab Results  Component Value Date   INSULIN 13.4 04/10/2017   INSULIN 25.3 (H) 01/01/2017   INSULIN 17.2 09/04/2016   CBC    Component Value Date/Time   WBC 5.7 09/04/2016 1141   WBC 6.4 09/13/2015 0910   RBC 4.98 09/04/2016 1141   RBC 4.96 09/13/2015 0910   HGB 14.9 09/04/2016 1141   HCT 44.1 09/04/2016 1141   PLT 192 09/04/2016 1141   MCV 89 09/04/2016 1141   MCH 29.9 09/04/2016 1141   MCHC 33.8 09/04/2016 1141   MCHC 33.6 09/13/2015 0910   RDW 14.6 09/04/2016 1141   LYMPHSABS 1.6 09/04/2016 1141   MONOABS 0.5 09/13/2015 0910   EOSABS 0.0 09/04/2016 1141   BASOSABS 0.0 09/04/2016 1141   Iron/TIBC/Ferritin/ %Sat No results found for: IRON, TIBC, FERRITIN, IRONPCTSAT Lipid Panel     Component Value Date/Time   CHOL 174 04/10/2017 0833   TRIG 66 04/10/2017 0833   HDL 52 04/10/2017 0833   CHOLHDL 3 09/13/2015 0910   VLDL 14.2 09/13/2015 0910   LDLCALC 109 (H) 04/10/2017 0833   Hepatic Function Panel     Component Value Date/Time   PROT 7.0 04/10/2017 0833   ALBUMIN 4.5 04/10/2017 0833   AST 19 04/10/2017 0833   ALT 17 04/10/2017 0833   ALKPHOS 56 04/10/2017 0833   BILITOT 0.4 04/10/2017 0833   BILIDIR 0.1 11/14/2006 0928      Component Value Date/Time   TSH 2.410  04/10/2017 0833   TSH 1.830 09/04/2016 1141   TSH 1.42 11/30/2013 1318    ASSESSMENT AND PLAN: Prediabetes - Plan: metFORMIN (GLUCOPHAGE) 500 MG tablet  Other hyperlipidemia - Plan: atorvastatin (LIPITOR) 10 MG tablet  Class 3 severe obesity with serious comorbidity and body mass index (BMI) of 40.0 to 44.9 in adult, unspecified obesity type James [name])  PLAN:  Pre-Diabetes James [name] will continue to work on weight loss, exercise, and decreasing simple carbohydrates in his diet  to help decrease the risk of diabetes. We dicussed metformin including benefits and risks. He was informed that eating too many simple carbohydrates or too many calories at one sitting increases the likelihood of GI side effects. James [name] agrees to continue taking metformin 500 mg q AM #30 and we will refill for 1 month. James [name] agrees to follow up with our clinic in 3 weeks as directed to monitor his progress.  Hyperlipidemia James [name] was informed of the American Heart Association Guidelines emphasizing intensive lifestyle modifications as the first line treatment for hyperlipidemia. We discussed many lifestyle modifications today in depth, and James [name] will continue to work on decreasing saturated fats such as fatty red meat, butter and many fried foods. He will also increase vegetables and lean protein in his diet and continue to work on exercise and weight loss efforts. James [name] agrees to start Lipitor 10 mg qd #30 with no refills. James [name] agrees to follow up with our clinic in 3 weeks.  Obesity James [name] is currently in the action stage of change. As such, his goal is to continue with weight loss efforts He has agreed to follow the Category 3 plan James [name] has been instructed to work up to a goal of 150 minutes of combined cardio and strengthening exercise per week for weight loss and overall health benefits. We discussed the following Behavioral Modification Strategies today: increasing lean protein intake and work on meal planning and easy  cooking plans   James [name] has agreed to follow up with our clinic in 3 weeks. He was informed of the importance of frequent follow up visits to maximize his success with intensive lifestyle modifications for his multiple health conditions.  I, Trixie Dredge, am acting as transcriptionist for Lacy Duverney, PA-C  I have reviewed the above documentation for accuracy and completeness, and I agree with the above. -Lacy Duverney, PA-C  I have reviewed the above note and agree with the plan. -Dennard Nip, MD     Today's visit was # 15 out of 22.  Starting weight: 305 lbs Starting date: 09/04/16 Today's weight : 268 lbs  Today's date: 04/23/2017 Total lbs lost to date: 52 (Patients must lose 7 lbs in the first 6 months to continue with counseling)   ASK: We discussed the diagnosis of obesity with James [name] today and James [name] agreed to give [name] permission to discuss obesity behavioral modification therapy today.  ASSESS: James [name] has the diagnosis of obesity and his BMI today is 44.6 James [name] is in the action stage of change   ADVISE: James [name] was educated on the multiple health risks of obesity as well as the benefit of weight loss to improve his health. He was advised of the need for long term treatment and the importance of lifestyle modifications.  AGREE: Multiple dietary modification options and treatment options were discussed and  James [name] agreed to follow the Category 3 plan We discussed the following Behavioral Modification Strategies today: increasing lean protein intake and work on meal planning and easy cooking plans

## 2017-04-26 ENCOUNTER — Other Ambulatory Visit: Payer: Self-pay | Admitting: Adult Health

## 2017-05-02 ENCOUNTER — Other Ambulatory Visit: Payer: Self-pay | Admitting: Adult Health

## 2017-05-02 MED ORDER — CYCLOBENZAPRINE HCL 10 MG PO TABS
10.0000 mg | ORAL_TABLET | Freq: Three times a day (TID) | ORAL | 0 refills | Status: DC | PRN
Start: 2017-05-02 — End: 2017-06-25

## 2017-05-02 NOTE — Telephone Encounter (Signed)
You last prescribed 08/10/16.  Originally filled by Dr. Maudie Mercury.  Pt due for yearly 09/2017. Please advise.

## 2017-05-02 NOTE — Telephone Encounter (Signed)
Ok to refill for 30 days  

## 2017-05-02 NOTE — Telephone Encounter (Signed)
Sent in

## 2017-05-02 NOTE — Telephone Encounter (Signed)
Ok to refill, 30 pills, 0 refills

## 2017-05-02 NOTE — Telephone Encounter (Signed)
Sent to the pharmacy by e-scribe. 

## 2017-05-03 NOTE — Telephone Encounter (Signed)
DUPLICATE REQUEST.  SENT IN ELECTRONICALLY BY [facility] ON 05/02/17

## 2017-05-15 ENCOUNTER — Ambulatory Visit (INDEPENDENT_AMBULATORY_CARE_PROVIDER_SITE_OTHER): Payer: BLUE CROSS/BLUE SHIELD | Admitting: Physician Assistant

## 2017-05-15 VITALS — BP 117/77 | HR 85 | Temp 97.7°F | Ht 65.0 in | Wt 269.0 lb

## 2017-05-15 DIAGNOSIS — E7849 Other hyperlipidemia: Secondary | ICD-10-CM

## 2017-05-15 DIAGNOSIS — Z6841 Body Mass Index (BMI) 40.0 and over, adult: Secondary | ICD-10-CM | POA: Diagnosis not present

## 2017-05-15 NOTE — Progress Notes (Addendum)
Office: [phone]  /  Fax: [phone]   HPI:   Chief Complaint: OBESITY James [name] is here to discuss his progress with his obesity treatment plan. He is on the Category 3 plan and is following his eating plan approximately 95 % of the time. He states he is exercising 0 minutes 0 times per week. James [name] has deviated with his eating over the holidays. He is motivated to get back on track and continue with weight loss.  His weight is 269 lb (122 kg) today and has gained 1 pound since his last visit. He has lost 36 lbs since starting treatment with [name].  Hyperlipidemia James [name] has hyperlipidemia and has been trying to improve his cholesterol levels with intensive lifestyle modification including a low saturated fat diet, exercise and weight loss. James [name] was on Lipitor and states he stopped taking it due to body aches and some light headaches. He declines any other medications for hyperlipidemia. He denies any chest pain, claudication or myalgias.  ALLERGIES: Allergies  Allergen Reactions  . Lipitor [Atorvastatin] Other (See Comments)    Lightheaded    MEDICATIONS: Current Outpatient Medications on File Prior to Visit  Medication Sig Dispense Refill  . Acetaminophen (TYLENOL PO) Take by mouth.    . cyclobenzaprine (FLEXERIL) 10 MG tablet Take 1 tablet (10 mg total) by mouth 3 (three) times daily as needed for muscle spasms. 30 tablet 0  . hydrochlorothiazide (HYDRODIURIL) 25 MG tablet TAKE 1 TABLET BY MOUTH EVERY DAY 90 tablet 1  . ibuprofen (ADVIL,MOTRIN) 200 MG tablet Take 200 mg by mouth every 6 (six) hours as needed.    Marland Kitchen lisinopril (PRINIVIL,ZESTRIL) 5 MG tablet TAKE 1 TABLET (5 MG TOTAL) BY MOUTH DAILY. 90 tablet 1  . metFORMIN (GLUCOPHAGE) 500 MG tablet Take 1 tablet (500 mg total) by mouth daily with breakfast. 30 tablet 0  . Multiple Vitamins-Minerals (MULTIVITAMIN WITH MINERALS) tablet Take 1 tablet by mouth daily.    . Vitamin D, Ergocalciferol, (DRISDOL) 50000 units CAPS capsule  Take 1 capsule (50,000 Units total) by mouth every 7 (seven) days. 30 capsule 0   No current facility-administered medications on file prior to visit.     PAST MEDICAL HISTORY: Past Medical History:  Diagnosis Date  . Arthritis   . Chicken pox   . Hypertension   . Joint pain   . Obesity   . Osteoarthritis   . Prediabetes   . Swelling    feet and legs    PAST SURGICAL HISTORY: Past Surgical History:  Procedure Laterality Date  . cartilage removal    . KNEE SURGERY  2000    SOCIAL HISTORY: Social History   Tobacco Use  . Smoking status: Former Smoker    Last attempt to quit: 07/16/1992    Years since quitting: 24.8  . Smokeless tobacco: Never Used  Substance Use Topics  . Alcohol use: Yes    Comment: very little per patient   . Drug use: No    FAMILY HISTORY: Family History  Problem Relation Age of Onset  . Cancer Mother        lung and soft tissue cancer from radiation  . Stroke Mother   . Obesity Mother   . Heart disease Father        CHF  . Diabetes Father   . Hypertension Father   . Obesity Father   . Heart disease Maternal Grandfather   . Diabetes Paternal Grandmother   . Ovarian cancer Sister     ROS:  Review of Systems  Constitutional: Negative for weight loss.  Cardiovascular: Negative for chest pain and claudication.  Musculoskeletal: Negative for myalgias.    PHYSICAL EXAM: Blood pressure 117/77, pulse 85, temperature 97.7 F (36.5 C), temperature source Oral, height 5\' 5"  (1.651 m), weight 269 lb (122 kg), SpO2 96 %. Body mass index is 44.76 kg/m. Physical Exam  Constitutional: He is oriented to person, place, and time. He appears well-developed and well-nourished.  Cardiovascular: Normal rate.  Pulmonary/Chest: Effort normal.  Musculoskeletal: Normal range of motion.  Neurological: He is oriented to person, place, and time.  Skin: Skin is warm and dry.  Psychiatric: He has a normal mood and affect. His behavior is normal.  Vitals  reviewed.   RECENT LABS AND TESTS: BMET    Component Value Date/Time   NA 138 04/10/2017 0833   K 4.3 04/10/2017 0833   CL 98 04/10/2017 0833   CO2 24 04/10/2017 0833   GLUCOSE 99 04/10/2017 0833   GLUCOSE 108 (H) 09/13/2015 0910   BUN 23 04/10/2017 0833   CREATININE 1.11 04/10/2017 0833   CALCIUM 9.3 04/10/2017 0833   GFRNONAA 70 04/10/2017 0833   GFRAA 81 04/10/2017 0833   Lab Results  Component Value Date   HGBA1C 5.4 04/10/2017   HGBA1C 5.6 01/01/2017   HGBA1C 5.7 (H) 09/04/2016   HGBA1C 5.9 09/13/2015   HGBA1C 5.8 07/17/2012   Lab Results  Component Value Date   INSULIN 13.4 04/10/2017   INSULIN 25.3 (H) 01/01/2017   INSULIN 17.2 09/04/2016   CBC    Component Value Date/Time   WBC 5.7 09/04/2016 1141   WBC 6.4 09/13/2015 0910   RBC 4.98 09/04/2016 1141   RBC 4.96 09/13/2015 0910   HGB 14.9 09/04/2016 1141   HCT 44.1 09/04/2016 1141   PLT 192 09/04/2016 1141   MCV 89 09/04/2016 1141   MCH 29.9 09/04/2016 1141   MCHC 33.8 09/04/2016 1141   MCHC 33.6 09/13/2015 0910   RDW 14.6 09/04/2016 1141   LYMPHSABS 1.6 09/04/2016 1141   MONOABS 0.5 09/13/2015 0910   EOSABS 0.0 09/04/2016 1141   BASOSABS 0.0 09/04/2016 1141   Iron/TIBC/Ferritin/ %Sat No results found for: IRON, TIBC, FERRITIN, IRONPCTSAT Lipid Panel     Component Value Date/Time   CHOL 174 04/10/2017 0833   TRIG 66 04/10/2017 0833   HDL 52 04/10/2017 0833   CHOLHDL 3 09/13/2015 0910   VLDL 14.2 09/13/2015 0910   LDLCALC 109 (H) 04/10/2017 0833   Hepatic Function Panel     Component Value Date/Time   PROT 7.0 04/10/2017 0833   ALBUMIN 4.5 04/10/2017 0833   AST 19 04/10/2017 0833   ALT 17 04/10/2017 0833   ALKPHOS 56 04/10/2017 0833   BILITOT 0.4 04/10/2017 0833   BILIDIR 0.1 11/14/2006 0928      Component Value Date/Time   TSH 2.410 04/10/2017 0833   TSH 1.830 09/04/2016 1141   TSH 1.42 11/30/2013 1318    ASSESSMENT AND PLAN: Other hyperlipidemia  Class 3 severe obesity with  serious comorbidity and body mass index (BMI) of 40.0 to 44.9 in adult, unspecified obesity type (HCC)  PLAN:  Hyperlipidemia James [name] was informed of the American Heart Association Guidelines emphasizing intensive lifestyle modifications as the first line treatment for hyperlipidemia. We discussed many lifestyle modifications today in depth, and James [name] will continue to work on decreasing saturated fats such as fatty red meat, butter and many fried foods. He will also increase vegetables and lean protein in his diet  and continue to work on diet, exercise, and weight loss efforts.  We spent > than 50% of the 15 minute visit on the counseling as documented in the note.  Obesity James [name] is currently in the action stage of change. As such, his goal is to continue with weight loss efforts He has agreed to follow the Category 3 plan James [name] has been instructed to work up to a goal of 150 minutes of combined cardio and strengthening exercise per week for weight loss and overall health benefits. We discussed the following Behavioral Modification Strategies today: increasing lean protein intake and planning for success   James [name] has agreed to follow up with our clinic in 2 weeks. He was informed of the importance of frequent follow up visits to maximize his success with intensive lifestyle modifications for his multiple health conditions.   OBESITY BEHAVIORAL INTERVENTION VISIT  Today's visit was # 16 out of 22.  Starting weight: 305 lbs Starting date: 09/04/16 Today's weight : 269 lbs  Today's date: 05/15/2017 Total lbs lost to date: 66 (Patients must lose 7 lbs in the first 6 months to continue with counseling)   ASK: We discussed the diagnosis of obesity with James [name] today and James [name] agreed to give [name] permission to discuss obesity behavioral modification therapy today.  ASSESS: James [name] has the diagnosis of obesity and his BMI today is 44.76 James [name] is in the action stage of change    ADVISE: Bliss was educated on the multiple health risks of obesity as well as the benefit of weight loss to improve his health. He was advised of the need for long term treatment and the importance of lifestyle modifications.  AGREE: Multiple dietary modification options and treatment options were discussed and  James [name] agreed to the above obesity treatment plan.  James [name], am acting as transcriptionist for Lacy Duverney, [facility] have reviewed this note and agree with its contents  I have reviewed the above documentation for accuracy and completeness, and I agree with the above. -Dennard Nip, MD

## 2017-05-20 ENCOUNTER — Other Ambulatory Visit (INDEPENDENT_AMBULATORY_CARE_PROVIDER_SITE_OTHER): Payer: Self-pay | Admitting: Family Medicine

## 2017-05-20 DIAGNOSIS — E7849 Other hyperlipidemia: Secondary | ICD-10-CM

## 2017-05-29 ENCOUNTER — Encounter (INDEPENDENT_AMBULATORY_CARE_PROVIDER_SITE_OTHER): Payer: Self-pay | Admitting: Physician Assistant

## 2017-05-29 ENCOUNTER — Ambulatory Visit (INDEPENDENT_AMBULATORY_CARE_PROVIDER_SITE_OTHER): Payer: BLUE CROSS/BLUE SHIELD | Admitting: Physician Assistant

## 2017-05-29 VITALS — BP 131/75 | HR 75 | Temp 97.9°F | Ht 65.0 in | Wt 271.0 lb

## 2017-05-29 DIAGNOSIS — I10 Essential (primary) hypertension: Secondary | ICD-10-CM

## 2017-05-29 DIAGNOSIS — Z6841 Body Mass Index (BMI) 40.0 and over, adult: Secondary | ICD-10-CM

## 2017-05-29 DIAGNOSIS — R7303 Prediabetes: Secondary | ICD-10-CM | POA: Diagnosis not present

## 2017-05-29 MED ORDER — METFORMIN HCL 500 MG PO TABS: 500.0000 mg | ORAL_TABLET | Freq: Every day | ORAL | 0 refills | Status: DC

## 2017-05-29 NOTE — Progress Notes (Addendum)
Office: [phone]  /  Fax: [phone]   HPI:   Chief Complaint: OBESITY James [name] is here to discuss his progress with his obesity treatment [name]. He is on James Category 3 [name] and is following his eating [name] approximately 95 % of James time. He states he is exercising 0 minutes 0 times per week. Mat states he has not been feeling well and has been skipping some meals. He would like more convenient ways of getting his protein. His weight is 271 lb (122.9 kg) today and has had a weight gain of 2 pounds over a period of 2 weeks since his last visit. He has lost 34 lbs since starting treatment with [name].  Pre-Diabetes James [name] has a diagnosis of pre-diabetes based on his elevated Hgb A1c and was informed this puts him at greater risk of developing diabetes. He is taking metformin currently and continues to work on diet and exercise to decrease risk of diabetes. He denies nausea, polyphagia or hypoglycemia.  Hypertension James [name] is a 62 y.o. male with hypertension. James [name] denies chest pain or shortness of breath on exertion. He is working weight loss to help control his blood pressure with James goal of decreasing his risk of heart attack and stroke. James [name] blood pressure is currently stable.  ALLERGIES: Allergies  Allergen Reactions  . Lipitor [Atorvastatin] Other (See Comments)    Lightheaded    MEDICATIONS: Current Outpatient Medications on File Prior to Visit  Medication Sig Dispense Refill  . Acetaminophen (TYLENOL PO) Take by mouth.    . cyclobenzaprine (FLEXERIL) 10 MG tablet Take 1 tablet (10 mg total) by mouth 3 (three) times daily as needed for muscle spasms. 30 tablet 0  . hydrochlorothiazide (HYDRODIURIL) 25 MG tablet TAKE 1 TABLET BY MOUTH EVERY DAY 90 tablet 1  . ibuprofen (ADVIL,MOTRIN) 200 MG tablet Take 200 mg by mouth every 6 (six) hours as needed.    Marland Kitchen lisinopril (PRINIVIL,ZESTRIL) 5 MG tablet TAKE 1 TABLET (5 MG TOTAL) BY MOUTH DAILY. 90 tablet 1  .  metFORMIN (GLUCOPHAGE) 500 MG tablet Take 1 tablet (500 mg total) by mouth daily with breakfast. 30 tablet 0  . Multiple Vitamins-Minerals (MULTIVITAMIN WITH MINERALS) tablet Take 1 tablet by mouth daily.    . Vitamin D, Ergocalciferol, (DRISDOL) 50000 units CAPS capsule Take 1 capsule (50,000 Units total) by mouth every 7 (seven) days. 30 capsule 0   No current facility-administered medications on file prior to visit.     PAST MEDICAL HISTORY: Past Medical History:  Diagnosis Date  . Arthritis   . Chicken pox   . Hypertension   . Joint pain   . Obesity   . Osteoarthritis   . Prediabetes   . Swelling    feet and legs    PAST SURGICAL HISTORY: Past Surgical History:  Procedure Laterality Date  . cartilage removal    . KNEE SURGERY  2000    SOCIAL HISTORY: Social History   Tobacco Use  . Smoking status: Former Smoker    Last attempt to quit: 07/16/1992    Years since quitting: 24.8  . Smokeless tobacco: Never Used  Substance Use Topics  . Alcohol use: Yes    Comment: very little per patient   . Drug use: No    FAMILY HISTORY: Family History  Problem Relation Age of Onset  . Cancer Mother        lung and soft tissue cancer from radiation  . Stroke Mother   . Obesity  Mother   . Heart disease Father        CHF  . Diabetes Father   . Hypertension Father   . Obesity Father   . Heart disease Maternal Grandfather   . Diabetes Paternal Grandmother   . Ovarian cancer Sister     ROS: Review of Systems  Constitutional: Negative for weight loss.  Respiratory: Negative for shortness of breath (on exertion).   Cardiovascular: Negative for chest pain.  Gastrointestinal: Negative for nausea.  Endo/Heme/Allergies:       Negative polyphagia Negative hypoglycemia    PHYSICAL EXAM: Blood pressure 131/75, pulse 75, temperature 97.9 F (36.6 C), temperature source Oral, height 5\' 5"  (1.651 m), weight 271 lb (122.9 kg), SpO2 98 %. Body mass index is 45.1  kg/m. Physical Exam  Constitutional: He is oriented to person, place, and time. He appears well-developed and well-nourished.  Cardiovascular: Normal rate.  Pulmonary/Chest: Effort normal.  Musculoskeletal: Normal range of motion.  Neurological: He is oriented to person, place, and time.  Skin: Skin is warm and dry.  Psychiatric: He has a normal mood and affect. His behavior is normal.  Vitals reviewed.   RECENT LABS AND TESTS: BMET    Component Value Date/Time   NA 138 04/10/2017 0833   K 4.3 04/10/2017 0833   CL 98 04/10/2017 0833   CO2 24 04/10/2017 0833   GLUCOSE 99 04/10/2017 0833   GLUCOSE 108 (H) 09/13/2015 0910   BUN 23 04/10/2017 0833   CREATININE 1.11 04/10/2017 0833   CALCIUM 9.3 04/10/2017 0833   GFRNONAA 70 04/10/2017 0833   GFRAA 81 04/10/2017 0833   Lab Results  Component Value Date   HGBA1C 5.4 04/10/2017   HGBA1C 5.6 01/01/2017   HGBA1C 5.7 (H) 09/04/2016   HGBA1C 5.9 09/13/2015   HGBA1C 5.8 07/17/2012   Lab Results  Component Value Date   INSULIN 13.4 04/10/2017   INSULIN 25.3 (H) 01/01/2017   INSULIN 17.2 09/04/2016   CBC    Component Value Date/Time   WBC 5.7 09/04/2016 1141   WBC 6.4 09/13/2015 0910   RBC 4.98 09/04/2016 1141   RBC 4.96 09/13/2015 0910   HGB 14.9 09/04/2016 1141   HCT 44.1 09/04/2016 1141   PLT 192 09/04/2016 1141   MCV 89 09/04/2016 1141   MCH 29.9 09/04/2016 1141   MCHC 33.8 09/04/2016 1141   MCHC 33.6 09/13/2015 0910   RDW 14.6 09/04/2016 1141   LYMPHSABS 1.6 09/04/2016 1141   MONOABS 0.5 09/13/2015 0910   EOSABS 0.0 09/04/2016 1141   BASOSABS 0.0 09/04/2016 1141   Iron/TIBC/Ferritin/ %Sat No results found for: IRON, TIBC, FERRITIN, IRONPCTSAT Lipid Panel     Component Value Date/Time   CHOL 174 04/10/2017 0833   TRIG 66 04/10/2017 0833   HDL 52 04/10/2017 0833   CHOLHDL 3 09/13/2015 0910   VLDL 14.2 09/13/2015 0910   LDLCALC 109 (H) 04/10/2017 0833   Hepatic Function Panel     Component Value  Date/Time   PROT 7.0 04/10/2017 0833   ALBUMIN 4.5 04/10/2017 0833   AST 19 04/10/2017 0833   ALT 17 04/10/2017 0833   ALKPHOS 56 04/10/2017 0833   BILITOT 0.4 04/10/2017 0833   BILIDIR 0.1 11/14/2006 0928      Component Value Date/Time   TSH 2.410 04/10/2017 0833   TSH 1.830 09/04/2016 1141   TSH 1.42 11/30/2013 1318    ASSESSMENT AND [name]: Prediabetes - [name]: metFORMIN (GLUCOPHAGE) 500 MG tablet  Essential hypertension  Class 3 severe  obesity with serious comorbidity and body mass index (BMI) of 45.0 to 49.9 in adult, unspecified obesity type James [name])  [name]:  Pre-Diabetes James [name] will continue to work on weight loss, exercise, and decreasing simple carbohydrates in his diet to help decrease James risk of diabetes. We dicussed metformin including benefits and risks. He was informed that eating too many simple carbohydrates or too many calories at one sitting increases James likelihood of GI side effects. James [name] requested metformin for now and a prescription was written today for 1 month refill. James [name] agreed to follow up with [name] as directed to monitor his progress.  Hypertension We discussed sodium restriction, working on healthy weight loss, and a regular exercise program as James means to achieve improved blood pressure control. James [name] agreed with this [name] and agreed to follow up as directed. We will continue to monitor his blood pressure as well as his progress with James above lifestyle modifications. He will continue his medications as prescribed and will watch for signs of hypotension as he continues his lifestyle modifications.  Obesity James [name] is currently in James action stage of change. As such, his goal is to continue with weight loss efforts He has agreed to follow James Category 3 [name] James [name] has been instructed to work up to a goal of 150 minutes of combined cardio and strengthening exercise per week for weight loss and overall health benefits. We discussed James following Behavioral  Modification Strategies today: increasing lean protein intake and work on meal planning and easy cooking plans  James [name] has agreed to follow up with our clinic in 3 weeks. He was informed of James importance of frequent follow up visits to maximize his success with intensive lifestyle modifications for his multiple health conditions.   OBESITY BEHAVIORAL INTERVENTION VISIT  Today's visit was # 17 out of 22.  Starting weight: 305 lbs Starting date: 09/04/16 Today's weight : 271 lbs Today's date: 05/29/2017 Total lbs lost to date: 67 (Patients must lose 7 lbs in James first 6 months to continue with counseling)   ASK: We discussed James diagnosis of obesity with James [name] today and James [name] agreed to give [name] permission to discuss obesity behavioral modification therapy today.  ASSESS: James [name] has James diagnosis of obesity and his BMI today is 45.1 James [name] is in James action stage of change   ADVISE: James [name] was educated on James multiple health risks of obesity as well as James benefit of weight loss to improve his health. He was advised of James need for long term treatment and James importance of lifestyle modifications.  AGREE: Multiple dietary modification options and treatment options were discussed and  James [name] agreed to James [name].   James [name], am acting as transcriptionist for [employer], PA-C I, Lacy Duverney [facility], have reviewed this note and agree with its contents.

## 2017-05-31 ENCOUNTER — Ambulatory Visit: Payer: BLUE CROSS/BLUE SHIELD | Admitting: Adult Health

## 2017-05-31 ENCOUNTER — Encounter: Payer: Self-pay | Admitting: Adult Health

## 2017-05-31 ENCOUNTER — Other Ambulatory Visit: Payer: Self-pay | Admitting: Adult Health

## 2017-05-31 VITALS — BP 140/84 | Temp 98.6°F | Wt 281.0 lb

## 2017-05-31 DIAGNOSIS — R519 Headache, unspecified: Secondary | ICD-10-CM

## 2017-05-31 DIAGNOSIS — R109 Unspecified abdominal pain: Secondary | ICD-10-CM | POA: Diagnosis not present

## 2017-05-31 DIAGNOSIS — R0789 Other chest pain: Secondary | ICD-10-CM

## 2017-05-31 DIAGNOSIS — R51 Headache: Secondary | ICD-10-CM

## 2017-05-31 DIAGNOSIS — M5412 Radiculopathy, cervical region: Secondary | ICD-10-CM | POA: Diagnosis not present

## 2017-05-31 LAB — CBC WITH DIFFERENTIAL/PLATELET
Basophils Absolute: 29 cells/uL (ref 0–200)
Basophils Relative: 0.4 %
Eosinophils Absolute: 29 cells/uL (ref 15–500)
Eosinophils Relative: 0.4 %
HCT: 42.7 % (ref 38.5–50.0)
Hemoglobin: 14.6 g/dL (ref 13.2–17.1)
Lymphs Abs: 1930 cells/uL (ref 850–3900)
MCH: 29.6 pg (ref 27.0–33.0)
MCHC: 34.2 g/dL (ref 32.0–36.0)
MCV: 86.4 fL (ref 80.0–100.0)
MPV: 11.6 fL (ref 7.5–12.5)
Monocytes Relative: 7.1 %
Neutro Abs: 4702 cells/uL (ref 1500–7800)
Neutrophils Relative %: 65.3 %
Platelets: 202 10*3/uL (ref 140–400)
RBC: 4.94 10*6/uL (ref 4.20–5.80)
RDW: 12.7 % (ref 11.0–15.0)
Total Lymphocyte: 26.8 %
WBC mixed population: 511 cells/uL (ref 200–950)
WBC: 7.2 10*3/uL (ref 3.8–10.8)

## 2017-05-31 LAB — POCT URINALYSIS DIPSTICK
Bilirubin, UA: NEGATIVE
Blood, UA: NEGATIVE
Glucose, UA: NEGATIVE
Ketones, UA: NEGATIVE
Leukocytes, UA: NEGATIVE
Nitrite, UA: NEGATIVE
Protein, UA: NEGATIVE
Spec Grav, UA: 1.025 (ref 1.010–1.025)
Urobilinogen, UA: 0.2 E.U./dL
pH, UA: 6 (ref 5.0–8.0)

## 2017-05-31 LAB — BASIC METABOLIC PANEL
BUN/Creatinine Ratio: 35 (calc) — ABNORMAL HIGH (ref 6–22)
BUN: 32 mg/dL — ABNORMAL HIGH (ref 7–25)
CO2: 26 mmol/L (ref 20–32)
Calcium: 9.4 mg/dL (ref 8.6–10.3)
Chloride: 102 mmol/L (ref 98–110)
Creat: 0.91 mg/dL (ref 0.70–1.25)
Glucose, Bld: 101 mg/dL — ABNORMAL HIGH (ref 65–99)
Potassium: 4.2 mmol/L (ref 3.5–5.3)
Sodium: 139 mmol/L (ref 135–146)

## 2017-05-31 MED ORDER — METHYLPREDNISOLONE 4 MG PO TBPK: ORAL_TABLET | ORAL | 0 refills | Status: DC

## 2017-05-31 NOTE — Patient Instructions (Signed)
It was great seeing you today   I have prescribed a medrol dose pack ( steroid) for you to take   Use the Flexeril you have on hand, at bedtime while you are taking the prednisone   I will follow up with you regarding your labs   Please follow up with me next week

## 2017-05-31 NOTE — Progress Notes (Signed)
Subjective:    Patient ID: James [name], male    DOB: 30-Oct-1955, 62 y.o.   MRN: [id number]  HPI  62 year old male who  has a past medical history of Arthritis, Chicken pox, Hypertension, Joint pain, Obesity, Osteoarthritis, Prediabetes, and Swelling.  He presents to the office today with multiple complaints.    He reports a 2-3-week history of left frontal headaches that are occurring on a daily basis.  He reports that his headaches are lasting no more than 1 hour.  Takes to cause some sensitivity to sound, but no sensitivity to light.  He denies any nausea or vomiting during the headaches With these headaches comes episodes of blurred vision, again these are ending daily but the blurred vision only last for a few seconds to a few minutes. Patient states "it feels like I have cobwebs in the head and I need to shake them out".  No prior history of headaches  He also reports that he has been experiencing pain in his left shoulder blade that radiates down his left arm.  Is been an ongoing occurrence for the last 3 weeks as well.  Discomfort is described as "numbness and tingling".  Denies any decrease in range of motion or arm/grip strength.  In addition he is also been experiencing episodic tightness in his chest for 3 weeks.  This tightness in his chest is located throughout the chest and is not fixated on one spot.  He denies any shortness of breath, chest pain, palpitations or radiating pain up his jawline.  Tightness the chest is not happening every day and does not happen at specific times of the day   Complains of left flank/abdominal  pain not associated with change in bowel habits, dysuria, hematuria.  He does report more frequent urination and odor to urine.  Has a history of one kidney stone, multiple years ago.  Denies any history of UTIs.   Denies feeling acutely ill and his symptoms do not interfere with his activities of daily living.  He would also like to have a letter for jury  duty as he is unable to sit for extended periods of time or walk more than 200 feet without stopping this is due to severe osteoarthritis    Review of Systems  HENT: Negative.   Eyes: Positive for photophobia and visual disturbance. Negative for pain, discharge, redness and itching.  Respiratory: Positive for chest tightness. Negative for shortness of breath.   Cardiovascular: Negative.   Gastrointestinal: Positive for abdominal pain (left flank).  Genitourinary: Positive for frequency. Negative for dysuria and hematuria.       Odorous urine    Musculoskeletal: Positive for myalgias.  Skin: Negative.   Neurological: Positive for numbness and headaches. Negative for dizziness, tremors, seizures, syncope, facial asymmetry, speech difficulty, weakness and light-headedness.  Psychiatric/Behavioral: Negative.      Past Medical History:  Diagnosis Date  . Arthritis   . Chicken pox   . Hypertension   . Joint pain   . Obesity   . Osteoarthritis   . Prediabetes   . Swelling    feet and legs    Social History   Socioeconomic History  . Marital status: Married    Spouse name: Not on file  . Number of children: Not on file  . Years of education: Not on file  . Highest education level: Not on file  Social Needs  . Financial resource strain: Not on file  . Food insecurity -  worry: Not on file  . Food insecurity - inability: Not on file  . Transportation needs - medical: Not on file  . Transportation needs - non-medical: Not on file  Occupational History  . Occupation: At home caregiver  Tobacco Use  . Smoking status: Former Smoker    Last attempt to quit: 07/16/1992    Years since quitting: 24.8  . Smokeless tobacco: Never Used  Substance and Sexual Activity  . Alcohol use: Yes    Comment: very little per patient   . Drug use: No  . Sexual activity: Not on file  Other Topics Concern  . 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 cartilage removal    . KNEE SURGERY  2000    Family History  Problem Relation Age of Onset  . Cancer Mother        lung and soft tissue cancer from radiation  . Stroke Mother   . Obesity Mother   . Heart disease Father        CHF  . Diabetes Father   . Hypertension Father   . Obesity Father   . Heart disease Maternal Grandfather   . Diabetes Paternal Grandmother   . Ovarian cancer Sister     Allergies  Allergen Reactions  . Lipitor [Atorvastatin] Other (See Comments)    Lightheaded    Current Outpatient Medications on File Prior to Visit  Medication Sig Dispense Refill  . Acetaminophen (TYLENOL PO) Take by mouth.    . cyclobenzaprine (FLEXERIL) 10 MG tablet Take 1 tablet (10 mg total) by mouth 3 (three) times daily as needed for muscle spasms. 30 tablet 0  . hydrochlorothiazide (HYDRODIURIL) 25 MG tablet TAKE 1 TABLET BY MOUTH EVERY DAY 90 tablet 1  . ibuprofen (ADVIL,MOTRIN) 200 MG tablet Take 200 mg by mouth every 6 (six) hours as needed.    Marland Kitchen lisinopril (PRINIVIL,ZESTRIL) 5 MG tablet TAKE 1 TABLET (5 MG TOTAL) BY MOUTH DAILY. 90 tablet 1  . metFORMIN (GLUCOPHAGE) 500 MG tablet Take 1 tablet (500 mg total) by mouth daily with breakfast. 30 tablet 0  . Multiple Vitamins-Minerals (MULTIVITAMIN WITH MINERALS) tablet Take 1 tablet by mouth daily.    . Vitamin D, Ergocalciferol, (DRISDOL) 50000 units CAPS capsule Take 1 capsule (50,000 Units total) by mouth every 7 (seven) days. (Patient taking differently: Take 50,000 Units by mouth every 14 (fourteen) days. ) 30 capsule 0   No current facility-administered medications on file prior to visit.     BP 140/84 (BP Location: Left Arm)   Temp 98.6 F (37 C) (Oral)   Wt 281 lb  (127.5 kg)   BMI 46.76 kg/m       Objective:   Physical Exam  Constitutional: He is oriented to person, place, and time. He appears well-developed and well-nourished. No distress.  Eyes: Conjunctivae and EOM are normal. Pupils are equal, round, and reactive to light. Right eye exhibits no discharge. Left eye exhibits no discharge. No scleral icterus.  Neck: Normal range of motion. Neck supple. No JVD present. No tracheal deviation present. No thyromegaly present.  Cardiovascular: Normal rate, regular rhythm, normal heart sounds and intact distal pulses. Exam reveals no gallop and no friction rub.  No murmur heard. Pulmonary/Chest: Effort normal and breath sounds normal. No stridor. No respiratory distress. He has no wheezes. He has no rales. He exhibits no tenderness.  Abdominal: Soft. Normal appearance and bowel sounds are normal. He exhibits no distension and no mass. There is no tenderness. There is no rebound, no guarding and no CVA tenderness.  Musculoskeletal: He exhibits tenderness. He exhibits no edema or deformity.       Left shoulder: He exhibits tenderness (To left shoulder blade and trapezius), bony tenderness (Throughout shoulder) and pain. He exhibits normal range of motion, no swelling, no effusion, no crepitus, no deformity, no spasm, normal pulse and normal strength.  Lymphadenopathy:    He has no cervical adenopathy.  Neurological: He is alert and oriented to person, place, and time. He has normal strength and normal reflexes. He displays normal reflexes. No cranial nerve deficit or sensory deficit. He exhibits normal muscle tone. Coordination normal. GCS eye subscore is 4. GCS verbal subscore is 5. GCS motor subscore is 6.  Smile is symmetrical, tongue is midline.  Normal and equal sensation to touch. Strength in bilateral upper and lower extremities is 5/5.  Normal finger to nose  Skin: Skin is warm and dry. No rash noted. He is not diaphoretic. No erythema. No pallor.    Psychiatric: He has a normal mood and affect. His behavior is normal. Thought content normal.  Nursing note and vitals reviewed.      Assessment & Plan:  1. Chest tightness -Possibly related to cervical radiculopathy.  He is going to follow-up in 1 week, if no improvement then consider chemical stress test and/or referral to cardiology - EKG 12-Lead= sinus rhythm with a heart rate of 75.  EKG TURP rotation shows left axis, anterior fascicular block, it appears that this is a mistake with the EKG reading, is not noticed on visual rotation. - Basic metabolic panel - CBC with Differential/Platelet  2. Left flank pain -Consider UTI or kidney stone.  Will check urine and get KUB if urine is negative - POC Urinalysis Dipstick - Basic metabolic panel - CBC with Differential/Platelet  3. Cervical radiculopathy -No decreased range of motion but does have pain with palpation to left shoulder and trapezius.  He has Flexeril at home which will have him take nightly addition to the Medrol Dosepak. -Follow-up in 1 week - methylPREDNISolone (MEDROL DOSEPAK) 4 MG TBPK tablet; Take as directed  Dispense: 21 tablet; Refill: 0 - EKG [id number] - Basic metabolic panel - CBC with Differential/Platelet  4. Acute nonintractable headache, unspecified headache type -Possibly due to cervical radiculopathy.  We will see how he responds to Medrol Dosepak and Flexeril - Basic metabolic panel - CBC with Differential/Platelet  Dorothyann Peng, NP

## 2017-06-02 ENCOUNTER — Encounter: Payer: Self-pay | Admitting: Adult Health

## 2017-06-03 ENCOUNTER — Ambulatory Visit (INDEPENDENT_AMBULATORY_CARE_PROVIDER_SITE_OTHER)
Admission: RE | Admit: 2017-06-03 | Discharge: 2017-06-03 | Disposition: A | Discharge status: Home / Self Care | Payer: BLUE CROSS/BLUE SHIELD | Source: Ambulatory Visit | Attending: Adult Health | Admitting: Adult Health

## 2017-06-03 DIAGNOSIS — R109 Unspecified abdominal pain: Secondary | ICD-10-CM

## 2017-06-06 ENCOUNTER — Encounter: Payer: Self-pay | Admitting: Adult Health

## 2017-06-06 ENCOUNTER — Ambulatory Visit: Payer: BLUE CROSS/BLUE SHIELD | Admitting: Adult Health

## 2017-06-06 VITALS — BP 98/62 | HR 87 | Temp 97.6°F | Ht 65.0 in | Wt 276.8 lb

## 2017-06-06 DIAGNOSIS — M25552 Pain in left hip: Secondary | ICD-10-CM | POA: Diagnosis not present

## 2017-06-06 NOTE — Progress Notes (Signed)
Subjective:    Patient ID: James [name], male    DOB: [birth year], 62 y.o.   MRN: [id number]  HPI  62 year old male who  has a past medical history of Arthritis, Chicken pox, Hypertension, Joint pain, Obesity, Osteoarthritis, Prediabetes, and Swelling.   He presents to the office today for one week follow up regarding frontal headaches, left shoulder pain with numbness and tingling down his left arm, episodes of tightness in his chest and left sided abdominal pain.   During the last visit it was thought that many of his symptoms were the result of muscle spasms in his left upper back. He was started on a medrol dose pack and asked to take Flexeril QHS.   Today in the office he reports that soon after starting the medrol dose pack and muscle relaxer that his symptoms resolved. He reports " I feel really good, right now".   He does feel as though it is time to see orthopedics to discuss a left hip replacement.   Review of Systems See HPI   Past Medical History:  Diagnosis Date  . Arthritis   . Chicken pox   . Hypertension   . Joint pain   . Obesity   . Osteoarthritis   . Prediabetes   . Swelling    feet and legs    Social History   Socioeconomic History  . Marital status: Married    Spouse name: Not on file  . Number of children: Not on file  . Years of education: Not on file  . Highest education level: Not on file  Social Needs  . Financial resource strain: Not on file  . Food insecurity - worry: Not on file  . Food insecurity - inability: Not on file  . Transportation needs - medical: Not on file  . Transportation needs - non-medical: Not on file  Occupational History  . Occupation: At home caregiver  Tobacco Use  . Smoking status: Former Smoker    Last attempt to quit: 07/16/1992    Years since quitting: 24.9  . Smokeless tobacco: Never Used  Substance and Sexual Activity  . Alcohol use: Yes    Comment: very little per patient   . Drug use: No  . Sexual  activity: Not on file  Other Topics Concern  . 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 cartilage removal    . KNEE SURGERY  2000    Family History  Problem Relation Age of Onset  . Cancer Mother        lung and soft tissue cancer from radiation  . Stroke Mother   . Obesity Mother   . Heart disease Father        CHF  . Diabetes Father   . Hypertension Father   . Obesity Father   . Heart disease Maternal Grandfather   . Diabetes Paternal Grandmother   . Ovarian cancer Sister     Allergies  Allergen Reactions  . Lipitor [Atorvastatin] Other (See Comments)    Lightheaded    Current Outpatient Medications on File  Prior to Visit  Medication Sig Dispense Refill  . Acetaminophen (TYLENOL PO) Take by mouth.    . cyclobenzaprine (FLEXERIL) 10 MG tablet Take 1 tablet (10 mg total) by mouth 3 (three) times daily as needed for muscle spasms. 30 tablet 0  . hydrochlorothiazide (HYDRODIURIL) 25 MG tablet TAKE 1 TABLET BY MOUTH EVERY DAY 90 tablet 1  . ibuprofen (ADVIL,MOTRIN) 200 MG tablet Take 200 mg by mouth every 6 (six) hours as needed.    Marland Kitchen lisinopril (PRINIVIL,ZESTRIL) 5 MG tablet TAKE 1 TABLET (5 MG TOTAL) BY MOUTH DAILY. 90 tablet 1  . metFORMIN (GLUCOPHAGE) 500 MG tablet Take 1 tablet (500 mg total) by mouth daily with breakfast. 30 tablet 0  . methylPREDNISolone (MEDROL DOSEPAK) 4 MG TBPK tablet Take as directed 21 tablet 0  . Multiple Vitamins-Minerals (MULTIVITAMIN WITH MINERALS) tablet Take 1 tablet by mouth daily.    . Vitamin D, Ergocalciferol, (DRISDOL) 50000 units CAPS capsule Take 1 capsule (50,000 Units total) by mouth every 7 (seven) days. (Patient taking differently:  Take 50,000 Units by mouth every 14 (fourteen) days. ) 30 capsule 0   No current facility-administered medications on file prior to visit.     BP 98/62 (BP Location: Right Arm, Patient Position: Sitting, Cuff Size: Large)   Pulse 87   Temp 97.6 F (36.4 C) (Oral)   Ht 5\' 5"  (1.651 m)   Wt 276 lb 12.8 oz (125.6 kg)   SpO2 96%   BMI 46.06 kg/m       Objective:   Physical Exam  Constitutional: He is oriented to person, place, and time. He appears well-developed and well-nourished. No distress.  Cardiovascular: Normal rate, normal heart sounds and intact distal pulses. Exam reveals no friction rub.  No murmur heard. Pulmonary/Chest: Effort normal and breath sounds normal. No respiratory distress. He has no wheezes. He has no rales. He exhibits no tenderness.  Musculoskeletal:  Walks with a rolling walker   Neurological: He is alert and oriented to person, place, and time.  Skin: Skin is warm and dry. No rash noted. He is not diaphoretic. No erythema. No pallor.  Psychiatric: He has a normal mood and affect. His behavior is normal. Judgment and thought content normal.  Nursing note and vitals reviewed.     Assessment & Plan:  His symptoms have resolved. I will refer him to Dr. Maureen Ralphs for evaluation of left hip replacement   Dorothyann Peng, NP

## 2017-06-19 ENCOUNTER — Ambulatory Visit (INDEPENDENT_AMBULATORY_CARE_PROVIDER_SITE_OTHER): Payer: BLUE CROSS/BLUE SHIELD | Admitting: Physician Assistant

## 2017-06-19 ENCOUNTER — Encounter (INDEPENDENT_AMBULATORY_CARE_PROVIDER_SITE_OTHER): Payer: Self-pay | Admitting: Physician Assistant

## 2017-06-19 VITALS — BP 149/82 | HR 87 | Temp 98.4°F | Ht 65.0 in | Wt 274.0 lb

## 2017-06-19 DIAGNOSIS — I10 Essential (primary) hypertension: Secondary | ICD-10-CM | POA: Diagnosis not present

## 2017-06-19 DIAGNOSIS — Z6841 Body Mass Index (BMI) 40.0 and over, adult: Secondary | ICD-10-CM

## 2017-06-19 NOTE — Progress Notes (Signed)
Office: [phone]  /  Fax: [phone]   HPI:   Chief Complaint: OBESITY Jasir is here to discuss his progress with his obesity treatment plan. He is on the Category 3 plan and is following his eating plan approximately 90 % of the time. He states he is exercising 0 minutes 0 times per week. [place]field has had an exacerbation of his hip pain, and states he has deviated with his eating. He is noticing that he has been snacking more, and also states he has not kept up with his protein intake. His weight is 274 lb (124.3 kg) today and has had a weight gain of 3 pounds over a period of 3 weeks since his last visit. He has lost 31 lbs since starting treatment with [name].  Hypertension JJESUS DINGLEY is a 62 y.o. male with hypertension. His blood pressure is elevated today at 149/82  [name] denies chest pain or shortness of breath on exertion. Braylan states his blood pressure at home, and at his other doctor's visit has been low. He is working on weight loss to help control his blood pressure with the goal of decreasing his risk of heart attack and stroke. [name] blood pressure is not currently controlled.  ALLERGIES: Allergies  Allergen Reactions  . Lipitor [Atorvastatin] Other (See Comments)    Lightheaded    MEDICATIONS: Current Outpatient Medications on File Prior to Visit  Medication Sig Dispense Refill  . Acetaminophen (TYLENOL PO) Take by mouth.    . cyclobenzaprine (FLEXERIL) 10 MG tablet Take 1 tablet (10 mg total) by mouth 3 (three) times daily as needed for muscle spasms. 30 tablet 0  . hydrochlorothiazide (HYDRODIURIL) 25 MG tablet TAKE 1 TABLET BY MOUTH EVERY DAY 90 tablet 1  . ibuprofen (ADVIL,MOTRIN) 200 MG tablet Take 200 mg by mouth every 6 (six) hours as needed.    Marland Kitchen lisinopril (PRINIVIL,ZESTRIL) 5 MG tablet TAKE 1 TABLET (5 MG TOTAL) BY MOUTH DAILY. 90 tablet 1  . metFORMIN (GLUCOPHAGE) 500 MG tablet Take 1 tablet (500 mg total) by mouth daily with breakfast. 30  tablet 0  . methylPREDNISolone (MEDROL DOSEPAK) 4 MG TBPK tablet Take as directed 21 tablet 0  . Multiple Vitamins-Minerals (MULTIVITAMIN WITH MINERALS) tablet Take 1 tablet by mouth daily.    . Vitamin D, Ergocalciferol, (DRISDOL) 50000 units CAPS capsule Take 1 capsule (50,000 Units total) by mouth every 7 (seven) days. (Patient taking differently: Take 50,000 Units by mouth every 14 (fourteen) days. ) 30 capsule 0   No current facility-administered medications on file prior to visit.     PAST MEDICAL HISTORY: Past Medical History:  Diagnosis Date  . Arthritis   . Chicken pox   . Hypertension   . Joint pain   . Obesity   . Osteoarthritis   . Prediabetes   . Swelling    feet and legs    PAST SURGICAL HISTORY: Past Surgical History:  Procedure Laterality Date  . cartilage removal    . KNEE SURGERY  2000    SOCIAL HISTORY: Social History   Tobacco Use  . Smoking status: Former Smoker    Last attempt to quit: 07/16/1992    Years since quitting: 24.9  . Smokeless tobacco: Never Used  Substance Use Topics  . Alcohol use: Yes    Comment: very little per patient   . Drug use: No    FAMILY HISTORY: Family History  Problem Relation Age of Onset  . Cancer Mother  lung and soft tissue cancer from radiation  . Stroke Mother   . Obesity Mother   . Heart disease Father        CHF  . Diabetes Father   . Hypertension Father   . Obesity Father   . Heart disease Maternal Grandfather   . Diabetes Paternal Grandmother   . Ovarian cancer Sister     ROS: Review of Systems  Constitutional: Negative for weight loss.  Respiratory: Negative for shortness of breath (on exertion).   Cardiovascular: Negative for chest pain.  Musculoskeletal:       Positive for hip pain    PHYSICAL EXAM: Blood pressure (!) 149/82, pulse 87, temperature 98.4 F (36.9 C), temperature source Oral, height 5\' 5"  (1.651 m), weight 274 lb (124.3 kg), SpO2 97 %. Body mass index is 45.6  kg/m. Physical Exam  Constitutional: He is oriented to person, place, and time. He appears well-developed and well-nourished.  Cardiovascular: Normal rate.  Pulmonary/Chest: Effort normal.  Musculoskeletal: Normal range of motion.  Neurological: He is oriented to person, place, and time.  Skin: Skin is warm and dry.  Psychiatric: He has a normal mood and affect. His behavior is normal.  Vitals reviewed.   RECENT LABS AND TESTS: BMET    Component Value Date/Time   NA 139 05/31/2017 1633   NA 138 04/10/2017 0833   K 4.2 05/31/2017 1633   CL 102 05/31/2017 1633   CO2 26 05/31/2017 1633   GLUCOSE 101 (H) 05/31/2017 1633   BUN 32 (H) 05/31/2017 1633   BUN 23 04/10/2017 0833   CREATININE 0.91 05/31/2017 1633   CALCIUM 9.4 05/31/2017 1633   GFRNONAA 70 04/10/2017 0833   GFRAA 81 04/10/2017 0833   Lab Results  Component Value Date   HGBA1C 5.4 04/10/2017   HGBA1C 5.6 01/01/2017   HGBA1C 5.7 (H) 09/04/2016   HGBA1C 5.9 09/13/2015   HGBA1C 5.8 07/17/2012   Lab Results  Component Value Date   INSULIN 13.4 04/10/2017   INSULIN 25.3 (H) 01/01/2017   INSULIN 17.2 09/04/2016   CBC    Component Value Date/Time   WBC 7.2 05/31/2017 1633   RBC 4.94 05/31/2017 1633   HGB 14.6 05/31/2017 1633   HGB 14.9 09/04/2016 1141   HCT 42.7 05/31/2017 1633   HCT 44.1 09/04/2016 1141   PLT 202 05/31/2017 1633   PLT 192 09/04/2016 1141   MCV 86.4 05/31/2017 1633   MCV 89 09/04/2016 1141   MCH 29.6 05/31/2017 1633   MCHC 34.2 05/31/2017 1633   RDW 12.7 05/31/2017 1633   RDW 14.6 09/04/2016 1141   LYMPHSABS 1,930 05/31/2017 1633   LYMPHSABS 1.6 09/04/2016 1141   MONOABS 0.5 09/13/2015 0910   EOSABS 29 05/31/2017 1633   EOSABS 0.0 09/04/2016 1141   BASOSABS 29 05/31/2017 1633   BASOSABS 0.0 09/04/2016 1141   Iron/TIBC/Ferritin/ %Sat No results found for: IRON, TIBC, FERRITIN, IRONPCTSAT Lipid Panel     Component Value Date/Time   CHOL 174 04/10/2017 0833   TRIG 66 04/10/2017  0833   HDL 52 04/10/2017 0833   CHOLHDL 3 09/13/2015 0910   VLDL 14.2 09/13/2015 0910   LDLCALC 109 (H) 04/10/2017 0833   Hepatic Function Panel     Component Value Date/Time   PROT 7.0 04/10/2017 0833   ALBUMIN 4.5 04/10/2017 0833   AST 19 04/10/2017 0833   ALT 17 04/10/2017 0833   ALKPHOS 56 04/10/2017 0833   BILITOT 0.4 04/10/2017 0833   BILIDIR 0.1 11/14/2006  0928      Component Value Date/Time   TSH 2.410 04/10/2017 0833   TSH 1.830 09/04/2016 1141   TSH 1.42 11/30/2013 1318    ASSESSMENT AND PLAN: Essential hypertension  Class 3 severe obesity with serious comorbidity and body mass index (BMI) of 45.0 to 49.9 in adult, unspecified obesity type ([place])  PLAN:  Hypertension We discussed sodium restriction, working on healthy weight loss, and a regular exercise program as the means to achieve improved blood pressure control. [name] agreed with this plan and agreed to follow up as directed. We will continue to monitor his blood pressure as well as his progress with the above lifestyle modifications. He will continue his medications as prescribed and will watch for signs of hypotension as he continues his lifestyle modifications.  We spent > than 50% of the 15 minute visit on the counseling as documented in the note.  Obesity Taryll is currently in the action stage of change. As such, his goal is to continue with weight loss efforts He has agreed to follow the Category 3 plan [place]heim has been instructed to work up to a goal of 150 minutes of combined cardio and strengthening exercise per week for weight loss and overall health benefits. We discussed the following Behavioral Modification Strategies today: increasing lean protein intake and better snacking choices  Harland has agreed to follow up with our clinic in 2 weeks. He was informed of the importance of frequent follow up visits to maximize his success with intensive lifestyle modifications for his multiple health  conditions.    OBESITY BEHAVIORAL INTERVENTION VISIT  Today's visit was # 18 out of 22.  Starting weight: 305 lbs Starting date: 09/04/16 Today's weight : 274 lbs Today's date: 06/19/2017 Total lbs lost to date: 28 (Patients must lose 7 lbs in the first 6 months to continue with counseling)   ASK: We discussed the diagnosis of obesity with [name] today and [name] agreed to give [name] permission to discuss obesity behavioral modification therapy today.  ASSESS: Jabar has the diagnosis of obesity and his BMI today is 45.6 Joaquin is in the action stage of change   ADVISE: Askia was educated on the multiple health risks of obesity as well as the benefit of weight loss to improve his health. He was advised of the need for long term treatment and the importance of lifestyle modifications.  AGREE: Multiple dietary modification options and treatment options were discussed and  Sal agreed to the above obesity treatment plan.   Corey Skains, am acting as transcriptionist for [employer], PA-C I, Lacy Duverney [facility], have reviewed this note and agree with its content.

## 2017-06-25 ENCOUNTER — Other Ambulatory Visit: Payer: Self-pay | Admitting: Adult Health

## 2017-06-25 NOTE — Telephone Encounter (Signed)
Ok to refill for 30 days  

## 2017-06-25 NOTE — Telephone Encounter (Signed)
Sent to the pharmacy by e-scribe. 

## 2017-07-03 ENCOUNTER — Other Ambulatory Visit (INDEPENDENT_AMBULATORY_CARE_PROVIDER_SITE_OTHER): Payer: Self-pay | Admitting: Family Medicine

## 2017-07-03 ENCOUNTER — Ambulatory Visit (INDEPENDENT_AMBULATORY_CARE_PROVIDER_SITE_OTHER): Payer: BLUE CROSS/BLUE SHIELD | Admitting: Physician Assistant

## 2017-07-03 ENCOUNTER — Encounter (INDEPENDENT_AMBULATORY_CARE_PROVIDER_SITE_OTHER): Payer: Self-pay

## 2017-07-03 DIAGNOSIS — R7303 Prediabetes: Secondary | ICD-10-CM

## 2017-07-08 ENCOUNTER — Encounter (INDEPENDENT_AMBULATORY_CARE_PROVIDER_SITE_OTHER): Payer: Self-pay | Admitting: Physician Assistant

## 2017-07-21 ENCOUNTER — Other Ambulatory Visit: Payer: Self-pay | Admitting: Adult Health

## 2017-07-23 ENCOUNTER — Other Ambulatory Visit: Payer: Self-pay | Admitting: Adult Health

## 2017-07-23 NOTE — Telephone Encounter (Signed)
Ok to refill for 90 days  

## 2017-07-24 MED ORDER — CYCLOBENZAPRINE HCL 10 MG PO TABS: 10.0000 mg | ORAL_TABLET | Freq: Three times a day (TID) | ORAL | 0 refills | Status: DC | PRN

## 2017-07-24 NOTE — Telephone Encounter (Signed)
DUPLICATE REQUEST.  SENT IN TO THE PHARMACY EARLIER TODAY.

## 2017-07-24 NOTE — Telephone Encounter (Signed)
Sent to the pharmacy by e-scribe. 

## 2017-07-27 ENCOUNTER — Other Ambulatory Visit (INDEPENDENT_AMBULATORY_CARE_PROVIDER_SITE_OTHER): Payer: Self-pay | Admitting: Family Medicine

## 2017-07-27 DIAGNOSIS — R7303 Prediabetes: Secondary | ICD-10-CM

## 2017-07-30 ENCOUNTER — Other Ambulatory Visit: Payer: Self-pay | Admitting: Adult Health

## 2017-07-31 NOTE — Telephone Encounter (Signed)
#  90 sent to the pharmacy by e-scribe.  Pt due for annual visit 09/2017.

## 2017-08-19 ENCOUNTER — Other Ambulatory Visit: Payer: Self-pay | Admitting: Adult Health

## 2017-08-20 NOTE — Telephone Encounter (Signed)
Ok to refill 90 +1  

## 2017-08-20 NOTE — Telephone Encounter (Signed)
Sent to the pharmacy by e-scribe as directed. 

## 2017-09-26 ENCOUNTER — Encounter: Payer: Self-pay | Admitting: Family Medicine

## 2017-09-26 ENCOUNTER — Other Ambulatory Visit: Payer: Self-pay | Admitting: Adult Health

## 2017-09-26 NOTE — Telephone Encounter (Signed)
Sent to the pharmacy by e-scribe.  I scheduled pt for annual visit.

## 2017-10-23 ENCOUNTER — Encounter: Payer: Self-pay | Admitting: Adult Health

## 2017-10-23 ENCOUNTER — Ambulatory Visit (INDEPENDENT_AMBULATORY_CARE_PROVIDER_SITE_OTHER): Payer: BLUE CROSS/BLUE SHIELD | Admitting: Adult Health

## 2017-10-23 VITALS — BP 116/80 | Temp 98.6°F | Ht 64.5 in | Wt 264.0 lb

## 2017-10-23 DIAGNOSIS — Z0000 Encounter for general adult medical examination without abnormal findings: Secondary | ICD-10-CM | POA: Diagnosis not present

## 2017-10-23 DIAGNOSIS — E7849 Other hyperlipidemia: Secondary | ICD-10-CM | POA: Diagnosis not present

## 2017-10-23 DIAGNOSIS — E559 Vitamin D deficiency, unspecified: Secondary | ICD-10-CM | POA: Diagnosis not present

## 2017-10-23 DIAGNOSIS — I10 Essential (primary) hypertension: Secondary | ICD-10-CM

## 2017-10-23 DIAGNOSIS — Z125 Encounter for screening for malignant neoplasm of prostate: Secondary | ICD-10-CM

## 2017-10-23 DIAGNOSIS — R7303 Prediabetes: Secondary | ICD-10-CM

## 2017-10-23 LAB — VITAMIN D 25 HYDROXY (VIT D DEFICIENCY, FRACTURES): VITD: 34.36 ng/mL (ref 30.00–100.00)

## 2017-10-23 LAB — LIPID PANEL
Cholesterol: 190 mg/dL (ref 0–200)
HDL: 51.9 mg/dL (ref >39.00)
LDL Cholesterol: 123 mg/dL — ABNORMAL HIGH (ref 0–99)
NonHDL: 138.03
Total CHOL/HDL Ratio: 4
Triglycerides: 76 mg/dL (ref 0.0–149.0)
VLDL: 15.2 mg/dL (ref 0.0–40.0)

## 2017-10-23 LAB — CBC WITH DIFFERENTIAL/PLATELET
Basophils Absolute: 0 10*3/uL (ref 0.0–0.1)
Basophils Relative: 0.4 % (ref 0.0–3.0)
Eosinophils Absolute: 0 10*3/uL (ref 0.0–0.7)
Eosinophils Relative: 0.6 % (ref 0.0–5.0)
HCT: 44.3 % (ref 39.0–52.0)
Hemoglobin: 15.1 g/dL (ref 13.0–17.0)
Lymphocytes Relative: 27.5 % (ref 12.0–46.0)
Lymphs Abs: 1.5 10*3/uL (ref 0.7–4.0)
MCHC: 34.1 g/dL (ref 30.0–36.0)
MCV: 90.1 fl (ref 78.0–100.0)
Monocytes Absolute: 0.4 10*3/uL (ref 0.1–1.0)
Monocytes Relative: 7.3 % (ref 3.0–12.0)
Neutro Abs: 3.4 10*3/uL (ref 1.4–7.7)
Neutrophils Relative %: 64.2 % (ref 43.0–77.0)
Platelets: 185 10*3/uL (ref 150.0–400.0)
RBC: 4.92 Mil/uL (ref 4.22–5.81)
RDW: 13.9 % (ref 11.5–15.5)
WBC: 5.3 10*3/uL (ref 4.0–10.5)

## 2017-10-23 LAB — BASIC METABOLIC PANEL
BUN: 22 mg/dL (ref 6–23)
CO2: 28 mEq/L (ref 19–32)
Calcium: 9.4 mg/dL (ref 8.4–10.5)
Chloride: 102 mEq/L (ref 96–112)
Creatinine, Ser: 0.9 mg/dL (ref 0.40–1.50)
GFR: 90.33 mL/min (ref >60.00)
Glucose, Bld: 109 mg/dL — ABNORMAL HIGH (ref 70–99)
Potassium: 4 mEq/L (ref 3.5–5.1)
Sodium: 139 mEq/L (ref 135–145)

## 2017-10-23 LAB — TSH: TSH: 2.29 u[IU]/mL (ref 0.35–4.50)

## 2017-10-23 LAB — HEPATIC FUNCTION PANEL
ALT: 19 U/L (ref 0–53)
AST: 15 U/L (ref 0–37)
Albumin: 4.3 g/dL (ref 3.5–5.2)
Alkaline Phosphatase: 54 U/L (ref 39–117)
Bilirubin, Direct: 0.1 mg/dL (ref 0.0–0.3)
Total Bilirubin: 0.6 mg/dL (ref 0.2–1.2)
Total Protein: 7 g/dL (ref 6.0–8.3)

## 2017-10-23 LAB — HEMOGLOBIN A1C: Hgb A1c MFr Bld: 5.6 % (ref 4.6–6.5)

## 2017-10-23 LAB — PSA: PSA: 1.39 ng/mL (ref 0.10–4.00)

## 2017-10-23 NOTE — Progress Notes (Signed)
Subjective:    Patient ID: James [name], male    DOB: 1955/08/27, 62 y.o.   MRN: [id number]  HPI  Patient presents for yearly preventative medicine examination. He is a pleasant 62 year old male who  has a past medical history of Arthritis, Chicken pox, Hypertension, Joint pain, Obesity, Osteoarthritis, Prediabetes, and Swelling.  Hypertension - Takes lisinopril 5 mg and HCTZ 25 mg   BP Readings from Last 3 Encounters:  10/23/17 116/80  06/19/17 (!) 149/82  06/06/17 98/62   Pre diabetes - Was placed on Metformin 500 mg by weight loss clinic He stopped taking Metformin 1-2 months ago.  Lab Results  Component Value Date   HGBA1C 5.4 04/10/2017   Vitamin D Deficiency - currently prescribed Vitamin D 50,000 units. He takes this every 14 days   All immunizations and health maintenance protocols were reviewed with the patient and needed orders were placed. He is UTD on vaccinations   Appropriate screening laboratory values were ordered for the patient including screening of hyperlipidemia, renal function and hepatic function. If indicated by BPH, a PSA was ordered.  Medication reconciliation,  past medical history, social history, problem list and allergies were reviewed in detail with the patient  Goals were established with regard to weight loss, exercise, and  diet in compliance with medications. He was seen previously at the weight loss clinic. Stopped going in February. He has been working on it at home. He is eating healthy and using the MyFitness Pal app to track calories  Wt Readings from Last 3 Encounters:  10/23/17 264 lb (119.7 kg)  06/19/17 274 lb (124.3 kg)  06/06/17 276 lb 12.8 oz (125.6 kg)   End of life planning was discussed.  He is UTD on colon cancer screen ( due next year for his next cologuard)    Review of Systems  Constitutional: Negative.   HENT: Negative.   Eyes: Negative.   Respiratory: Negative.   Cardiovascular: Negative.   Gastrointestinal:  Negative.   Endocrine: Negative.   Genitourinary: Negative.   Musculoskeletal: Positive for arthralgias and gait problem.  Skin: Negative.   Allergic/Immunologic: Negative.   Hematological: Negative.   Psychiatric/Behavioral: Negative.   All other systems reviewed and are negative.  Past Medical History:  Diagnosis Date  . Arthritis   . Chicken pox   . Hypertension   . Joint pain   . Obesity   . Osteoarthritis   . Prediabetes   . Swelling    feet and legs    Social History   Socioeconomic History  . Marital status: Married    Spouse name: Not on file  . Number of children: Not on file  . Years of education: Not on file  . Highest education level: Not on file  Occupational History  . Occupation: At home caregiver  Social Needs  . Financial resource strain: Not on file  . Food insecurity:    Worry: Not on file    Inability: Not on file  . Transportation needs:    Medical: Not on file    Non-medical: Not on file  Tobacco Use  . Smoking status: Former Smoker    Last attempt to quit: 07/16/1992    Years since quitting: 25.2  . Smokeless tobacco: Never Used  Substance and Sexual Activity  . Alcohol use: Yes    Comment: very little per patient   . Drug use: No  . Sexual activity: Not on file  Lifestyle  . Physical activity:  Days per week: Not on file    Minutes per session: Not on file  . Stress: Not on file  Relationships  . Social connections:    Talks on phone: Not on file    Gets together: Not on file    Attends religious service: Not on file    Active member of club or organization: Not on file    Attends meetings of clubs or organizations: Not on file    Relationship status: Not on file  . Intimate partner violence:    Fear of current or ex partner: Not on file    Emotionally abused: Not on file    Physically abused: Not on file    Forced sexual activity: Not on file  Other Topics Concern  . 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 cartilage removal    . KNEE SURGERY  2000    Family History  Problem Relation Age of Onset  . Cancer Mother        lung and soft tissue cancer from radiation  . Stroke Mother   . Obesity Mother   . Heart disease Father        CHF  . Diabetes Father   . Hypertension Father   . Obesity Father   . Heart disease Maternal Grandfather   . Diabetes Paternal Grandmother   . Ovarian cancer Sister     Allergies  Allergen Reactions  . Lipitor [Atorvastatin] Other (See Comments)    Lightheaded    Current Outpatient Medications on File Prior to Visit  Medication Sig Dispense Refill  . Acetaminophen (TYLENOL PO) Take by mouth.    . cyclobenzaprine (FLEXERIL) 10 MG tablet TAKE 1 TABLET BY MOUTH THREE TIMES A DAY AS NEEDED FOR MUSCLE SPASMS 90 tablet 1  . hydrochlorothiazide (HYDRODIURIL) 25 MG tablet TAKE 1 TABLET BY MOUTH EVERY DAY 90 tablet 0  . ibuprofen (ADVIL,MOTRIN) 200 MG tablet Take 200 mg by mouth every 6 (six) hours as needed.    Marland Kitchen lisinopril (PRINIVIL,ZESTRIL) 5 MG tablet TAKE 1 TABLET (5 MG TOTAL) BY MOUTH DAILY. 90 tablet 0  . Multiple Vitamins-Minerals (MULTIVITAMIN WITH MINERALS) tablet Take 1 tablet by mouth daily.    . Vitamin D, Ergocalciferol, (DRISDOL) 50000 units CAPS capsule Take 1 capsule (50,000 Units total) by mouth every 7 (seven) days. (Patient taking differently: Take 50,000 Units by mouth every 14 (fourteen) days. ) 30 capsule 0   No current facility-administered medications on file prior to visit.     BP 116/80   Temp 98.6 F (37 C) (Oral)   Ht 5' 4.5" (1.638 m)   Wt 264 lb (119.7 kg)   BMI 44.62 kg/m       Objective:   Physical Exam  Constitutional: He is  oriented to person, place, and time. He appears well-developed and well-nourished. No distress.  Obese   HENT:  Head: Normocephalic and atraumatic.  Right Ear: External ear normal.  Left Ear: External ear normal.  Nose: Nose normal.  Mouth/Throat: Oropharynx is clear and moist. No oropharyngeal exudate.  Eyes: Pupils are equal,  round, and reactive to light. Conjunctivae and EOM are normal. Right eye exhibits no discharge. Left eye exhibits no discharge. No scleral icterus.  Neck: Normal range of motion. Neck supple. No JVD present. No tracheal deviation present. No thyromegaly present.  Cardiovascular: Normal rate, regular rhythm, normal heart sounds and intact distal pulses. Exam reveals no gallop and no friction rub.  No murmur heard. Pulmonary/Chest: Effort normal and breath sounds normal. No stridor. No respiratory distress. He has no wheezes. He has no rales. He exhibits no tenderness.  Abdominal: Soft. Bowel sounds are normal. He exhibits no distension and no mass. There is no tenderness. There is no rebound and no guarding. No hernia.  Genitourinary:  Genitourinary Comments: Will do PSA   Musculoskeletal: Normal range of motion. He exhibits no edema, tenderness or deformity.  Walks with a rolling walker   Lymphadenopathy:    He has no cervical adenopathy.  Neurological: He is alert and oriented to person, place, and time. He displays normal reflexes. No cranial nerve deficit or sensory deficit. He exhibits normal muscle tone. Coordination normal.  Skin: Skin is warm and dry. Capillary refill takes less than 2 seconds. No rash noted. He is not diaphoretic. No erythema. No pallor.  Psychiatric: He has a normal mood and affect. His behavior is normal. Judgment and thought content normal.  Nursing note and vitals reviewed.     Assessment & Plan:  1. Routine general medical examination at a health care facility - Follow up in one year or sooner if needed - Continue to work on weight  loss through diet and exercise - Basic metabolic panel - CBC with Differential/Platelet - Hemoglobin A1c - Hepatic function panel - Lipid panel - TSH  2. Essential hypertension - Well controlled. No change in medications - Basic metabolic panel - CBC with Differential/Platelet - Hemoglobin A1c - Hepatic function panel - Lipid panel - TSH  3. Prediabetes - Consider adding Metformin back  - Basic metabolic panel - CBC with Differential/Platelet - Hemoglobin A1c - Hepatic function panel - Lipid panel - TSH  4. Other hyperlipidemia - Consider increase in stain  - Basic metabolic panel - CBC with Differential/Platelet - Hemoglobin A1c - Hepatic function panel - Lipid panel - TSH  5. Vitamin D deficiency  - Vitamin D, 25-hydroxy  6. Severe obesity (BMI >= 40) (HCC)  - Basic metabolic panel - CBC with Differential/Platelet - Hemoglobin A1c - Hepatic function panel - Lipid panel - TSH - Vitamin D, 25-hydroxy  7. Prostate cancer screening  - PSA  Dorothyann Peng, NP

## 2017-10-30 ENCOUNTER — Other Ambulatory Visit: Payer: Self-pay | Admitting: Adult Health

## 2017-10-30 NOTE — Telephone Encounter (Signed)
Sent to the pharmacy by e-scribe. 

## 2017-12-24 ENCOUNTER — Other Ambulatory Visit: Payer: Self-pay | Admitting: Adult Health

## 2018-03-16 ENCOUNTER — Other Ambulatory Visit: Payer: Self-pay | Admitting: Adult Health

## 2018-03-18 NOTE — Telephone Encounter (Signed)
Sent to the pharmacy by e-scribe. 

## 2018-09-03 ENCOUNTER — Telehealth: Payer: BLUE CROSS/BLUE SHIELD | Admitting: Family

## 2018-09-03 DIAGNOSIS — R6889 Other general symptoms and signs: Principal | ICD-10-CM

## 2018-09-03 DIAGNOSIS — Z20822 Contact with and (suspected) exposure to covid-19: Secondary | ICD-10-CM

## 2018-09-03 MED ORDER — ALBUTEROL SULFATE HFA 108 (90 BASE) MCG/ACT IN AERS: 2.0000 | INHALATION_SPRAY | Freq: Four times a day (QID) | RESPIRATORY_TRACT | 0 refills | Status: DC | PRN

## 2018-09-03 MED ORDER — BENZONATATE 100 MG PO CAPS: 100.0000 mg | ORAL_CAPSULE | Freq: Three times a day (TID) | ORAL | 0 refills | Status: DC | PRN

## 2018-09-03 NOTE — Progress Notes (Signed)
E-Visit for Corona Virus Screening  Based on your current symptoms, you may very well have the virus, however your symptoms are mild. Currently, not all patients are being tested. If the symptoms are mild and there is not a known exposure, performing the test is not indicated.  Approximately 5 minutes was spent documenting and reviewing patient's chart.   Coronavirus disease 2019 (COVID-19) is a respiratory illness that can spread from person to person. The virus that causes COVID-19 is a new virus that was first identified in the country of [place] but is now found in multiple other countries and has spread to the [place].  Symptoms associated with the virus are mild to severe fever, cough, and shortness of breath. There is currently no vaccine to protect against COVID-19, and there is no specific antiviral treatment for the virus.   To be considered HIGH RISK for Coronavirus (COVID-19), you have to meet the following criteria:  . Traveled to [place], [place], [place], [place] or [place]; or in the [place] to [place], [place], [place], or [place]; and have fever, cough, and shortness of breath within the last 2 weeks of travel OR  . Been in close contact with a person diagnosed with COVID-19 within the last 2 weeks and have fever, cough, and shortness of breath  . IF YOU DO NOT MEET THESE CRITERIA, YOU ARE CONSIDERED LOW RISK FOR COVID-19.   It is vitally important that if you feel that you have an infection such as this virus or any other virus that you stay home and away from places where you may spread it to others.  You should self-quarantine for 14 days if you have symptoms that could potentially be coronavirus and avoid contact with people age 28 and older.   You can use medication such as A prescription cough medication called Tessalon Perles 100 mg. You may take 1-2 capsules every 8 hours as needed for cough and A prescription inhaler called Albuterol MDI 90 mcg /actuation 2  puffs every 4 hours as needed for shortness of breath, wheezing, cough.  You may also take acetaminophen (Tylenol) as needed for fever.   Reduce your risk of any infection by using the same precautions used for avoiding the common cold or flu:  Marland Kitchen Wash your hands often with soap and warm water for at least 20 seconds.  If soap and water are not readily available, use an alcohol-based hand sanitizer with at least 60% alcohol.  . If coughing or sneezing, cover your mouth and nose by coughing or sneezing into the elbow areas of your shirt or coat, into a tissue or into your sleeve (not your hands). . Avoid shaking hands with others and consider head nods or verbal greetings only. . Avoid touching your eyes, nose, or mouth with unwashed hands.  . Avoid close contact with people who are sick. . Avoid places or events with large numbers of people in one location, like concerts or sporting events. . Carefully consider travel plans you have or are making. . If you are planning any travel outside or inside the [name], visit the CDC's Travelers' Health webpage for the latest health notices. . If you have some symptoms but not all symptoms, continue to monitor at home and seek medical attention if your symptoms worsen. . If you are having a medical emergency, call 911.  HOME CARE . Only take medications as instructed by your medical team. . Drink plenty of fluids and get plenty  of rest. . A steam or ultrasonic humidifier can help if you have congestion.   GET HELP RIGHT AWAY IF: . You develop worsening fever. . You become short of breath . You cough up blood. . Your symptoms become more severe MAKE SURE YOU   Understand these instructions.  Will watch your condition.  Will get help right away if you are not doing well or get worse.  Your e-visit answers were reviewed by a board certified advanced clinical practitioner to complete your personal care plan.  Depending on the condition, your plan  could have included both over the counter or prescription medications.  If there is a problem please reply once you have received a response from your provider. Your safety is important to [name].  If you have drug allergies check your prescription carefully.    You can use MyChart to ask questions about today's visit, request a non-urgent call back, or ask for a work or school excuse for 24 hours related to this e-Visit. If it has been greater than 24 hours you will need to follow up with your provider, or enter a new e-Visit to address those concerns. You will get an e-mail in the next two days asking about your experience.  I hope that your e-visit has been valuable and will speed your recovery. Thank you for using e-visits.

## 2018-09-28 ENCOUNTER — Other Ambulatory Visit: Payer: Self-pay | Admitting: Adult Health

## 2018-09-30 NOTE — Telephone Encounter (Signed)
SENT TO THE PHARMACY BY E-SCRIBE. 

## 2018-10-16 ENCOUNTER — Other Ambulatory Visit: Payer: Self-pay | Admitting: Adult Health

## 2018-10-17 MED ORDER — CYCLOBENZAPRINE HCL 10 MG PO TABS
ORAL_TABLET | ORAL | 0 refills | Status: DC
Start: 2018-10-17 — End: 2019-02-03

## 2018-10-17 NOTE — Telephone Encounter (Signed)
Sent to the pharmacy by e-scribe. 

## 2018-11-27 ENCOUNTER — Encounter: Payer: Self-pay | Admitting: Adult Health

## 2018-12-10 ENCOUNTER — Encounter: Payer: Self-pay | Admitting: Family Medicine

## 2018-12-11 ENCOUNTER — Encounter: Payer: Self-pay | Admitting: Adult Health

## 2018-12-11 DIAGNOSIS — M1612 Unilateral primary osteoarthritis, left hip: Secondary | ICD-10-CM | POA: Diagnosis not present

## 2019-01-11 ENCOUNTER — Other Ambulatory Visit: Payer: Self-pay | Admitting: Adult Health

## 2019-01-13 ENCOUNTER — Ambulatory Visit (INDEPENDENT_AMBULATORY_CARE_PROVIDER_SITE_OTHER): Payer: PPO | Admitting: Adult Health

## 2019-01-13 ENCOUNTER — Other Ambulatory Visit: Payer: Self-pay

## 2019-01-13 ENCOUNTER — Encounter: Payer: Self-pay | Admitting: Adult Health

## 2019-01-13 VITALS — BP 156/88 | Temp 98.5°F | Wt 310.0 lb

## 2019-01-13 DIAGNOSIS — Z1211 Encounter for screening for malignant neoplasm of colon: Secondary | ICD-10-CM | POA: Diagnosis not present

## 2019-01-13 DIAGNOSIS — I10 Essential (primary) hypertension: Secondary | ICD-10-CM

## 2019-01-13 DIAGNOSIS — R7303 Prediabetes: Secondary | ICD-10-CM | POA: Diagnosis not present

## 2019-01-13 DIAGNOSIS — Z23 Encounter for immunization: Secondary | ICD-10-CM

## 2019-01-13 DIAGNOSIS — Z125 Encounter for screening for malignant neoplasm of prostate: Secondary | ICD-10-CM | POA: Diagnosis not present

## 2019-01-13 DIAGNOSIS — Z0000 Encounter for general adult medical examination without abnormal findings: Secondary | ICD-10-CM | POA: Diagnosis not present

## 2019-01-13 LAB — CBC WITH DIFFERENTIAL/PLATELET
Basophils Absolute: 0 10*3/uL (ref 0.0–0.1)
Basophils Relative: 0.4 % (ref 0.0–3.0)
Eosinophils Absolute: 0 10*3/uL (ref 0.0–0.7)
Eosinophils Relative: 0.5 % (ref 0.0–5.0)
HCT: 43.2 % (ref 39.0–52.0)
Hemoglobin: 14.6 g/dL (ref 13.0–17.0)
Lymphocytes Relative: 26.9 % (ref 12.0–46.0)
Lymphs Abs: 1.4 10*3/uL (ref 0.7–4.0)
MCHC: 33.8 g/dL (ref 30.0–36.0)
MCV: 90.1 fl (ref 78.0–100.0)
Monocytes Absolute: 0.4 10*3/uL (ref 0.1–1.0)
Monocytes Relative: 8.6 % (ref 3.0–12.0)
Neutro Abs: 3.3 10*3/uL (ref 1.4–7.7)
Neutrophils Relative %: 63.6 % (ref 43.0–77.0)
Platelets: 185 10*3/uL (ref 150.0–400.0)
RBC: 4.8 Mil/uL (ref 4.22–5.81)
RDW: 13.9 % (ref 11.5–15.5)
WBC: 5.2 10*3/uL (ref 4.0–10.5)

## 2019-01-13 LAB — COMPREHENSIVE METABOLIC PANEL
ALT: 24 U/L (ref 0–53)
AST: 21 U/L (ref 0–37)
Albumin: 4.2 g/dL (ref 3.5–5.2)
Alkaline Phosphatase: 52 U/L (ref 39–117)
BUN: 20 mg/dL (ref 6–23)
CO2: 28 mEq/L (ref 19–32)
Calcium: 8.9 mg/dL (ref 8.4–10.5)
Chloride: 102 mEq/L (ref 96–112)
Creatinine, Ser: 0.86 mg/dL (ref 0.40–1.50)
GFR: 89.22 mL/min (ref >60.00)
Glucose, Bld: 87 mg/dL (ref 70–99)
Potassium: 4.3 mEq/L (ref 3.5–5.1)
Sodium: 140 mEq/L (ref 135–145)
Total Bilirubin: 0.5 mg/dL (ref 0.2–1.2)
Total Protein: 7.1 g/dL (ref 6.0–8.3)

## 2019-01-13 LAB — LIPID PANEL
Cholesterol: 187 mg/dL (ref 0–200)
HDL: 51.9 mg/dL (ref >39.00)
LDL Cholesterol: 122 mg/dL — ABNORMAL HIGH (ref 0–99)
NonHDL: 135.04
Total CHOL/HDL Ratio: 4
Triglycerides: 64 mg/dL (ref 0.0–149.0)
VLDL: 12.8 mg/dL (ref 0.0–40.0)

## 2019-01-13 LAB — TSH: TSH: 1.92 u[IU]/mL (ref 0.35–4.50)

## 2019-01-13 LAB — HEMOGLOBIN A1C: Hgb A1c MFr Bld: 5.8 % (ref 4.6–6.5)

## 2019-01-13 LAB — PSA: PSA: 1.3 ng/mL (ref 0.10–4.00)

## 2019-01-13 MED ORDER — LISINOPRIL 10 MG PO TABS: 10.0000 mg | ORAL_TABLET | Freq: Every day | ORAL | 3 refills | Status: DC

## 2019-01-13 NOTE — Telephone Encounter (Signed)
Sent to the pharmacy by e-scribe. 

## 2019-01-13 NOTE — Progress Notes (Signed)
Subjective:    Patient ID: James [name], male    DOB: 02/11/1956, 63 y.o.   MRN: [id number]  HPI Patient presents for yearly preventative medicine examination. He is a pleasant 63 year old male who  has a past medical history of Arthritis, Chicken pox, Hypertension, Joint pain, Obesity, Osteoarthritis, Prediabetes, and Swelling.  Essential Hypertension -is taking lisinopril 5 mg and hydrochlorothiazide 25 mg daily.  He denies headaches, blurred vision, dizziness, lightheadedness, chest pain, shortness of breath. He has been monitoring his blood pressure at home and reports readings in the in 150/80's.  BP Readings from Last 3 Encounters:  01/13/19 (!) 156/88  10/23/17 116/80  06/19/17 (!) 149/82   Glucose Intolerance  Lab Results  Component Value Date   HGBA1C 5.6 10/23/2017   All immunizations and health maintenance protocols were reviewed with the patient and needed orders were placed. Due for influenza and Prevnar 13.   Appropriate screening laboratory values were ordered for the patient including screening of hyperlipidemia, renal function and hepatic function. If indicated by BPH, a PSA was ordered.  Medication reconciliation,  past medical history, social history, problem list and allergies were reviewed in detail with the patient  Goals were established with regard to weight loss, exercise, and  diet in compliance with medications. Lives a mostly sedentary lifestyle   Wt Readings from Last 3 Encounters:  01/13/19 (!) 310 lb (140.6 kg)  10/23/17 264 lb (119.7 kg)  06/19/17 274 lb (124.3 kg)   End of life planning was discussed.  Is due this year for cologuard but would like to hold off until after his hi replacement so that he can have   He reports that he will be having a total hip replacement next month. The surgeon is Dr. Maureen Ralphs    Review of Systems  Constitutional: Negative.   HENT: Negative.   Eyes: Negative.   Respiratory: Negative.   Cardiovascular:  Negative.   Gastrointestinal: Negative.   Endocrine: Negative.   Genitourinary: Negative.   Musculoskeletal: Positive for arthralgias and gait problem.  Skin: Negative.   Allergic/Immunologic: Negative.   Hematological: Negative.   Psychiatric/Behavioral: Negative.   All other systems reviewed and are negative.  Past Medical History:  Diagnosis Date  . Arthritis   . Chicken pox   . Hypertension   . Joint pain   . Obesity   . Osteoarthritis   . Prediabetes   . Swelling    feet and legs    Social History   Socioeconomic History  . Marital status: Married    Spouse name: Not on file  . Number of children: Not on file  . Years of education: Not on file  . Highest education level: Not on file  Occupational History  . Occupation: At home caregiver  Social Needs  . Financial resource strain: Not on file  . Food insecurity    Worry: Not on file    Inability: Not on file  . Transportation needs    Medical: Not on file    Non-medical: Not on file  Tobacco Use  . Smoking status: Former Smoker    Quit date: 07/16/1992    Years since quitting: 26.5  . Smokeless tobacco: Never Used  Substance and Sexual Activity  . Alcohol use: Yes    Comment: very little per patient   . Drug use: No  . Sexual activity: Not on file  Lifestyle  . Physical activity    Days per week: Not on file  Minutes per session: Not on file  . Stress: Not on file  Relationships  . Social Herbalist on phone: Not on file    Gets together: Not on file    Attends religious service: Not on file    Active member of club or organization: Not on file    Attends meetings of clubs or organizations: Not on file    Relationship status: Not on file  . Intimate partner violence    Fear of current or ex partner: Not on file    Emotionally abused: Not on file    Physically abused: Not on file    Forced sexual activity: Not on file  Other Topics Concern  . 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 cartilage removal    . KNEE SURGERY  2000    Family History  Problem Relation Age of Onset  . Cancer Mother        lung and soft tissue cancer from radiation  . Stroke Mother   . Obesity Mother   . Heart disease Father        CHF  . Diabetes Father   . Hypertension Father   . Obesity Father   . Heart disease Maternal Grandfather   . Diabetes Paternal Grandmother   . Ovarian cancer Sister     Allergies  Allergen Reactions  . Lipitor [Atorvastatin] Other (See Comments)    Lightheaded    Current Outpatient Medications on File Prior to Visit  Medication Sig Dispense Refill  . Acetaminophen (TYLENOL PO) Take by mouth.    . cyclobenzaprine (FLEXERIL) 10 MG tablet TAKE 1 TABLET BY MOUTH THREE TIMES A DAY AS NEEDED FOR MUSCLE SPASMS 90 tablet 0  . hydrochlorothiazide (HYDRODIURIL) 25 MG tablet TAKE 1 TABLET BY MOUTH EVERY DAY 90 tablet 0  . ibuprofen (ADVIL,MOTRIN) 200 MG tablet Take 200 mg by mouth every 6 (six) hours as needed.    Marland Kitchen lisinopril (ZESTRIL) 5 MG tablet TAKE 1 TABLET (5 MG TOTAL) BY MOUTH DAILY. 90 tablet 0  . Multiple Vitamins-Minerals (MULTIVITAMIN WITH MINERALS) tablet Take 1 tablet by mouth daily.     No current facility-administered medications on file prior to visit.     BP (!) 156/88   Temp 98.5 F (36.9 C) (Oral)   Wt (!) 310 lb (140.6 kg)   BMI 52.39 kg/m       Objective:   Physical Exam Vitals signs and nursing note reviewed.  Constitutional:      Appearance: Normal appearance.  HENT:     Head: Normocephalic and atraumatic.     Right Ear: Tympanic membrane, ear canal and external ear normal. There is no impacted cerumen.     Left Ear: Tympanic  membrane, ear canal and external ear normal. There is no impacted cerumen.     Nose: Nose normal. No congestion or rhinorrhea.     Mouth/Throat:     Mouth: Mucous membranes are moist.     Pharynx: Oropharynx is clear. No oropharyngeal exudate.  Eyes:     Pupils: Pupils are equal,  round, and reactive to light.  Cardiovascular:     Rate and Rhythm: Normal rate and regular rhythm.     Pulses: Normal pulses.     Heart sounds: Normal heart sounds. No murmur. No friction rub. No gallop.   Pulmonary:     Effort: Pulmonary effort is normal. No respiratory distress.     Breath sounds: Normal breath sounds. No stridor. No wheezing, rhonchi or rales.  Chest:     Chest wall: No tenderness.  Abdominal:     General: Abdomen is flat.     Palpations: Abdomen is soft.  Musculoskeletal: Normal range of motion.        General: No swelling, tenderness, deformity or signs of injury.     Right lower leg: No edema.     Left lower leg: No edema.  Skin:    General: Skin is warm and dry.     Capillary Refill: Capillary refill takes less than 2 seconds.     Coloration: Skin is not jaundiced or pale.     Findings: No bruising, erythema, lesion or rash.  Neurological:     General: No focal deficit present.     Mental Status: He is alert and oriented to person, place, and time.     Gait: Gait abnormal.  Psychiatric:        Mood and Affect: Mood normal.        Behavior: Behavior normal.        Thought Content: Thought content normal.        Judgment: Judgment normal.       Assessment & Plan:  1. Routine general medical examination at a health care facility - Follow up in one year for CPE or sooner if needed - CBC with Differential/Platelet - Comprehensive metabolic panel - Hemoglobin A1c - Lipid panel - TSH  2. Essential hypertension - Will increase lisinopril from 5 mg to 10 mg. Elevated BP likely from weight gain  - Advised follow up via mychart in the next week  - CBC with  Differential/Platelet - Comprehensive metabolic panel - Hemoglobin A1c - Lipid panel - TSH  3. Prostate cancer screening  - PSA  4. Prediabetes - Consider metformin  - CBC with Differential/Platelet - Comprehensive metabolic panel - Hemoglobin A1c - Lipid panel - TSH  5. Colon cancer screening - Will wait until after left hip replacement   Dorothyann Peng, NP

## 2019-01-13 NOTE — Patient Instructions (Signed)
It was great seeing you today   I have increased Lisinopril from 5 mg to 10 mg. Please send me a mychart message in the next week to let me know what your blood pressure is  We will follow up with you regarding your lab work   Best of luck in your upcoming surgery

## 2019-01-14 ENCOUNTER — Encounter: Payer: Self-pay | Admitting: Adult Health

## 2019-01-31 ENCOUNTER — Other Ambulatory Visit: Payer: Self-pay | Admitting: Adult Health

## 2019-02-02 ENCOUNTER — Other Ambulatory Visit: Payer: Self-pay | Admitting: Adult Health

## 2019-02-02 ENCOUNTER — Other Ambulatory Visit (HOSPITAL_COMMUNITY): Payer: Self-pay | Admitting: *Deleted

## 2019-02-02 NOTE — Patient Instructions (Addendum)
DUE TO COVID-19 ONLY ONE VISITOR IS ALLOWED TO COME WITH YOU AND STAY IN THE WAITING ROOM ONLY DURING PRE OP AND PROCEDURE DAY OF SURGERY. THE 1 VISITOR MAY VISIT WITH YOU AFTER SURGERY IN YOUR PRIVATE ROOM DURING VISITING HOURS ONLY!  YOU NEED TO HAVE A COVID 19 TEST ON Saturday 02-07-2019 AT 10am. THIS TEST MUST BE DONE BEFORE SURGERY. COME TO [place], [id number].  [facility]). ONCE YOUR COVID TEST IS COMPLETED, PLEASE BEGIN THE QUARANTINE INSTRUCTIONS AS OUTLINED IN YOUR HANDOUT.                James [name]    Your procedure is scheduled on: 02-11-2019   Report to [facility] Main  Entrance   Report to admitting at  920 AM     Call this number if you have problems the morning of surgery [phone]    Remember: [place], NO [place].   NO SOLID FOOD AFTER MIDNIGHT THE NIGHT PRIOR TO SURGERY. NOTHING BY MOUTH EXCEPT CLEAR LIQUIDS UNTIL 850 AM . PLEASE FINISH ENSURE DRINK PER SURGEON ORDER  WHICH NEEDS TO BE COMPLETED AT  850 AM.   CLEAR LIQUID DIET   Foods Allowed                                                                     Foods Excluded  Coffee and tea, regular and decaf                             liquids that you cannot  Plain Jell-O any favor except red or purple                                           see through such as: Fruit ices (not with fruit pulp)                                     milk, soups, orange juice  Iced Popsicles                                    All solid food Carbonated beverages, regular and diet                                    Cranberry, grape and apple juices Sports drinks like Gatorade Lightly seasoned clear broth or consume(fat free) Sugar, honey syrup  Sample Menu Breakfast                                Lunch  Supper Cranberry juice                    Beef broth                             Chicken broth Jell-O                                     Grape juice                           Apple juice Coffee or tea                        Jell-O                                      Popsicle                                                Coffee or tea                        Coffee or tea  _____________________________________________________________________    Take these medicines the morning of surgery with A SIP OF WATER: NONE                                You may not have any metal on your body including hair pins and              piercings  Do not wear jewelry, make-up, lotions, powders or perfumes, deodorant                         Men may shave face and neck.   Do not bring valuables to the hospital. [place].  Contacts, dentures or bridgework may not be worn into surgery.  Leave suitcase in the car. After surgery it may be brought to your room.                  Please read over the following fact sheets you were given: _____________________________________________________________________             [facility] - Preparing for Surgery Before surgery, you can play an important role.  Because skin is not sterile, your skin needs to be as free of germs as possible.  You can reduce the number of germs on your skin by washing with CHG (chlorahexidine gluconate) soap before surgery.  CHG is an antiseptic cleaner which kills germs and bonds with the skin to continue killing germs even after washing. Please DO NOT use if you have an allergy to CHG or antibacterial soaps.  If your skin becomes reddened/irritated stop using the CHG and inform your nurse when you arrive at Short Stay. Do not shave (including legs and underarms) for at least 48 hours prior to the first CHG shower.  You may shave your face/neck. Please follow these instructions carefully:  1.  Shower with CHG Soap the night before surgery and the  morning  of Surgery.  2.  If you choose to wash your hair, wash your hair first as usual with your  normal  shampoo.  3.  After you shampoo, rinse your hair and body thoroughly to remove the  shampoo.                           4.  Use CHG as you would any other liquid soap.  You can apply chg directly  to the skin and wash                       Gently with a scrungie or clean washcloth.  5.  Apply the CHG Soap to your body ONLY FROM THE NECK DOWN.   Do not use on face/ open                           Wound or open sores. Avoid contact with eyes, ears mouth and genitals (private parts).                       Wash face,  Genitals (private parts) with your normal soap.             6.  Wash thoroughly, paying special attention to the area where your surgery  will be performed.  7.  Thoroughly rinse your body with warm water from the neck down.  8.  DO NOT shower/wash with your normal soap after using and rinsing off  the CHG Soap.                9.  Pat yourself dry with a clean towel.            10.  Wear clean pajamas.            11.  Place clean sheets on your bed the night of your first shower and do not  sleep with pets. Day of Surgery : Do not apply any lotions/deodorants the morning of surgery.  Please wear clean clothes to the hospital/surgery center.  FAILURE TO FOLLOW THESE INSTRUCTIONS MAY RESULT IN THE CANCELLATION OF YOUR SURGERY PATIENT SIGNATURE_________________________________  NURSE SIGNATURE__________________________________  ________________________________________________________________________   James [name]  An incentive spirometer is a tool that can help keep your lungs clear and active. This tool measures how well you are filling your lungs with each breath. Taking long deep breaths may help reverse or decrease the chance of developing breathing (pulmonary) problems (especially infection) following:  A long period of time when you are unable to move or be  active. BEFORE THE PROCEDURE   If the spirometer includes an indicator to show your best effort, your nurse or respiratory therapist will set it to a desired goal.  If possible, sit up straight or lean slightly forward. Try not to slouch.  Hold the incentive spirometer in an upright position. INSTRUCTIONS FOR USE  1. Sit on the edge of your bed if possible, or sit up as far as you can in bed or on a chair. 2. Hold the incentive spirometer in an upright position. 3. Breathe out normally. 4. Place the mouthpiece in your mouth and seal your lips tightly around it. 5. Breathe in slowly and as deeply as possible,  raising the piston or the ball toward the top of the column. 6. Hold your breath for 3-5 seconds or for as long as possible. Allow the piston or ball to fall to the bottom of the column. 7. Remove the mouthpiece from your mouth and breathe out normally. 8. Rest for a few seconds and repeat Steps 1 through 7 at least 10 times every 1-2 hours when you are awake. Take your time and take a few normal breaths between deep breaths. 9. The spirometer may include an indicator to show your best effort. Use the indicator as a goal to work toward during each repetition. 10. After each set of 10 deep breaths, practice coughing to be sure your lungs are clear. If you have an incision (the cut made at the time of surgery), support your incision when coughing by placing a pillow or rolled up towels firmly against it. Once you are able to get out of bed, walk around indoors and cough well. You may stop using the incentive spirometer when instructed by your caregiver.  RISKS AND COMPLICATIONS  Take your time so you do not get dizzy or light-headed.  If you are in pain, you may need to take or ask for pain medication before doing incentive spirometry. It is harder to take a deep breath if you are having pain. AFTER USE  Rest and breathe slowly and easily.  It can be helpful to keep track of a log of  your progress. Your caregiver can provide you with a simple table to help with this. If you are using the spirometer at home, follow these instructions: [place] IF:   You are having difficultly using the spirometer.  You have trouble using the spirometer as often as instructed.  Your pain medication is not giving enough relief while using the spirometer.  You develop fever of 100.5 F (38.1 C) or higher. SEEK IMMEDIATE MEDICAL CARE IF:   You cough up bloody sputum that had not been present before.  You develop fever of 102 F (38.9 C) or greater.  You develop worsening pain at or near the incision site. MAKE SURE YOU:   Understand these instructions.  Will watch your condition.  Will get help right away if you are not doing well or get worse. Document Released: 09/03/2006 Document Revised: 07/16/2011 Document Reviewed: 11/04/2006 Exit[employer] Patient Information 2014 [employer], [place].   ________________________________________________________________________  WHAT IS A BLOOD TRANSFUSION? Blood Transfusion Information  A transfusion is the replacement of blood or some of its parts. Blood is made up of multiple cells which provide different functions.  Red blood cells carry oxygen and are used for blood loss replacement.  White blood cells fight against infection.  Platelets control bleeding.  Plasma helps clot blood.  Other blood products are available for specialized needs, such as hemophilia or other clotting disorders. BEFORE THE TRANSFUSION  Who gives blood for transfusions?   Healthy volunteers who are fully evaluated to make sure their blood is safe. This is blood bank blood. Transfusion therapy is the safest it has ever been in the practice of medicine. Before blood is taken from a donor, a complete history is taken to make sure that person has no history of diseases nor engages in risky social behavior (examples are intravenous drug use or sexual activity  with multiple partners). The donor's travel history is screened to minimize risk of transmitting infections, such as malaria. The donated blood is tested for signs of infectious diseases, such as  HIV and hepatitis. The blood is then tested to be sure it is compatible with you in order to minimize the chance of a transfusion reaction. If you or a relative donates blood, this is often done in anticipation of surgery and is not appropriate for emergency situations. It takes many days to process the donated blood. RISKS AND COMPLICATIONS Although transfusion therapy is very safe and saves many lives, the main dangers of transfusion include:   Getting an infectious disease.  Developing a transfusion reaction. This is an allergic reaction to something in the blood you were given. Every precaution is taken to prevent this. The decision to have a blood transfusion has been considered carefully by your caregiver before blood is given. Blood is not given unless the benefits outweigh the risks. AFTER THE TRANSFUSION  Right after receiving a blood transfusion, you will usually feel much better and more energetic. This is especially true if your red blood cells have gotten low (anemic). The transfusion raises the level of the red blood cells which carry oxygen, and this usually causes an energy increase.  The nurse administering the transfusion will monitor you carefully for complications. HOME CARE INSTRUCTIONS  No special instructions are needed after a transfusion. You may find your energy is better. Speak with your caregiver about any limitations on activity for underlying diseases you may have. SEEK MEDICAL CARE IF:   Your condition is not improving after your transfusion.  You develop redness or irritation at the intravenous (IV) site. SEEK IMMEDIATE MEDICAL CARE IF:  Any of the following symptoms occur over the next 12 hours:  Shaking chills.  You have a temperature by mouth above 102 F (38.9  C), not controlled by medicine.  Chest, back, or muscle pain.  People around you feel you are not acting correctly or are confused.  Shortness of breath or difficulty breathing.  Dizziness and fainting.  You get a rash or develop hives.  You have a decrease in urine output.  Your urine turns a dark color or changes to pink, red, or brown. Any of the following symptoms occur over the next 10 days:  You have a temperature by mouth above 102 F (38.9 C), not controlled by medicine.  Shortness of breath.  Weakness after normal activity.  The white part of the eye turns yellow (jaundice).  You have a decrease in the amount of urine or are urinating less often.  Your urine turns a dark color or changes to pink, red, or brown. Document Released: 04/20/2000 Document Revised: 07/16/2011 Document Reviewed: 12/08/2007 [facility] Patient Information 2014 [place], [place].  _______________________________________________________________________

## 2019-02-03 NOTE — Telephone Encounter (Signed)
Sent to the pharmacy by e-scribe. 

## 2019-02-04 ENCOUNTER — Encounter (HOSPITAL_COMMUNITY): Payer: Self-pay

## 2019-02-04 ENCOUNTER — Other Ambulatory Visit: Payer: Self-pay

## 2019-02-04 ENCOUNTER — Encounter (HOSPITAL_COMMUNITY)
Admission: RE | Admit: 2019-02-04 | Discharge: 2019-02-04 | Disposition: A | Discharge status: Home / Self Care | Payer: PPO | Source: Ambulatory Visit | Attending: Orthopedic Surgery | Admitting: Orthopedic Surgery

## 2019-02-04 DIAGNOSIS — M25552 Pain in left hip: Secondary | ICD-10-CM | POA: Diagnosis not present

## 2019-02-04 DIAGNOSIS — Z01818 Encounter for other preprocedural examination: Secondary | ICD-10-CM | POA: Insufficient documentation

## 2019-02-04 DIAGNOSIS — M1612 Unilateral primary osteoarthritis, left hip: Secondary | ICD-10-CM | POA: Diagnosis not present

## 2019-02-04 DIAGNOSIS — R9431 Abnormal electrocardiogram [ECG] [EKG]: Secondary | ICD-10-CM | POA: Insufficient documentation

## 2019-02-04 HISTORY — DX: Other complications of anesthesia, initial encounter: T88.59XA

## 2019-02-04 LAB — CBC
HCT: 44.8 % (ref 39.0–52.0)
Hemoglobin: 14.5 g/dL (ref 13.0–17.0)
MCH: 30.1 pg (ref 26.0–34.0)
MCHC: 32.4 g/dL (ref 30.0–36.0)
MCV: 93.1 fL (ref 80.0–100.0)
Platelets: 193 10*3/uL (ref 150–400)
RBC: 4.81 MIL/uL (ref 4.22–5.81)
RDW: 13.5 % (ref 11.5–15.5)
WBC: 6.8 10*3/uL (ref 4.0–10.5)
nRBC: 0 % (ref 0.0–0.2)

## 2019-02-04 LAB — COMPREHENSIVE METABOLIC PANEL
ALT: 24 U/L (ref 0–44)
AST: 20 U/L (ref 15–41)
Albumin: 4.3 g/dL (ref 3.5–5.0)
Alkaline Phosphatase: 49 U/L (ref 38–126)
Anion gap: 9 (ref 5–15)
BUN: 21 mg/dL (ref 8–23)
CO2: 29 mmol/L (ref 22–32)
Calcium: 9.5 mg/dL (ref 8.9–10.3)
Chloride: 99 mmol/L (ref 98–111)
Creatinine, Ser: 0.89 mg/dL (ref 0.61–1.24)
GFR calc Af Amer: >60 mL/min (ref >60)
GFR calc non Af Amer: >60 mL/min (ref >60)
Glucose, Bld: 117 mg/dL — ABNORMAL HIGH (ref 70–99)
Potassium: 4 mmol/L (ref 3.5–5.1)
Sodium: 137 mmol/L (ref 135–145)
Total Bilirubin: 0.8 mg/dL (ref 0.3–1.2)
Total Protein: 7.6 g/dL (ref 6.5–8.1)

## 2019-02-04 LAB — PROTIME-INR
INR: 1 (ref 0.8–1.2)
Prothrombin Time: 13.4 seconds (ref 11.4–15.2)

## 2019-02-04 LAB — SURGICAL PCR SCREEN
MRSA, PCR: NEGATIVE
Staphylococcus aureus: NEGATIVE

## 2019-02-04 LAB — ABO/RH: ABO/RH(D): A POS

## 2019-02-04 LAB — APTT: aPTT: 28 seconds (ref 24–36)

## 2019-02-04 MED ORDER — CHLORHEXIDINE GLUCONATE 4 % EX LIQD: 60.0000 mL | Freq: Once | CUTANEOUS | Status: DC

## 2019-02-04 NOTE — Progress Notes (Signed)
PCP - Nafziger, NP Cardiologist - n/a  Chest x-ray - n/a EKG - 02/04/19 Stress Test - n/a ECHO - n/a Cardiac Cath - n/a  Sleep Study - n/a, but has major risk factors for sleep apnea CPAP - n/a  Fasting Blood Sugar -  n/a Checks Blood Sugar __0___ times a day  Blood Thinner Instructions: n/a, has stopped ibuprofen and multivitamin Aspirin Instructions: Last Dose:  Anesthesia review:   Patient denies shortness of breath, fever, cough and chest pain at PAT appointment   Patient verbalized understanding of instructions that were given to them at the PAT appointment. Patient was also instructed that they will need to review over the PAT instructions again at home before surgery.

## 2019-02-07 ENCOUNTER — Other Ambulatory Visit (HOSPITAL_COMMUNITY)
Admission: RE | Admit: 2019-02-07 | Discharge: 2019-02-07 | Disposition: A | Discharge status: Home / Self Care | Payer: PPO | Source: Ambulatory Visit | Attending: Orthopedic Surgery | Admitting: Orthopedic Surgery

## 2019-02-07 DIAGNOSIS — Z01812 Encounter for preprocedural laboratory examination: Secondary | ICD-10-CM | POA: Diagnosis not present

## 2019-02-07 DIAGNOSIS — Z20828 Contact with and (suspected) exposure to other viral communicable diseases: Secondary | ICD-10-CM | POA: Diagnosis not present

## 2019-02-09 LAB — NOVEL CORONAVIRUS, NAA (HOSP ORDER, SEND-OUT TO REF LAB; TAT 18-24 HRS): SARS-CoV-2, NAA: NOT DETECTED

## 2019-02-09 NOTE — H&P (Signed)
TOTAL HIP ADMISSION H&P  Patient is admitted for left total hip arthroplasty, posterior approach.  Subjective:  Chief Complaint: left hip pain  HPI: James [name], 63 y.o. male, has a history of pain and functional disability in the left hip(s) due to arthritis and patient has failed non-surgical conservative treatments for greater than 12 weeks to include NSAID's and/or analgesics, corticosteriod injections, flexibility and strengthening excercises, use of assistive devices and activity modification.  Onset of symptoms was gradual starting 5 years ago with gradually worsening course since that time.The patient noted no past surgery on the left hip(s).  Patient currently rates pain in the left hip at 8 out of 10 with activity. Patient has night pain, worsening of pain with activity and weight bearing, pain that interfers with activities of daily living and pain with passive range of motion. Patient has evidence of subchondral cysts, subchondral sclerosis, periarticular osteophytes and joint space narrowing by imaging studies. This condition presents safety issues increasing the risk of falls. There is no current active infection.  Patient Active Problem List   Diagnosis Date Noted  . Other hyperlipidemia 04/10/2017  . Vitamin D deficiency 04/10/2017  . Prediabetes 12/17/2016  . Class 3 obesity with serious comorbidity and body mass index (BMI) of 45.0 to 49.9 in adult 12/17/2016  . At risk for diabetes mellitus 10/02/2016  . Hyperglycemia 09/04/2016  . Severe obesity (BMI >= 40) ([place]) 01/15/2013  . OBESITY, MORBID 01/20/2007  . Essential hypertension 10/31/2006   Past Medical History:  Diagnosis Date  . Arthritis   . Chicken pox   . Complication of anesthesia    difficulty waking up  . Hypertension   . Joint pain   . Obesity   . Osteoarthritis   . Prediabetes   . Swelling    feet and legs    Past Surgical History:  Procedure Laterality Date  . cartilage removal    . KNEE  SURGERY  2000       Current Outpatient Medications  Medication Sig Dispense Refill Last Dose  . acetaminophen (TYLENOL) 500 MG tablet Take 1,000 mg by mouth 2 (two) times daily.     . ASPERCREME LIDOCAINE EX Apply 1 application topically 2 (two) times daily as needed (pain).     Marland Kitchen ibuprofen (ADVIL,MOTRIN) 200 MG tablet Take 400 mg by mouth every 6 (six) hours as needed for moderate pain.      Marland Kitchen lisinopril (ZESTRIL) 10 MG tablet Take 1 tablet (10 mg total) by mouth daily. 90 tablet 3   . Multiple Vitamins-Minerals (MULTIVITAMIN WITH MINERALS) tablet Take 1 tablet by mouth daily.     . cyclobenzaprine (FLEXERIL) 10 MG tablet TAKE 1 TABLET BY MOUTH THREE TIMES A DAY AS NEEDED FOR MUSCLE SPASMS 90 tablet 1   . hydrochlorothiazide (HYDRODIURIL) 25 MG tablet TAKE 1 TABLET BY MOUTH EVERY DAY 90 tablet 3   . lisinopril (ZESTRIL) 5 MG tablet TAKE 1 TABLET (5 MG TOTAL) BY MOUTH DAILY. (Patient not taking: Reported on 02/02/2019) 90 tablet 3 Not Taking at Unknown time   Allergies  Allergen Reactions  . Lipitor [Atorvastatin] Other (See Comments)    Lightheaded    Social History   Tobacco Use  . Smoking status: Former Smoker    Quit date: 07/16/1992    Years since quitting: 26.5  . Smokeless tobacco: Never Used  Substance Use Topics  . Alcohol use: Yes    Comment: very little per patient     Family History  Problem  Relation Age of Onset  . Cancer Mother        lung and soft tissue cancer from radiation  . Stroke Mother   . Obesity Mother   . Heart disease Father        CHF  . Diabetes Father   . Hypertension Father   . Obesity Father   . Heart disease Maternal Grandfather   . Diabetes Paternal Grandmother   . Ovarian cancer Sister      Review of Systems  Constitutional: Negative.   HENT: Negative.   Eyes: Negative.   Respiratory: Positive for shortness of breath. Negative for hemoptysis, sputum production and wheezing.        SOB with exertion  Cardiovascular: Positive for leg  swelling. Negative for chest pain, palpitations, orthopnea, claudication and PND.  Gastrointestinal: Negative.   Genitourinary: Negative.   Musculoskeletal: Positive for joint pain and myalgias. Negative for back pain, falls and neck pain.  Skin: Negative.   Neurological: Negative.   Endo/Heme/Allergies: Negative.   Psychiatric/Behavioral: Negative.     Objective:  Physical Exam  Constitutional: He is oriented to person, place, and time. He appears well-developed. No distress.  HENT:  Head: Normocephalic and atraumatic.  Right Ear: External ear normal.  Left Ear: External ear normal.  Nose: Nose normal.  Mouth/Throat: Oropharynx is clear and moist.  Eyes: Conjunctivae and EOM are normal.  Neck: Normal range of motion. Neck supple.  Cardiovascular: Normal rate, regular rhythm, normal heart sounds and intact distal pulses.  No murmur heard. Respiratory: Effort normal and breath sounds normal. No respiratory distress. He has no wheezes.  GI: Soft. Bowel sounds are normal. He exhibits no distension. There is no abdominal tenderness.  Musculoskeletal:     Comments: Gait is significantly antalgic.  Right Hip Exam: ROM: Normal without discomfort.  There is no tenderness over the greater trochanter.  There is no pain on provocative testing of the hip.  Left Hip Exam: ROM: Flexion to 70 , Rotation 0 and abduction 0 with discomfort.  There is no tenderness over the greater trochanter.  There is no pain on provocative testing of the hip.   Right Knee Exam:  No effusion.  Range of motion is 10-120 degrees.  No crepitus on range of motion of the knee.  No medial or lateral joint line tenderness.  No instability noted  Left Knee Exam:  No effusion.  Range of motion is 0-130 degrees.  No crepitus on range of motion of the knee.  No medial or lateral joint line tenderness.  No instability noted  Neurological: He is alert and oriented to person, place, and time. He has normal  strength. No sensory deficit.  Skin: No rash noted. He is not diaphoretic. No erythema.  Psychiatric: He has a normal mood and affect. His behavior is normal.    Vital Signs BP: 132/84 HR: 80 bpm   Estimated body mass index is 49.87 kg/m as calculated from the following:   Height as of 02/04/19: 5\' 6"  (1.676 m).   Weight as of 02/04/19: 140.2 kg.   Imaging Review Plain radiographs demonstrate severe degenerative joint disease of the left hip(s). The bone quality appears to be fair for age and reported activity level.   Assessment/Plan:  End stage primary osteoarthritis, left hip(s)  The patient history, physical examination, clinical judgement of the provider and imaging studies are consistent with end stage degenerative joint disease of the left hip(s) and total hip arthroplasty is deemed medically necessary. The treatment  options including medical management, injection therapy, arthroscopy and arthroplasty were discussed at length. The risks and benefits of total hip arthroplasty were presented and reviewed. The risks due to aseptic loosening, infection, stiffness, dislocation/subluxation,  thromboembolic complications and other imponderables were discussed.  The patient acknowledged the explanation, agreed to proceed with the plan and consent was signed. Patient is being admitted for inpatient treatment for surgery, pain control, PT, OT, prophylactic antibiotics, VTE prophylaxis, progressive ambulation and ADL's and discharge planning.The patient is planning to be discharged home with home health services vs HEP.   Anticipated LOS equal to or greater than 2 midnights due to - Age 63 and older with one or more of the following:  - Obesity  - Expected need for hospital services (PT, OT, Nursing) required for safe  discharge  - Anticipated need for postoperative skilled nursing care or inpatient rehab  - Active co-morbidities: Diabetes OR   - Unanticipated findings during/Post  Surgery: None  - Patient is a high risk of re-admission due to: None    Risks and benefits of the surgery were discussed with the patient and Dr.Aluisio at their previous office visit, and the patient has elected to move forward with the aforementioned surgery. Post-operative care plans were discussed with the patient today.  Therapy Plans: HHPT vs HEP Disposition: Home with son Planned DVT prophylaxis: aspirin 325mg  BID DME needed: walker PCP: Dr. Dorothyann Peng Other: difficult to wake after surgery per patient  Instructed patient on meds to stop prior to surgery  Ardeen Jourdain, PA-C

## 2019-02-10 MED ORDER — DEXTROSE 5 % IV SOLN
3.0000 g | INTRAVENOUS | Status: AC
  Administered 2019-02-11: 3 g via INTRAVENOUS
  Filled 2019-02-10: qty 3

## 2019-02-11 ENCOUNTER — Other Ambulatory Visit: Payer: Self-pay

## 2019-02-11 ENCOUNTER — Inpatient Hospital Stay (HOSPITAL_COMMUNITY): Payer: PPO

## 2019-02-11 ENCOUNTER — Inpatient Hospital Stay (HOSPITAL_COMMUNITY): Payer: PPO | Admitting: Physician Assistant

## 2019-02-11 ENCOUNTER — Encounter (HOSPITAL_COMMUNITY)
Admission: RE | Disposition: A | Discharge status: Home Health | Payer: Self-pay | Source: Home / Self Care | Attending: Orthopedic Surgery

## 2019-02-11 ENCOUNTER — Inpatient Hospital Stay (HOSPITAL_COMMUNITY)
Admission: RE | Admit: 2019-02-11 | Discharge: 2019-02-13 | DRG: 470 | Disposition: A | Discharge status: Home Health | Payer: PPO | Attending: Orthopedic Surgery | Admitting: Orthopedic Surgery

## 2019-02-11 ENCOUNTER — Inpatient Hospital Stay (HOSPITAL_COMMUNITY): Payer: PPO | Admitting: Anesthesiology

## 2019-02-11 ENCOUNTER — Encounter (HOSPITAL_COMMUNITY): Payer: Self-pay | Admitting: Anesthesiology

## 2019-02-11 DIAGNOSIS — M1612 Unilateral primary osteoarthritis, left hip: Secondary | ICD-10-CM | POA: Diagnosis not present

## 2019-02-11 DIAGNOSIS — Z6841 Body Mass Index (BMI) 40.0 and over, adult: Secondary | ICD-10-CM | POA: Diagnosis not present

## 2019-02-11 DIAGNOSIS — Z8249 Family history of ischemic heart disease and other diseases of the circulatory system: Secondary | ICD-10-CM

## 2019-02-11 DIAGNOSIS — Z96642 Presence of left artificial hip joint: Secondary | ICD-10-CM | POA: Diagnosis not present

## 2019-02-11 DIAGNOSIS — E7849 Other hyperlipidemia: Secondary | ICD-10-CM | POA: Diagnosis not present

## 2019-02-11 DIAGNOSIS — Z8041 Family history of malignant neoplasm of ovary: Secondary | ICD-10-CM

## 2019-02-11 DIAGNOSIS — I10 Essential (primary) hypertension: Secondary | ICD-10-CM | POA: Diagnosis present

## 2019-02-11 DIAGNOSIS — Z791 Long term (current) use of non-steroidal anti-inflammatories (NSAID): Secondary | ICD-10-CM

## 2019-02-11 DIAGNOSIS — Z87891 Personal history of nicotine dependence: Secondary | ICD-10-CM

## 2019-02-11 DIAGNOSIS — Z471 Aftercare following joint replacement surgery: Secondary | ICD-10-CM | POA: Diagnosis not present

## 2019-02-11 DIAGNOSIS — R7303 Prediabetes: Secondary | ICD-10-CM | POA: Diagnosis present

## 2019-02-11 DIAGNOSIS — Z823 Family history of stroke: Secondary | ICD-10-CM | POA: Diagnosis not present

## 2019-02-11 DIAGNOSIS — E559 Vitamin D deficiency, unspecified: Secondary | ICD-10-CM | POA: Diagnosis not present

## 2019-02-11 DIAGNOSIS — Z833 Family history of diabetes mellitus: Secondary | ICD-10-CM

## 2019-02-11 DIAGNOSIS — Z888 Allergy status to other drugs, medicaments and biological substances status: Secondary | ICD-10-CM | POA: Diagnosis not present

## 2019-02-11 DIAGNOSIS — M169 Osteoarthritis of hip, unspecified: Secondary | ICD-10-CM | POA: Diagnosis present

## 2019-02-11 DIAGNOSIS — Z79899 Other long term (current) drug therapy: Secondary | ICD-10-CM | POA: Diagnosis not present

## 2019-02-11 DIAGNOSIS — Z96649 Presence of unspecified artificial hip joint: Secondary | ICD-10-CM

## 2019-02-11 HISTORY — PX: TOTAL HIP ARTHROPLASTY: SHX124

## 2019-02-11 LAB — TYPE AND SCREEN
ABO/RH(D): A POS
Antibody Screen: NEGATIVE

## 2019-02-11 SURGERY — ARTHROPLASTY, HIP, TOTAL,POSTERIOR APPROACH
Anesthesia: General | Site: Hip | Laterality: Left

## 2019-02-11 MED ORDER — DEXAMETHASONE SODIUM PHOSPHATE 10 MG/ML IJ SOLN
10.0000 mg | Freq: Once | INTRAMUSCULAR | Status: AC
  Administered 2019-02-12: 10 mg via INTRAVENOUS
  Filled 2019-02-11: qty 1

## 2019-02-11 MED ORDER — EPHEDRINE SULFATE 50 MG/ML IJ SOLN
INTRAMUSCULAR | Status: DC | PRN
  Administered 2019-02-11 (×3): 10 mg via INTRAVENOUS

## 2019-02-11 MED ORDER — DIPHENHYDRAMINE HCL 12.5 MG/5ML PO ELIX: 12.5000 mg | ORAL_SOLUTION | ORAL | Status: DC | PRN

## 2019-02-11 MED ORDER — STERILE WATER FOR IRRIGATION IR SOLN
Status: DC | PRN
  Administered 2019-02-11: 2000 mL

## 2019-02-11 MED ORDER — MENTHOL 3 MG MT LOZG: 1.0000 | LOZENGE | OROMUCOSAL | Status: DC | PRN

## 2019-02-11 MED ORDER — ACETAMINOPHEN 500 MG PO TABS
500.0000 mg | ORAL_TABLET | Freq: Four times a day (QID) | ORAL | Status: AC
  Administered 2019-02-11 – 2019-02-12 (×3): 500 mg via ORAL
  Filled 2019-02-11 (×3): qty 1

## 2019-02-11 MED ORDER — METOCLOPRAMIDE HCL 5 MG PO TABS: 5.0000 mg | ORAL_TABLET | Freq: Three times a day (TID) | ORAL | Status: DC | PRN

## 2019-02-11 MED ORDER — MORPHINE SULFATE (PF) 2 MG/ML IV SOLN
0.5000 mg | INTRAVENOUS | Status: DC | PRN
  Administered 2019-02-11: 0.5 mg via INTRAVENOUS
  Filled 2019-02-11: qty 1

## 2019-02-11 MED ORDER — LIDOCAINE HCL (CARDIAC) PF 100 MG/5ML IV SOSY
PREFILLED_SYRINGE | INTRAVENOUS | Status: DC | PRN
  Administered 2019-02-11 (×2): 40 mg via INTRAVENOUS

## 2019-02-11 MED ORDER — TRANEXAMIC ACID-NACL 1000-0.7 MG/100ML-% IV SOLN
1000.0000 mg | Freq: Once | INTRAVENOUS | Status: AC
  Administered 2019-02-11: 1000 mg via INTRAVENOUS
  Filled 2019-02-11: qty 100

## 2019-02-11 MED ORDER — PROPOFOL 500 MG/50ML IV EMUL
INTRAVENOUS | Status: DC | PRN
  Administered 2019-02-11: 50 ug/kg/min via INTRAVENOUS

## 2019-02-11 MED ORDER — KETAMINE HCL 10 MG/ML IJ SOLN
INTRAMUSCULAR | Status: AC
  Filled 2019-02-11: qty 1

## 2019-02-11 MED ORDER — BUPIVACAINE HCL (PF) 0.25 % IJ SOLN
INTRAMUSCULAR | Status: AC
  Filled 2019-02-11: qty 30

## 2019-02-11 MED ORDER — FENTANYL CITRATE (PF) 100 MCG/2ML IJ SOLN
INTRAMUSCULAR | Status: DC | PRN
  Administered 2019-02-11 (×2): 50 ug via INTRAVENOUS

## 2019-02-11 MED ORDER — ONDANSETRON HCL 4 MG PO TABS: 4.0000 mg | ORAL_TABLET | Freq: Four times a day (QID) | ORAL | Status: DC | PRN

## 2019-02-11 MED ORDER — FENTANYL CITRATE (PF) 100 MCG/2ML IJ SOLN
INTRAMUSCULAR | Status: AC
  Filled 2019-02-11: qty 2

## 2019-02-11 MED ORDER — PROMETHAZINE HCL 25 MG/ML IJ SOLN: 6.2500 mg | INTRAMUSCULAR | Status: DC | PRN

## 2019-02-11 MED ORDER — FENTANYL CITRATE (PF) 250 MCG/5ML IJ SOLN
INTRAMUSCULAR | Status: AC
  Filled 2019-02-11: qty 5

## 2019-02-11 MED ORDER — POVIDONE-IODINE 10 % EX SWAB
2.0000 "application " | Freq: Once | CUTANEOUS | Status: AC
  Administered 2019-02-11: 2 via TOPICAL

## 2019-02-11 MED ORDER — SODIUM CHLORIDE 0.9 % IV SOLN
INTRAVENOUS | Status: DC | PRN
  Administered 2019-02-11: 20 ug/min via INTRAVENOUS

## 2019-02-11 MED ORDER — PHENYLEPHRINE HCL (PRESSORS) 10 MG/ML IV SOLN
INTRAVENOUS | Status: AC
  Filled 2019-02-11: qty 1

## 2019-02-11 MED ORDER — SODIUM CHLORIDE 0.9 % IV SOLN
INTRAVENOUS | Status: DC
  Administered 2019-02-11: 16:00:00 via INTRAVENOUS

## 2019-02-11 MED ORDER — DEXAMETHASONE SODIUM PHOSPHATE 10 MG/ML IJ SOLN
8.0000 mg | Freq: Once | INTRAMUSCULAR | Status: AC
  Administered 2019-02-11: 8 mg via INTRAVENOUS

## 2019-02-11 MED ORDER — HYDROCHLOROTHIAZIDE 25 MG PO TABS
25.0000 mg | ORAL_TABLET | Freq: Every day | ORAL | Status: DC
  Administered 2019-02-12 – 2019-02-13 (×2): 25 mg via ORAL
  Filled 2019-02-11 (×2): qty 1

## 2019-02-11 MED ORDER — BISACODYL 10 MG RE SUPP: 10.0000 mg | Freq: Every day | RECTAL | Status: DC | PRN

## 2019-02-11 MED ORDER — LACTATED RINGERS IV SOLN
INTRAVENOUS | Status: DC
  Administered 2019-02-11 (×3): via INTRAVENOUS

## 2019-02-11 MED ORDER — MIDAZOLAM HCL 5 MG/5ML IJ SOLN
INTRAMUSCULAR | Status: DC | PRN
  Administered 2019-02-11: 2 mg via INTRAVENOUS

## 2019-02-11 MED ORDER — METHOCARBAMOL 500 MG PO TABS
500.0000 mg | ORAL_TABLET | Freq: Four times a day (QID) | ORAL | Status: DC | PRN
  Administered 2019-02-11 – 2019-02-13 (×4): 500 mg via ORAL
  Filled 2019-02-11 (×4): qty 1

## 2019-02-11 MED ORDER — TRAMADOL HCL 50 MG PO TABS
50.0000 mg | ORAL_TABLET | Freq: Four times a day (QID) | ORAL | Status: DC | PRN
  Administered 2019-02-11: 50 mg via ORAL
  Administered 2019-02-13: 100 mg via ORAL
  Filled 2019-02-11: qty 1
  Filled 2019-02-11: qty 2

## 2019-02-11 MED ORDER — TRANEXAMIC ACID-NACL 1000-0.7 MG/100ML-% IV SOLN
1000.0000 mg | INTRAVENOUS | Status: AC
  Administered 2019-02-11: 1000 mg via INTRAVENOUS
  Filled 2019-02-11: qty 100

## 2019-02-11 MED ORDER — PHENOL 1.4 % MT LIQD: 1.0000 | OROMUCOSAL | Status: DC | PRN

## 2019-02-11 MED ORDER — PROPOFOL 500 MG/50ML IV EMUL
INTRAVENOUS | Status: AC
  Filled 2019-02-11: qty 50

## 2019-02-11 MED ORDER — DEXAMETHASONE SODIUM PHOSPHATE 10 MG/ML IJ SOLN
INTRAMUSCULAR | Status: AC
  Filled 2019-02-11: qty 1

## 2019-02-11 MED ORDER — EPHEDRINE 5 MG/ML INJ
INTRAVENOUS | Status: AC
  Filled 2019-02-11: qty 10

## 2019-02-11 MED ORDER — METOCLOPRAMIDE HCL 5 MG/ML IJ SOLN: 5.0000 mg | Freq: Three times a day (TID) | INTRAMUSCULAR | Status: DC | PRN

## 2019-02-11 MED ORDER — CEFAZOLIN SODIUM-DEXTROSE 2-4 GM/100ML-% IV SOLN
2.0000 g | Freq: Four times a day (QID) | INTRAVENOUS | Status: AC
  Administered 2019-02-11 (×2): 2 g via INTRAVENOUS
  Filled 2019-02-11 (×2): qty 100

## 2019-02-11 MED ORDER — 0.9 % SODIUM CHLORIDE (POUR BTL) OPTIME
TOPICAL | Status: DC | PRN
  Administered 2019-02-11: 1000 mL

## 2019-02-11 MED ORDER — ONDANSETRON HCL 4 MG/2ML IJ SOLN: 4.0000 mg | Freq: Four times a day (QID) | INTRAMUSCULAR | Status: DC | PRN

## 2019-02-11 MED ORDER — POLYETHYLENE GLYCOL 3350 17 G PO PACK: 17.0000 g | PACK | Freq: Every day | ORAL | Status: DC | PRN

## 2019-02-11 MED ORDER — METHOCARBAMOL 500 MG IVPB - SIMPLE MED
500.0000 mg | Freq: Four times a day (QID) | INTRAVENOUS | Status: DC | PRN
  Filled 2019-02-11: qty 50

## 2019-02-11 MED ORDER — ACETAMINOPHEN 10 MG/ML IV SOLN
1000.0000 mg | Freq: Four times a day (QID) | INTRAVENOUS | Status: DC
  Administered 2019-02-11: 1000 mg via INTRAVENOUS
  Filled 2019-02-11: qty 100

## 2019-02-11 MED ORDER — RIVAROXABAN 10 MG PO TABS
10.0000 mg | ORAL_TABLET | Freq: Every day | ORAL | Status: DC
  Administered 2019-02-12 – 2019-02-13 (×2): 10 mg via ORAL
  Filled 2019-02-11 (×2): qty 1

## 2019-02-11 MED ORDER — KETAMINE HCL 10 MG/ML IJ SOLN
INTRAMUSCULAR | Status: DC | PRN
  Administered 2019-02-11: 30 mg via INTRAVENOUS

## 2019-02-11 MED ORDER — MIDAZOLAM HCL 2 MG/2ML IJ SOLN
INTRAMUSCULAR | Status: AC
  Filled 2019-02-11: qty 2

## 2019-02-11 MED ORDER — ONDANSETRON HCL 4 MG/2ML IJ SOLN
INTRAMUSCULAR | Status: AC
  Filled 2019-02-11: qty 2

## 2019-02-11 MED ORDER — HYDROCODONE-ACETAMINOPHEN 5-325 MG PO TABS
1.0000 | ORAL_TABLET | ORAL | Status: DC | PRN
  Administered 2019-02-11 (×2): 1 via ORAL
  Administered 2019-02-12 (×3): 2 via ORAL
  Filled 2019-02-11: qty 2
  Filled 2019-02-11: qty 1
  Filled 2019-02-11 (×2): qty 2
  Filled 2019-02-11: qty 1

## 2019-02-11 MED ORDER — HYDROMORPHONE HCL 1 MG/ML IJ SOLN: 0.2500 mg | INTRAMUSCULAR | Status: DC | PRN

## 2019-02-11 MED ORDER — FLEET ENEMA 7-19 GM/118ML RE ENEM: 1.0000 | ENEMA | Freq: Once | RECTAL | Status: DC | PRN

## 2019-02-11 MED ORDER — DOCUSATE SODIUM 100 MG PO CAPS
100.0000 mg | ORAL_CAPSULE | Freq: Two times a day (BID) | ORAL | Status: DC
  Administered 2019-02-11 – 2019-02-13 (×4): 100 mg via ORAL
  Filled 2019-02-11 (×4): qty 1

## 2019-02-11 SURGICAL SUPPLY — 63 items
BAG DECANTER FOR FLEXI CONT (MISCELLANEOUS) ×2 IMPLANT
BAG ZIPLOCK 12X15 (MISCELLANEOUS) IMPLANT
BIT DRILL 2.8X128 (BIT) ×2 IMPLANT
BLADE EXTENDED COATED 6.5IN (ELECTRODE) ×2 IMPLANT
BLADE SAW SGTL 73X25 THK (BLADE) ×2 IMPLANT
BLADE SURG SZ10 CARB STEEL (BLADE) ×4 IMPLANT
CLSR STERI-STRIP ANTIMIC 1/2X4 (GAUZE/BANDAGES/DRESSINGS) ×2 IMPLANT
COVER SURGICAL LIGHT HANDLE (MISCELLANEOUS) ×2 IMPLANT
COVER WAND RF STERILE (DRAPES) IMPLANT
CUP ACETBLR 52 OD PINNACLE (Hips) ×2 IMPLANT
DECANTER SPIKE VIAL GLASS SM (MISCELLANEOUS) ×2 IMPLANT
DRAPE INCISE IOBAN 66X45 STRL (DRAPES) ×2 IMPLANT
DRAPE ORTHO SPLIT 77X108 STRL (DRAPES) ×2
DRAPE POUCH INSTRU U-SHP 10X18 (DRAPES) ×2 IMPLANT
DRAPE SURG ORHT 6 SPLT 77X108 (DRAPES) ×2 IMPLANT
DRAPE U-SHAPE 47X51 STRL (DRAPES) ×2 IMPLANT
DRSG ADAPTIC 3X8 NADH LF (GAUZE/BANDAGES/DRESSINGS) ×2 IMPLANT
DRSG MEPILEX BORDER 4X4 (GAUZE/BANDAGES/DRESSINGS) ×2 IMPLANT
DRSG MEPILEX BORDER 4X8 (GAUZE/BANDAGES/DRESSINGS) ×2 IMPLANT
DURAPREP 26ML APPLICATOR (WOUND CARE) ×2 IMPLANT
ELECT REM PT RETURN 15FT ADLT (MISCELLANEOUS) ×2 IMPLANT
EVACUATOR 1/8 PVC DRAIN (DRAIN) ×2 IMPLANT
FACESHIELD WRAPAROUND (MASK) ×8 IMPLANT
GAUZE SPONGE 4X4 12PLY STRL (GAUZE/BANDAGES/DRESSINGS) ×2 IMPLANT
GLOVE BIO SURGEON STRL SZ7 (GLOVE) ×2 IMPLANT
GLOVE BIO SURGEON STRL SZ8 (GLOVE) ×2 IMPLANT
GLOVE BIOGEL PI IND STRL 7.0 (GLOVE) ×2 IMPLANT
GLOVE BIOGEL PI IND STRL 8 (GLOVE) ×1 IMPLANT
GLOVE BIOGEL PI INDICATOR 7.0 (GLOVE) ×2
GLOVE BIOGEL PI INDICATOR 8 (GLOVE) ×1
GOWN STRL REUS W/TWL LRG LVL3 (GOWN DISPOSABLE) ×4 IMPLANT
IMMOBILIZER KNEE 20 (SOFTGOODS)
IMMOBILIZER KNEE 20 THIGH 36 (SOFTGOODS) IMPLANT
IMMOBILIZER KNEE 22  40 CIR (ORTHOPEDIC SUPPLIES) ×1
IMMOBILIZER KNEE 22 40 CIR (ORTHOPEDIC SUPPLIES) ×1 IMPLANT
KIT BASIN OR (CUSTOM PROCEDURE TRAY) ×2 IMPLANT
KIT TURNOVER KIT A (KITS) IMPLANT
LINER MARATHON NEUT +4X52X32 (Hips) ×2 IMPLANT
MANIFOLD NEPTUNE II (INSTRUMENTS) ×2 IMPLANT
NDL SAFETY ECLIPSE 18X1.5 (NEEDLE) ×2 IMPLANT
NEEDLE HYPO 18GX1.5 SHARP (NEEDLE) ×2
NS IRRIG 1000ML POUR BTL (IV SOLUTION) ×2 IMPLANT
PACK TOTAL JOINT (CUSTOM PROCEDURE TRAY) ×2 IMPLANT
PADDING CAST COTTON 6X4 STRL (CAST SUPPLIES) ×2 IMPLANT
PASSER SUT SWANSON 36MM LOOP (INSTRUMENTS) ×2 IMPLANT
PROTECTOR NERVE ULNAR (MISCELLANEOUS) ×2 IMPLANT
SLEEVE FEM PROX 18F LRG (Hips) ×2 IMPLANT
STAPLER VISISTAT 35W (STAPLE) ×2 IMPLANT
STEM FEM MOD STD 36 13X18X160 (Hips) ×2 IMPLANT
STRIP CLOSURE SKIN 1/2X4 (GAUZE/BANDAGES/DRESSINGS) ×4 IMPLANT
SUT ETHIBOND NAB CT1 #1 30IN (SUTURE) ×4 IMPLANT
SUT MNCRL AB 4-0 PS2 18 (SUTURE) ×2 IMPLANT
SUT STRATAFIX 0 PDS 27 VIOLET (SUTURE) ×4
SUT VIC AB 2-0 CT1 27 (SUTURE) ×3
SUT VIC AB 2-0 CT1 TAPERPNT 27 (SUTURE) ×3 IMPLANT
SUTURE STRATFX 0 PDS 27 VIOLET (SUTURE) ×2 IMPLANT
SYR 20ML LL LF (SYRINGE) ×2 IMPLANT
SYR 50ML LL SCALE MARK (SYRINGE) IMPLANT
TOWEL OR 17X26 10 PK STRL BLUE (TOWEL DISPOSABLE) ×4 IMPLANT
TOWEL OR NON WOVEN STRL DISP B (DISPOSABLE) ×2 IMPLANT
TRAY FOLEY MTR SLVR 16FR STAT (SET/KITS/TRAYS/PACK) ×2 IMPLANT
WATER STERILE IRR 1000ML POUR (IV SOLUTION) ×2 IMPLANT
YANKAUER SUCT BULB TIP 10FT TU (MISCELLANEOUS) ×2 IMPLANT

## 2019-02-11 NOTE — Anesthesia Postprocedure Evaluation (Signed)
Anesthesia Post Note  Patient: James [name]  Procedure(s) Performed: TOTAL HIP ARTHROPLASTY-POSTERIOR (Left Hip)     Patient location during evaluation: PACU Anesthesia Type: Spinal Level of consciousness: oriented and awake and alert Pain management: pain level controlled Vital Signs Assessment: post-procedure vital signs reviewed and stable Respiratory status: spontaneous breathing, respiratory function stable and patient connected to nasal cannula oxygen Cardiovascular status: blood pressure returned to baseline and stable Postop Assessment: no headache, no backache and no apparent nausea or vomiting Anesthetic complications: no    Last Vitals:  Vitals:   02/11/19 1515 02/11/19 1555  BP: 111/71 113/61  Pulse: 62 (!) 57  Resp: 16 16  Temp: 36.4 C (!) 36.3 C  SpO2: 100% 100%    Last Pain:  Vitals:   02/11/19 1555  TempSrc: Oral  PainSc:                  Maudell Stanbrough S

## 2019-02-11 NOTE — Op Note (Signed)
Pre-operative diagnosis- Osteoarthritis Left hip  Post-operative diagnosis- Osteoarthritis  Left hip  Procedure-  LeftTotal Hip Arthroplasty  Surgeon- Dione Plover. Taelor Moncada, MD  Assistant- Molli Barrows, PA-C   Anesthesia  Spinal  EBL- 550 mL   Drain Hemovac   Complication- None  Condition-PACU - hemodynamically stable.   Brief Clinical Note- James [name] is a 63 y.o. male with end stage arthritis of his left hip with progressively worsening pain and dysfunction. Pain occurs with activity and rest including pain at night. He has tried analgesics, protected weight bearing and rest without benefit. Pain is too severe to attempt physical therapy. Radiographs demonstrate bone on bone arthritis with subchondral cyst formation. He presents now for left THA.  Procedure in detail-   The patient is brought into the operating room and placed on the operating table. After successful administration of Spinal  anesthesia, the patient is placed in the  Right lateral decubitus position with the  Left side up and held in place with the hip positioner. The lower extremity is isolated from the perineum with plastic drapes and time-out is performed by the surgical team. The lower extremity is then prepped and draped in the usual sterile fashion. A short posterolateral incision is made with a ten blade through the subcutaneous tissue to the level of the fascia lata which is incised in line with the skin incision. The sciatic nerve is palpated and protected and the short external rotators and capsule are isolated from the femur. The hip is then dislocated and the center of the femoral head is marked. A trial prosthesis is placed such that the trial head corresponds to the center of the patients' native femoral head. The resection level is marked on the femoral neck and the resection is made with an oscillating saw. The femoral head is removed and femoral retractors placed to gain access to the femoral canal.  The canal finder is passed into the femoral canal and the canal is thoroughly irrigated with sterile saline to remove the fatty contents. Axial reaming is performed to 13.5  mm, proximal reaming to 55F  and the sleeve machined to a large. A 55F large trial sleeve is placed into the proximal femur.      The femur is then retracted anteriorly to gain acetabular exposure. Acetabular retractors are placed and the labrum and osteophytes are removed, Acetabular reaming is performed to 51  mm and a 52  mm Pinnacle acetabular shell is placed in anatomic position with excellent purchase. Additional dome screws were not needed. The permanent 32  mm neutral + 4 Marathon liner is placed into the acetabular shell.      The trial femur is then placed into the femoral canal. The size is 18 x 13  stem with a 36 standard  neck and a 32 + 6 head with the neck version matching  the patients' native anteversion. The hip is reduced with excellent stability with full extension and full external rotation, 70 degrees flexion with 40 degrees adduction and 90 degrees internal rotation and 90 degrees of flexion with 70 degrees of internal rotation. The operative leg is placed on top of the non-operative leg and the leg lengths are found to be equal. The trials are then removed and the permanent implant of the same size is impacted into the femoral canal. The metal  femoral head of the same size as the trial is placed and the hip is reduced with the same stability parameters. The operative leg is  again placed on top of the non-operative leg and the leg lengths are found to be equal.      The wound is then copiously irrigated with saline solution and the capsule and short external rotators are re-attached to the femur through drill holes with Ethibond suture. The fascia lata is closed over a hemovac drain with #) Stratofix suture. The subcutaneous tissues are closed with #1 and2-0 vicryl and the skin closed with staples. The drain is hooked  to suction, incision cleaned and dried, and steri-srips and a bulky sterile dressing applied. The limb is placed into a knee immobilizer and the patient is awakened and transported to recovery in stable condition.      Please note that a surgical assistant was a medical necessity for this procedure in order to perform it in a safe and expeditious manner. The assistant was necessary to provide retraction to the vital neurovascular structures and to retract and position the limb to allow for anatomic placement of the prosthetic components.  Dione Plover James Clendenin, MD    02/11/2019, 1:34 PM

## 2019-02-11 NOTE — Progress Notes (Signed)
Report called and received from PACU, asked to hold pt 30 mins.  Pt arrived to the room via bed at 1530.  Pt A/Ox4 and stable upon assessment.    Order pt a Carb Mod diet, provided instructions on how to order meals.    Pt denies pain at this time, states he can feel me touch his foot and can now feel the cold from the ice.  Will continue to monitor for pain

## 2019-02-11 NOTE — Transfer of Care (Signed)
Immediate Anesthesia Transfer of Care Note  Patient: James [name]  Procedure(s) Performed: TOTAL HIP ARTHROPLASTY-POSTERIOR (Left Hip)  Patient Location: PACU  Anesthesia Type:Spinal  Level of Consciousness: awake, alert , oriented and patient cooperative  Airway & Oxygen Therapy: Patient Spontanous Breathing and Patient connected to face mask oxygen  Post-op Assessment: Report given to RN and Post -op Vital signs reviewed and stable  Post vital signs: Reviewed and stable  Last Vitals:  Vitals Value Taken Time  BP 99/47 02/11/19 1348  Temp    Pulse 61 02/11/19 1350  Resp 10 02/11/19 1350  SpO2 100 % 02/11/19 1350  Vitals shown include unvalidated device data.  Last Pain:  Vitals:   02/11/19 0931  TempSrc: Oral         Complications: No apparent anesthesia complications

## 2019-02-11 NOTE — Plan of Care (Signed)
   Problem: Pain Management: Goal: Pain level will decrease with appropriate interventions Outcome: Progressing    Problem: Skin Integrity: Goal: Risk for impaired skin integrity will decrease Outcome: Progressing   Problem: Safety: Goal: Ability to remain free from injury will improve Outcome: Progressing   Problem: Pain Managment: Goal: General experience of comfort will improve Outcome: Progressing   Problem: Elimination: Goal: Will not experience complications related to urinary retention Outcome: Progressing   Problem: Elimination: Goal: Will not experience complications related to bowel motility Outcome: Progressing   Problem: Coping: Goal: Level of anxiety will decrease Outcome: Progressing   Problem: Nutrition: Goal: Adequate nutrition will be maintained Outcome: Progressing

## 2019-02-11 NOTE — Evaluation (Signed)
Physical Therapy Evaluation Patient Details Name: James [name] MRN: [id number] DOB: [birth year] Today's Date: 02/11/2019   History of Present Illness  Patient is 63 y.o. male s/p Lt THA posterolateral approach with PMH significant for OA, obestity, and HTN.  Clinical Impression  James [name] is a 63 y.o. male POD 0 s/p Lt THA posterolateral approach. Patient reports modified independence with rollator and SPC for mobility at baseline. Patient is now limited by functional impairments (see PT problem list below) and requires min assist for transfers and gait with RW. Patient was able to ambulate ~100 feet with RW and min guard for safety. Patient instructed in exercise to circulation and educated on precautions for posterior hip surgery. Patient will benefit from continued skilled PT interventions to address impairments and progress towards PLOF. Acute PT will follow to progress mobility and stair training in preparation for safe discharge home.     Follow Up Recommendations Follow surgeon's recommendation for DC plan and follow-up therapies;Home health PT    Equipment Recommendations  None recommended by PT    Recommendations for Other Services       Precautions / Restrictions Precautions Precautions: Fall;Posterior Hip Precaution Booklet Issued: Yes (comment) Precaution Comments: immobilizer for safety Required Braces or Orthoses: Knee Immobilizer - Left Restrictions Weight Bearing Restrictions: No      Mobility  Bed Mobility Overal bed mobility: Needs Assistance Bed Mobility: Supine to Sit     Supine to sit: Min assist;HOB elevated     General bed mobility comments: assist to manage bil LE's and to raise trunk, cues to use bed rails and assist to steady upon sitting EOB  Transfers Overall transfer level: Needs assistance Equipment used: Rolling walker (2 wheeled) Transfers: Sit to/from Stand Sit to Stand: Min assist         General transfer comment: cues for  safe hand placement and technique with RW, assist to steady with rise and to complete power up  Ambulation/Gait Ambulation/Gait assistance: Min guard Gait Distance (Feet): 100 Feet Assistive device: Rolling walker (2 wheeled) Gait Pattern/deviations: Step-through pattern;Decreased step length - left;Decreased stride length Gait velocity: decreased   General Gait Details: pt with good step through sequencing and maintained safe hand placement throughout, no overt LOB noted, pt limited with Lt foot clearance secondary to immoblizer  Stairs            Wheelchair Mobility    Modified Rankin (Stroke Patients Only)       Balance Overall balance assessment: Needs assistance Sitting-balance support: No upper extremity supported;Feet supported;Bilateral upper extremity supported Sitting balance-Leahy Scale: Good     Standing balance support: During functional activity;Bilateral upper extremity supported Standing balance-Leahy Scale: Fair Standing balance comment: requries support for standing and gait                Pertinent Vitals/Pain Pain Assessment: No/denies pain    Home Living Family/patient expects to be discharged to:: Private residence Living Arrangements: Spouse/significant other;Other (Comment)(Spouse WC dependent) Available Help at Discharge: Family Type of Home: House Home Access: Other (comment)(platform lift from driveway up to the door)     Home Layout: Two level;Able to live on main level with bedroom/bathroom Home Equipment: Kasandra Knudsen - single point;Walker - 2 wheels;Walker - 4 wheels;Grab bars - tub/shower;Grab bars - toilet(pt has bariatric RW) Additional Comments: pt is the caregiver 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 She has a personal aid on M-F from 10am-1pm. They has also  hired an aid through another agency that will be there from 4pm-9pm, 7 days/week for at least 1 month while he recovers from surgery.     Prior Function Level of Independence: Independent with assistive device(s)         Comments: pt was using rollator and occasionally uses a SPC     Hand Dominance   Dominant Hand: Right    Extremity/Trunk Assessment   Upper Extremity Assessment Upper Extremity Assessment: Overall WFL for tasks assessed    Lower Extremity Assessment Lower Extremity Assessment: LLE deficits/detail LLE Deficits / Details: sensation appears intact to light touch and pt able to perform SLR with contralateral LE LLE: Unable to fully assess due to immobilization    Cervical / Trunk Assessment Cervical / Trunk Assessment: Normal  Communication   Communication: No difficulties  Cognition Arousal/Alertness: Awake/alert Behavior During Therapy: WFL for tasks assessed/performed Overall Cognitive Status: Within Functional Limits for tasks assessed             General Comments      Exercises Total Joint Exercises Ankle Circles/Pumps: AROM;10 reps;Seated;Both   Assessment/Plan    PT Assessment Patient needs continued PT services  PT Problem List Decreased strength;Decreased balance;Decreased range of motion;Decreased mobility;Decreased activity tolerance;Decreased knowledge of use of DME       PT Treatment Interventions DME instruction;Functional mobility training;Balance training;Patient/family education;Modalities;Therapeutic activities;Gait training;Stair training;Therapeutic exercise    PT Goals (Current goals can be found in the Care Plan section)  Acute Rehab PT Goals Patient Stated Goal: to get home and back to ambulating with [facility] PT Goal Formulation: With patient Time For Goal Achievement: 02/11/19 Potential to Achieve Goals: Good    Frequency 7X/week    AM-PAC PT "6 Clicks" Mobility  Outcome Measure Help needed turning from your back to your side while in a flat bed without using bedrails?: A Little Help needed moving from lying on your back to sitting on the side of a  flat bed without using bedrails?: A Little Help needed moving to and from a bed to a chair (including a wheelchair)?: A Little Help needed standing up from a chair using your arms (e.g., wheelchair or bedside chair)?: A Little Help needed to walk in hospital room?: A Little Help needed climbing 3-5 steps with a railing? : A Lot 6 Click Score: 17    End of Session Equipment Utilized During Treatment: Gait belt Activity Tolerance: Patient tolerated treatment well Patient left: in chair;with call bell/phone within reach;with chair alarm set Nurse Communication: Mobility status PT Visit Diagnosis: Muscle weakness (generalized) (M62.81);Difficulty in walking, not elsewhere classified (R26.2)    Time: [id number] PT Time Calculation (min) (ACUTE ONLY): 36 min   Charges:   PT Evaluation $PT Eval Low Complexity: 1 Low PT Treatments $Gait Training: 8-22 mins       Kipp Brood, PT, DPT, [facility] Physical Therapist with [facility]  02/11/2019 6:46 PM

## 2019-02-11 NOTE — Interval H&P Note (Signed)
History and Physical Interval Note:  02/11/2019 10:05 AM  James [name]  has presented today for surgery, with the diagnosis of Left hip osteoarthritis.  The various methods of treatment have been discussed with the patient and family. After consideration of risks, benefits and other options for treatment, the patient has consented to  Procedure(s) with comments: TOTAL HIP ARTHROPLASTY-POSTERIOR (Left) - 11min as a surgical intervention.  The patient's history has been reviewed, patient examined, no change in status, stable for surgery.  I have reviewed the patient's chart and labs.  Questions were answered to the patient's satisfaction.     Pilar Plate [name]

## 2019-02-11 NOTE — Plan of Care (Signed)
Initiated care plan   Problem: Education: Goal: Knowledge of the prescribed therapeutic regimen will improve Outcome: Progressing Goal: Understanding of discharge needs will improve Outcome: Progressing Goal: Individualized Educational Video(s) Outcome: Progressing   Problem: Activity: Goal: Ability to avoid complications of mobility impairment will improve Outcome: Progressing Goal: Ability to tolerate increased activity will improve Outcome: Progressing   Problem: Clinical Measurements: Goal: Postoperative complications will be avoided or minimized Outcome: Progressing   Problem: Pain Management: Goal: Pain level will decrease with appropriate interventions Outcome: Progressing   Problem: Skin Integrity: Goal: Will show signs of wound healing Outcome: Progressing   Problem: Education: Goal: Knowledge of General Education information will improve Description: Including pain rating scale, medication(s)/side effects and non-pharmacologic comfort measures Outcome: Progressing   Problem: Health Behavior/Discharge Planning: Goal: Ability to manage health-related needs will improve Outcome: Progressing   Problem: Clinical Measurements: Goal: Ability to maintain clinical measurements within normal limits will improve Outcome: Progressing Goal: Will remain free from infection Outcome: Progressing Goal: Diagnostic test results will improve Outcome: Progressing Goal: Respiratory complications will improve Outcome: Progressing Goal: Cardiovascular complication will be avoided Outcome: Progressing   Problem: Activity: Goal: Risk for activity intolerance will decrease Outcome: Progressing   Problem: Nutrition: Goal: Adequate nutrition will be maintained Outcome: Progressing   Problem: Coping: Goal: Level of anxiety will decrease Outcome: Progressing   Problem: Elimination: Goal: Will not experience complications related to bowel motility Outcome: Progressing Goal:  Will not experience complications related to urinary retention Outcome: Progressing   Problem: Pain Managment: Goal: General experience of comfort will improve Outcome: Progressing   Problem: Safety: Goal: Ability to remain free from injury will improve Outcome: Progressing   Problem: Skin Integrity: Goal: Risk for impaired skin integrity will decrease Outcome: Progressing

## 2019-02-11 NOTE — Discharge Instructions (Addendum)
Dr. Gaynelle Arabian Total Joint Specialist [facility] [address], [place], [place] [address] [phone]   POSTERIOR TOTAL HIP REPLACEMENT POSTOPERATIVE DIRECTIONS  Hip Rehabilitation, Guidelines Following Surgery  The results of a hip operation are greatly improved after range of motion and muscle strengthening exercises. Follow all safety measures which are given to protect your hip. If any of these exercises cause increased pain or swelling in your joint, decrease the amount until you are comfortable again. Then slowly increase the exercises. Call your caregiver if you have problems or questions.   HOME CARE INSTRUCTIONS  Remove items at home which could result in a fall. This includes throw rugs or furniture in walking pathways.   ICE to the affected hip every three hours for 30 minutes at a time and then as needed for pain and swelling.  Continue to use ice on the hip for pain and swelling from surgery. You may notice swelling that will progress down to the foot and ankle.  This is normal after surgery.  Elevate the leg when you are not up walking on it.    Continue to use the breathing machine which will help keep your temperature down.  It is common for your temperature to cycle up and down following surgery, especially at night when you are not up moving around and exerting yourself.  The breathing machine keeps your lungs expanded and your temperature down.  DIET You may resume your previous home diet once your are discharged from the hospital.  DRESSING / WOUND CARE / SHOWERING You may shower 3 days after surgery, but keep the wounds dry during showering.  You may use an occlusive plastic wrap (Press'n Seal for example), NO SOAKING/SUBMERGING IN THE BATHTUB.  If the bandage gets wet, change with a clean dry gauze.  If the incision gets wet, pat the wound dry with a clean towel. You may start showering once you are discharged home but do not submerge the incision  under water. Just pat the incision dry and apply a dry gauze dressing on daily. Change the surgical dressing daily and reapply a dry dressing each time.    ACTIVITY Walk with your walker as instructed. Use walker as long as suggested by your caregivers. Avoid periods of inactivity such as sitting longer than an hour when not asleep. This helps prevent blood clots.  You may resume a sexual relationship in one month or when given the OK by your doctor.  You may return to work once you are cleared by your doctor.  Do not drive a car for 6 weeks or until released by you surgeon.  Do not drive while taking narcotics.  WEIGHT BEARING Weight bearing as tolerated with assist device (walker, cane, etc) as directed, use it as long as suggested by your surgeon or therapist, typically at least 4-6 weeks.  POSTOPERATIVE CONSTIPATION PROTOCOL Constipation - defined medically as fewer than three stools per week and severe constipation as less than one stool per week.  One of the most common issues patients have following surgery is constipation.  Even if you have a regular bowel pattern at home, your normal regimen is likely to be disrupted due to multiple reasons following surgery.  Combination of anesthesia, postoperative narcotics, change in appetite and fluid intake all can affect your bowels.  In order to avoid complications following surgery, here are some recommendations in order to help you during your recovery period.  Colace (docusate) - Pick up an over-the-counter  form of Colace or another stool softener and take twice a day as long as you are requiring postoperative pain medications.  Take with a full glass of water daily.  If you experience loose stools or diarrhea, hold the colace until you stool forms back up.  If your symptoms do not get better within 1 week or if they get worse, check with your doctor.  Dulcolax (bisacodyl) - Pick up over-the-counter and take as directed by the product  packaging as needed to assist with the movement of your bowels.  Take with a full glass of water.  Use this product as needed if not relieved by Colace only.   MiraLax (polyethylene glycol) - Pick up over-the-counter to have on hand.  MiraLax is a solution that will increase the amount of water in your bowels to assist with bowel movements.  Take as directed and can mix with a glass of water, juice, soda, coffee, or tea.  Take if you go more than two days without a movement. Do not use MiraLax more than once per day. Call your doctor if you are still constipated or irregular after using this medication for 7 days in a row.  If you continue to have problems with postoperative constipation, please contact the office for further assistance and recommendations.  If you experience "the worst abdominal pain ever" or develop nausea or vomiting, please contact the office immediatly for further recommendations for treatment.  ITCHING  If you experience itching with your medications, try taking only a single pain pill, or even half a pain pill at a time.  You can also use Benadryl over the counter for itching or also to help with sleep.   TED HOSE STOCKINGS Wear the elastic stockings on both legs for three weeks following surgery during the day but you may remove then at night for sleeping.  MEDICATIONS See your medication summary on the After Visit Summary that the nursing staff will review with you prior to discharge.  You may have some home medications which will be placed on hold until you complete the course of blood thinner medication.  It is important for you to complete the blood thinner medication as prescribed by your surgeon.  Continue your approved medications as instructed at time of discharge.  PRECAUTIONS If you experience chest pain or shortness of breath - call 911 immediately for transfer to the hospital emergency department.  If you develop a fever greater that 101 F, purulent drainage  from wound, increased redness or drainage from wound, foul odor from the wound/dressing, or calf pain - CONTACT YOUR SURGEON.                                                   FOLLOW-UP APPOINTMENTS Make sure you keep all of your appointments after your operation with your surgeon and caregivers. You should call the office at the above phone number and make an appointment for approximately two weeks after the date of your surgery or on the date instructed by your surgeon outlined in the "After Visit Summary".  RANGE OF MOTION AND STRENGTHENING EXERCISES  These exercises are designed to help you keep full movement of your hip joint. Follow your caregiver's or physical therapist's instructions. Perform all exercises about fifteen times, three times per day or as directed. Exercise both hips, even if you  have had only one joint replacement. These exercises can be done on a training (exercise) mat, on the floor, on a table or on a bed. Use whatever works the best and is most comfortable for you. Use music or television while you are exercising so that the exercises are a pleasant break in your day. This will make your life better with the exercises acting as a break in routine you can look forward to.  Lying on your back, slowly slide your foot toward your buttocks, raising your knee up off the floor. Then slowly slide your foot back down until your leg is straight again.  Lying on your back spread your legs as far apart as you can without causing discomfort.  Lying on your side, raise your upper leg and foot straight up from the floor as far as is comfortable. Slowly lower the leg and repeat.  Lying on your back, tighten up the muscle in the front of your thigh (quadriceps muscles). You can do this by keeping your leg straight and trying to raise your heel off the floor. This helps strengthen the largest muscle supporting your knee.  Lying on your back, tighten up the muscles of your buttocks both with the  legs straight and with the knee bent at a comfortable angle while keeping your heel on the floor.      IF YOU ARE TRANSFERRED TO A SKILLED REHAB FACILITY If the patient is transferred to a skilled rehab facility following release from the hospital, a list of the current medications will be sent to the facility for the patient to continue.  When discharged from the skilled rehab facility, please have the facility set up the patient's [place] prior to being released. Also, the skilled facility will be responsible for providing the patient with their medications at time of release from the facility to include their pain medication, the muscle relaxants, and their blood thinner medication. If the patient is still at the rehab facility at time of the two week follow up appointment, the skilled rehab facility will also need to assist the patient in arranging follow up appointment in our office and any transportation needs.  MAKE SURE YOU:  Understand these instructions.  Get help right away if you are not doing well or get worse.    Pick up stool softner and laxative for home use following surgery while on pain medications. Do not submerge incision under water. Please use good hand washing techniques while changing dressing each day. May shower starting three days after surgery. Please use a clean towel to pat the incision dry following showers. Continue to use ice for pain and swelling after surgery. Do not use any lotions or creams on the incision until instructed by your surgeon.  ----------------------------------------------------------------------------------------------------------------------------------------- Information on my medicine - XARELTO (Rivaroxaban)  This medication education was reviewed with me or my healthcare representative as part of my discharge preparation.  Why was Xarelto prescribed for you? Xarelto was prescribed for you to reduce the risk of  blood clots forming after orthopedic surgery. The medical term for these abnormal blood clots is venous thromboembolism (VTE).  What do you need to know about xarelto ? Take your Xarelto ONCE DAILY at the same time every day. You may take it either with or without food.  If you have difficulty swallowing the tablet whole, you may crush it and mix in applesauce just prior to taking your dose.  Take Xarelto exactly as prescribed by your  doctor and DO NOT stop taking Xarelto without talking to the doctor who prescribed the medication.  Stopping without other VTE prevention medication to take the place of Xarelto may increase your risk of developing a clot.  After discharge, you should have regular check-up appointments with your healthcare provider that is prescribing your Xarelto.    What do you do if you miss a dose? If you miss a dose, take it as soon as you remember on the same day then continue your regularly scheduled once daily regimen the next day. Do not take two doses of Xarelto on the same day.   Important Safety Information A possible side effect of Xarelto is bleeding. You should call your healthcare provider right away if you experience any of the following: ? Bleeding from an injury or your nose that does not stop. ? Unusual colored urine (red or dark brown) or unusual colored stools (red or black). ? Unusual bruising for unknown reasons. ? A serious fall or if you hit your head (even if there is no bleeding).  Some medicines may interact with Xarelto and might increase your risk of bleeding while on Xarelto. To help avoid this, consult your healthcare provider or pharmacist prior to using any new prescription or non-prescription medications, including herbals, vitamins, non-steroidal anti-inflammatory drugs (NSAIDs) and supplements.  This website has more information on Xarelto: [email or web address]

## 2019-02-11 NOTE — Anesthesia Preprocedure Evaluation (Addendum)
Anesthesia Evaluation  Patient identified by MRN, date of birth, ID band Patient awake    Reviewed: Allergy & Precautions, NPO status , Patient's Chart, lab work & pertinent test results  Airway Mallampati: II  TM Distance: >3 FB Neck ROM: Full    Dental no notable dental hx.    Pulmonary neg pulmonary ROS, former smoker,    Pulmonary exam normal breath sounds clear to auscultation       Cardiovascular hypertension, Normal cardiovascular exam Rhythm:Regular Rate:Normal     Neuro/Psych negative neurological ROS  negative psych ROS   GI/Hepatic negative GI ROS, Neg liver ROS,   Endo/Other  Morbid obesity  Renal/GU negative Renal ROS  negative genitourinary   Musculoskeletal negative musculoskeletal ROS (+)   Abdominal   Peds negative pediatric ROS (+)  Hematology negative hematology ROS (+)   Anesthesia Other Findings   Reproductive/Obstetrics negative OB ROS                             Anesthesia Physical Anesthesia Plan  ASA: III  Anesthesia Plan: Spinal   Post-op Pain Management:    Induction: Intravenous  PONV Risk Score and Plan: 2 and Ondansetron, Dexamethasone and Treatment may vary due to age or medical condition  Airway Management Planned: Simple Face Mask  Additional Equipment:   Intra-op Plan:   Post-operative Plan:   Informed Consent: I have reviewed the patients History and Physical, chart, labs and discussed the procedure including the risks, benefits and alternatives for the proposed anesthesia with the patient or authorized representative who has indicated his/her understanding and acceptance.     Dental advisory given  Plan Discussed with: CRNA and Surgeon  Anesthesia Plan Comments:        Anesthesia Quick Evaluation

## 2019-02-12 LAB — CBC
HCT: 37.7 % — ABNORMAL LOW (ref 39.0–52.0)
Hemoglobin: 12.3 g/dL — ABNORMAL LOW (ref 13.0–17.0)
MCH: 30.1 pg (ref 26.0–34.0)
MCHC: 32.6 g/dL (ref 30.0–36.0)
MCV: 92.2 fL (ref 80.0–100.0)
Platelets: 149 10*3/uL — ABNORMAL LOW (ref 150–400)
RBC: 4.09 MIL/uL — ABNORMAL LOW (ref 4.22–5.81)
RDW: 13 % (ref 11.5–15.5)
WBC: 7.9 10*3/uL (ref 4.0–10.5)
nRBC: 0 % (ref 0.0–0.2)

## 2019-02-12 LAB — BASIC METABOLIC PANEL
Anion gap: 12 (ref 5–15)
BUN: 13 mg/dL (ref 8–23)
CO2: 24 mmol/L (ref 22–32)
Calcium: 8.1 mg/dL — ABNORMAL LOW (ref 8.9–10.3)
Chloride: 99 mmol/L (ref 98–111)
Creatinine, Ser: 0.7 mg/dL (ref 0.61–1.24)
GFR calc Af Amer: >60 mL/min (ref >60)
GFR calc non Af Amer: >60 mL/min (ref >60)
Glucose, Bld: 155 mg/dL — ABNORMAL HIGH (ref 70–99)
Potassium: 3.8 mmol/L (ref 3.5–5.1)
Sodium: 135 mmol/L (ref 135–145)

## 2019-02-12 NOTE — Progress Notes (Signed)
Physical Therapy Treatment Patient Details Name: James [name] MRN: [id number] DOB: 1955-07-18 Today's Date: 02/12/2019    History of Present Illness Patient is 63 y.o. male s/p Lt THA posterolateral approach with PMH significant for OA, obesity, and HTN.    PT Comments    Pt ambulated 130' with RW and performed THA HEP with min assistance. Reviewed posterior precautions in detail, pt verbalizes understanding. He is progressing well with mobility.    Follow Up Recommendations  Follow surgeon's recommendation for DC plan and follow-up therapies;Home health PT     Equipment Recommendations  None recommended by PT    Recommendations for Other Services       Precautions / Restrictions Precautions Precautions: Fall;Posterior Hip Precaution Booklet Issued: Yes (comment) Precaution Comments: reviewed posterior precautions in detail, issued handout Restrictions Weight Bearing Restrictions: No Other Position/Activity Restrictions: WBAT    Mobility  Bed Mobility Overal bed mobility: Needs Assistance Bed Mobility: Supine to Sit     Supine to sit: Min assist;HOB elevated     General bed mobility comments: assist to manage bil LE's and to raise trunk, cues to use bed rails and assist to steady upon sitting EOB  Transfers Overall transfer level: Needs assistance Equipment used: Rolling walker (2 wheeled) Transfers: Sit to/from Stand Sit to Stand: Min guard         General transfer comment: cues for safe hand placement and technique with RW  Ambulation/Gait Ambulation/Gait assistance: Min guard Gait Distance (Feet): 130 Feet Assistive device: Rolling walker (2 wheeled) Gait Pattern/deviations: Step-through pattern;Decreased step length - left;Decreased stride length Gait velocity: decreased   General Gait Details: pt with good step through sequencing and maintained safe hand placement throughout, no overt LOB noted   Stairs             Wheelchair Mobility     Modified Rankin (Stroke Patients Only)       Balance Overall balance assessment: Needs assistance Sitting-balance support: No upper extremity supported;Feet supported;Bilateral upper extremity supported Sitting balance-Leahy Scale: Good     Standing balance support: During functional activity;Bilateral upper extremity supported Standing balance-Leahy Scale: Fair Standing balance comment: requries support for standing and gait                            Cognition Arousal/Alertness: Awake/alert Behavior During Therapy: WFL for tasks assessed/performed Overall Cognitive Status: Within Functional Limits for tasks assessed                                        Exercises Total Joint Exercises Ankle Circles/Pumps: AROM;10 reps;Seated;Both Quad Sets: AROM;Left;5 reps;Supine Short Arc Quad: AROM;Left;10 reps;Supine Heel Slides: AAROM;Left;10 reps;Supine Hip ABduction/ADduction: AAROM;Left;10 reps;Supine    General Comments        Pertinent Vitals/Pain Pain Assessment: 0-10 Pain Score: 3  Pain Location: L hip with movement Pain Descriptors / Indicators: Sore Pain Intervention(s): Limited activity within patient's tolerance;Monitored during session;Premedicated before session;Ice applied    Home Living                      Prior Function            PT Goals (current goals can now be found in the care plan section) Acute Rehab PT Goals Patient Stated Goal: to get home and back to ambulating with [facility], care for wife who  is WC bound PT Goal Formulation: With patient Time For Goal Achievement: 02/11/19 Potential to Achieve Goals: Good Progress towards PT goals: Progressing toward goals    Frequency    7X/week      PT Plan Current plan remains appropriate    Co-evaluation              AM-PAC PT "6 Clicks" Mobility   Outcome Measure  Help needed turning from your back to your side while in a flat bed without using  bedrails?: A Little Help needed moving from lying on your back to sitting on the side of a flat bed without using bedrails?: A Little Help needed moving to and from a bed to a chair (including a wheelchair)?: A Little Help needed standing up from a chair using your arms (e.g., wheelchair or bedside chair)?: A Little Help needed to walk in hospital room?: A Little Help needed climbing 3-5 steps with a railing? : A Lot 6 Click Score: 17    End of Session Equipment Utilized During Treatment: Gait belt Activity Tolerance: Patient tolerated treatment well Patient left: in chair;with call bell/phone within reach;with chair alarm set Nurse Communication: Mobility status PT Visit Diagnosis: Muscle weakness (generalized) (M62.81);Difficulty in walking, not elsewhere classified (R26.2)     Time: [id number] PT Time Calculation (min) (ACUTE ONLY): 29 min  Charges:  $Gait Training: 8-22 mins $Therapeutic Exercise: 8-22 mins                     Blondell Reveal Kistler PT 02/12/2019  Acute Rehabilitation Services Pager [phone] Office [phone]

## 2019-02-12 NOTE — Progress Notes (Signed)
Physical Therapy Treatment Patient Details Name: James [name] MRN: [id number] DOB: April 12, 1956 Today's Date: 02/12/2019    History of Present Illness Patient is 63 y.o. male s/p Lt THA posterolateral approach with PMH significant for OA, obesity, and HTN.    PT Comments    Pt is progressing well with mobility, he ambulated 180' with RW, no loss of balance. Pt demonstrates good understanding of posterior hip precautions.   Follow Up Recommendations  Follow surgeon's recommendation for DC plan and follow-up therapies;Home health PT     Equipment Recommendations  None recommended by PT    Recommendations for Other Services       Precautions / Restrictions Precautions Precautions: Fall;Posterior Hip Precaution Booklet Issued: Yes (comment) Precaution Comments: reviewed posterior precautions in detail, issued handout Restrictions Weight Bearing Restrictions: No Other Position/Activity Restrictions: WBAT    Mobility  Bed Mobility Overal bed mobility: Needs Assistance Bed Mobility: Supine to Sit     Supine to sit: Min assist;HOB elevated     General bed mobility comments: assist to manage bil LE's and to raise trunk, cues to use bed rails and assist to steady upon sitting EOB  Transfers Overall transfer level: Needs assistance Equipment used: Rolling walker (2 wheeled) Transfers: Sit to/from Stand Sit to Stand: Min guard         General transfer comment: cues for safe hand placement and technique with RW  Ambulation/Gait Ambulation/Gait assistance: Min guard Gait Distance (Feet): 180 Feet Assistive device: Rolling walker (2 wheeled) Gait Pattern/deviations: Step-through pattern;Decreased step length - left;Decreased stride length Gait velocity: decreased   General Gait Details: pt with good step through sequencing and maintained safe hand placement throughout, no overt LOB noted   Stairs             Wheelchair Mobility    Modified Rankin (Stroke  Patients Only)       Balance Overall balance assessment: Needs assistance Sitting-balance support: No upper extremity supported;Feet supported;Bilateral upper extremity supported Sitting balance-Leahy Scale: Good     Standing balance support: During functional activity;Bilateral upper extremity supported Standing balance-Leahy Scale: Fair Standing balance comment: requries support for standing and gait                            Cognition Arousal/Alertness: Awake/alert Behavior During Therapy: WFL for tasks assessed/performed Overall Cognitive Status: Within Functional Limits for tasks assessed                                        Exercises Total Joint Exercises Ankle Circles/Pumps: AROM;10 reps;Seated;Both Quad Sets: AROM;Left;5 reps;Supine Short Arc Quad: AROM;Left;10 reps;Supine Heel Slides: AAROM;Left;10 reps;Supine Hip ABduction/ADduction: AAROM;Left;10 reps;Supine    General Comments        Pertinent Vitals/Pain Pain Assessment: 0-10 Pain Score: 3  Pain Location: L hip with movement Pain Descriptors / Indicators: Sore Pain Intervention(s): Limited activity within patient's tolerance;Monitored during session;Patient requesting pain meds-RN notified;Ice applied    Home Living                      Prior Function            PT Goals (current goals can now be found in the care plan section) Acute Rehab PT Goals Patient Stated Goal: to get home and back to ambulating with [facility], care for wife who is WC  bound PT Goal Formulation: With patient Time For Goal Achievement: 02/11/19 Potential to Achieve Goals: Good Progress towards PT goals: Progressing toward goals    Frequency    7X/week      PT Plan Current plan remains appropriate    Co-evaluation              AM-PAC PT "6 Clicks" Mobility   Outcome Measure  Help needed turning from your back to your side while in a flat bed without using bedrails?: A  Little Help needed moving from lying on your back to sitting on the side of a flat bed without using bedrails?: A Little Help needed moving to and from a bed to a chair (including a wheelchair)?: A Little Help needed standing up from a chair using your arms (e.g., wheelchair or bedside chair)?: A Little Help needed to walk in hospital room?: A Little Help needed climbing 3-5 steps with a railing? : A Lot 6 Click Score: 17    End of Session Equipment Utilized During Treatment: Gait belt Activity Tolerance: Patient tolerated treatment well Patient left: in chair;with call bell/phone within reach;with chair alarm set Nurse Communication: Mobility status PT Visit Diagnosis: Muscle weakness (generalized) (M62.81);Difficulty in walking, not elsewhere classified (R26.2)     Time: [id number] PT Time Calculation (min) (ACUTE ONLY): 20 min  Charges:  $Gait Training: 8-22 mins      Blondell Reveal Kistler PT 02/12/2019  Acute Rehabilitation Services Pager [phone] Office [phone]

## 2019-02-12 NOTE — Progress Notes (Signed)
   Subjective: 1 Day Post-Op Procedure(s) (LRB): TOTAL HIP ARTHROPLASTY-POSTERIOR (Left) Patient reports pain as moderate.   Patient seen in rounds with Dr. Wynelle Link. Patient is well, and has had no acute complaints or problems other than discomfort in the left hip. No SOB or chest pain. Voiding well. Positive flatus.  Plan is to go Home after hospital stay.  Objective: Vital signs in last 24 hours: Temp:  [97.3 F (36.3 C)-98.6 F (37 C)] 98.5 F (36.9 C) (10/08 0539) Pulse Rate:  [57-89] 63 (10/08 0539) Resp:  [11-18] 16 (10/08 0539) BP: (99-156)/(46-93) 149/83 (10/08 0539) SpO2:  [94 %-100 %] 99 % (10/08 0539) Weight:  [140.2 kg] 140.2 kg (10/07 0942)  Intake/Output from previous day:  Intake/Output Summary (Last 24 hours) at 02/12/2019 0757 Last data filed at 02/12/2019 0537 Gross per 24 hour  Intake 4343.09 ml  Output 4165 ml  Net 178.09 ml     Labs: Recent Labs    02/12/19 0238  HGB 12.3*   Recent Labs    02/12/19 0238  WBC 7.9  RBC 4.09*  HCT 37.7*  PLT 149*   Recent Labs    02/12/19 0238  NA 135  K 3.8  CL 99  CO2 24  BUN 13  CREATININE 0.70  GLUCOSE 155*  CALCIUM 8.1*    EXAM General - Patient is Alert and Oriented Extremity - Neurologically intact Intact pulses distally Dorsiflexion/Plantar flexion intact No cellulitis present Compartment soft Dressing - dressing C/D/I Motor Function - intact, moving foot and toes well on exam.  Hemovac pulled without difficulty.  Past Medical History:  Diagnosis Date  . Arthritis   . Chicken pox   . Complication of anesthesia    difficulty waking up  . Hypertension   . Joint pain   . Obesity   . Osteoarthritis   . Prediabetes   . Swelling    feet and legs    Assessment/Plan: 1 Day Post-Op Procedure(s) (LRB): TOTAL HIP ARTHROPLASTY-POSTERIOR (Left) Principal Problem:   OA (osteoarthritis) of hip  Estimated body mass index is 49.87 kg/m as calculated from the following:   Height as of  this encounter: 5\' 6"  (1.676 m).   Weight as of this encounter: 140.2 kg. Advance diet Up with therapy D/C IV fluids when tolerating POs well Plan for discharge tomorrow  DVT Prophylaxis - Xarelto Weight Bearing As Tolerated  D/C Knee Immobilizer Hemovac Pulled Hip Precautions   Continue therapy today. Hopeful for DC home tomorrow with HHPT. Will change dressing to Aquacel tomorrow.   Ardeen Jourdain, PA-C Orthopaedic Surgery 02/12/2019, 7:57 AM

## 2019-02-13 LAB — BASIC METABOLIC PANEL
Anion gap: 11 (ref 5–15)
BUN: 15 mg/dL (ref 8–23)
CO2: 26 mmol/L (ref 22–32)
Calcium: 8.4 mg/dL — ABNORMAL LOW (ref 8.9–10.3)
Chloride: 98 mmol/L (ref 98–111)
Creatinine, Ser: 0.68 mg/dL (ref 0.61–1.24)
GFR calc Af Amer: >60 mL/min (ref >60)
GFR calc non Af Amer: >60 mL/min (ref >60)
Glucose, Bld: 145 mg/dL — ABNORMAL HIGH (ref 70–99)
Potassium: 3.9 mmol/L (ref 3.5–5.1)
Sodium: 135 mmol/L (ref 135–145)

## 2019-02-13 LAB — CBC
HCT: 37.7 % — ABNORMAL LOW (ref 39.0–52.0)
Hemoglobin: 12.2 g/dL — ABNORMAL LOW (ref 13.0–17.0)
MCH: 30 pg (ref 26.0–34.0)
MCHC: 32.4 g/dL (ref 30.0–36.0)
MCV: 92.9 fL (ref 80.0–100.0)
Platelets: 172 10*3/uL (ref 150–400)
RBC: 4.06 MIL/uL — ABNORMAL LOW (ref 4.22–5.81)
RDW: 13.2 % (ref 11.5–15.5)
WBC: 10.5 10*3/uL (ref 4.0–10.5)
nRBC: 0 % (ref 0.0–0.2)

## 2019-02-13 MED ORDER — HYDROCODONE-ACETAMINOPHEN 5-325 MG PO TABS: 1.0000 | ORAL_TABLET | Freq: Four times a day (QID) | ORAL | 0 refills | Status: DC | PRN

## 2019-02-13 MED ORDER — RIVAROXABAN 10 MG PO TABS: 10.0000 mg | ORAL_TABLET | Freq: Every day | ORAL | 0 refills | Status: DC

## 2019-02-13 MED ORDER — METHOCARBAMOL 500 MG PO TABS: 500.0000 mg | ORAL_TABLET | Freq: Four times a day (QID) | ORAL | 0 refills | Status: DC | PRN

## 2019-02-13 MED ORDER — TRAMADOL HCL 50 MG PO TABS: 50.0000 mg | ORAL_TABLET | Freq: Four times a day (QID) | ORAL | 0 refills | Status: DC | PRN

## 2019-02-13 MED ORDER — BUPIVACAINE IN DEXTROSE 0.75-8.25 % IT SOLN
INTRATHECAL | Status: DC | PRN
  Administered 2019-02-11: 2 mL via INTRATHECAL

## 2019-02-13 NOTE — Plan of Care (Signed)
Patient discharged home in stable condition 

## 2019-02-13 NOTE — TOC Transition Note (Signed)
Transition of Care [facility]) - CM/SW Discharge Note   Patient Details  Name: James [name] MRN: [id number] Date of Birth: [birth year]  Transition of Care [facility]) CM/SW Contact:  Leeroy Cha, RN Phone Number: 02/13/2019, 8:52 AM   Clinical Narrative:    dcd to home [facility]   Final next level of care: [place] Barriers to Discharge: No Barriers Identified   Patient Goals and CMS Choice        Discharge Placement                       Discharge Plan and Services   Discharge Planning Services: CM Consult Post Acute Care Choice: Home Health                    HH Arranged: PT [place]: [facility] ([facility]) Date [place]: 02/13/19 Time [place]: [phone] Representative spoke with at [place]: mike  Social Determinants of Health ([place]) Interventions     Readmission Risk Interventions No flowsheet data found.

## 2019-02-13 NOTE — Progress Notes (Signed)
Patient slept through most of the shift. Vitals stable.

## 2019-02-13 NOTE — Progress Notes (Signed)
   Subjective: 2 Days Post-Op Procedure(s) (LRB): TOTAL HIP ARTHROPLASTY-POSTERIOR (Left) Patient reports pain as mild.   Patient seen in rounds for Dr. Wynelle Link. Patient is well, and has had no acute complaints or problems other than discomfort in the left hip. No acute events overnight. Patient states he slept well last night. He is voiding without difficulty, passing flatus. He denies N/V, abdominal pain, CP, SHOB. He states he is ready to go home today.  We will continue therapy today.   Objective: Vital signs in last 24 hours: Temp:  [97.5 F (36.4 C)-97.9 F (36.6 C)] 97.9 F (36.6 C) (10/09 0535) Pulse Rate:  [70-71] 71 (10/09 0535) Resp:  [14-18] 14 (10/09 0535) BP: (118-141)/(69-86) 141/86 (10/09 0535) SpO2:  [95 %-100 %] 97 % (10/09 0535)  Intake/Output from previous day:  Intake/Output Summary (Last 24 hours) at 02/13/2019 0857 Last data filed at 02/13/2019 0800 Gross per 24 hour  Intake 120 ml  Output 1675 ml  Net -1555 ml     Intake/Output this shift: Total I/O In: -  Out: 525 [Urine:525]  Labs: Recent Labs    02/12/19 0238 02/13/19 0244  HGB 12.3* 12.2*   Recent Labs    02/12/19 0238 02/13/19 0244  WBC 7.9 10.5  RBC 4.09* 4.06*  HCT 37.7* 37.7*  PLT 149* 172   Recent Labs    02/12/19 0238 02/13/19 0244  NA 135 135  K 3.8 3.9  CL 99 98  CO2 24 26  BUN 13 15  CREATININE 0.70 0.68  GLUCOSE 155* 145*  CALCIUM 8.1* 8.4*   No results for input(s): LABPT, INR in the last 72 hours.  Exam: General - Patient is Alert and Oriented Extremity - Neurologically intact Sensation intact distally Intact pulses distally Dorsiflexion/Plantar flexion intact Dressing - dressing C/D/I Motor Function - intact, moving foot and toes well on exam.   Past Medical History:  Diagnosis Date  . Arthritis   . Chicken pox   . Complication of anesthesia    difficulty waking up  . Hypertension   . Joint pain   . Obesity   . Osteoarthritis   . Prediabetes    . Swelling    feet and legs    Assessment/Plan: 2 Days Post-Op Procedure(s) (LRB): TOTAL HIP ARTHROPLASTY-POSTERIOR (Left) Principal Problem:   OA (osteoarthritis) of hip  Estimated body mass index is 49.87 kg/m as calculated from the following:   Height as of this encounter: 5\' 6"  (1.676 m).   Weight as of this encounter: 140.2 kg. Advance diet Up with therapy D/C IV fluids  DVT Prophylaxis - Xarelto Weight bearing as tolerated Hip precautions discussed with patient  Plan is to go Home after hospital stay with HHPT. Order placed for HHPT. Plan for discharge today after 1 session of therapy as long as he continues to meet his goals. Dressing changed to [place] today. Patient instructed that he may remove this dressing after 1 week. He will follow up in the office with [name] in 2 weeks for recheck.   Griffith Citron, PA-C Orthopedic Surgery [phone] 02/13/2019, 8:57 AM

## 2019-02-13 NOTE — Anesthesia Procedure Notes (Signed)
Spinal  Patient location during procedure: OR Start time: 02/13/2019 11:28 AM End time: 02/13/2019 11:35 AM Staffing Anesthesiologist: Myrtie Soman, MD Performed: anesthesiologist  Preanesthetic Checklist Completed: patient identified, site marked, surgical consent, pre-op evaluation, timeout performed, IV checked, risks and benefits discussed and monitors and equipment checked Spinal Block Patient position: sitting Prep: Betadine Patient monitoring: heart rate, continuous pulse ox and blood pressure Approach: midline Location: L3-4 Injection technique: single-shot Needle Needle type: Spinocan  Needle gauge: 22 G Needle length: 12.7 cm Additional Notes Expiration date of kit checked and confirmed. Patient tolerated procedure well, without complications.

## 2019-02-13 NOTE — Addendum Note (Signed)
Addendum  created 02/13/19 0941 by Myrtie Soman, MD   Child order released for a procedure order, Clinical Note Signed, Intraprocedure Blocks edited, Intraprocedure Meds edited

## 2019-02-13 NOTE — Progress Notes (Signed)
Physical Therapy Treatment Patient Details Name: James [name] MRN: [id number] DOB: 1955/06/22 Today's Date: 02/13/2019    History of Present Illness Patient is 63 y.o. male s/p Lt THA posterolateral approach with PMH significant for OA, obesity, and HTN.    PT Comments    Pt progressing well with mobility and eager for dc home.  Reviewed bed mobility, therex, THP and car transfers.   Follow Up Recommendations  Follow surgeon's recommendation for DC plan and follow-up therapies;Home health PT     Equipment Recommendations  None recommended by PT    Recommendations for Other Services       Precautions / Restrictions Precautions Precautions: Fall;Posterior Hip Precaution Booklet Issued: Yes (comment) Precaution Comments: pt recalls 2/3 THP - all reviewed Restrictions Weight Bearing Restrictions: No Other Position/Activity Restrictions: WBAT    Mobility  Bed Mobility Overal bed mobility: Needs Assistance Bed Mobility: Supine to Sit     Supine to sit: Supervision     General bed mobility comments: min cues for sequence and use of bedrail to self assist.  Pt states he can have son get bedrail for home  Transfers Overall transfer level: Needs assistance Equipment used: Rolling walker (2 wheeled) Transfers: Sit to/from Stand Sit to Stand: Supervision         General transfer comment: pt self-cues for safe hand placement and technique with RW  Ambulation/Gait Ambulation/Gait assistance: Min guard;Supervision Gait Distance (Feet): 250 Feet Assistive device: Rolling walker (2 wheeled) Gait Pattern/deviations: Step-through pattern;Decreased step length - left;Decreased stride length Gait velocity: decreased   General Gait Details: cues for position from RW and to correct increased IR on L pt with good step through sequencing and maintained safe hand placement throughout, no overt LOB noted   Stairs             Wheelchair Mobility    Modified Rankin  (Stroke Patients Only)       Balance Overall balance assessment: Needs assistance Sitting-balance support: No upper extremity supported;Feet supported;Bilateral upper extremity supported Sitting balance-Leahy Scale: Good     Standing balance support: During functional activity;Bilateral upper extremity supported Standing balance-Leahy Scale: Fair                              Cognition Arousal/Alertness: Awake/alert Behavior During Therapy: WFL for tasks assessed/performed Overall Cognitive Status: Within Functional Limits for tasks assessed                                        Exercises Total Joint Exercises Ankle Circles/Pumps: AROM;Both;15 reps;Supine Quad Sets: AROM;Left;Supine;10 reps Heel Slides: AAROM;Left;Supine;20 reps Hip ABduction/ADduction: AAROM;Left;Supine;20 reps Long Arc Quad: AROM;Left;10 reps;Seated    General Comments        Pertinent Vitals/Pain Pain Assessment: 0-10 Pain Score: 4  Pain Location: L hip with movement Pain Descriptors / Indicators: Sore Pain Intervention(s): Limited activity within patient's tolerance;Monitored during session;Premedicated before session;Ice applied    Home Living                      Prior Function            PT Goals (current goals can now be found in the care plan section) Acute Rehab PT Goals Patient Stated Goal: to get home and back to ambulating with [facility], care for wife who is WC bound PT  Goal Formulation: With patient Time For Goal Achievement: 02/11/19 Potential to Achieve Goals: Good Progress towards PT goals: Progressing toward goals    Frequency    7X/week      PT Plan Current plan remains appropriate    Co-evaluation              AM-PAC PT "6 Clicks" Mobility   Outcome Measure  Help needed turning from your back to your side while in a flat bed without using bedrails?: A Little Help needed moving from lying on your back to sitting on the side  of a flat bed without using bedrails?: A Little Help needed moving to and from a bed to a chair (including a wheelchair)?: A Little Help needed standing up from a chair using your arms (e.g., wheelchair or bedside chair)?: A Little Help needed to walk in hospital room?: A Little Help needed climbing 3-5 steps with a railing? : A Little 6 Click Score: 18    End of Session Equipment Utilized During Treatment: Gait belt Activity Tolerance: Patient tolerated treatment well Patient left: in chair;with call bell/phone within reach;with chair alarm set Nurse Communication: Mobility status PT Visit Diagnosis: Muscle weakness (generalized) (M62.81);Difficulty in walking, not elsewhere classified (R26.2)     Time: [id number] PT Time Calculation (min) (ACUTE ONLY): 38 min  Charges:  $Gait Training: 8-22 mins $Therapeutic Exercise: 8-22 mins $Therapeutic Activity: 8-22 mins                     [place] Pager [phone] Office [phone]    James [name] 02/13/2019, 10:44 AM

## 2019-02-14 DIAGNOSIS — Z471 Aftercare following joint replacement surgery: Secondary | ICD-10-CM | POA: Diagnosis not present

## 2019-02-14 DIAGNOSIS — I10 Essential (primary) hypertension: Secondary | ICD-10-CM | POA: Diagnosis not present

## 2019-02-14 DIAGNOSIS — Z96642 Presence of left artificial hip joint: Secondary | ICD-10-CM | POA: Diagnosis not present

## 2019-02-14 DIAGNOSIS — R7303 Prediabetes: Secondary | ICD-10-CM | POA: Diagnosis not present

## 2019-02-14 DIAGNOSIS — Z87891 Personal history of nicotine dependence: Secondary | ICD-10-CM | POA: Diagnosis not present

## 2019-02-14 DIAGNOSIS — Z6841 Body Mass Index (BMI) 40.0 and over, adult: Secondary | ICD-10-CM | POA: Diagnosis not present

## 2019-02-14 DIAGNOSIS — E559 Vitamin D deficiency, unspecified: Secondary | ICD-10-CM | POA: Diagnosis not present

## 2019-02-14 DIAGNOSIS — E7849 Other hyperlipidemia: Secondary | ICD-10-CM | POA: Diagnosis not present

## 2019-02-15 ENCOUNTER — Encounter: Payer: Self-pay | Admitting: Adult Health

## 2019-02-16 ENCOUNTER — Encounter (HOSPITAL_COMMUNITY): Payer: Self-pay | Admitting: Orthopedic Surgery

## 2019-02-17 NOTE — Discharge Summary (Signed)
Physician Discharge Summary   Patient ID: James [name] MRN: [id number] DOB/AGE: Apr 22, 1956 63 y.o.  Admit date: 02/11/2019 Discharge date: 02/13/2019  Primary Diagnosis: Osteoarthritis, left hip  Admission Diagnoses:  Past Medical History:  Diagnosis Date  . Arthritis   . Chicken pox   . Complication of anesthesia    difficulty waking up  . Hypertension   . Joint pain   . Obesity   . Osteoarthritis   . Prediabetes   . Swelling    feet and legs   Discharge Diagnoses:   Principal Problem:   OA (osteoarthritis) of hip  Estimated body mass index is 49.87 kg/m as calculated from the following:   Height as of this encounter: 5\' 6"  (1.676 m).   Weight as of this encounter: 140.2 kg.  Procedure:  Procedure(s) (LRB): TOTAL HIP ARTHROPLASTY-POSTERIOR (Left)   Consults: None  HPI: James [name] is a 63 y.o. male with end stage arthritis of his left hip with progressively worsening pain and dysfunction. Pain occurs with activity and rest including pain at night. He has tried analgesics, protected weight bearing and rest without benefit. Pain is too severe to attempt physical therapy. Radiographs demonstrate bone on bone arthritis with subchondral cyst formation. He presents now for left THA.  Laboratory Data: Admission on 02/11/2019, Discharged on 02/13/2019  Component Date Value Ref Range Status  . ABO/RH(D) 02/04/2019    Final                   Value:A POS Performed at [facility], [place] [address], [place], [place] [address]   . WBC 02/12/2019 7.9  4.0 - 10.5 K/uL Final  . RBC 02/12/2019 4.09* 4.22 - 5.81 MIL/uL Final  . Hemoglobin 02/12/2019 12.3* 13.0 - 17.0 g/dL Final  . HCT 02/12/2019 37.7* 39.0 - 52.0 % Final  . MCV 02/12/2019 92.2  80.0 - 100.0 fL Final  . MCH 02/12/2019 30.1  26.0 - 34.0 pg Final  . MCHC 02/12/2019 32.6  30.0 - 36.0 g/dL Final  . RDW 02/12/2019 13.0  11.5 - 15.5 % Final  . Platelets 02/12/2019 149* 150 - 400 K/uL Final  .  nRBC 02/12/2019 0.0  0.0 - 0.2 % Final   Performed at [facility], [place] [address], [place], [place] [address]  . Sodium 02/12/2019 135  135 - 145 mmol/L Final  . Potassium 02/12/2019 3.8  3.5 - 5.1 mmol/L Final  . Chloride 02/12/2019 99  98 - 111 mmol/L Final  . CO2 02/12/2019 24  22 - 32 mmol/L Final  . Glucose, Bld 02/12/2019 155* 70 - 99 mg/dL Final  . BUN 02/12/2019 13  8 - 23 mg/dL Final  . Creatinine, Ser 02/12/2019 0.70  0.61 - 1.24 mg/dL Final  . Calcium 02/12/2019 8.1* 8.9 - 10.3 mg/dL Final  . GFR calc non Af Amer 02/12/2019 >60  >60 mL/min Final  . GFR calc Af Amer 02/12/2019 >60  >60 mL/min Final  . Anion gap 02/12/2019 12  5 - 15 Final   Performed at [facility], [place] [address] [place], [place] [address]  . WBC 02/13/2019 10.5  4.0 - 10.5 K/uL Final  . RBC 02/13/2019 4.06* 4.22 - 5.81 MIL/uL Final  . Hemoglobin 02/13/2019 12.2* 13.0 - 17.0 g/dL Final  . HCT 02/13/2019 37.7* 39.0 - 52.0 % Final  . MCV 02/13/2019 92.9  80.0 - 100.0 fL Final  . MCH 02/13/2019 30.0  26.0 - 34.0 pg Final  .  MCHC 02/13/2019 32.4  30.0 - 36.0 g/dL Final  . RDW 02/13/2019 13.2  11.5 - 15.5 % Final  . Platelets 02/13/2019 172  150 - 400 K/uL Final  . nRBC 02/13/2019 0.0  0.0 - 0.2 % Final   Performed at [facility], [place] [address] [place], [place] [address]  . Sodium 02/13/2019 135  135 - 145 mmol/L Final  . Potassium 02/13/2019 3.9  3.5 - 5.1 mmol/L Final  . Chloride 02/13/2019 98  98 - 111 mmol/L Final  . CO2 02/13/2019 26  22 - 32 mmol/L Final  . Glucose, Bld 02/13/2019 145* 70 - 99 mg/dL Final  . BUN 02/13/2019 15  8 - 23 mg/dL Final  . Creatinine, Ser 02/13/2019 0.68  0.61 - 1.24 mg/dL Final  . Calcium 02/13/2019 8.4* 8.9 - 10.3 mg/dL Final  . GFR calc non Af Amer 02/13/2019 >60  >60 mL/min Final  . GFR calc Af Amer 02/13/2019 >60  >60 mL/min Final  . Anion gap 02/13/2019 11  5 - 15 Final   Performed at [facility], [place] [address], [place], [place] [address]  Hospital Outpatient Visit on 02/07/2019  Component Date Value Ref Range Status  . SARS-CoV-2, NAA 02/07/2019 NOT DETECTED  NOT DETECTED Final   Comment: (NOTE) This nucleic acid amplification test was developed and its performance characteristics determined by [employer]. Nucleic acid amplification tests include PCR and TMA. This test has not been FDA cleared or approved. This test has been authorized by FDA under an Emergency Use Authorization (EUA). This test is only authorized for the duration of time the declaration that circumstances exist justifying the authorization of the emergency use of in vitro diagnostic tests for detection of SARS-CoV-2 virus and/or diagnosis of COVID-19 infection under section 564(b)(1) of the Act, 21 U.S.C. [id number]) (1), unless the authorization is terminated or revoked sooner. When diagnostic testing is negative, the possibility of a false negative result should be considered in the context of a patient's recent exposures and the presence of clinical signs and symptoms consistent with COVID-19. An individual without symptoms of COVID- 19 and who is not shedding SARS-CoV-2 vi                          rus would expect to have a negative (not detected) result in this assay. Performed At: [facility] [place], [place] [id number] Rush Farmer MD [id number]   . Coronavirus Source 02/07/2019 NASOPHARYNGEAL   Final   Performed at [facility], [place] [address] [place], [place] [address]  Hospital Outpatient Visit on 02/04/2019  Component Date Value Ref Range Status  . MRSA, PCR 02/04/2019 NEGATIVE  NEGATIVE Final  . Staphylococcus aureus 02/04/2019 NEGATIVE  NEGATIVE Final   Comment: (NOTE) The Xpert SA Assay (FDA approved for NASAL specimens in patients 75 years of age and older), is one component of a comprehensive surveillance program. It is not intended  to diagnose infection nor to guide or monitor treatment. Performed at [facility], [place] [address] [place], [place] [address]   . aPTT 02/04/2019 28  24 - 36 seconds Final   Performed at [facility], [place] [address], [place], [place] [address]  . WBC 02/04/2019 6.8  4.0 - 10.5 K/uL Final  . RBC 02/04/2019 4.81  4.22 - 5.81 MIL/uL Final  . Hemoglobin 02/04/2019 14.5  13.0 - 17.0 g/dL Final  .  HCT 02/04/2019 44.8  39.0 - 52.0 % Final  . MCV 02/04/2019 93.1  80.0 - 100.0 fL Final  . MCH 02/04/2019 30.1  26.0 - 34.0 pg Final  . MCHC 02/04/2019 32.4  30.0 - 36.0 g/dL Final  . RDW 02/04/2019 13.5  11.5 - 15.5 % Final  . Platelets 02/04/2019 193  150 - 400 K/uL Final  . nRBC 02/04/2019 0.0  0.0 - 0.2 % Final   Performed at [facility], [place] [address] [place], [place] [address]  . Sodium 02/04/2019 137  135 - 145 mmol/L Final  . Potassium 02/04/2019 4.0  3.5 - 5.1 mmol/L Final  . Chloride 02/04/2019 99  98 - 111 mmol/L Final  . CO2 02/04/2019 29  22 - 32 mmol/L Final  . Glucose, Bld 02/04/2019 117* 70 - 99 mg/dL Final  . BUN 02/04/2019 21  8 - 23 mg/dL Final  . Creatinine, Ser 02/04/2019 0.89  0.61 - 1.24 mg/dL Final  . Calcium 02/04/2019 9.5  8.9 - 10.3 mg/dL Final  . Total Protein 02/04/2019 7.6  6.5 - 8.1 g/dL Final  . Albumin 02/04/2019 4.3  3.5 - 5.0 g/dL Final  . AST 02/04/2019 20  15 - 41 U/L Final  . ALT 02/04/2019 24  0 - 44 U/L Final  . Alkaline Phosphatase 02/04/2019 49  38 - 126 U/L Final  . Total Bilirubin 02/04/2019 0.8  0.3 - 1.2 mg/dL Final  . GFR calc non Af Amer 02/04/2019 >60  >60 mL/min Final  . GFR calc Af Amer 02/04/2019 >60  >60 mL/min Final  . Anion gap 02/04/2019 9  5 - 15 Final   Performed at [facility], [place] [address] [place], [place] [address]  . Prothrombin Time 02/04/2019 13.4  11.4 - 15.2 seconds Final  . INR 02/04/2019 1.0  0.8 - 1.2 Final   Comment: (NOTE) INR goal varies  based on device and disease states. Performed at [facility], [place] [address], [place], [place] [address]   . ABO/RH(D) 02/04/2019 A POS   Final  . Antibody Screen 02/04/2019 NEG   Final  . Sample Expiration 02/04/2019 02/14/2019,2359   Final  . Extend sample reason 02/04/2019    Final                   Value:NO TRANSFUSIONS OR PREGNANCY IN THE PAST 3 MONTHS Performed at [facility], [place] [address] [place], [place] [address]   Office Visit on 01/13/2019  Component Date Value Ref Range Status  . WBC 01/13/2019 5.2  4.0 - 10.5 K/uL Final  . RBC 01/13/2019 4.80  4.22 - 5.81 Mil/uL Final  . Hemoglobin 01/13/2019 14.6  13.0 - 17.0 g/dL Final  . HCT 01/13/2019 43.2  39.0 - 52.0 % Final  . MCV 01/13/2019 90.1  78.0 - 100.0 fl Final  . MCHC 01/13/2019 33.8  30.0 - 36.0 g/dL Final  . RDW 01/13/2019 13.9  11.5 - 15.5 % Final  . Platelets 01/13/2019 185.0  150.0 - 400.0 K/uL Final  . Neutrophils Relative % 01/13/2019 63.6  43.0 - 77.0 % Final  . Lymphocytes Relative 01/13/2019 26.9  12.0 - 46.0 % Final  . Monocytes Relative 01/13/2019 8.6  3.0 - 12.0 % Final  . Eosinophils Relative 01/13/2019 0.5  0.0 - 5.0 % Final  . Basophils Relative 01/13/2019 0.4  0.0 - 3.0 % Final  . Neutro Abs 01/13/2019 3.3  1.4 - 7.7 K/uL Final  .  Lymphs Abs 01/13/2019 1.4  0.7 - 4.0 K/uL Final  . Monocytes Absolute 01/13/2019 0.4  0.1 - 1.0 K/uL Final  . Eosinophils Absolute 01/13/2019 0.0  0.0 - 0.7 K/uL Final  . Basophils Absolute 01/13/2019 0.0  0.0 - 0.1 K/uL Final  . Sodium 01/13/2019 140  135 - 145 mEq/L Final  . Potassium 01/13/2019 4.3  3.5 - 5.1 mEq/L Final  . Chloride 01/13/2019 102  96 - 112 mEq/L Final  . CO2 01/13/2019 28  19 - 32 mEq/L Final  . Glucose, Bld 01/13/2019 87  70 - 99 mg/dL Final  . BUN 01/13/2019 20  6 - 23 mg/dL Final  . Creatinine, Ser 01/13/2019 0.86  0.40 - 1.50 mg/dL Final  . Total Bilirubin 01/13/2019 0.5  0.2 - 1.2 mg/dL Final  . Alkaline  Phosphatase 01/13/2019 52  39 - 117 U/L Final  . AST 01/13/2019 21  0 - 37 U/L Final  . ALT 01/13/2019 24  0 - 53 U/L Final  . Total Protein 01/13/2019 7.1  6.0 - 8.3 g/dL Final  . Albumin 01/13/2019 4.2  3.5 - 5.2 g/dL Final  . Calcium 01/13/2019 8.9  8.4 - 10.5 mg/dL Final  . GFR 01/13/2019 89.22  >60.00 mL/min Final  . Hgb A1c MFr Bld 01/13/2019 5.8  4.6 - 6.5 % Final   Glycemic Control Guidelines for People with Diabetes:Non Diabetic:  <6%Goal of Therapy: <7%Additional Action Suggested:  >8%   . Cholesterol 01/13/2019 187  0 - 200 mg/dL Final   ATP III Classification       Desirable:  < 200 mg/dL               Borderline High:  200 - 239 mg/dL          High:  > = 240 mg/dL  . Triglycerides 01/13/2019 64.0  0.0 - 149.0 mg/dL Final   Normal:  <150 mg/dLBorderline High:  150 - 199 mg/dL  . HDL 01/13/2019 51.90  >39.00 mg/dL Final  . VLDL 01/13/2019 12.8  0.0 - 40.0 mg/dL Final  . LDL Cholesterol 01/13/2019 122* 0 - 99 mg/dL Final  . Total CHOL/HDL Ratio 01/13/2019 4   Final                  Men          Women1/2 Average Risk     3.4          3.3Average Risk          5.0          4.42X Average Risk          9.6          7.13X Average Risk          15.0          11.0                      . NonHDL 01/13/2019 135.04   Final   NOTE:  Non-HDL goal should be 30 mg/dL higher than patient's LDL goal (i.e. LDL goal of < 70 mg/dL, would have non-HDL goal of < 100 mg/dL)  . TSH 01/13/2019 1.92  0.35 - 4.50 uIU/mL Final  . PSA 01/13/2019 1.30  0.10 - 4.00 ng/mL Final   Test performed using [employer] PSA Assay, a parmagnetic partical, chemiluminecent immunoassay.     X-Rays:Dg Pelvis Portable  Result Date: 02/11/2019 CLINICAL DATA:  Left total hip arthroplasty. EXAM: PORTABLE  PELVIS 1-2 VIEWS COMPARISON:  None. FINDINGS: The left hip demonstrates a total arthroplasty without evidence of hardware failure or complication. Mild irregularity of the greater trochanter is likely postsurgical. There is  expected intra-articular air. There is no fracture or dislocation. The alignment is anatomic. The right hip joint space is preserved. Post-surgical changes noted in the surrounding soft tissues with surgical drain in place. IMPRESSION: 1. Interval left total hip arthroplasty without acute postoperative complication. Electronically Signed   By: Titus Dubin M.D.   On: 02/11/2019 15:53    EKG: Orders placed or performed during the hospital encounter of 02/04/19  . EKG 12-Lead  . EKG 12-Lead  . EKG 12-Lead  . EKG 12-Lead     Hospital Course: James [name] is a 63 y.o. who was admitted to [facility]. They were brought to the operating room on 02/11/2019 and underwent Procedure(s): [place].  Patient tolerated the procedure well and was later transferred to the recovery room and then to the orthopaedic floor for postoperative care. They were given PO and IV analgesics for pain control following their surgery. They were given 24 hours of postoperative antibiotics of  Anti-infectives (From admission, onward)   Start     Dose/Rate Route Frequency Ordered Stop   02/11/19 1730  ceFAZolin (ANCEF) IVPB 2g/100 mL premix     2 g 200 mL/hr over 30 Minutes Intravenous Every 6 hours 02/11/19 1536 02/12/19 0010   02/11/19 0600  ceFAZolin (ANCEF) 3 g in dextrose 5 % 50 mL IVPB     3 g 100 mL/hr over 30 Minutes Intravenous On call to O.R. 02/10/19 [id number] 02/11/19 1114     and started on DVT prophylaxis in the form of Xarelto.   PT and OT were ordered for total joint protocol. Discharge planning consulted to help with postop disposition and equipment needs. Patient had a good night on the evening of surgery. They started to get up OOB with therapy on POD #0. Hemovac drain was pulled without difficulty on day one. Continued to work with therapy into POD #2. Pt was seen during rounds on day two and was ready to go home pending progress with therapy. Dressing was changed to  [place] and the incision was clean and intact. Pt worked with therapy for one additional session and was meeting their goals. He was discharged to home later that day in stable condition.  Diet: Regular diet Activity: WBAT Follow-up: in 2 weeks Disposition: Home Discharged Condition: good   Discharge Instructions    Call MD / Call 911   Complete by: As directed    If you experience chest pain or shortness of breath, CALL 911 and be transported to the hospital emergency room.  If you develope a fever above 101 F, pus (white drainage) or increased drainage or redness at the wound, or calf pain, call your surgeon's office.   Change dressing   Complete by: As directed    You may remove the waterproof dressing after 1 week.   Constipation Prevention   Complete by: As directed    Drink plenty of fluids.  Prune juice may be helpful.  You may use a stool softener, such as Colace (over the counter) 100 mg twice a day.  Use MiraLax (over the counter) for constipation as needed.   Diet - low sodium heart healthy   Complete by: As directed    Do not sit on low chairs, stoools or toilet seats, as it may be difficult  to get up from low surfaces   Complete by: As directed    Driving restrictions   Complete by: As directed    No driving for two weeks   Follow the hip precautions as taught in Physical Therapy   Complete by: As directed    TED hose   Complete by: As directed    Use stockings (TED hose) for three weeks on both leg(s).  You may remove them at night for sleeping.   Weight bearing as tolerated   Complete by: As directed      Allergies as of 02/13/2019      Reactions   Lipitor [atorvastatin] Other (See Comments)   Lightheaded      Medication List    STOP taking these medications   cyclobenzaprine 10 MG tablet Commonly known as: FLEXERIL   ibuprofen 200 MG tablet Commonly known as: ADVIL   multivitamin with minerals tablet     TAKE these medications   acetaminophen 500  MG tablet Commonly known as: TYLENOL Take 1,000 mg by mouth 2 (two) times daily.   ASPERCREME LIDOCAINE EX Apply 1 application topically 2 (two) times daily as needed (pain).   hydrochlorothiazide 25 MG tablet Commonly known as: HYDRODIURIL TAKE 1 TABLET BY MOUTH EVERY DAY   HYDROcodone-acetaminophen 5-325 MG tablet Commonly known as: NORCO/VICODIN Take 1-2 tablets by mouth every 6 (six) hours as needed for severe pain.   lisinopril 10 MG tablet Commonly known as: ZESTRIL Take 1 tablet (10 mg total) by mouth daily. What changed: Another medication with the same name was removed. Continue taking this medication, and follow the directions you see here.   methocarbamol 500 MG tablet Commonly known as: ROBAXIN Take 1 tablet (500 mg total) by mouth every 6 (six) hours as needed for muscle spasms.   rivaroxaban 10 MG Tabs tablet Commonly known as: XARELTO Take 1 tablet (10 mg total) by mouth daily with breakfast for 19 days. Take one tablet Xarelto once a day for three weeks following surgery. Then change to one baby Aspirin (81 mg) once a day for three weeks. Then discontinue Aspirin.   traMADol 50 MG tablet Commonly known as: ULTRAM Take 1-2 tablets (50-100 mg total) by mouth every 6 (six) hours as needed for moderate pain.            Discharge Care Instructions  (From admission, onward)         Start     Ordered   02/13/19 0000  Weight bearing as tolerated     02/13/19 0903   02/13/19 0000  Change dressing    Comments: You may remove the waterproof dressing after 1 week.   02/13/19 [id number]         Follow-up Information    Gaynelle Arabian, MD. Schedule an appointment as soon as possible for a visit on 02/26/2019.   Specialty: Orthopedic Surgery Contact information: [address] [place] [address] [id number]        [facility] Follow up.   Specialty: [facility] Contact information: [place] [address]  [phone]           Signed: Griffith Citron, PA-C Orthopedic Surgery 02/17/2019, 7:57 AM

## 2019-02-20 DIAGNOSIS — E7849 Other hyperlipidemia: Secondary | ICD-10-CM | POA: Diagnosis not present

## 2019-02-20 DIAGNOSIS — Z471 Aftercare following joint replacement surgery: Secondary | ICD-10-CM | POA: Diagnosis not present

## 2019-02-20 DIAGNOSIS — Z6841 Body Mass Index (BMI) 40.0 and over, adult: Secondary | ICD-10-CM | POA: Diagnosis not present

## 2019-02-20 DIAGNOSIS — E559 Vitamin D deficiency, unspecified: Secondary | ICD-10-CM | POA: Diagnosis not present

## 2019-02-20 DIAGNOSIS — Z87891 Personal history of nicotine dependence: Secondary | ICD-10-CM | POA: Diagnosis not present

## 2019-02-20 DIAGNOSIS — R7303 Prediabetes: Secondary | ICD-10-CM | POA: Diagnosis not present

## 2019-02-20 DIAGNOSIS — Z96642 Presence of left artificial hip joint: Secondary | ICD-10-CM | POA: Diagnosis not present

## 2019-02-20 DIAGNOSIS — I10 Essential (primary) hypertension: Secondary | ICD-10-CM | POA: Diagnosis not present

## 2019-03-02 ENCOUNTER — Encounter: Payer: Self-pay | Admitting: Adult Health

## 2019-03-03 ENCOUNTER — Other Ambulatory Visit: Payer: Self-pay | Admitting: Adult Health

## 2019-03-03 DIAGNOSIS — Z9189 Other specified personal risk factors, not elsewhere classified: Secondary | ICD-10-CM

## 2019-03-03 DIAGNOSIS — R7303 Prediabetes: Secondary | ICD-10-CM

## 2019-03-11 ENCOUNTER — Encounter: Payer: Self-pay | Admitting: Adult Health

## 2019-03-17 DIAGNOSIS — Z96642 Presence of left artificial hip joint: Secondary | ICD-10-CM | POA: Diagnosis not present

## 2019-03-17 DIAGNOSIS — Z471 Aftercare following joint replacement surgery: Secondary | ICD-10-CM | POA: Diagnosis not present

## 2019-04-01 ENCOUNTER — Other Ambulatory Visit: Payer: Self-pay

## 2019-06-04 ENCOUNTER — Ambulatory Visit: Payer: PPO

## 2019-06-05 ENCOUNTER — Encounter: Payer: Self-pay | Admitting: Registered"

## 2019-06-05 ENCOUNTER — Encounter: Payer: PPO | Attending: Adult Health | Admitting: Registered"

## 2019-06-05 DIAGNOSIS — R7303 Prediabetes: Secondary | ICD-10-CM | POA: Insufficient documentation

## 2019-06-05 NOTE — Progress Notes (Signed)
On 06/05/2019 patient completed Core Session 1 of Diabetes Prevention Program course virtually with [facility]. The following learning objectives were met by the patient during this class:   Learning Objectives:   Be able to explain the purpose and benefits of the National Diabetes Prevention Program.   Be able to describe the events that will take place at every session.   Know the weight loss and physical activity goals established by the [facility] Diabetes Prevention Program.   Know their own individual weight loss and physical activity goals.   Be able to explain the important effect of self-monitoring on behavior change.   Goals:  . Record food and beverage intake in "Food and Activity Tracker" over the next week.  . E-mail completed "Food and Activity Tracker" to Lifestyle Coach next week before session 2. . Circle the foods or beverages you think are highest in fat and calories in your food tracker. . Read the labels on the food you buy, and consider using measuring cups and spoons to help you calculate the amount you eat. We will talk about measuring in more detail in the coming weeks.   Follow-Up Plan:  Attend Core Session 2 next week.   E-mail completed "Food and Activity Tracker" to Lifestyle Coach next week before class

## 2019-06-12 ENCOUNTER — Encounter: Payer: PPO | Attending: Adult Health | Admitting: Registered"

## 2019-06-12 ENCOUNTER — Ambulatory Visit: Payer: PPO | Attending: Internal Medicine

## 2019-06-12 DIAGNOSIS — R7303 Prediabetes: Secondary | ICD-10-CM | POA: Insufficient documentation

## 2019-06-12 DIAGNOSIS — Z23 Encounter for immunization: Secondary | ICD-10-CM

## 2019-06-12 NOTE — Progress Notes (Signed)
   Covid-19 Vaccination Clinic  Name:  James [name]    MRN: [id number] DOB: 1955/10/26  06/12/2019  Mr. Renberg was observed post Covid-19 immunization for 15 minutes without incidence. He was provided with Vaccine Information Sheet and instruction to access the V-Safe system.   Mr. Szalkowski was instructed to call 911 with any severe reactions post vaccine: Marland Kitchen Difficulty breathing  . Swelling of your face and throat  . A fast heartbeat  . A bad rash all over your body  . Dizziness and weakness    Immunizations Administered    Name Date Dose VIS Date Route   Pfizer COVID-19 Vaccine 06/12/2019  5:31 PM 0.3 mL 04/17/2019 Intramuscular   Manufacturer: [place]   Lot: [id number]   [place]: [id number]

## 2019-06-15 ENCOUNTER — Other Ambulatory Visit: Payer: Self-pay

## 2019-06-15 ENCOUNTER — Encounter (HOSPITAL_BASED_OUTPATIENT_CLINIC_OR_DEPARTMENT_OTHER): Payer: PPO | Admitting: Registered"

## 2019-06-15 ENCOUNTER — Encounter: Payer: Self-pay | Admitting: Registered"

## 2019-06-15 DIAGNOSIS — R7303 Prediabetes: Secondary | ICD-10-CM | POA: Diagnosis not present

## 2019-06-15 NOTE — Progress Notes (Signed)
On 06/15/2019 patient completed Core Session 2 of Diabetes Prevention Program course virtually with [facility]. The following learning objectives were met by the patient during this class:   Learning Objectives:  Self-monitor their weight during the weeks following Session 2.   Describe the relationship between fat and calories.   Explain the reason for, and basic principles of, self-monitoring fat grams and calories.   Identify their personal fat gram goals.   Use the ?Fat and Calorie Counter to calculate the calories and fat grams of a given selection of foods.   Keep a running total of the fat grams they eat each day.   Calculate fat, calories, and serving sizes from nutrition labels.   Goals:   Weigh yourself at the same time each day, or every few days, and record your weight in your Food and Activity Tracker.  Write down everything you eat and drink in your Food and Activity Tracker.  Measure portions as much as you can, and start reading labels.   Use the ?Fat and Calorie Counter to figure out the amount of fat and calories in what you ate, and write the amount down in your Food and Activity Tracker.  Keep a running fat gram total throughout the day. Come as close to your fat gram goal as you can.   Follow-Up Plan:  Attend Core Session 3 next week.   Email completed  "Food and Activity Tracker" to Lifestyle Coach next week.

## 2019-06-19 ENCOUNTER — Encounter (HOSPITAL_BASED_OUTPATIENT_CLINIC_OR_DEPARTMENT_OTHER): Payer: PPO | Admitting: Registered"

## 2019-06-19 DIAGNOSIS — R7303 Prediabetes: Secondary | ICD-10-CM | POA: Diagnosis not present

## 2019-06-25 ENCOUNTER — Ambulatory Visit: Payer: PPO

## 2019-06-25 ENCOUNTER — Encounter: Payer: Self-pay | Admitting: Registered"

## 2019-06-25 NOTE — Progress Notes (Signed)
On 06/19/19 patient completed Core Session 3 of Diabetes Prevention Program course virtually with [facility]. The following learning objectives were met by the patient during this class:    Learning Objectives:  Weigh and measure foods.  Estimate the fat and calorie content of common foods.  Describe three ways to eat less fat and fewer calories.  Create a plan to eat less fat for the following week.   Goals:   Track weight when weighing outside of class.   Track food and beverages eaten each day in Food and Activity Tracker and include fat grams and calories for each.   Try to stay within fat gram goal.   Complete plan for eating less high fat foods and answer related homework questions.    Follow-Up Plan:  Attend Core Session 4 next week.   Bring completed "Food and Activity Tracker" next week to be reviewed by Lifestyle Coach.

## 2019-06-26 ENCOUNTER — Encounter: Payer: Self-pay | Admitting: Registered"

## 2019-06-26 ENCOUNTER — Encounter (HOSPITAL_BASED_OUTPATIENT_CLINIC_OR_DEPARTMENT_OTHER): Payer: PPO | Admitting: Registered"

## 2019-06-26 DIAGNOSIS — R7303 Prediabetes: Secondary | ICD-10-CM

## 2019-06-26 NOTE — Progress Notes (Signed)
On 06/26/19 patient completed Core Session 4 of Diabetes Prevention Program course virtually with [facility]. The following learning objectives were met by the patient during this class:    Learning Objectives:  Describe the MyPlate food guide and its recommendations, including how to reduce fat and calories in our diet.  Compare and contrast MyPlate guidelines with participants' eating habits.  List ways to replace high-fat and high-calorie foods with low-fat and low-calorie foods.  Explain the importance of eating plenty of whole grains, vegetables, and fruits, while staying within fat gram goals.  Explain the importance of eating foods from all groups of MyPlate and of eating a variety of foods from within each group.  Explain why a balanced diet is beneficial to health.  Goals:   Record weight taken outside of class.   Track foods and beverages eaten each day in the "Food and Activity Tracker," including calories and fat grams for each item.   Practice comparing what you eat with the recommendations of MyPlate using the "Rate Your Plate" handout.   Complete the "Rate Your Plate" handout form on at least 3 days.   Answer homework questions.   Follow-Up Plan:  Attend Core Session 5 next week.   Email completed "Food and Activity Tracker" next week to be reviewed by Lifestyle Coach.   

## 2019-07-03 ENCOUNTER — Encounter: Payer: Self-pay | Admitting: Registered"

## 2019-07-03 ENCOUNTER — Encounter (HOSPITAL_BASED_OUTPATIENT_CLINIC_OR_DEPARTMENT_OTHER): Payer: PPO | Admitting: Registered"

## 2019-07-03 DIAGNOSIS — R7303 Prediabetes: Secondary | ICD-10-CM | POA: Diagnosis not present

## 2019-07-03 NOTE — Progress Notes (Signed)
On 07/03/2019 patient completed Core Session 5 of Diabetes Prevention Program course virtually with [facility]. The following learning objectives were met by the patient during this class:   Learning Objectives:  Establish a physical activity goal.  Explain the importance of the physical activity goal.  Describe their current level of physical activity.  Name ways that they are already physically active.  Develop personal plans for physical activity for the next week.   Goals:   Record weight taken outside of class.   Track foods and beverages eaten each day in the "Food and Activity Tracker," including calories and fat grams for each item.   Make an Activity Plan including date, specific type of activity, and length of time you plan to be active that includes at last 60 minutes of activity for the week.   Track activity type, minutes you were active, and distance you reached each day in the "Food and Activity Tracker."   Follow-Up Plan: . Attend Core Session 6 next week.  . E-mail completed "Food and Activity Tracker" to Lifestyle Coach next week before class

## 2019-07-08 ENCOUNTER — Ambulatory Visit: Payer: PPO | Attending: Internal Medicine

## 2019-07-08 DIAGNOSIS — Z23 Encounter for immunization: Secondary | ICD-10-CM

## 2019-07-08 NOTE — Progress Notes (Signed)
   Covid-19 Vaccination Clinic  Name:  James [name]    MRN: [id number] DOB: 02-13-1956  07/08/2019  James [name] was observed post Covid-19 immunization for 15 minutes without incident. He was provided with Vaccine Information Sheet and instruction to access the V-Safe system.   James [name] was instructed to call 911 with any severe reactions post vaccine: Marland Kitchen Difficulty breathing  . Swelling of face and throat  . A fast heartbeat  . A bad rash all over body  . Dizziness and weakness   Immunizations Administered    Name Date Dose VIS Date Route   Pfizer COVID-19 Vaccine 07/08/2019  1:44 PM 0.3 mL 04/17/2019 Intramuscular   Manufacturer: Power   Lot: [id number]   [place]: [id number]

## 2019-07-10 ENCOUNTER — Encounter: Payer: Self-pay | Admitting: Registered"

## 2019-07-10 ENCOUNTER — Encounter: Payer: PPO | Attending: Adult Health | Admitting: Registered"

## 2019-07-10 DIAGNOSIS — R7303 Prediabetes: Secondary | ICD-10-CM | POA: Diagnosis not present

## 2019-07-10 NOTE — Progress Notes (Signed)
On 07/10/2019 patient completed Core Session 6 of Diabetes Prevention Program course virtually with [facility]. The following learning objectives were met by the patient during this class:   Learning Objectives:  Graph their daily physical activity.   Describe two ways of finding the time to be active.   Define "lifestyle activity."   Describe how to prevent injury.   Develop an activity plan for the coming week.   Goals:   Record weight taken outside of class.   Track foods and beverages eaten each day in the "Food and Activity Tracker," including calories and fat grams for each item.    Track activity type, minutes you were active, and distance you reached each day in the "Food and Activity Tracker."   Set aside one 20 to 30-minute block of time every day or find two or more periods of 10 to15 minutes each for physical activity.   Warm up, cool down, and stretch.  Make a Physical Activities Plan for the Week.   Follow-Up Plan:  Attend Core Session 7 next week.   E-mail completed "Food and Activity Tracker" to Lifestyle Coach next week before class  

## 2019-07-17 ENCOUNTER — Encounter (HOSPITAL_BASED_OUTPATIENT_CLINIC_OR_DEPARTMENT_OTHER): Payer: PPO | Admitting: Registered"

## 2019-07-17 DIAGNOSIS — R7303 Prediabetes: Secondary | ICD-10-CM

## 2019-07-20 ENCOUNTER — Telehealth: Payer: Self-pay | Admitting: Adult Health

## 2019-07-20 ENCOUNTER — Encounter: Payer: Self-pay | Admitting: Registered"

## 2019-07-20 NOTE — Progress Notes (Signed)
On 07/17/19 patient completed Core Session 7 of Diabetes Prevention Program course virtually with [facility]. The following learning objectives were met by the patient during this class:   Learning Objectives:  Define calorie balance.  Explain how healthy eating and being active are related in terms of calorie balance.   Describe the relationship between calorie balance and weight loss.   Describe his or her progress as it relates to calorie balance.   Develop an activity plan for the coming week.   Goals:   Record weight taken outside of class.   Track foods and beverages eaten each day in the "Food and Activity Tracker," including calories and fat grams for each item.    Track activity type, minutes you were active, and distance you reached each day in the "Food and Activity Tracker."   Set aside one 20 to 30-minute block of time every day or find two or more periods of 10 to15 minutes each for physical activity.   Make a Physical Activities Plan for the Week.   Make active lifestyle choices all through the day   Stay at or go slightly over activity goal.   Follow-Up Plan:  Attend Core Session 8 next week.   E-mail completed "Food and Activity Tracker" to Lifestyle Coach next week before class  

## 2019-07-20 NOTE — Progress Notes (Signed)
  Chronic Care Management   Note  07/20/2019 Name: ELMER FICHTEL MRN: [id number] DOB: 18-Nov-1955  NAJJA CHATEL is a 64 y.o. year old male who is a primary care patient of Dorothyann Peng, NP. I reached out to [name] by phone today in response to a referral sent by Mr. Zacheriah Shamah Lininger's PCP, Dorothyann Peng, NP.   Mr. Raza was given information about Chronic Care Management services today including:  1. CCM service includes personalized support from designated clinical staff supervised by his physician, including individualized plan of care and coordination with other care providers 2. 24/7 contact phone numbers for assistance for urgent and routine care needs. 3. Service will only be billed when office clinical staff spend 20 minutes or more in a month to coordinate care. 4. Only one practitioner may furnish and bill the service in a calendar month. 5. The patient may stop CCM services at any time (effective at the end of the month) by phone call to the office staff.   Patient did not agree to enrollment in care management services and does not wish to consider at this time.  Follow up plan:   Raynicia Dukes [employer] Scheduler

## 2019-07-24 ENCOUNTER — Encounter (HOSPITAL_BASED_OUTPATIENT_CLINIC_OR_DEPARTMENT_OTHER): Payer: PPO | Admitting: Registered"

## 2019-07-24 DIAGNOSIS — R7303 Prediabetes: Secondary | ICD-10-CM

## 2019-07-26 ENCOUNTER — Encounter: Payer: Self-pay | Admitting: Adult Health

## 2019-07-26 DIAGNOSIS — I10 Essential (primary) hypertension: Secondary | ICD-10-CM

## 2019-07-28 MED ORDER — LISINOPRIL 10 MG PO TABS: 10.0000 mg | ORAL_TABLET | Freq: Every day | ORAL | 1 refills | Status: DC

## 2019-07-28 MED ORDER — HYDROCHLOROTHIAZIDE 25 MG PO TABS: 25.0000 mg | ORAL_TABLET | Freq: Every day | ORAL | 1 refills | Status: DC

## 2019-07-29 ENCOUNTER — Encounter: Payer: Self-pay | Admitting: Registered"

## 2019-07-29 NOTE — Progress Notes (Signed)
On 07/24/19 patient completed Core Session 8 of Diabetes Prevention Program course virtually with [facility]. The following learning objectives were met by the patient during this class:   Learning Objectives:  Recognize positive and negative food and activity cues.   Change negative food and activity cues to positive cues.   Add positive cues for activity and eliminate cues for inactivity.   Develop a plan for removing one problem food cue for the coming week.   Goals:   Record weight taken outside of class.   Track foods and beverages eaten each day in the "Food and Activity Tracker," including calories and fat grams for each item.    Track activity type, minutes you were active, and distance you reached each day in the "Food and Activity Tracker."   Set aside one 20 to 30-minute block of time every day or find two or more periods of 10 to15 minutes each for physical activity.   Remove one problem food cue.   Add one positive cue for being more active.  Follow-Up Plan: . Attend Core Session 9 next week.  . Email completed "Food and Activity Tracker" next week to be reviewed by Lifestyle Coach.  

## 2019-07-31 ENCOUNTER — Encounter (HOSPITAL_BASED_OUTPATIENT_CLINIC_OR_DEPARTMENT_OTHER): Payer: PPO | Admitting: Registered"

## 2019-07-31 DIAGNOSIS — R7303 Prediabetes: Secondary | ICD-10-CM

## 2019-08-04 ENCOUNTER — Encounter: Payer: Self-pay | Admitting: Registered"

## 2019-08-04 NOTE — Progress Notes (Signed)
On 07/31/19 patient completed Core Session 9 of Diabetes Prevention Program course virtually with [facility]. The following learning objectives were met by the patient during this class:   Learning Objectives:  List and describe five steps to problem solving.   Apply the five problem solving steps to resolve a problem he or she has with eating less fat and fewer calories or being more active.   Goals:   Record weight taken outside of class.   Track foods and beverages eaten each day in the "Food and Activity Tracker," including calories and fat grams for each item.    Track activity type, minutes you were active, and distance you reached each day in the "Food and Activity Tracker."   Set aside one 20 to 30-minute block of time every day or find two or more periods of 10 to15 minutes each for physical activity.   Use problem solving action plan created during session to problem solve.   Follow-Up Plan:  Attend Core Session 10.   Email completed "Food and Activity Tracker" next week to be reviewed by Lifestyle Coach.  Email menus from favorite restaurants to next session for future discussion.   

## 2019-08-10 ENCOUNTER — Encounter: Payer: Self-pay | Admitting: Adult Health

## 2019-08-14 ENCOUNTER — Encounter: Payer: PPO | Attending: Adult Health | Admitting: Registered"

## 2019-08-14 ENCOUNTER — Encounter: Payer: Self-pay | Admitting: Registered"

## 2019-08-14 DIAGNOSIS — R7303 Prediabetes: Secondary | ICD-10-CM | POA: Diagnosis not present

## 2019-08-14 NOTE — Progress Notes (Signed)
On 08/14/19 patient completed Core Session 10 of Diabetes Prevention Program course virtually with [facility]. The following learning objectives were met by the patient during this class:   Learning Objectives:  List and describe the four keys for healthy eating out.   Give examples of how to apply these keys at the type of restaurants that the participants go to regularly.   Make an appropriate meal selection from a restaurant menu.   Demonstrate how to ask for a substitute item using assertive language and a polite tone of voice.    Goals:   Record weight taken outside of class.   Track foods and beverages eaten each day in the "Food and Activity Tracker," including calories and fat grams for each item.    Track activity type, minutes you were active, and distance you reached each day in the "Food and Activity Tracker."   Set aside one 20 to 30-minute block of time every day or find two or more periods of 10 to15 minutes each for physical activity.   Utilize positive action plan and complete questions on "To Do List."   Follow-Up Plan:  Attend Core Session 11.   Email completed "Food and Activity Tracker" to be reviewed by Lifestyle Coach.

## 2019-08-21 ENCOUNTER — Encounter (HOSPITAL_BASED_OUTPATIENT_CLINIC_OR_DEPARTMENT_OTHER): Payer: PPO | Admitting: Registered"

## 2019-08-21 ENCOUNTER — Encounter: Payer: Self-pay | Admitting: Registered"

## 2019-08-21 DIAGNOSIS — R7303 Prediabetes: Secondary | ICD-10-CM

## 2019-08-21 NOTE — Progress Notes (Signed)
On 08/21/19 pt completed Core Session 11 of Diabetes Prevention Program course virtually with [facility]. By the end of this session patients are able to complete the following objectives:   Learning Objectives:  Give examples of negative thoughts that could prevent them from meeting their goals of losing weight and being more physically active.   Describe how to stop negative thoughts and talk back to them with positive thoughts.   Practice 1) stopping negative thoughts and 2) talking back to negative thoughts with positive ones.    Goals:   Record weight taken outside of class.   Track foods and beverages eaten each day in the "Food and Activity Tracker," including calories and fat grams for each item.    Track activity type, minutes you were active, and distance you reached each day in the "Food and Activity Tracker."   If you have any negative thoughts-write them in your Food and Activity Trackers, along with how you talked back to them. Practice stopping negative thoughts and talking back to them with positive thoughts.   Follow-Up Plan:  Attend Core Session 12 next week.   Email completed "Food and Activity Tracker" before next week to be reviewed by Lifestyle Coach.

## 2019-08-28 ENCOUNTER — Telehealth: Payer: Self-pay | Admitting: Adult Health

## 2019-08-28 ENCOUNTER — Encounter: Payer: Self-pay | Admitting: Gastroenterology

## 2019-08-28 ENCOUNTER — Encounter (HOSPITAL_BASED_OUTPATIENT_CLINIC_OR_DEPARTMENT_OTHER): Payer: PPO | Admitting: Registered"

## 2019-08-28 DIAGNOSIS — R7303 Prediabetes: Secondary | ICD-10-CM | POA: Diagnosis not present

## 2019-08-28 DIAGNOSIS — Z1211 Encounter for screening for malignant neoplasm of colon: Secondary | ICD-10-CM

## 2019-08-28 NOTE — Telephone Encounter (Signed)
He had a cologuard in 2017 which would make him due for either a cologuard or colonscopy

## 2019-08-28 NOTE — Addendum Note (Signed)
Addended by: Miles Costain T on: 08/28/2019 01:44 PM   Modules accepted: Orders

## 2019-08-28 NOTE — Telephone Encounter (Signed)
Pt notified that an order for colonoscopy has been placed.  Nothing further needed.

## 2019-08-28 NOTE — Telephone Encounter (Signed)
Pt sent a my-chart message about scheduling a colonoscopy. I informed the pt that we do not do colonoscopies at this office and if needed the PCP can send a referral if he is due for one. Pt stated he received a VM about getting scheduled for one but the message cut out and asked that I message his PCP about this information.   Pt would like to be contacted at [phone]

## 2019-09-04 ENCOUNTER — Encounter: Payer: Self-pay | Admitting: Registered"

## 2019-09-04 ENCOUNTER — Encounter (HOSPITAL_BASED_OUTPATIENT_CLINIC_OR_DEPARTMENT_OTHER): Payer: PPO | Admitting: Registered"

## 2019-09-04 DIAGNOSIS — R7303 Prediabetes: Secondary | ICD-10-CM | POA: Diagnosis not present

## 2019-09-04 NOTE — Progress Notes (Signed)
On 08/28/19 patient completed Core Session 12 of Diabetes Prevention Program course virtually with [facility]. By the end of this session patients are able to complete the following objectives:   Learning Objectives:  Describe their current progress toward defined goals.  Describe common causes for slipping from healthy eating or being  active.  Explain what to do to get back on their feet after a slip.  Goals:   Record weight taken outside of class.   Track foods and beverages eaten each day in the "Food and Activity Tracker," including calories and fat grams for each item.    Track activity type, minutes active, and distance reached each day in the "Food and Activity Tracker."   Try out the two action plans created during session- "Slips from Healthy Eating: Action Plan" and "Slips from Being Active: Action Plan"  Answer questions on the handout.   Follow-Up Plan:  Attend Core Session 13 next week.   Email completed "Food and Activity Tracker" next week to be reviewed by Lifestyle Coach.

## 2019-09-05 NOTE — Progress Notes (Signed)
On 09/03/19 pt completed a Core Session 13 of Diabetes Prevention Program course virtually with  [facility]. By the end of this session patients are able to complete the following objectives:   Learning Objectives:  Describe ways to add interest and variety to their activity plans.  Define ?aerobic fitness.  Explain the four F.I.T.T. principles (frequency, intensity, time, and type of activity) and how they relate to aerobic fitness.   Goals:   Record weight taken outside of class.   Track foods and beverages eaten each day in the "Food and Activity Tracker," including calories and fat grams for each item.    Track activity type, minutes you were active, and distance you reached each day in the "Food and Activity Tracker."   Do your best to reach activity goal for the week.  Use one of the F.I.T.T. principles to jump start workouts.  Document activity level on the "To Do Next Week" handout.  Follow-Up Plan:  Attend Core Session 14 next week.   Email completed "Food and Activity Tracker" next week to be reviewed by Lifestyle Coach.

## 2019-09-07 ENCOUNTER — Other Ambulatory Visit: Payer: Self-pay

## 2019-09-07 ENCOUNTER — Ambulatory Visit: Payer: PPO | Admitting: Mental Health

## 2019-09-07 DIAGNOSIS — F330 Major depressive disorder, recurrent, mild: Secondary | ICD-10-CM

## 2019-09-07 NOTE — Progress Notes (Signed)
[facility] Counselor Initial Adult Exam  Name: James [name] Date: 09/07/2019 MRN: [id number] DOB: 11/25/1955 PCP: Dorothyann Peng, NP  Time spent: 53 minutes  Reason for Visit /Presenting Problem: He stated he has come to counseling due to his anger, which increased about a year ago. He had a hip replacement last October, needed it as he suffered for about 5 years. His wife was dx'd w/ a brain tumor 20 years ago, needing surgury. She is wheelchair bound, now exhibiting dementia sx's, TIA's occurring (over 74), ongoing incontinence. He has been her caregiver.  He stated has become more anger, yelling. She is in a power wheelchair. She often runs into things w/ her wheelchair trying to adjust. Executive functioning is impaired, cannot complete multiple steps in tasks, memory impairments, short term is intact most of the time however. He stopped working 11 years ago as she need full time care.  She often wants to leave the house more often, he avoids this due to how long this process can be. A caregiver is there 6 days/week for about 2 hours. He stated he falls in to "stress eating", he watches tv and eats "all day". Has to force himself to work in the yard, home projects. feelings of resentment, thinks "this is not worth it". Increasing arguments, but also pleasant conversations. They have 2 adult children, daughter lives in [place], [place], son lives in [place], [place].  Wants to improve motivation to accomplish tasks (working in the yard) Wants to improve health, loss weight. He is in healthy living pre-diabetes program that meets weekly for the past 12 weeks Wants to stop feeling angry so often    Mental Status Exam:   Appearance:   Casual     Behavior:  Appropriate  Motor:  Normal  Speech/Language:   Clear and Coherent  Affect:  depressed  Mood:  constricted  Thought process:  normal  Thought content:    WNL  Sensory/Perceptual disturbances:    WNL  Orientation:  x4  Attention:  Good   Concentration:  Good  Memory:  WNL  Fund of knowledge:   Good  Insight:    Good  Judgment:   Good  Impulse Control:  fair   Reported Symptoms:  Anger behaviors -yelling, depressed mood, low motivation, feelings of resentment, binge eating, anhedonia  Risk Assessment: Danger to Self:  denied Self-injurious Behavior: none Danger to Others: none Duty to Warn: none Physical Aggression / Violence: none Access to Firearms a concern: none Gang Involvement: none Patient / guardian was educated about steps to take if suicide or homicide risk level increases between visits: none While future psychiatric events cannot be accurately predicted, the patient does not currently require acute inpatient psychiatric care and does not currently meet [facility] involuntary commitment criteria.  Substance Abuse History: Current substance abuse: none   Past Psychiatric History:   Outpatient Providers: none History of Psych Hospitalization: none Psychological Testing: none  Medical History/Surgical History:  Past Medical History:  Diagnosis Date  . Arthritis   . Chicken pox   . Complication of anesthesia    difficulty waking up  . Hypertension   . Joint pain   . Obesity   . Osteoarthritis   . Prediabetes   . Swelling    feet and legs    Past Surgical History:  Procedure Laterality Date  . cartilage removal    . KNEE SURGERY  2000  . TOTAL HIP ARTHROPLASTY Left 02/11/2019   Procedure: TOTAL HIP [place];  Surgeon:  Gaynelle Arabian, MD;  Location: [facility];  Service: Orthopedics;  Laterality: Left;  128min    Medications: Current Outpatient Medications  Medication Sig Dispense Refill  . acetaminophen (TYLENOL) 500 MG tablet Take 1,000 mg by mouth 2 (two) times daily.    . ASPERCREME LIDOCAINE EX Apply 1 application topically 2 (two) times daily as needed (pain).    . hydrochlorothiazide (HYDRODIURIL) 25 MG tablet Take 1 tablet (25 mg total) by mouth daily. 90 tablet 1   . HYDROcodone-acetaminophen (NORCO/VICODIN) 5-325 MG tablet Take 1-2 tablets by mouth every 6 (six) hours as needed for severe pain. 56 tablet 0  . lisinopril (ZESTRIL) 10 MG tablet Take 1 tablet (10 mg total) by mouth daily. 90 tablet 1  . methocarbamol (ROBAXIN) 500 MG tablet Take 1 tablet (500 mg total) by mouth every 6 (six) hours as needed for muscle spasms. 40 tablet 0  . rivaroxaban (XARELTO) 10 MG TABS tablet Take 1 tablet (10 mg total) by mouth daily with breakfast for 19 days. Take one tablet Xarelto once a day for three weeks following surgery. Then change to one baby Aspirin (81 mg) once a day for three weeks. Then discontinue Aspirin. 19 tablet 0  . traMADol (ULTRAM) 50 MG tablet Take 1-2 tablets (50-100 mg total) by mouth every 6 (six) hours as needed for moderate pain. 40 tablet 0   No current facility-administered medications for this visit.    Allergies  Allergen Reactions  . Lipitor [Atorvastatin] Other (See Comments)    Lightheaded    Diagnoses:    ICD-10-CM   1. MDD (major depressive disorder), recurrent episode, mild ([place])  F33.0     Plan of Care: TBD   [place], [facility]

## 2019-09-11 ENCOUNTER — Encounter: Payer: PPO | Attending: Adult Health | Admitting: Registered"

## 2019-09-11 DIAGNOSIS — R7303 Prediabetes: Secondary | ICD-10-CM | POA: Diagnosis not present

## 2019-09-17 ENCOUNTER — Encounter: Payer: Self-pay | Admitting: Registered"

## 2019-09-17 NOTE — Progress Notes (Signed)
On 09/11/19 patient completed Core Session 14 of Diabetes Prevention Program course virtually with [facility]. By the end of this session patients are able to complete the following objectives:   Learning Objectives:  Give examples of problem social cues and helpful social cues.   Explain how to remove problem social cues and add helpful ones.   Describe ways of coping with vacations and social events such as parties, holidays, and visits from relatives and friends.   Create an action plan to change a problem social cue and add a helpful one.   Goals:   Record weight taken outside of class.   Track foods and beverages eaten each day in the "Food and Activity Tracker," including calories and fat grams for each item.    Track activity type, minutes you were active, and distance you reached each day in the "Food and Activity Tracker."   Do your best to reach activity goal for the week.  Use action plan created during session to change a problem social cue and add a helpful social cue.   Answer questions regarding success of changing social cues on "To Do Next Week" handout.   Follow-Up Plan:  Attend Core Session 15 next week.   Email completed "Food and Activity Tracker" next week to be reviewed by Lifestyle Coach.

## 2019-09-18 ENCOUNTER — Encounter: Payer: Self-pay | Admitting: Registered"

## 2019-09-18 ENCOUNTER — Encounter (HOSPITAL_BASED_OUTPATIENT_CLINIC_OR_DEPARTMENT_OTHER): Payer: PPO | Admitting: Registered"

## 2019-09-18 DIAGNOSIS — R7303 Prediabetes: Secondary | ICD-10-CM

## 2019-09-18 NOTE — Progress Notes (Signed)
On 09/18/19 pt completed Core Session 15 of Diabetes Prevention Program course virtually with [facility]. By the end of this session patients are able to complete the following objectives:   Learning Objectives:  Explain how to prevent stress or cope with unavoidable stress.   Describe how this program can be a source of stress.   Explain how to manage stressful situations.   Create and follow an action plan for either preventing or coping with a stressful situation.   Goals:   Record weight taken outside of class.   Track foods and beverages eaten each day in the "Food and Activity Tracker," including calories and fat grams for each item.    Track activity type, minutes you were active, and distance you reached each day in the "Food and Activity Tracker."   Do your best to reach activity goal for the week.  Follow your action plan to reduce stress.   Answer questions on handout regarding success of action plan.   Follow-Up Plan:  Attend Core Session 16 next week.   Email completed "Food and Activity Tracker" next week to be reviewed by Lifestyle Coach.

## 2019-09-25 ENCOUNTER — Encounter (HOSPITAL_BASED_OUTPATIENT_CLINIC_OR_DEPARTMENT_OTHER): Payer: PPO | Admitting: Registered"

## 2019-09-25 DIAGNOSIS — R7303 Prediabetes: Secondary | ICD-10-CM | POA: Diagnosis not present

## 2019-09-30 ENCOUNTER — Ambulatory Visit: Payer: PPO | Admitting: Mental Health

## 2019-10-01 ENCOUNTER — Encounter: Payer: Self-pay | Admitting: Gastroenterology

## 2019-10-01 ENCOUNTER — Encounter: Payer: Self-pay | Admitting: Registered"

## 2019-10-01 ENCOUNTER — Ambulatory Visit (INDEPENDENT_AMBULATORY_CARE_PROVIDER_SITE_OTHER): Payer: PPO | Admitting: Gastroenterology

## 2019-10-01 VITALS — BP 136/78 | HR 76 | Ht 66.0 in | Wt 325.8 lb

## 2019-10-01 DIAGNOSIS — Z1211 Encounter for screening for malignant neoplasm of colon: Secondary | ICD-10-CM

## 2019-10-01 NOTE — Patient Instructions (Signed)
If you are age 66 or older, your body mass index should be between 23-30. Your Body mass index is 52.59 kg/m. If this is out of the aforementioned range listed, please consider follow up with your Primary Care Provider.  If you are age 60 or younger, your body mass index should be between 19-25. Your Body mass index is 52.59 kg/m. If this is out of the aformentioned range listed, please consider follow up with your Primary Care Provider.   Your provider has ordered Cologuard testing as an option for colon cancer screening. This is performed by [employer] and may be out of network with your insurance. PRIOR to completing the test, it is YOUR responsibility to contact your insurance about covered benefits for this test. Your out of pocket expense could be anywhere from $0.00 to $649.00.   When you call to check coverage with your insurer, please provide the following information:   -The ONLY provider of Cologuard is [place] code for Cologuard is [phone].  [employer] NPI # [id number]  -[employer] Tax ID # [id number]   We have already sent your demographic and insurance information to [employer] (phone number [phone]) and they should contact you within the next week regarding your test. If you have not heard from them within the next week, please call our office at [phone].  It was a pleasure to see you today!  Dr. Loletha Carrow

## 2019-10-01 NOTE — Progress Notes (Signed)
On 09/25/19 pt completed Core Session 16 of Diabetes Prevention Program course virtually with [facility]. By the end of this session patients are able to complete the following objectives:   Learning Objectives:  Measure their progress toward weight and physical activity goals since Session 1.   Develop a plan for improving progress, if their goals have not yet been attained.   Describe ways to stay motivated long-term.   Goals:   Record weight taken outside of class.   Track foods and beverages eaten each day in the "Food and Activity Tracker," including calories and fat grams for each item.    Track activity type, minutes you were active, and distance you reached each day in the "Food and Activity Tracker."   Utilize action plan to help stay motivated and complete questions on "To Do List."   Follow-Up Plan:  Attend session 17 in two weeks.   Email completed "Food and Activity Tracker" before next session to be reviewed by Lifestyle Coach.

## 2019-10-01 NOTE — Progress Notes (Signed)
[facility] Consult Note:  History: James [name] 10/01/2019  Referring provider: Dorothyann Peng, NP  Reason for consult/chief complaint: Colon Cancer Screening (Patient has no compliants, he has never had a colonoscopy, has had Cologuard)   Subjective  HPI:  This is a very pleasant 64 year old man referred by primary care to discuss colorectal cancer screening.  In 2017 he had a Cologuard test which was negative.  He preferred to have that test because he cares for his disabled wife, and felt that the logistics of colonoscopy preparation and transportation would be difficult for him.  Beauden feels well, without chronic abdominal pain, altered bowel habits or rectal bleeding.  He denies heartburn, dysphagia, odynophagia, nausea, vomiting, early satiety or weight loss.  He wanted to discuss options for colon cancer screening.   ROS:  Review of Systems  Constitutional: Negative for appetite change and unexpected weight change.  HENT: Negative for mouth sores and voice change.   Eyes: Negative for pain and redness.  Respiratory: Negative for cough and shortness of breath.   Cardiovascular: Negative for chest pain and palpitations.  Genitourinary: Negative for dysuria and hematuria.  Musculoskeletal: Negative for arthralgias and myalgias.  Skin: Negative for pallor and rash.  Neurological: Negative for weakness and headaches.  Hematological: Negative for adenopathy.     Past Medical History: Past Medical History:  Diagnosis Date  . Arthritis   . Chicken pox   . Complication of anesthesia    difficulty waking up  . Hypertension   . Joint pain   . Obesity   . Osteoarthritis   . Prediabetes   . Swelling    feet and legs     Past Surgical History: Past Surgical History:  Procedure Laterality Date  . cartilage removal    . KNEE SURGERY  2000  . TOTAL HIP ARTHROPLASTY Left 02/11/2019   Procedure: TOTAL HIP ARTHROPLASTY-POSTERIOR;  Surgeon:  Gaynelle Arabian, MD;  Location: [facility];  Service: Orthopedics;  Laterality: Left;  150min     Family History: Family History  Problem Relation Age of Onset  . Cancer Mother        lung and soft tissue cancer from radiation  . Stroke Mother   . Obesity Mother   . Heart disease Father        CHF  . Diabetes Father   . Hypertension Father   . Obesity Father   . Heart disease Maternal Grandfather   . Diabetes Paternal Grandmother   . Ovarian cancer Sister    No known colon cancer in the family  Social History: Social History   Socioeconomic History  . Marital status: Married    Spouse name: Not on file  . Number of children: Not on file  . Years of education: Not on file  . Highest education level: Not on file  Occupational History  . Occupation: At home caregiver  Tobacco Use  . Smoking status: Former Smoker    Quit date: 07/16/1992    Years since quitting: 27.2  . Smokeless tobacco: Never Used  Substance and Sexual Activity  . Alcohol use: Yes    Comment: very little per patient   . Drug use: No  . Sexual activity: Not on file  Other Topics Concern  . Not on file  Social History Narrative   Work or School: self employed, 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      Home Situation: living with and caring for wife whom had stroke and brain tumor  Spiritual Beliefs: [employer]      Lifestyle: no regular exercise, diet poor            Social Determinants of Health   Financial Resource Strain:   . Difficulty of Paying Living Expenses:   Food Insecurity:   . Worried About Charity fundraiser in the Last Year:   . Arboriculturist in the Last Year:   Transportation Needs:   . Film/video editor (Medical):   Marland Kitchen Lack of Transportation (Non-Medical):   Physical Activity:   . Days of Exercise per Week:   . Minutes of Exercise per Session:   Stress:   . Feeling of Stress :   Social Connections:   . Frequency of Communication with Friends and Family:   . Frequency  of Social Gatherings with Friends and Family:   . Attends Religious Services:   . Active Member of Clubs or Organizations:   . Attends Archivist Meetings:   Marland Kitchen Marital Status:     Allergies: Allergies  Allergen Reactions  . Lipitor [Atorvastatin] Other (See Comments)    Lightheaded    Outpatient Meds: Current Outpatient Medications  Medication Sig Dispense Refill  . acetaminophen (TYLENOL) 500 MG tablet Take 1,000 mg by mouth 2 (two) times daily.    . hydrochlorothiazide (HYDRODIURIL) 25 MG tablet Take 1 tablet (25 mg total) by mouth daily. 90 tablet 1  . lisinopril (ZESTRIL) 10 MG tablet Take 1 tablet (10 mg total) by mouth daily. 90 tablet 1   No current facility-administered medications for this visit.      ___________________________________________________________________ Objective   Exam:  BP 136/78   Pulse 76   Ht 5\' 6"  (1.676 m)   Wt (!) 325 lb 12.8 oz (147.8 kg)   BMI 52.59 kg/m    General: Well-appearing, pleasant and conversational.  Gets on exam table without assistance.  Eyes: sclera anicteric, no redness  ENT: oral mucosa moist without lesions, no cervical or supraclavicular lymphadenopathy  CV: RRR without murmur, S1/S2, no JVD, mild bilateral leg edema with venous stasis changes.  Resp: clear to auscultation bilaterally, normal RR and effort noted  GI: soft, no tenderness, with active bowel sounds.  Limited exam due to body habitus/morbid obesity  Skin; warm and dry, no rash or jaundice noted  Neuro: awake, alert and oriented x 3. Normal gross motor function and fluent speech  Labs:  Negative cologuard May 2017  CBC Latest Ref Rng & Units 02/13/2019 02/12/2019 02/04/2019  WBC 4.0 - 10.5 K/uL 10.5 7.9 6.8  Hemoglobin 13.0 - 17.0 g/dL 12.2(L) 12.3(L) 14.5  Hematocrit 39.0 - 52.0 % 37.7(L) 37.7(L) 44.8  Platelets 150 - 400 K/uL 172 149(L) 193   CMP Latest Ref Rng & Units 02/13/2019 02/12/2019 02/04/2019  Glucose 70 - 99 mg/dL 145(H)  155(H) 117(H)  BUN 8 - 23 mg/dL 15 13 21   Creatinine 0.61 - 1.24 mg/dL 0.68 0.70 0.89  Sodium 135 - 145 mmol/L 135 135 137  Potassium 3.5 - 5.1 mmol/L 3.9 3.8 4.0  Chloride 98 - 111 mmol/L 98 99 99  CO2 22 - 32 mmol/L 26 24 29   Calcium 8.9 - 10.3 mg/dL 8.4(L) 8.1(L) 9.5  Total Protein 6.5 - 8.1 g/dL - - 7.6  Total Bilirubin 0.3 - 1.2 mg/dL - - 0.8  Alkaline Phos 38 - 126 U/L - - 49  AST 15 - 41 U/L - - 20  ALT 0 - 44 U/L - - 24    Assessment:  Encounter Diagnosis  Name Primary?  . Special screening for malignant neoplasms, colon Yes    We discussed the screening options of FOBT, Cologuard and colonoscopy.  I do not recommend FOBT due to its lack of sensitivity and specificity.  Cologuard is certainly better than that in its ability to pick up DNA fragments of some precancerous polyps.  It still has about a 15% false negative rate.  It has the benefit of being noninvasive.  If positive, and requires a diagnostic colonoscopy (which is also covered differently by Medicare, something which I made him aware of).  Colonoscopy is the most sensitive and specific test for colorectal cancer screening and removal of polyps if necessary, but of course is invasive and requires a preparation, sedation and those logistics.  His procedure would also need to be done in the hospital outpatient endoscopy lab due to his elevated BMI.  So his risk of sedation is somewhat higher than average as a result of his BMI.  Plan:  After considering all that, he elected to have a Cologuard.  He understands that if it is positive, I will recommend a colonoscopy, at which point he would be agreeable to proceeding with that diagnostic exam. We will contact him with the results.  (Total time 30 minutes inclusive of chart review, patient evaluation and discussion, documentation)  Thank you for the courtesy of this consult.  Please call me with any questions or concerns.  Nelida Meuse III  CC: Referring provider  noted above

## 2019-10-02 ENCOUNTER — Encounter: Payer: PPO | Admitting: Registered"

## 2019-10-07 ENCOUNTER — Ambulatory Visit: Payer: PPO | Admitting: Psychiatry

## 2019-10-07 ENCOUNTER — Other Ambulatory Visit: Payer: Self-pay

## 2019-10-07 DIAGNOSIS — Z5320 Procedure and treatment not carried out because of patient's decision for unspecified reasons: Secondary | ICD-10-CM

## 2019-10-07 NOTE — Progress Notes (Signed)
Admin note for non-service contact  Patient ID: KHAYREE CALLIHAM  MRN: [id number] DATE: 10/07/2019  PT scheduled for 9am 1st encounter, reportedly after having been seen once by Lanetta Inch, [facility] of this office.  As represented to me a couple weeks ago, Pt needed to switch therapists due to insurance, accepted.    On emerging from earlier session at 10 after, I heard PT loudly engaging the office manager in the hall about his inconveniences and demanding full refund, and walked up to O.M. trying to offer to PT that his doctor is right now available and beside him.  PT declined, adamant that he has little time and a disabled wife to attend to and will only accept a full refund (presumably the initial deposit from before the session with Mr. Rosana Berger).  Inferred that that first encounter became  infuriating to PT after the session for finding out that he was set up with a non-covered therapist, that there must have been some altercation and/or demand made at the time, and that that evaluation had been comped already.  Clearly not enough to establish new rapport at this time.  Agreed with O.M. to allow PT to choose exit, with refund to come and no further obligation, and proceed elsewhere at his own discretion.  Blanchie Serve, PhD Luan Moore, PhD LP Clinical Psychologist, [facility] [address], [place], [place] [address] [phone]

## 2019-10-14 ENCOUNTER — Ambulatory Visit: Payer: PPO | Admitting: Mental Health

## 2019-10-14 DIAGNOSIS — Z1212 Encounter for screening for malignant neoplasm of rectum: Secondary | ICD-10-CM | POA: Diagnosis not present

## 2019-10-14 DIAGNOSIS — Z1211 Encounter for screening for malignant neoplasm of colon: Secondary | ICD-10-CM | POA: Diagnosis not present

## 2019-10-15 ENCOUNTER — Other Ambulatory Visit: Payer: Self-pay

## 2019-10-15 ENCOUNTER — Ambulatory Visit (INDEPENDENT_AMBULATORY_CARE_PROVIDER_SITE_OTHER): Payer: PPO | Admitting: Family Medicine

## 2019-10-15 ENCOUNTER — Ambulatory Visit (INDEPENDENT_AMBULATORY_CARE_PROVIDER_SITE_OTHER)
Admission: RE | Admit: 2019-10-15 | Discharge: 2019-10-15 | Disposition: A | Discharge status: Home / Self Care | Payer: PPO | Source: Ambulatory Visit | Attending: Family Medicine | Admitting: Family Medicine

## 2019-10-15 ENCOUNTER — Encounter: Payer: Self-pay | Admitting: Family Medicine

## 2019-10-15 ENCOUNTER — Other Ambulatory Visit (INDEPENDENT_AMBULATORY_CARE_PROVIDER_SITE_OTHER): Payer: PPO

## 2019-10-15 VITALS — BP 150/70 | HR 85 | Temp 97.9°F | Wt 326.8 lb

## 2019-10-15 DIAGNOSIS — I10 Essential (primary) hypertension: Secondary | ICD-10-CM

## 2019-10-15 DIAGNOSIS — R7303 Prediabetes: Secondary | ICD-10-CM | POA: Diagnosis not present

## 2019-10-15 DIAGNOSIS — R0602 Shortness of breath: Secondary | ICD-10-CM

## 2019-10-15 DIAGNOSIS — J984 Other disorders of lung: Secondary | ICD-10-CM | POA: Diagnosis not present

## 2019-10-15 LAB — BASIC METABOLIC PANEL
BUN: 17 mg/dL (ref 6–23)
CO2: 27 mEq/L (ref 19–32)
Calcium: 9.4 mg/dL (ref 8.4–10.5)
Chloride: 99 mEq/L (ref 96–112)
Creatinine, Ser: 0.93 mg/dL (ref 0.40–1.50)
GFR: 81.33 mL/min (ref >60.00)
Glucose, Bld: 98 mg/dL (ref 70–99)
Potassium: 4.1 mEq/L (ref 3.5–5.1)
Sodium: 135 mEq/L (ref 135–145)

## 2019-10-15 LAB — CBC WITH DIFFERENTIAL/PLATELET
Basophils Absolute: 0 10*3/uL (ref 0.0–0.1)
Basophils Relative: 0.4 % (ref 0.0–3.0)
Eosinophils Absolute: 0.1 10*3/uL (ref 0.0–0.7)
Eosinophils Relative: 0.7 % (ref 0.0–5.0)
HCT: 43.9 % (ref 39.0–52.0)
Hemoglobin: 14.7 g/dL (ref 13.0–17.0)
Lymphocytes Relative: 26.7 % (ref 12.0–46.0)
Lymphs Abs: 2 10*3/uL (ref 0.7–4.0)
MCHC: 33.4 g/dL (ref 30.0–36.0)
MCV: 88.8 fl (ref 78.0–100.0)
Monocytes Absolute: 0.5 10*3/uL (ref 0.1–1.0)
Monocytes Relative: 7.4 % (ref 3.0–12.0)
Neutro Abs: 4.8 10*3/uL (ref 1.4–7.7)
Neutrophils Relative %: 64.8 % (ref 43.0–77.0)
Platelets: 201 10*3/uL (ref 150.0–400.0)
RBC: 4.95 Mil/uL (ref 4.22–5.81)
RDW: 15.1 % (ref 11.5–15.5)
WBC: 7.3 10*3/uL (ref 4.0–10.5)

## 2019-10-15 LAB — TSH: TSH: 1.93 u[IU]/mL (ref 0.35–4.50)

## 2019-10-15 LAB — HEPATIC FUNCTION PANEL
ALT: 24 U/L (ref 0–53)
AST: 20 U/L (ref 0–37)
Albumin: 4.4 g/dL (ref 3.5–5.2)
Alkaline Phosphatase: 62 U/L (ref 39–117)
Bilirubin, Direct: 0.1 mg/dL (ref 0.0–0.3)
Total Bilirubin: 0.5 mg/dL (ref 0.2–1.2)
Total Protein: 7.9 g/dL (ref 6.0–8.3)

## 2019-10-15 MED ORDER — ALBUTEROL SULFATE HFA 108 (90 BASE) MCG/ACT IN AERS: 2.0000 | INHALATION_SPRAY | RESPIRATORY_TRACT | 0 refills | Status: DC | PRN

## 2019-10-15 NOTE — Progress Notes (Signed)
   Subjective:    Patient ID: James [name], male    DOB: [birth year], 64 y.o.   MRN: [id number]  HPI Here for 3 months of slowly worsening SOB on exertion with chest tightness (but not pain) and wheezing with a dry cough. He is morbidly obese, and he has gained 28 lbs since he had a knee replacement last October. He has seasonal allergies with itchy eyes, PND, and stuufy nose and he uses Flonase and Zyrtec sporadically. He has never been diagnosed with asthma, but he has a brother and a sister with asthma. He is a former smoker, having quit 30 years ago. He has HTN and prediabetes.    Review of Systems  Constitutional: Positive for fatigue.  HENT: Positive for postnasal drip and sneezing.   Eyes: Positive for itching. Negative for discharge and redness.  Respiratory: Positive for cough, shortness of breath and wheezing.   Cardiovascular: Positive for leg swelling. Negative for chest pain and palpitations.  Gastrointestinal: Negative.   Genitourinary: Negative.        Objective:   Physical Exam Constitutional:      General: He is not in acute distress.    Comments: Morbidly obese. He walks with a limp.   Cardiovascular:     Rate and Rhythm: Normal rate and regular rhythm.     Pulses: Normal pulses.     Heart sounds: Normal heart sounds.  Pulmonary:     Effort: Pulmonary effort is normal. No respiratory distress.     Breath sounds: Normal breath sounds. No stridor. No wheezing, rhonchi or rales.  Musculoskeletal:     Comments: 3+ edema in both lower legs   Neurological:     General: No focal deficit present.     Mental Status: He is alert and oriented to person, place, and time.           Assessment & Plan:  This is a patient with HTN, prediabetes, and morbid obesity with 3 months of SOB in exertion. We need to evaluate for cardiac disease, although he could developing some asthma. I suggested he take Zyrtec and Flonase every day, and he will try an albuterol inhaler as  needed. Get labs and a CXR today. We will also set up a myocardial perfusion stress test soon.  Alysia Penna, MD

## 2019-10-16 ENCOUNTER — Encounter: Payer: PPO | Attending: Adult Health | Admitting: Registered"

## 2019-10-16 ENCOUNTER — Telehealth: Payer: Self-pay | Admitting: Family Medicine

## 2019-10-16 DIAGNOSIS — R7303 Prediabetes: Secondary | ICD-10-CM

## 2019-10-16 LAB — HEMOGLOBIN A1C: Hgb A1c MFr Bld: 5.8 % (ref 4.6–6.5)

## 2019-10-16 NOTE — Telephone Encounter (Signed)
Pt would like a call about results after doctor has reviewed them  [phone]  Pt saw Sarajane Jews

## 2019-10-16 NOTE — Telephone Encounter (Signed)
Noted. The patient will be contacted once we receive these results.

## 2019-10-20 ENCOUNTER — Encounter: Payer: Self-pay | Admitting: Family Medicine

## 2019-10-20 NOTE — Telephone Encounter (Signed)
Sent to [employer] for assistance.

## 2019-10-22 ENCOUNTER — Encounter: Payer: Self-pay | Admitting: Registered"

## 2019-10-22 NOTE — Progress Notes (Signed)
On 10/16/19 pt completed a session of the Diabetes Prevention Program course at [facility]. By the end of this session patients are able to complete the following objectives:   Learning Objectives:  Explain how glucose is used in the body and it's relationship with insulin/insulin resistance.   Identify symptoms of diabetes.   Describe lab tests used to diagnose diabetes.   Describe health complications and conditions related to diabetes.   Goals:   Record weight taken outside of class.   Track foods and beverages eaten each day in the "Food and Activity Tracker," including calories and fat grams for each item.    Track activity type, minutes you were active, and distance you reached each day in the "Food and Activity Tracker."   Follow-Up Plan:  Attend session 19 in two weeks.   Email completed "Food and Activity Trackers" before next session to be reviewed by Lifestyle Coach.

## 2019-10-23 ENCOUNTER — Other Ambulatory Visit: Payer: Self-pay

## 2019-10-23 LAB — COLOGUARD: Cologuard: POSITIVE — AB

## 2019-10-26 NOTE — Telephone Encounter (Signed)
FYI

## 2019-10-27 ENCOUNTER — Other Ambulatory Visit: Payer: Self-pay

## 2019-10-27 ENCOUNTER — Ambulatory Visit (INDEPENDENT_AMBULATORY_CARE_PROVIDER_SITE_OTHER): Payer: PPO | Admitting: Family Medicine

## 2019-10-27 ENCOUNTER — Encounter: Payer: Self-pay | Admitting: Family Medicine

## 2019-10-27 VITALS — BP 150/76 | HR 76 | Temp 97.6°F | Wt 329.8 lb

## 2019-10-27 DIAGNOSIS — R600 Localized edema: Secondary | ICD-10-CM | POA: Diagnosis not present

## 2019-10-27 MED ORDER — FUROSEMIDE 20 MG PO TABS: 20.0000 mg | ORAL_TABLET | Freq: Every day | ORAL | 0 refills | Status: DC

## 2019-10-27 NOTE — Progress Notes (Signed)
   Subjective:    Patient ID: James [name], male    DOB: 1955/11/16, 64 y.o.   MRN: [id number]  HPI Here to check swelling in the legs. He saw [name] recently for SOB, and we set up a Myoview perfusion study which he will have on 11-02-19. The SOB has actually improved but the swelling in the legs has worsened a bit. His recent labs were unremarkable and his renal function is normal.    Review of Systems  Constitutional: Negative.   Respiratory: Negative.   Cardiovascular: Positive for leg swelling. Negative for chest pain and palpitations.       Objective:   Physical Exam Constitutional:      Appearance: He is obese. He is not ill-appearing.  Cardiovascular:     Rate and Rhythm: Normal rate and regular rhythm.     Pulses: Normal pulses.     Heart sounds: Normal heart sounds.  Pulmonary:     Effort: Pulmonary effort is normal.     Breath sounds: Normal breath sounds.  Musculoskeletal:     Comments: 2+ edema in both legs   Neurological:     Mental Status: He is alert.           Assessment & Plan:  Leg edema, he will try Lasix 20 mg daily. We await the stress test results.  Alysia Penna, MD

## 2019-10-28 ENCOUNTER — Other Ambulatory Visit: Payer: Self-pay

## 2019-10-28 ENCOUNTER — Encounter (HOSPITAL_COMMUNITY): Payer: Self-pay | Admitting: *Deleted

## 2019-10-28 ENCOUNTER — Telehealth (HOSPITAL_COMMUNITY): Payer: Self-pay | Admitting: *Deleted

## 2019-10-28 DIAGNOSIS — Z1211 Encounter for screening for malignant neoplasm of colon: Secondary | ICD-10-CM

## 2019-10-28 NOTE — Telephone Encounter (Signed)
Patient given detailed instructions per Myocardial Perfusion Study Information Sheet for the test on 11/02/2019 at 0815. Patient notified to arrive 15 minutes early and that it is imperative to arrive on time for appointment to keep from having the test rescheduled.  If you need to cancel or reschedule your appointment, please call the office within 24 hours of your appointment. . Patient verbalized understanding.[facility] Mychart letter sent with instructions

## 2019-10-30 ENCOUNTER — Encounter: Payer: Self-pay | Admitting: Registered"

## 2019-10-30 ENCOUNTER — Encounter (HOSPITAL_BASED_OUTPATIENT_CLINIC_OR_DEPARTMENT_OTHER): Payer: PPO | Admitting: Registered"

## 2019-10-30 DIAGNOSIS — R7303 Prediabetes: Secondary | ICD-10-CM

## 2019-10-30 NOTE — Progress Notes (Signed)
On 10/30/19 pt completed a post core session of the Diabetes Prevention Program course virtually with [facility]. By the end of this session patients are able to complete the following objectives:   Learning Objectives:  Describe the differences between unsaturated, saturated, and trans fat on heart health.   List dietary sources of unsaturated, saturated, and trans fats.  Explain ways to reduce intake of saturated fat and replace them with heart healthy fats.  Goals:   Record weight taken outside of class.   Track foods and beverages eaten each day in the "Food and Activity Tracker," including calories and fat grams for each item.    Track activity type, minutes you were active, and distance you reached each day in the "Food and Activity Tracker."   Follow-Up Plan:  Attend next session.   Email completed "Food and Activity Trackers" before next session to be reviewed by Lifestyle Coach.

## 2019-11-02 ENCOUNTER — Other Ambulatory Visit: Payer: Self-pay

## 2019-11-02 ENCOUNTER — Ambulatory Visit (HOSPITAL_COMMUNITY): Payer: PPO | Attending: Cardiovascular Disease

## 2019-11-02 DIAGNOSIS — R0602 Shortness of breath: Secondary | ICD-10-CM | POA: Insufficient documentation

## 2019-11-02 MED ORDER — REGADENOSON 0.4 MG/5ML IV SOLN
0.4000 mg | Freq: Once | INTRAVENOUS | Status: AC
  Administered 2019-11-02: 0.4 mg via INTRAVENOUS

## 2019-11-02 MED ORDER — TECHNETIUM TC 99M TETROFOSMIN IV KIT
30.4000 | PACK | Freq: Once | INTRAVENOUS | Status: AC | PRN
  Administered 2019-11-02: 30.4 via INTRAVENOUS
  Filled 2019-11-02: qty 31

## 2019-11-03 ENCOUNTER — Ambulatory Visit (HOSPITAL_COMMUNITY): Payer: PPO | Attending: Cardiovascular Disease

## 2019-11-03 LAB — MYOCARDIAL PERFUSION IMAGING
LV dias vol: 76 mL (ref 62–150)
LV sys vol: 22 mL
Peak HR: 86 {beats}/min
Rest HR: 68 {beats}/min
SDS: 2
SRS: 0
SSS: 2
TID: 0.94

## 2019-11-03 MED ORDER — TECHNETIUM TC 99M TETROFOSMIN IV KIT
31.6000 | PACK | Freq: Once | INTRAVENOUS | Status: AC | PRN
  Administered 2019-11-03: 31.6 via INTRAVENOUS
  Filled 2019-11-03: qty 32

## 2019-11-04 ENCOUNTER — Encounter: Payer: Self-pay | Admitting: Adult Health

## 2019-11-13 ENCOUNTER — Encounter: Payer: Self-pay | Admitting: Registered"

## 2019-11-13 ENCOUNTER — Encounter: Payer: PPO | Attending: Adult Health | Admitting: Registered"

## 2019-11-13 DIAGNOSIS — R7303 Prediabetes: Secondary | ICD-10-CM | POA: Insufficient documentation

## 2019-11-13 NOTE — Progress Notes (Signed)
On 11/13/19 pt completed a post core session of the Diabetes Prevention Program course virtually with [facility]. By the end of this session patients are able to complete the following objectives:   Learning Objectives:  Describe the importance of having regular meals each day and how skipping meals can negatively affect food choices and weight.   Plan out balanced meals and snacks.  List ways to avoid unplanned snacking.   Goals:   Record weight taken outside of class.   Track foods and beverages eaten each day in the "Food and Activity Tracker," including calories and fat grams for each item.    Track activity type, minutes you were active, and distance you reached each day in the "Food and Activity Tracker."   Follow-Up Plan:  Attend next session.   Email completed "Food and Activity Trackers" before next session to be reviewed by Lifestyle Coach.

## 2019-11-17 ENCOUNTER — Telehealth: Payer: Self-pay | Admitting: Family Medicine

## 2019-11-17 NOTE — Telephone Encounter (Signed)
Did the swelling resolve with the 20 mg lasix?

## 2019-11-17 NOTE — Telephone Encounter (Signed)
Spoke to [employer].  He said the Lasix has helped some but thinks he may need a higher dosage of Lasix.  Advised that he will need a follow up visit with Tommi Rumps to discuss.  He asked for a virtual as it will be easier on him.  He states he will be able to show the swelling on the camera.  Pt is scheduled for tomorrow.

## 2019-11-18 ENCOUNTER — Telehealth (INDEPENDENT_AMBULATORY_CARE_PROVIDER_SITE_OTHER): Payer: PPO | Admitting: Adult Health

## 2019-11-18 ENCOUNTER — Encounter: Payer: Self-pay | Admitting: Adult Health

## 2019-11-18 VITALS — BP 130/84 | Temp 98.7°F | Wt 322.0 lb

## 2019-11-18 DIAGNOSIS — I10 Essential (primary) hypertension: Secondary | ICD-10-CM | POA: Diagnosis not present

## 2019-11-18 DIAGNOSIS — R600 Localized edema: Secondary | ICD-10-CM | POA: Diagnosis not present

## 2019-11-18 MED ORDER — FUROSEMIDE 20 MG PO TABS: 20.0000 mg | ORAL_TABLET | Freq: Every day | ORAL | 0 refills | Status: DC

## 2019-11-18 NOTE — Progress Notes (Signed)
Virtual Visit via Video Note  I connected with James [name]  on 11/18/19 at  3:30 PM EDT by a video enabled telemedicine application and verified that I am speaking with the correct person using two identifiers.  Location patient: home Location provider:work or home office Persons participating in the virtual visit: patient, provider  I discussed the limitations of evaluation and management by telemedicine and the availability of in person appointments. The patient expressed understanding and agreed to proceed.   HPI: 64 year old male who was seen on 10/27/2019 for bilateral lower extremity edema.  Prior to that he was seen for shortness of breath and had a Myoview perfusion study scheduled.  His stress test him back negative.  He was placed on Lasix 20 mg daily for the lower extremity edema and noticed significant improvement since taking this medication.  He reports that he is lost about 7 to 8 pounds of water weight, his legs are smaller, and he has much less edema.  He reports that he is at least 50% improved than prior to starting Lasix.  He is monitoring his blood pressure at home and reports readings in the 135-145's over 80s.  He is wondering if he needs to increase his Lasix dose   ROS: See pertinent positives and negatives per HPI.  Past Medical History:  Diagnosis Date  . Arthritis   . Chicken pox   . Complication of anesthesia    difficulty waking up  . Hypertension   . Joint pain   . Obesity   . Osteoarthritis   . Prediabetes   . Swelling    feet and legs    Past Surgical History:  Procedure Laterality Date  . cartilage removal    . KNEE SURGERY  2000  . TOTAL HIP ARTHROPLASTY Left 02/11/2019   Procedure: TOTAL HIP ARTHROPLASTY-POSTERIOR;  Surgeon: Gaynelle Arabian, MD;  Location: [facility];  Service: Orthopedics;  Laterality: Left;  117min    Family History  Problem Relation Age of Onset  . Cancer Mother        lung and soft tissue cancer from radiation  .  Stroke Mother   . Obesity Mother   . Heart disease Father        CHF  . Diabetes Father   . Hypertension Father   . Obesity Father   . Heart disease Maternal Grandfather   . Diabetes Paternal Grandmother   . Ovarian cancer Sister        Current Outpatient Medications:  .  acetaminophen (TYLENOL) 325 MG tablet, Take 650 mg by mouth at bedtime as needed., Disp: , Rfl:  .  albuterol (VENTOLIN HFA) 108 (90 Base) MCG/ACT inhaler, Inhale 2 puffs into the lungs every 4 (four) hours as needed for wheezing or shortness of breath., Disp: 18 g, Rfl: 0 .  cetirizine (ZYRTEC) 10 MG tablet, Take 10 mg by mouth daily., Disp: , Rfl:  .  cyclobenzaprine (FLEXERIL) 10 MG tablet, cyclobenzaprine 10 mg tablet  Take 10 mg by oral route as needed., Disp: , Rfl:  .  fluticasone (FLONASE) 50 MCG/ACT nasal spray, Place 2 sprays into both nostrils daily., Disp: , Rfl:  .  furosemide (LASIX) 20 MG tablet, Take 1 tablet (20 mg total) by mouth daily., Disp: 30 tablet, Rfl: 0 .  hydrochlorothiazide (HYDRODIURIL) 25 MG tablet, Take 1 tablet (25 mg total) by mouth daily., Disp: 90 tablet, Rfl: 1 .  ibuprofen (ADVIL) 200 MG tablet, Take 400 mg by mouth at bedtime as needed., Disp: ,  Rfl:  .  lisinopril (ZESTRIL) 10 MG tablet, Take 1 tablet (10 mg total) by mouth daily., Disp: 90 tablet, Rfl: 1 .  Multiple Vitamin (MULTIVITAMIN) tablet, Take 1 tablet by mouth daily., Disp: , Rfl:   EXAM:  VITALS per patient if applicable:  GENERAL: alert, oriented, appears well and in no acute distress  HEENT: atraumatic, conjunttiva clear, no obvious abnormalities on inspection of external nose and ears  NECK: normal movements of the head and neck  LUNGS: on inspection no signs of respiratory distress, breathing rate appears normal, no obvious gross SOB, gasping or wheezing  CV: no obvious cyanosis.  Does appear to continue to have pitting edema but very mild.  MS: moves all visible extremities without noticeable abnormality   PSYCH/NEURO: pleasant and cooperative, no obvious depression or anxiety, speech and thought processing grossly intact  ASSESSMENT AND PLAN:  Discussed the following assessment and plan: 1. Bilateral leg edema -Keep him on Lasix 20 mg for the time being. - furosemide (LASIX) 20 MG tablet; Take 1 tablet (20 mg total) by mouth daily.  Dispense: 90 tablet; Refill: 0  2. Essential hypertension -We will have him increase lisinopril from 10 mg to 20 mg daily.  He does not need a new prescription for this just yet      I discussed the assessment and treatment plan with the patient. The patient was provided an opportunity to ask questions and all were answered. The patient agreed with the plan and demonstrated an understanding of the instructions.   The patient was advised to call back or seek an in-person evaluation if the symptoms worsen or if the condition fails to improve as anticipated.   Dorothyann Peng, NP

## 2019-11-19 ENCOUNTER — Other Ambulatory Visit: Payer: Self-pay | Admitting: Family Medicine

## 2019-11-19 DIAGNOSIS — R600 Localized edema: Secondary | ICD-10-CM

## 2019-12-01 ENCOUNTER — Ambulatory Visit (AMBULATORY_SURGERY_CENTER): Payer: Self-pay | Admitting: *Deleted

## 2019-12-01 ENCOUNTER — Other Ambulatory Visit: Payer: Self-pay

## 2019-12-01 ENCOUNTER — Encounter: Payer: Self-pay | Admitting: Gastroenterology

## 2019-12-01 VITALS — Ht 66.0 in | Wt 325.0 lb

## 2019-12-01 DIAGNOSIS — Z1211 Encounter for screening for malignant neoplasm of colon: Secondary | ICD-10-CM

## 2019-12-01 DIAGNOSIS — Z01818 Encounter for other preprocedural examination: Secondary | ICD-10-CM

## 2019-12-01 DIAGNOSIS — R195 Other fecal abnormalities: Secondary | ICD-10-CM

## 2019-12-01 NOTE — Progress Notes (Signed)

## 2019-12-10 ENCOUNTER — Other Ambulatory Visit: Payer: Self-pay

## 2019-12-10 ENCOUNTER — Other Ambulatory Visit (HOSPITAL_COMMUNITY)
Admission: RE | Admit: 2019-12-10 | Discharge: 2019-12-10 | Disposition: A | Discharge status: Home / Self Care | Payer: PPO | Source: Ambulatory Visit | Attending: Gastroenterology | Admitting: Gastroenterology

## 2019-12-10 DIAGNOSIS — Z20822 Contact with and (suspected) exposure to covid-19: Secondary | ICD-10-CM | POA: Diagnosis not present

## 2019-12-10 DIAGNOSIS — Z01812 Encounter for preprocedural laboratory examination: Secondary | ICD-10-CM | POA: Diagnosis not present

## 2019-12-10 LAB — SARS CORONAVIRUS 2 (TAT 6-24 HRS): SARS Coronavirus 2: NEGATIVE

## 2019-12-14 ENCOUNTER — Encounter (HOSPITAL_COMMUNITY): Payer: Self-pay | Admitting: Gastroenterology

## 2019-12-14 ENCOUNTER — Ambulatory Visit (HOSPITAL_COMMUNITY): Payer: PPO | Admitting: Anesthesiology

## 2019-12-14 ENCOUNTER — Ambulatory Visit (HOSPITAL_COMMUNITY)
Admission: RE | Admit: 2019-12-14 | Discharge: 2019-12-14 | Disposition: A | Discharge status: Home / Self Care | Payer: PPO | Attending: Gastroenterology | Admitting: Gastroenterology

## 2019-12-14 ENCOUNTER — Other Ambulatory Visit: Payer: Self-pay

## 2019-12-14 ENCOUNTER — Encounter (HOSPITAL_COMMUNITY)
Admission: RE | Disposition: A | Discharge status: Home / Self Care | Payer: Self-pay | Source: Home / Self Care | Attending: Gastroenterology

## 2019-12-14 DIAGNOSIS — R195 Other fecal abnormalities: Secondary | ICD-10-CM | POA: Diagnosis not present

## 2019-12-14 DIAGNOSIS — M1990 Unspecified osteoarthritis, unspecified site: Secondary | ICD-10-CM | POA: Insufficient documentation

## 2019-12-14 DIAGNOSIS — Z96642 Presence of left artificial hip joint: Secondary | ICD-10-CM | POA: Insufficient documentation

## 2019-12-14 DIAGNOSIS — D128 Benign neoplasm of rectum: Secondary | ICD-10-CM | POA: Diagnosis not present

## 2019-12-14 DIAGNOSIS — Z1211 Encounter for screening for malignant neoplasm of colon: Secondary | ICD-10-CM

## 2019-12-14 DIAGNOSIS — K621 Rectal polyp: Secondary | ICD-10-CM

## 2019-12-14 DIAGNOSIS — I10 Essential (primary) hypertension: Secondary | ICD-10-CM | POA: Diagnosis not present

## 2019-12-14 DIAGNOSIS — Z6841 Body Mass Index (BMI) 40.0 and over, adult: Secondary | ICD-10-CM | POA: Insufficient documentation

## 2019-12-14 DIAGNOSIS — Z888 Allergy status to other drugs, medicaments and biological substances status: Secondary | ICD-10-CM | POA: Diagnosis not present

## 2019-12-14 DIAGNOSIS — Z87891 Personal history of nicotine dependence: Secondary | ICD-10-CM | POA: Diagnosis not present

## 2019-12-14 DIAGNOSIS — K5730 Diverticulosis of large intestine without perforation or abscess without bleeding: Secondary | ICD-10-CM | POA: Diagnosis not present

## 2019-12-14 HISTORY — PX: POLYPECTOMY: SHX5525

## 2019-12-14 HISTORY — PX: COLONOSCOPY WITH PROPOFOL: SHX5780

## 2019-12-14 SURGERY — COLONOSCOPY WITH PROPOFOL
Anesthesia: Monitor Anesthesia Care

## 2019-12-14 MED ORDER — LACTATED RINGERS IV SOLN
INTRAVENOUS | Status: AC | PRN
  Administered 2019-12-14: 1000 mL via INTRAVENOUS

## 2019-12-14 MED ORDER — LIDOCAINE 2% (20 MG/ML) 5 ML SYRINGE
INTRAMUSCULAR | Status: DC | PRN
  Administered 2019-12-14: 100 mg via INTRAVENOUS

## 2019-12-14 MED ORDER — SODIUM CHLORIDE 0.9 % IV SOLN: INTRAVENOUS | Status: DC

## 2019-12-14 MED ORDER — PROPOFOL 1000 MG/100ML IV EMUL
INTRAVENOUS | Status: AC
  Filled 2019-12-14: qty 100

## 2019-12-14 MED ORDER — PROPOFOL 500 MG/50ML IV EMUL
INTRAVENOUS | Status: DC | PRN
  Administered 2019-12-14: 125 ug/kg/min via INTRAVENOUS

## 2019-12-14 MED ORDER — PROPOFOL 10 MG/ML IV BOLUS
INTRAVENOUS | Status: DC | PRN
  Administered 2019-12-14 (×2): 20 mg via INTRAVENOUS

## 2019-12-14 SURGICAL SUPPLY — 21 items

## 2019-12-14 NOTE — Transfer of Care (Signed)
Immediate Anesthesia Transfer of Care Note  Patient: James [name]  Procedure(s) Performed: COLONOSCOPY WITH PROPOFOL (N/A ) POLYPECTOMY  Patient Location: Endoscopy Unit  Anesthesia Type:MAC  Level of Consciousness: awake  Airway & Oxygen Therapy: Patient Spontanous Breathing and Patient connected to face mask oxygen  Post-op Assessment: Report given to RN and Post -op Vital signs reviewed and stable  Post vital signs: Reviewed and stable  Last Vitals:  Vitals Value Taken Time  BP    Temp    Pulse    Resp    SpO2      Last Pain:  Vitals:   12/14/19 0829  TempSrc: Oral  PainSc: 0-No pain         Complications: No complications documented.

## 2019-12-14 NOTE — Anesthesia Postprocedure Evaluation (Signed)
Anesthesia Post Note  Patient: WESLEE FOGG  Procedure(s) Performed: COLONOSCOPY WITH PROPOFOL (N/A ) POLYPECTOMY     Patient location during evaluation: Endoscopy Anesthesia Type: MAC Level of consciousness: awake and alert Pain management: pain level controlled Vital Signs Assessment: post-procedure vital signs reviewed and stable Respiratory status: spontaneous breathing, nonlabored ventilation and respiratory function stable Cardiovascular status: blood pressure returned to baseline and stable Postop Assessment: no apparent nausea or vomiting Anesthetic complications: no   No complications documented.  Last Vitals:  Vitals:   12/14/19 0955 12/14/19 1005  BP: (!) 147/70 (!) 130/59  Pulse: (!) 55 (!) 53  Resp: 14 14  Temp:    SpO2: 100% 99%    Last Pain:  Vitals:   12/14/19 0955  TempSrc:   PainSc: 0-No pain                 Lynda Rainwater

## 2019-12-14 NOTE — H&P (Signed)
History:  This patient presents for endoscopic testing for positive cologuard  (patient seen prior to procedure - delayed note entry).  James [name] Referring physician: Dorothyann Peng, NP  Past Medical History: Past Medical History:  Diagnosis Date  . Arthritis   . Chicken pox   . Complication of anesthesia    difficulty waking up  . Hypertension   . Joint pain   . Obesity   . Osteoarthritis   . Prediabetes   . Swelling    feet and legs     Past Surgical History: Past Surgical History:  Procedure Laterality Date  . cartilage removal    . KNEE SURGERY  2000  . TOTAL HIP ARTHROPLASTY Left 02/11/2019   Procedure: TOTAL HIP ARTHROPLASTY-POSTERIOR;  Surgeon: Gaynelle Arabian, MD;  Location: [facility];  Service: Orthopedics;  Laterality: Left;  13min    Allergies: Allergies  Allergen Reactions  . Lipitor [Atorvastatin] Other (See Comments)    Lightheaded    Outpatient Meds: Current Facility-Administered Medications  Medication Dose Route Frequency Provider Last Rate Last Admin  . 0.9 %  sodium chloride infusion   Intravenous Continuous Danis, Estill Cotta III, MD          ___________________________________________________________________ Objective   Exam:  BP (!) 130/59   Pulse (!) 53   Temp 97.7 F (36.5 C) (Oral)   Resp 14   Ht 5\' 5"  (1.651 m)   Wt (!) 147.4 kg   SpO2 99%   BMI 54.08 kg/m    CV: RRR without murmur, S1/S2, no JVD, no peripheral edema  Resp: clear to auscultation bilaterally, normal RR and effort noted  GI: soft, no tenderness, with active bowel sounds. No guarding or palpable organomegaly noted.morbidly obese  Neuro: awake, alert and oriented x 3. Normal gross motor function and fluent speech   Assessment:  Positive cologuard  Plan:  colonoscopy   Nelida Meuse III

## 2019-12-14 NOTE — Anesthesia Preprocedure Evaluation (Addendum)
Anesthesia Evaluation  Patient identified by MRN, date of birth, ID band Patient awake    Reviewed: Allergy & Precautions, NPO status , Patient's Chart, lab work & pertinent test results  History of Anesthesia Complications (+) PROLONGED EMERGENCE  Airway Mallampati: II  TM Distance: >3 FB Neck ROM: Full   Comment: Large neck circumference Dental no notable dental hx.    Pulmonary former smoker,    Pulmonary exam normal breath sounds clear to auscultation       Cardiovascular Exercise Tolerance: Poor hypertension, Normal cardiovascular exam(-) Valvular Problems/Murmurs Rhythm:Regular Rate:Normal  Normal sinus rhythm Left axis deviation Non-specific intra-ventricular conduction delay Abnormal ECG No previous ECGs available Confirmed by Buford Dresser [phone]) on 02/05/2019 9:03:30 A   Neuro/Psych negative neurological ROS  negative psych ROS   GI/Hepatic negative GI ROS, Neg liver ROS,   Endo/Other  Morbid obesity (BMI 54)  Renal/GU negative Renal ROS  negative genitourinary   Musculoskeletal  (+) Arthritis , Osteoarthritis,    Abdominal (+) + obese,   Peds  Hematology negative hematology ROS (+)   Anesthesia Other Findings   Reproductive/Obstetrics                            Anesthesia Physical Anesthesia Plan  ASA: IV  Anesthesia Plan: MAC   Post-op Pain Management:    Induction: Intravenous  PONV Risk Score and Plan: 1 and Ondansetron and Treatment may vary due to age or medical condition  Airway Management Planned: Natural Airway  Additional Equipment:   Intra-op Plan:   Post-operative Plan:   Informed Consent: I have reviewed the patients History and Physical, chart, labs and discussed the procedure including the risks, benefits and alternatives for the proposed anesthesia with the patient or authorized representative who has indicated his/her understanding and  acceptance.     Dental advisory given  Plan Discussed with: CRNA and Anesthesiologist  Anesthesia Plan Comments:         Anesthesia Quick Evaluation

## 2019-12-14 NOTE — Anesthesia Procedure Notes (Signed)
Date/Time: 12/14/2019 9:01 AM Performed by: Sharlette Dense, CRNA Oxygen Delivery Method: Simple face mask

## 2019-12-14 NOTE — Op Note (Signed)
[facility] Patient Name: James [name] Procedure Date: 12/14/2019 MRN: [id number] Attending MD: Estill Cotta. Loletha Carrow , MD Date of Birth: 1955-12-14 CSN: [id number] Age: 65 Admit Type: Outpatient Procedure:                Colonoscopy Indications:              Positive Cologuard test Providers:                Mallie Mussel L. Loletha Carrow, MD, Josie Dixon, RN, Tyrone Apple, Technician, Danley Danker, CRNA Referring MD:              Medicines:                Monitored Anesthesia Care Complications:            No immediate complications. Estimated Blood Loss:     Estimated blood loss was minimal. Procedure:                Pre-Anesthesia Assessment:                           - Prior to the procedure, a History and Physical                            was performed, and patient medications and                            allergies were reviewed. The patient's tolerance of                            previous anesthesia was also reviewed. The risks                            and benefits of the procedure and the sedation                            options and risks were discussed with the patient.                            All questions were answered, and informed consent                            was obtained. Prior Anticoagulants: The patient has                            taken no previous anticoagulant or antiplatelet                            agents. ASA Grade Assessment: III - A patient with                            severe systemic disease. After reviewing the risks  and benefits, the patient was deemed in                            satisfactory condition to undergo the procedure.                           After obtaining informed consent, the colonoscope                            was passed under direct vision. Throughout the                            procedure, the patient's blood pressure, pulse, and                             oxygen saturations were monitored continuously. The                            CF-HQ190L ([id number]) Olympus colonoscope was                            introduced through the anus and advanced to the the                            cecum, identified by appendiceal orifice and                            ileocecal valve. The colonoscopy was performed                            without difficulty. The patient tolerated the                            procedure well. The quality of the bowel                            preparation was excellent. The ileocecal valve,                            appendiceal orifice, and rectum were photographed. Scope In: 9:11:01 AM Scope Out: 9:32:24 AM Scope Withdrawal Time: 0 hours 16 minutes 16 seconds  Total Procedure Duration: 0 hours 21 minutes 23 seconds  Findings:      The perianal and digital rectal examinations were normal.      Multiple small-mouthed diverticula were found in the sigmoid colon.      A 4 mm polyp was found in the proximal rectum. The polyp was       semi-sessile. The polyp was removed with a cold snare. Resection and       retrieval were complete.      A 15x4 mm polyp was found in the mid rectum. The polyp was pedunculated       (left postero-lateral wall). The polyp was removed with a hot snare.       Resection and retrieval were complete (entire stalk removed).      The exam was otherwise  without abnormality on direct and retroflexion       views. Impression:               - Diverticulosis in the sigmoid colon.                           - One 4 mm polyp in the proximal rectum, removed                            with a cold snare. Resected and retrieved.                           - One 15x4 mm polyp in the mid rectum, removed with                            a hot snare. Resected and retrieved.                           - The examination was otherwise normal on direct                            and retroflexion views. Moderate  Sedation:      MAC sedation used Recommendation:           - Patient has a contact number available for                            emergencies. The signs and symptoms of potential                            delayed complications were discussed with the                            patient. Return to normal activities tomorrow.                            Written discharge instructions were provided to the                            patient.                           - Resume previous diet.                           - Continue present medications.                           - Await pathology results.                           - Repeat colonoscopy is recommended for                            surveillance. The colonoscopy date will be  determined after pathology results from today's                            exam become available for review. Procedure Code(s):        --- Professional ---                           [phone], Colonoscopy, flexible; with removal of                            tumor(s), polyp(s), or other lesion(s) by snare                            technique Diagnosis Code(s):        --- Professional ---                           K62.1, Rectal polyp                           R19.5, Other fecal abnormalities                           K57.30, Diverticulosis of large intestine without                            perforation or abscess without bleeding CPT copyright 2019 American Medical Association. All rights reserved. The codes documented in this report are preliminary and upon coder review may  be revised to meet current compliance requirements. Curstin Schmale L. Loletha Carrow, MD 12/14/2019 9:45:27 AM This report has been signed electronically. Number of Addenda: 0

## 2019-12-14 NOTE — Discharge Instructions (Signed)

## 2019-12-15 ENCOUNTER — Encounter: Payer: Self-pay | Admitting: Gastroenterology

## 2019-12-15 ENCOUNTER — Encounter (HOSPITAL_COMMUNITY): Payer: Self-pay | Admitting: Gastroenterology

## 2019-12-15 LAB — SURGICAL PATHOLOGY

## 2019-12-22 ENCOUNTER — Encounter: Payer: Self-pay | Admitting: Adult Health

## 2019-12-23 ENCOUNTER — Other Ambulatory Visit: Payer: Self-pay | Admitting: Adult Health

## 2019-12-23 DIAGNOSIS — I10 Essential (primary) hypertension: Secondary | ICD-10-CM

## 2019-12-23 MED ORDER — LISINOPRIL 10 MG PO TABS: 20.0000 mg | ORAL_TABLET | Freq: Every day | ORAL | 3 refills | Status: DC

## 2020-01-14 ENCOUNTER — Encounter: Payer: PPO | Admitting: Adult Health

## 2020-01-19 ENCOUNTER — Other Ambulatory Visit: Payer: Self-pay

## 2020-01-19 ENCOUNTER — Ambulatory Visit (INDEPENDENT_AMBULATORY_CARE_PROVIDER_SITE_OTHER): Payer: PPO | Admitting: Adult Health

## 2020-01-19 VITALS — BP 120/86 | HR 88 | Temp 99.5°F | Ht 66.0 in | Wt 336.0 lb

## 2020-01-19 DIAGNOSIS — I10 Essential (primary) hypertension: Secondary | ICD-10-CM | POA: Diagnosis not present

## 2020-01-19 DIAGNOSIS — Z23 Encounter for immunization: Secondary | ICD-10-CM | POA: Diagnosis not present

## 2020-01-19 DIAGNOSIS — Z0000 Encounter for general adult medical examination without abnormal findings: Secondary | ICD-10-CM | POA: Diagnosis not present

## 2020-01-19 DIAGNOSIS — R7303 Prediabetes: Secondary | ICD-10-CM | POA: Diagnosis not present

## 2020-01-19 DIAGNOSIS — Z125 Encounter for screening for malignant neoplasm of prostate: Secondary | ICD-10-CM

## 2020-01-19 NOTE — Patient Instructions (Signed)
It was great seeing you today   We will follow up with you regarding your blood work   Someone will call you from the weight loss center.

## 2020-01-19 NOTE — Progress Notes (Signed)
Subjective:    Patient ID: James [name], male    DOB: Sep 14, 1955, 64 y.o.   MRN: [id number]  HPI Patient presents for yearly preventative medicine examination. He is a pleasant 64 year old male who  has a past medical history of Arthritis, Chicken pox, Complication of anesthesia, Hypertension, Joint pain, Obesity, Osteoarthritis, Prediabetes, and Swelling.  Essential Hypertension -he is currently prescribed lisinopril 20 mg daily and lasix 20 mg daily.  He denies dizziness, lightheadedness, chest pain, or shortness of breath  BP Readings from Last 3 Encounters:  01/19/20 120/86  12/14/19 (!) 130/59  11/18/19 130/84    History of Colon Polyps -had a colonoscopy in 12/2019 after a positive Cologuard screen which showed a 4 mm polyp in the proximal rectum and a 15 x 4 mm polyp in the mid rectum.  Morbid Obesity -weight continues to increase.  He is not doing much exercise to becoming shortness of breath with exertion.  He has had a cardiac work-up done recently that was negative.  In the past he did go to the [facility] center and did lose weight.  He would like to be referred back to that facility. Wt Readings from Last 3 Encounters:  01/19/20 (!) 336 lb (152.4 kg)  12/14/19 (!) 324 lb 15.3 oz (147.4 kg)  12/01/19 (!) 325 lb (147.4 kg)   Glucose Intolerance - not currently on medications.  Lab Results  Component Value Date   HGBA1C 5.8 10/15/2019   All immunizations and health maintenance protocols were reviewed with the patient and needed orders were placed. He is due for his second   Appropriate screening laboratory values were ordered for the patient including screening of hyperlipidemia, renal function and hepatic function. If indicated by BPH, a PSA was ordered.  Medication reconciliation,  past medical history, social history, problem list and allergies were reviewed in detail with the patient  Goals were established with regard to weight loss, exercise,  and  diet in compliance with medications   Review of Systems  Constitutional: Negative.   HENT: Negative.   Eyes: Negative.   Respiratory: Negative.   Cardiovascular: Negative.   Gastrointestinal: Negative.   Endocrine: Negative.   Genitourinary: Negative.   Musculoskeletal: Negative.   Skin: Negative.   Allergic/Immunologic: Negative.   Neurological: Negative.   Hematological: Negative.   Psychiatric/Behavioral: Negative.   All other systems reviewed and are negative.    Past Medical History:  Diagnosis Date  . Arthritis   . Chicken pox   . Complication of anesthesia    difficulty waking up  . Hypertension   . Joint pain   . Obesity   . Osteoarthritis   . Prediabetes   . Swelling    feet and legs    Social History   Socioeconomic History  . Marital status: Married    Spouse name: Not on file  . Number of children: Not on file  . Years of education: Not on file  . Highest education level: Not on file  Occupational History  . Occupation: At home caregiver  Tobacco Use  . Smoking status: Former Smoker    Quit date: 07/16/1992    Years since quitting: 27.5  . Smokeless tobacco: Never Used  Substance and Sexual Activity  . Alcohol use: Yes    Comment: very little per patient   . Drug use: No  . Sexual activity: Not on file  Other Topics Concern  . Not on file  Social History Narrative  Work or School: self employed, Risk manager - [employer] Situation: living with and caring for wife whom had stroke and brain tumor      Spiritual Beliefs: [employer]      Lifestyle: no regular exercise, diet poor            Social Determinants of Health   Financial Resource Strain:   . Difficulty of Paying Living Expenses: Not on file  Food Insecurity:   . Worried About Charity fundraiser in the Last Year: Not on file  . Ran Out of Food in the Last Year: Not on file  Transportation Needs:   . Lack of Transportation (Medical): Not on file  . Lack  of Transportation (Non-Medical): Not on file  Physical Activity:   . Days of Exercise per Week: Not on file  . Minutes of Exercise per Session: Not on file  Stress:   . Feeling of Stress : Not on file  Social Connections:   . Frequency of Communication with Friends and Family: Not on file  . Frequency of Social Gatherings with Friends and Family: Not on file  . Attends Religious Services: Not on file  . Active Member of Clubs or Organizations: Not on file  . Attends Archivist Meetings: Not on file  . Marital Status: Not on file  Intimate Partner Violence:   . Fear of Current or Ex-Partner: Not on file  . Emotionally Abused: Not on file  . Physically Abused: Not on file  . Sexually Abused: Not on file    Past Surgical History:  Procedure Laterality Date  . cartilage removal    . COLONOSCOPY WITH PROPOFOL N/A 12/14/2019   Procedure: COLONOSCOPY WITH PROPOFOL;  Surgeon: Doran Stabler, MD;  Location: [facility];  Service: Gastroenterology;  Laterality: N/A;  . KNEE SURGERY  2000  . POLYPECTOMY  12/14/2019   Procedure: POLYPECTOMY;  Surgeon: Doran Stabler, MD;  Location: [facility];  Service: Gastroenterology;;  . TOTAL HIP ARTHROPLASTY Left 02/11/2019   Procedure: TOTAL HIP [place];  Surgeon: Gaynelle Arabian, MD;  Location: [facility];  Service: Orthopedics;  Laterality: Left;  179mn    Family History  Problem Relation Age of Onset  . Cancer Mother        lung and soft tissue cancer from radiation  . Stroke Mother   . Obesity Mother   . Heart disease Father        CHF  . Diabetes Father   . Hypertension Father   . Obesity Father   . Heart disease Maternal Grandfather   . Diabetes Paternal Grandmother   . Ovarian cancer Sister   . Colon cancer Neg Hx   . Colon polyps Neg Hx   . Esophageal cancer Neg Hx   . Rectal cancer Neg Hx   . Stomach cancer Neg Hx     Allergies  Allergen Reactions  . Lipitor [Atorvastatin] Other (See Comments)     Lightheaded    Current Outpatient Medications on File Prior to Visit  Medication Sig Dispense Refill  . acetaminophen (TYLENOL) 500 MG tablet Take 1,000 mg by mouth at bedtime as needed for moderate pain.    .Marland Kitchenalbuterol (VENTOLIN HFA) 108 (90 Base) MCG/ACT inhaler Inhale 2 puffs into the lungs every 4 (four) hours as needed for wheezing or shortness of breath. 18 g 0  . cetirizine (ZYRTEC) 10 MG tablet Take 10 mg by mouth daily.    .Marland Kitchen  cyclobenzaprine (FLEXERIL) 10 MG tablet Take 10 mg by mouth daily as needed for muscle spasms.     . fluticasone (FLONASE) 50 MCG/ACT nasal spray Place 1 spray into both nostrils daily.     . furosemide (LASIX) 20 MG tablet TAKE 1 TABLET BY MOUTH EVERY DAY (Patient taking differently: Take 20 mg by mouth daily. ) 30 tablet 0  . ibuprofen (ADVIL) 200 MG tablet Take 400 mg by mouth at bedtime as needed for moderate pain.     Marland Kitchen Ketotifen Fumarate (ALLERGY EYE DROPS OP) Place 1 drop into both eyes daily as needed (allergies).    Marland Kitchen lidocaine (LMX) 4 % cream Apply 1 application topically daily as needed (pain).    Marland Kitchen lisinopril (ZESTRIL) 10 MG tablet Take 2 tablets (20 mg total) by mouth daily. 90 tablet 3  . Multiple Vitamin (MULTIVITAMIN) tablet Take 1 tablet by mouth daily.    Marland Kitchen amoxicillin (AMOXIL) 500 MG capsule Take 2,000 mg by mouth See admin instructions. Take 2000 mg 1 hour prior to dental work (Patient not taking: Reported on 01/19/2020)    . Polyethylene Glycol 3350 (MIRALAX PO) Take 238 g by mouth once. Colonoscopy bowel prep     No current facility-administered medications on file prior to visit.    BP 120/86   Pulse 88   Temp 99.5 F (37.5 C)   Ht _0  (1.676 m)   Wt (!) 336 lb (152.4 kg)   SpO2 97%   BMI 54.23 kg/m       Objective:   Physical Exam Vitals and nursing note reviewed.  Constitutional:      General: He is not in acute distress.    Appearance: Normal appearance. He is well-developed. He is obese.  HENT:     Head: Normocephalic  and atraumatic.     Right Ear: Tympanic membrane, ear canal and external ear normal. There is no impacted cerumen.     Left Ear: Tympanic membrane, ear canal and external ear normal. There is no impacted cerumen.     Nose: Nose normal. No congestion or rhinorrhea.     Mouth/Throat:     Mouth: Mucous membranes are moist.     Pharynx: Oropharynx is clear. No oropharyngeal exudate or posterior oropharyngeal erythema.  Eyes:     General:        Right eye: No discharge.        Left eye: No discharge.     Extraocular Movements: Extraocular movements intact.     Conjunctiva/sclera: Conjunctivae normal.     Pupils: Pupils are equal, round, and reactive to light.  Neck:     Vascular: No carotid bruit.     Trachea: No tracheal deviation.  Cardiovascular:     Rate and Rhythm: Normal rate and regular rhythm.     Pulses: Normal pulses.     Heart sounds: Normal heart sounds. No murmur heard.  No friction rub. No gallop.   Pulmonary:     Effort: Pulmonary effort is normal. No respiratory distress.     Breath sounds: Normal breath sounds. No stridor. No wheezing, rhonchi or rales.  Chest:     Chest wall: No tenderness.  Abdominal:     General: Bowel sounds are normal. There is no distension.     Palpations: Abdomen is soft. There is no mass.     Tenderness: There is no abdominal tenderness. There is no right CVA tenderness, left CVA tenderness, guarding or rebound.     Hernia: No hernia  is present.  Musculoskeletal:        General: No swelling, tenderness, deformity or signs of injury. Normal range of motion.     Right lower leg: No edema.     Left lower leg: No edema.  Lymphadenopathy:     Cervical: No cervical adenopathy.  Skin:    General: Skin is warm and dry.     Capillary Refill: Capillary refill takes less than 2 seconds.     Coloration: Skin is not jaundiced or pale.     Findings: No bruising, erythema, lesion or rash.  Neurological:     General: No focal deficit present.      Mental Status: He is alert and oriented to person, place, and time.     Cranial Nerves: No cranial nerve deficit.     Sensory: No sensory deficit.     Motor: No weakness.     Coordination: Coordination normal.     Gait: Gait normal.     Deep Tendon Reflexes: Reflexes normal.  Psychiatric:        Mood and Affect: Mood normal.        Behavior: Behavior normal.        Thought Content: Thought content normal.        Judgment: Judgment normal.       Assessment & Plan:  1. Routine general medical examination at a health care facility -Significant weight loss for overall health.  Follow-up in 1 year or sooner if needed - CBC with Differential/Platelet; Future - Hemoglobin A1c; Future - Lipid panel; Future - TSH; Future - CMP with eGFR([facility]); Future - CMP with eGFR([facility]) - TSH - Lipid panel - Hemoglobin A1c - CBC with Differential/Platelet  2. Essential hypertension -BP well controlled with lisinopril 10 mg and Lasix 20 mg daily.  No change in medication - CBC with Differential/Platelet; Future - Hemoglobin A1c; Future - Lipid panel; Future - TSH; Future - CMP with eGFR([facility]); Future - CMP with eGFR([facility]) - TSH - Lipid panel - Hemoglobin A1c - CBC with Differential/Platelet  3. Prediabetes -Consider Metformin - CBC with Differential/Platelet; Future - Hemoglobin A1c; Future - Lipid panel; Future - TSH; Future - CMP with eGFR([facility]); Future - CMP with eGFR([facility]) - TSH - Lipid panel - Hemoglobin A1c - CBC with Differential/Platelet  4. Severe obesity (BMI >= 40) (HCC) -Encourage aerobic exercise and heart healthy diet.  Will refer back to medical weight loss management - CBC with Differential/Platelet; Future - Hemoglobin A1c; Future - Lipid panel; Future - TSH; Future - CMP with eGFR([facility]); Future - CMP with eGFR([facility]) - TSH - Lipid panel - Hemoglobin A1c - CBC with Differential/Platelet - Amb Ref to Medical Weight Management  5. Prostate cancer  screening  - PSA; Future - PSA  Dorothyann Peng, NP

## 2020-01-20 LAB — CBC WITH DIFFERENTIAL/PLATELET
Absolute Monocytes: 511 cells/uL (ref 200–950)
Basophils Absolute: 21 cells/uL (ref 0–200)
Basophils Relative: 0.3 %
Eosinophils Absolute: 43 cells/uL (ref 15–500)
Eosinophils Relative: 0.6 %
HCT: 44.1 % (ref 38.5–50.0)
Hemoglobin: 14.5 g/dL (ref 13.2–17.1)
Lymphs Abs: 1427 cells/uL (ref 850–3900)
MCH: 29.4 pg (ref 27.0–33.0)
MCHC: 32.9 g/dL (ref 32.0–36.0)
MCV: 89.3 fL (ref 80.0–100.0)
MPV: 11.5 fL (ref 7.5–12.5)
Monocytes Relative: 7.2 %
Neutro Abs: 5098 cells/uL (ref 1500–7800)
Neutrophils Relative %: 71.8 %
Platelets: 208 10*3/uL (ref 140–400)
RBC: 4.94 10*6/uL (ref 4.20–5.80)
RDW: 14 % (ref 11.0–15.0)
Total Lymphocyte: 20.1 %
WBC: 7.1 10*3/uL (ref 3.8–10.8)

## 2020-01-20 LAB — TSH: TSH: 2.34 mIU/L (ref 0.40–4.50)

## 2020-01-20 LAB — COMPLETE METABOLIC PANEL WITH GFR
AG Ratio: 1.3 (calc) (ref 1.0–2.5)
ALT: 22 U/L (ref 9–46)
AST: 25 U/L (ref 10–35)
Albumin: 4 g/dL (ref 3.6–5.1)
Alkaline phosphatase (APISO): 60 U/L (ref 35–144)
BUN: 17 mg/dL (ref 7–25)
CO2: 26 mmol/L (ref 20–32)
Calcium: 8.6 mg/dL (ref 8.6–10.3)
Chloride: 104 mmol/L (ref 98–110)
Creat: 1.04 mg/dL (ref 0.70–1.25)
GFR, Est African American: 86 mL/min/{1.73_m2} (ref >=60)
GFR, Est Non African American: 74 mL/min/{1.73_m2} (ref >=60)
Globulin: 3 g/dL (calc) (ref 1.9–3.7)
Glucose, Bld: 108 mg/dL — ABNORMAL HIGH (ref 65–99)
Potassium: 4.8 mmol/L (ref 3.5–5.3)
Sodium: 139 mmol/L (ref 135–146)
Total Bilirubin: 0.4 mg/dL (ref 0.2–1.2)
Total Protein: 7 g/dL (ref 6.1–8.1)

## 2020-01-20 LAB — LIPID PANEL
Cholesterol: 186 mg/dL (ref <200)
HDL: 52 mg/dL (ref >=40)
LDL Cholesterol (Calc): 105 mg/dL (calc) — ABNORMAL HIGH
Non-HDL Cholesterol (Calc): 134 mg/dL (calc) — ABNORMAL HIGH (ref <130)
Total CHOL/HDL Ratio: 3.6 (calc) (ref <5.0)
Triglycerides: 171 mg/dL — ABNORMAL HIGH (ref <150)

## 2020-01-20 LAB — HEMOGLOBIN A1C
Hgb A1c MFr Bld: 5.7 % of total Hgb — ABNORMAL HIGH (ref <5.7)
Mean Plasma Glucose: 117 (calc)
eAG (mmol/L): 6.5 (calc)

## 2020-01-20 LAB — PSA: PSA: 0.47 ng/mL (ref <=4.0)

## 2020-01-22 ENCOUNTER — Encounter (INDEPENDENT_AMBULATORY_CARE_PROVIDER_SITE_OTHER): Payer: Self-pay

## 2020-01-24 IMAGING — DX DG PORTABLE PELVIS
1 series · 1 of 1 positions shown · non-contrast
Comparison: None.

CLINICAL DATA: Left total hip arthroplasty.

EXAM:
PORTABLE PELVIS 1-2 VIEWS

[pelvis ap]
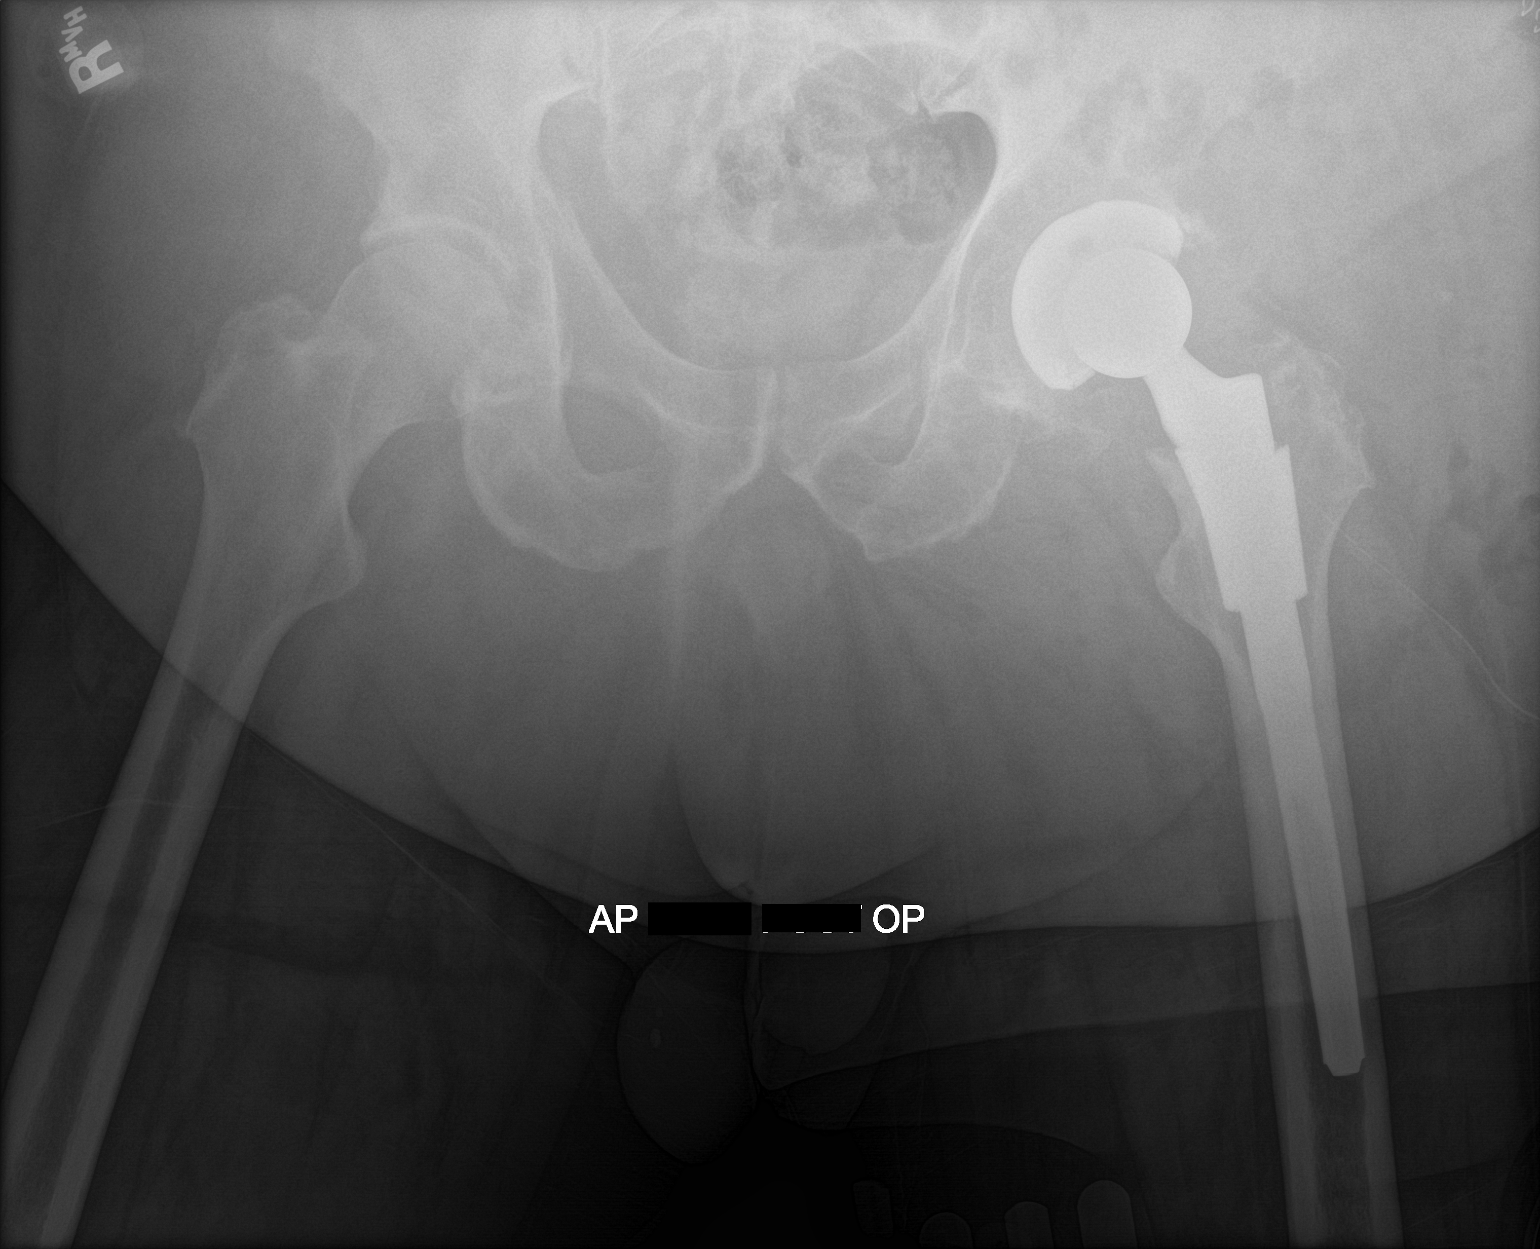

[1 of 1 positions shown; findings below may reference images not displayed]

FINDINGS: The left hip demonstrates a total arthroplasty without evidence of
hardware failure or complication. Mild irregularity of the greater
trochanter is likely postsurgical. There is expected intra-articular
air. There is no fracture or dislocation. The alignment is anatomic.
The right hip joint space is preserved. Post-surgical changes noted
in the surrounding soft tissues with surgical drain in place.
IMPRESSION: 1. Interval left total hip arthroplasty without acute postoperative
complication.

## 2020-02-08 ENCOUNTER — Other Ambulatory Visit: Payer: Self-pay

## 2020-02-08 ENCOUNTER — Encounter (INDEPENDENT_AMBULATORY_CARE_PROVIDER_SITE_OTHER): Payer: Self-pay | Admitting: Family Medicine

## 2020-02-08 ENCOUNTER — Ambulatory Visit (INDEPENDENT_AMBULATORY_CARE_PROVIDER_SITE_OTHER): Payer: PPO | Admitting: Family Medicine

## 2020-02-08 VITALS — BP 149/74 | HR 78 | Temp 98.2°F | Ht 64.0 in | Wt 332.0 lb

## 2020-02-08 DIAGNOSIS — I10 Essential (primary) hypertension: Secondary | ICD-10-CM | POA: Diagnosis not present

## 2020-02-08 DIAGNOSIS — R5383 Other fatigue: Secondary | ICD-10-CM

## 2020-02-08 DIAGNOSIS — Z6841 Body Mass Index (BMI) 40.0 and over, adult: Secondary | ICD-10-CM

## 2020-02-08 DIAGNOSIS — E559 Vitamin D deficiency, unspecified: Secondary | ICD-10-CM

## 2020-02-08 DIAGNOSIS — R0602 Shortness of breath: Secondary | ICD-10-CM

## 2020-02-08 DIAGNOSIS — R7303 Prediabetes: Secondary | ICD-10-CM

## 2020-02-08 DIAGNOSIS — Z0289 Encounter for other administrative examinations: Secondary | ICD-10-CM

## 2020-02-08 DIAGNOSIS — F3289 Other specified depressive episodes: Secondary | ICD-10-CM

## 2020-02-08 MED ORDER — SERTRALINE HCL 25 MG PO TABS: 25.0000 mg | ORAL_TABLET | Freq: Every day | ORAL | 0 refills | Status: DC

## 2020-02-09 LAB — COMPREHENSIVE METABOLIC PANEL
ALT: 24 IU/L (ref 0–44)
AST: 19 IU/L (ref 0–40)
Albumin/Globulin Ratio: 1.5 (ref 1.2–2.2)
Albumin: 4.2 g/dL (ref 3.8–4.8)
Alkaline Phosphatase: 71 IU/L (ref 44–121)
BUN/Creatinine Ratio: 17 (ref 10–24)
BUN: 16 mg/dL (ref 8–27)
Bilirubin Total: 0.3 mg/dL (ref 0.0–1.2)
CO2: 26 mmol/L (ref 20–29)
Calcium: 9 mg/dL (ref 8.6–10.2)
Chloride: 99 mmol/L (ref 96–106)
Creatinine, Ser: 0.96 mg/dL (ref 0.76–1.27)
GFR calc Af Amer: 95 mL/min/{1.73_m2} (ref >59)
GFR calc non Af Amer: 82 mL/min/{1.73_m2} (ref >59)
Globulin, Total: 2.8 g/dL (ref 1.5–4.5)
Glucose: 86 mg/dL (ref 65–99)
Potassium: 4.5 mmol/L (ref 3.5–5.2)
Sodium: 140 mmol/L (ref 134–144)
Total Protein: 7 g/dL (ref 6.0–8.5)

## 2020-02-09 LAB — T3: T3, Total: 113 ng/dL (ref 71–180)

## 2020-02-09 LAB — VITAMIN D 25 HYDROXY (VIT D DEFICIENCY, FRACTURES): Vit D, 25-Hydroxy: 28.1 ng/mL — ABNORMAL LOW (ref 30.0–100.0)

## 2020-02-09 LAB — T4: T4, Total: 5.5 ug/dL (ref 4.5–12.0)

## 2020-02-09 LAB — INSULIN, RANDOM: INSULIN: 23.5 u[IU]/mL (ref 2.6–24.9)

## 2020-02-09 NOTE — Progress Notes (Signed)
Chief Complaint:   James [name] (MR# [id number]) is a 63 y.o. male who presents for evaluation and treatment of obesity and related comorbidities. Current BMI is Body mass index is 56.99 kg/m. James [name] has been struggling with his weight for many years and has been unsuccessful in either losing weight, maintaining weight loss, or reaching his healthy weight goal.  James [name] is a returning patient to our clinic. He notes significant stress with being a caregiver for his wife. He is ordering take out 4-5 times per week. He is doing Chick fila or chain restaurants. He is trying to eat more vegetarian. Coffee and between 9-10 am is Raisin Bran or Shredded Wheat with unsweetened vanilla almond milk (feels satisfied). Around 2 pm is [place] sandwich, 3-4 oz of [place] with Triscuit crackers (whole box), just wants to eat. Dinner is between 630-70 pm, doing 4-6 oz of chicken or pork or shrimp with salad, with [place] dressing. After dinner, is enlightened or yasso bars 1-2.  James [name] is currently in the action stage of change and ready to dedicate time achieving and maintaining a healthier weight. James [name] is interested in becoming our patient and working on intensive lifestyle modifications including (but not limited to) diet and exercise for weight loss.  James [name]'s habits were reviewed today and are as follows: His family eats meals together, he thinks his family will eat healthier with him, his desired weight loss is 177 lbs, he has been heavy most of his life, he started gaining weight 30 years ago, his heaviest weight ever was 365 pounds, he has significant food cravings issues, he is trying to follow a vegetarian diet, he frequently eats larger portions than normal and he struggles with emotional eating.  Depression Screen James [name]'s Food and Mood (modified PHQ-9) score was 19.  Depression screen PHQ 2/9 02/08/2020  Decreased Interest 3  Down, Depressed, Hopeless 2  PHQ - 2 Score 5  Altered sleeping  3  Tired, decreased energy 3  Change in appetite 3  Feeling bad or failure about yourself  1  Trouble concentrating 1  Moving slowly or fidgety/restless 2  Suicidal thoughts 1  PHQ-9 Score 19  Difficult doing work/chores Very difficult   Subjective:   1. Other fatigue James [name] admits to daytime somnolence and admits to waking up still tired. Patent has a history of symptoms of daytime fatigue and morning headache. James [name] generally gets 8 hours of sleep per night, and states that he has nightime awakenings. Snoring is present. Apneic episodes are present. Epworth Sleepiness Score is 17. EKG-normal sinus at 80 BPM.  2. SOB (shortness of breath) on exertion James [name] notes increasing shortness of breath with exercising and seems to be worsening over time with weight gain. He notes getting out of breath sooner with activity than he used to. This has not gotten worse recently. James [name] denies shortness of breath at rest or orthopnea.  3. Essential hypertension James [name]'s blood pressure is slightly elevated today. He denies chest pain, chest pressure, or headache. He is on lisinopril 20 mg daily.  4. Vitamin D deficiency James [name]'s last Vit D was on 10/24/2018 and of 34.3. He notes fatigue.  5. Pre-diabetes James [name] has been pre-diabetic for 10-12 years. He was previously on metformin. Last A1c was 5.7.  6. Other depression, with emotional eating James [name] has tried to seek counseling in the past, but had unfortunately a less than desirable experience with caregiver for his wife. He has never been on medications.  Assessment/Plan:  1. Other fatigue James [name] does feel that his weight is causing his energy to be lower than it should be. Fatigue may be related to obesity, depression or many other causes. Labs will be ordered, and in the meanwhile, James [name] will focus on self care including making healthy food choices, increasing physical activity and focusing on stress reduction. We will refer to [name]  [name] for sleep study.  - EKG 12-Lead - T3 - T4  2. SOB (shortness of breath) on exertion James [name] does feel that he gets out of breath more easily that he used to when he exercises. James [name]'s shortness of breath appears to be obesity related and exercise induced. He has agreed to work on weight loss and gradually increase exercise to treat his exercise induced shortness of breath. Will continue to monitor closely.   3. Essential hypertension James [name] is working on healthy weight loss and exercise to improve blood pressure control. We will watch for signs of hypotension as he continues his lifestyle modifications. We will check labs today.  - Comprehensive metabolic panel  4. Vitamin D deficiency Low Vitamin D level contributes to fatigue and are associated with obesity, breast, and colon cancer. We will check labs today. James [name] will follow-up for routine testing of Vitamin D, at least 2-3 times per year to avoid over-replacement.  - VITAMIN D 25 Hydroxy (Vit-D Deficiency, Fractures)  5. Pre-diabetes James [name] will continue to work on weight loss, exercise, and decreasing simple carbohydrates to help decrease the risk of diabetes. We will check labs today.  - Insulin, random  6. Other depression, with emotional eating Behavior modification techniques were discussed today to help James [name] deal with his emotional/non-hunger eating behaviors. Marks agreed to start sertraline 25 mg PO daily with no refills. We will refer to [name] for counseling. Orders and follow up as documented in patient record.   - sertraline (ZOLOFT) 25 MG tablet; Take 1 tablet (25 mg total) by mouth daily.  Dispense: 30 tablet; Refill: 0  7. Class 3 severe obesity with serious comorbidity and body mass index (BMI) of 50.0 to 59.9 in adult, unspecified obesity type James [name]) James [name] is currently in the action stage of change and his goal is to continue with weight loss efforts. I recommend James [name] begin the structured treatment  plan as follows:  He has agreed to the Category 4 Plan.  Exercise goals: As is.   Behavioral modification strategies: increasing lean protein intake, meal planning and cooking strategies, keeping healthy foods in the home and planning for success.  He was informed of the importance of frequent follow-up visits to maximize his success with intensive lifestyle modifications for his multiple health conditions. He was informed we would discuss his lab results at his next visit unless there is a critical issue that needs to be addressed sooner. Saurav agreed to keep his next visit at the agreed upon time to discuss these results.  Objective:   Blood pressure (!) 149/74, pulse 78, temperature 98.2 F (36.8 C), temperature source Oral, height 5\' 4"  (1.626 m), weight (!) 332 lb (150.6 kg), SpO2 95 %. Body mass index is 56.99 kg/m.  EKG: Normal sinus rhythm, rate 80 BPM..  Indirect Calorimeter completed today shows a VO2 of 372 and a REE of 2590.  His calculated basal metabolic rate is 8185 thus his basal metabolic rate is better than expected.  General: Cooperative, alert, well developed, in no acute distress. HEENT: Conjunctivae and lids unremarkable. Cardiovascular: Regular rhythm.  Lungs: Normal work of breathing.  Neurologic: No focal deficits.   Lab Results  Component Value Date   CREATININE 0.96 02/08/2020   BUN 16 02/08/2020   NA 140 02/08/2020   K 4.5 02/08/2020   CL 99 02/08/2020   CO2 26 02/08/2020   Lab Results  Component Value Date   ALT 24 02/08/2020   AST 19 02/08/2020   ALKPHOS 71 02/08/2020   BILITOT 0.3 02/08/2020   Lab Results  Component Value Date   HGBA1C 5.7 (H) 01/19/2020   HGBA1C 5.8 10/15/2019   HGBA1C 5.8 01/13/2019   HGBA1C 5.6 10/23/2017   HGBA1C 5.4 04/10/2017   Lab Results  Component Value Date   INSULIN 23.5 02/08/2020   INSULIN 13.4 04/10/2017   INSULIN 25.3 (H) 01/01/2017   INSULIN 17.2 09/04/2016   Lab Results  Component Value Date    TSH 2.34 01/19/2020   Lab Results  Component Value Date   CHOL 186 01/19/2020   HDL 52 01/19/2020   LDLCALC 105 (H) 01/19/2020   TRIG 171 (H) 01/19/2020   CHOLHDL 3.6 01/19/2020   Lab Results  Component Value Date   WBC 7.1 01/19/2020   HGB 14.5 01/19/2020   HCT 44.1 01/19/2020   MCV 89.3 01/19/2020   PLT 208 01/19/2020   No results found for: IRON, TIBC, FERRITIN Obesity Behavioral Intervention:   Approximately 15 minutes were spent on the discussion below.  ASK: We discussed the diagnosis of obesity with James [name] today and James [name] agreed to give [name] permission to discuss obesity behavioral modification therapy today.  ASSESS: Abdurahman has the diagnosis of obesity and his BMI today is 56.96. Jenner is in the action stage of change.   ADVISE: English was educated on the multiple health risks of obesity as well as the benefit of weight loss to improve his health. He was advised of the need for long term treatment and the importance of lifestyle modifications to improve his current health and to decrease his risk of future health problems.  AGREE: Multiple dietary modification options and treatment options were discussed and [name] agreed to follow the recommendations documented in the above note.  ARRANGE: Zaquan was educated on the importance of frequent visits to treat obesity as outlined per CMS and USPSTF guidelines and agreed to schedule his next follow up appointment today.  Attestation Statements:   Reviewed by clinician on day of visit: allergies, medications, problem list, medical history, surgical history, family history, social history, and previous encounter notes.   I, Trixie Dredge, am acting as transcriptionist for Coralie Common, MD.  This is the patient's first visit at [facility]. The patient's NEW PATIENT PACKET was reviewed at length. Included in the packet: current and past health history, medications, allergies, ROS, gynecologic history (women  only), surgical history, family history, social history, weight history, weight loss surgery history (for those that have had weight loss surgery), nutritional evaluation, mood and food questionnaire, PHQ9, Epworth questionnaire, sleep habits questionnaire, patient life and health improvement goals questionnaire. These will all be scanned into the patient's chart under media.   During the visit, I independently reviewed the patient's EKG, bioimpedance scale results, and indirect calorimeter results. I used this information to tailor a meal plan for the patient that will help him to lose weight and will improve his obesity-related conditions going forward. I performed a medically necessary appropriate examination and/or evaluation. I discussed the assessment and treatment plan with the patient. The patient was provided an opportunity to ask questions and all were answered.  The patient agreed with the plan and demonstrated an understanding of the instructions. Labs were ordered at this visit and will be reviewed at the next visit unless more critical results need to be addressed immediately. Clinical information was updated and documented in the EMR.   Time spent on visit including pre-visit chart review and post-visit care was 45 minutes.   A separate 15 minutes was spent on risk counseling (see above).

## 2020-02-22 ENCOUNTER — Encounter (INDEPENDENT_AMBULATORY_CARE_PROVIDER_SITE_OTHER): Payer: Self-pay | Admitting: Family Medicine

## 2020-02-22 ENCOUNTER — Ambulatory Visit (INDEPENDENT_AMBULATORY_CARE_PROVIDER_SITE_OTHER): Payer: PPO | Admitting: Family Medicine

## 2020-02-22 ENCOUNTER — Other Ambulatory Visit: Payer: Self-pay

## 2020-02-22 VITALS — BP 131/73 | HR 60 | Temp 98.2°F | Ht 64.0 in | Wt 328.0 lb

## 2020-02-22 DIAGNOSIS — E559 Vitamin D deficiency, unspecified: Secondary | ICD-10-CM

## 2020-02-22 DIAGNOSIS — E7849 Other hyperlipidemia: Secondary | ICD-10-CM

## 2020-02-22 DIAGNOSIS — R7303 Prediabetes: Secondary | ICD-10-CM

## 2020-02-22 DIAGNOSIS — Z6841 Body Mass Index (BMI) 40.0 and over, adult: Secondary | ICD-10-CM | POA: Diagnosis not present

## 2020-02-22 DIAGNOSIS — F418 Other specified anxiety disorders: Secondary | ICD-10-CM | POA: Diagnosis not present

## 2020-02-22 MED ORDER — VITAMIN D (ERGOCALCIFEROL) 1.25 MG (50000 UNIT) PO CAPS: 50000.0000 [IU] | ORAL_CAPSULE | ORAL | 0 refills | Status: DC

## 2020-02-25 NOTE — Progress Notes (Signed)
Chief Complaint:   OBESITY James [name] is here to discuss his progress with his obesity treatment plan along with follow-up of his obesity related diagnoses. James [name] is on the Category 4 Plan and states he is following his eating plan approximately 99% of the time. James [name] states he is exercising for 0 minutes 0 times per week.  Today's visit was #: 2 Starting weight: 332 lbs Starting date: 02/08/2020 Today's weight: 328 lbs Today's date: 02/22/2020 Total lbs lost to date: 4 lbs Total lbs lost since last in-office visit: 4 lbs  Interim History: James [name] states that Category 4 was relatively easy to follow.  He has questions about rice, quinoa, pumpkin seeds, sunflower seeds, and how to incorporate them.  Doing Yasso bars or sugar-free pudding for snacks.  Breaking up meals to get all food in.  He is starting therapy next week.  Did not order out at all.  Denies hunger.  Subjective:   1. Vitamin D deficiency James [name]'s Vitamin D level was 28.1 on 02/08/2020. He is currently taking no vitamin D supplement. He denies nausea, vomiting or muscle weakness.  He endorses fatigue.  2. Prediabetes James [name] has a diagnosis of prediabetes based on his elevated HgA1c and was informed this puts him at greater risk of developing diabetes. He continues to work on diet and exercise to decrease his risk of diabetes. He denies nausea or hypoglycemia.  Minimal carb cravings.  Previously on metformin.  He is not on medication at this time.  He has been prediabetic on and off for years.  Lab Results  Component Value Date   HGBA1C 5.7 (H) 01/19/2020   Lab Results  Component Value Date   INSULIN 23.5 02/08/2020   INSULIN 13.4 04/10/2017   INSULIN 25.3 (H) 01/01/2017   INSULIN 17.2 09/04/2016   3. Other hyperlipidemia James [name] has hyperlipidemia and has been trying to improve his cholesterol levels with intensive lifestyle modification including a low saturated fat diet, exercise and weight loss. He denies any chest pain,  claudication or myalgias.  He is not on a statin.  Cardiac stress test on 11/03/2019 was a low risk study with 10 year ASCVD risk of 15.4% - moderate.  Statin recommended.  Lab Results  Component Value Date   ALT 24 02/08/2020   AST 19 02/08/2020   ALKPHOS 71 02/08/2020   BILITOT 0.3 02/08/2020   Lab Results  Component Value Date   CHOL 186 01/19/2020   HDL 52 01/19/2020   LDLCALC 105 (H) 01/19/2020   TRIG 171 (H) 01/19/2020   CHOLHDL 3.6 01/19/2020   4. Depression with anxiety He is taking Zoloft 25 mg daily.  Denies SI/HI.  Minimal improvement in mood.  Assessment/Plan:   1. Vitamin D deficiency Low Vitamin D level contributes to fatigue and are associated with obesity, breast, and colon cancer. He agrees to start to take prescription Vitamin D @50 ,000 IU every week and will follow-up for routine testing of Vitamin D, at least 2-3 times per year to avoid over-replacement.  -Start Vitamin D, Ergocalciferol, (DRISDOL) 1.25 MG (50000 UNIT) CAPS capsule; Take 1 capsule (50,000 Units total) by mouth every 7 (seven) days.  Dispense: 4 capsule; Refill: 0  2. Prediabetes James [name] will continue to work on weight loss, exercise, and decreasing simple carbohydrates to help decrease the risk of diabetes.  Follow-up labs in 3 months.  No change in Category 4.  3. Other hyperlipidemia Cardiovascular risk and specific lipid/LDL goals reviewed.  We discussed several lifestyle modifications today  and James [name] will continue to work on diet, exercise and weight loss efforts. Orders and follow up as documented in patient record.  Follow-up labs at next appointment.  No medication at this time.  Counseling Intensive lifestyle modifications are the first line treatment for this issue. . Dietary changes: Increase soluble fiber. Decrease simple carbohydrates. . Exercise changes: Moderate to vigorous-intensity aerobic activity 150 minutes per week if tolerated. . Lipid-lowering medications: see documented in  medical record.  4. Depression with anxiety Behavior modification techniques were discussed today to help James [name] deal with his emotional/non-hunger eating behaviors.  Orders and follow up as documented in patient record.  Follow-up with him at next appointment.   5. Class 3 severe obesity with serious comorbidity and body mass index (BMI) of 50.0 to 59.9 in adult, unspecified obesity type James [name])  James [name] is currently in the action stage of change. As such, his goal is to continue with weight loss efforts. He has agreed to the Category 4 Plan.   Exercise goals: No exercise has been prescribed at this time.  Behavioral modification strategies: increasing lean protein intake, meal planning and cooking strategies, keeping healthy foods in the home and planning for success.  James [name] has agreed to follow-up with our clinic in 2 weeks. He was informed of the importance of frequent follow-up visits to maximize his success with intensive lifestyle modifications for his multiple health conditions.   Objective:   Blood pressure 131/73, pulse 60, temperature 98.2 F (36.8 C), temperature source Oral, height 5\' 4"  (1.626 m), weight (!) 328 lb (148.8 kg), SpO2 95 %. Body mass index is 56.3 kg/m.  General: Cooperative, alert, well developed, in no acute distress. HEENT: Conjunctivae and lids unremarkable. Cardiovascular: Regular rhythm.  Lungs: Normal work of breathing. Neurologic: No focal deficits.   Lab Results  Component Value Date   CREATININE 0.96 02/08/2020   BUN 16 02/08/2020   NA 140 02/08/2020   K 4.5 02/08/2020   CL 99 02/08/2020   CO2 26 02/08/2020   Lab Results  Component Value Date   ALT 24 02/08/2020   AST 19 02/08/2020   ALKPHOS 71 02/08/2020   BILITOT 0.3 02/08/2020   Lab Results  Component Value Date   HGBA1C 5.7 (H) 01/19/2020   HGBA1C 5.8 10/15/2019   HGBA1C 5.8 01/13/2019   HGBA1C 5.6 10/23/2017   HGBA1C 5.4 04/10/2017   Lab Results  Component Value Date    INSULIN 23.5 02/08/2020   INSULIN 13.4 04/10/2017   INSULIN 25.3 (H) 01/01/2017   INSULIN 17.2 09/04/2016   Lab Results  Component Value Date   TSH 2.34 01/19/2020   Lab Results  Component Value Date   CHOL 186 01/19/2020   HDL 52 01/19/2020   LDLCALC 105 (H) 01/19/2020   TRIG 171 (H) 01/19/2020   CHOLHDL 3.6 01/19/2020   Lab Results  Component Value Date   WBC 7.1 01/19/2020   HGB 14.5 01/19/2020   HCT 44.1 01/19/2020   MCV 89.3 01/19/2020   PLT 208 01/19/2020   Obesity Behavioral Intervention:   Approximately 15 minutes were spent on the discussion below.  ASK: We discussed the diagnosis of obesity with James [name] today and James [name] agreed to give [name] permission to discuss obesity behavioral modification therapy today.  ASSESS: James [name] has the diagnosis of obesity and his BMI today is 56.3. James [name] is in the action stage of change.   ADVISE: James [name] was educated on the multiple health risks of obesity as well as the benefit of weight  loss to improve his health. He was advised of the need for long term treatment and the importance of lifestyle modifications to improve his current health and to decrease his risk of future health problems.  AGREE: Multiple dietary modification options and treatment options were discussed and James [name] agreed to follow the recommendations documented in the above note.  ARRANGE: James [name] was educated on the importance of frequent visits to treat obesity as outlined per CMS and USPSTF guidelines and agreed to schedule his next follow up appointment today.  Attestation Statements:   Reviewed by clinician on day of visit: allergies, medications, problem list, medical history, surgical history, family history, social history, and previous encounter notes.  I, Water quality scientist, CMA, am acting as transcriptionist for Coralie Common, MD.  I have reviewed the above documentation for accuracy and completeness, and I agree with the above. - Jinny Blossom,  MD

## 2020-02-28 ENCOUNTER — Other Ambulatory Visit (INDEPENDENT_AMBULATORY_CARE_PROVIDER_SITE_OTHER): Payer: Self-pay | Admitting: Family Medicine

## 2020-02-28 DIAGNOSIS — F3289 Other specified depressive episodes: Secondary | ICD-10-CM

## 2020-02-29 ENCOUNTER — Encounter (INDEPENDENT_AMBULATORY_CARE_PROVIDER_SITE_OTHER): Payer: Self-pay

## 2020-02-29 NOTE — Telephone Encounter (Signed)
Refill form filled out and given to MD. Pt indicates that he will be out before next visit.

## 2020-02-29 NOTE — Telephone Encounter (Signed)
My chart message sent to pt.

## 2020-03-01 ENCOUNTER — Ambulatory Visit (INDEPENDENT_AMBULATORY_CARE_PROVIDER_SITE_OTHER): Payer: PPO | Admitting: Psychologist

## 2020-03-01 DIAGNOSIS — F321 Major depressive disorder, single episode, moderate: Secondary | ICD-10-CM

## 2020-03-08 ENCOUNTER — Encounter (INDEPENDENT_AMBULATORY_CARE_PROVIDER_SITE_OTHER): Payer: Self-pay | Admitting: Family Medicine

## 2020-03-08 ENCOUNTER — Other Ambulatory Visit: Payer: Self-pay

## 2020-03-08 ENCOUNTER — Ambulatory Visit (INDEPENDENT_AMBULATORY_CARE_PROVIDER_SITE_OTHER): Payer: PPO | Admitting: Family Medicine

## 2020-03-08 VITALS — BP 146/80 | HR 71 | Temp 98.0°F | Ht 64.0 in | Wt 322.0 lb

## 2020-03-08 DIAGNOSIS — F3289 Other specified depressive episodes: Secondary | ICD-10-CM

## 2020-03-08 DIAGNOSIS — Z6841 Body Mass Index (BMI) 40.0 and over, adult: Secondary | ICD-10-CM | POA: Diagnosis not present

## 2020-03-08 DIAGNOSIS — E559 Vitamin D deficiency, unspecified: Secondary | ICD-10-CM | POA: Diagnosis not present

## 2020-03-08 DIAGNOSIS — R7303 Prediabetes: Secondary | ICD-10-CM

## 2020-03-08 MED ORDER — VITAMIN D (ERGOCALCIFEROL) 1.25 MG (50000 UNIT) PO CAPS: 50000.0000 [IU] | ORAL_CAPSULE | ORAL | 0 refills | Status: DC

## 2020-03-08 MED ORDER — SERTRALINE HCL 25 MG PO TABS: 25.0000 mg | ORAL_TABLET | Freq: Every day | ORAL | 0 refills | Status: DC

## 2020-03-08 NOTE — Progress Notes (Signed)
Chief Complaint:   OBESITY James [name] is here to discuss his progress with his obesity treatment plan along with follow-up of his obesity related diagnoses. James [name] is on the Category 4 Plan and states he is following his eating plan approximately 99% of the time. James [name] states he is walking for 15 minutes 2 times per week.  Today's visit was #: 3 Starting weight: 332 lbs Starting date: 02/08/2020 Today's weight: 322 lbs Today's date: 03/08/2020 Total lbs lost to date: 10 Total lbs lost since last in-office visit: 6  Interim History: James [name] has been following plan as strictly as he can. He denies hunger. Biggest obstacle is trying to incorporate some physical activity into his routine. He has restarted hip rehab. He is prepping for Thanksgiving in upcoming few weeks.  Subjective:   1. Vitamin D deficiency James [name] denies nausea, vomiting, or muscle weakness, but notes fatigue. He is on prescription Vit D.  2. Pre-diabetes James [name]'s last A1c was 5.7 and insulin 23.5. He is not on metformin.  3. Other depression, with emotional eating James [name] denies suicidal or homicidal ideas. He is on Zoloft with improvement in symptoms. His second therapy appointment is tomorrow.  Assessment/Plan:   1. Vitamin D deficiency Low Vitamin D level contributes to fatigue and are associated with obesity, breast, and colon cancer. We will refill prescription Vitamin D for 1 month. Brittney will follow-up for routine testing of Vitamin D, at least 2-3 times per year to avoid over-replacement.  - Vitamin D, Ergocalciferol, (DRISDOL) 1.25 MG (50000 UNIT) CAPS capsule; Take 1 capsule (50,000 Units total) by mouth every 7 (seven) days.  Dispense: 4 capsule; Refill: 0  2. Pre-diabetes James [name] will continue Category 4, and will continue to work on weight loss, exercise, and decreasing simple carbohydrates to help decrease the risk of diabetes. We will repeat labs in 2 months.  3. Other depression, with emotional  eating Behavior modification techniques were discussed today to help James [name] deal with his emotional/non-hunger eating behaviors. We will refill Zoloft for 1 month. Orders and follow up as documented in patient record.   - sertraline (ZOLOFT) 25 MG tablet; Take 1 tablet (25 mg total) by mouth daily.  Dispense: 30 tablet; Refill: 0  4. Class 3 severe obesity with serious comorbidity and body mass index (BMI) of 50.0 to 59.9 in adult, unspecified obesity type James [name]) James [name] is currently in the action stage of change. As such, his goal is to continue with weight loss efforts. He has agreed to the Category 4 Plan.   Exercise goals: As is.  Behavioral modification strategies: increasing lean protein intake, meal planning and cooking strategies, keeping healthy foods in the home and planning for success.  James [name] has agreed to follow-up with our clinic in 4 weeks. He was informed of the importance of frequent follow-up visits to maximize his success with intensive lifestyle modifications for his multiple health conditions.   Objective:   Blood pressure (!) 146/80, pulse 71, temperature 98 F (36.7 C), temperature source Oral, height 5\' 4"  (1.626 m), weight (!) 322 lb (146.1 kg), SpO2 98 %. Body mass index is 55.27 kg/m.  General: Cooperative, alert, well developed, in no acute distress. HEENT: Conjunctivae and lids unremarkable. Cardiovascular: Regular rhythm.  Lungs: Normal work of breathing. Neurologic: No focal deficits.   Lab Results  Component Value Date   CREATININE 0.96 02/08/2020   BUN 16 02/08/2020   NA 140 02/08/2020   K 4.5 02/08/2020   CL 99 02/08/2020   CO2  26 02/08/2020   Lab Results  Component Value Date   ALT 24 02/08/2020   AST 19 02/08/2020   ALKPHOS 71 02/08/2020   BILITOT 0.3 02/08/2020   Lab Results  Component Value Date   HGBA1C 5.7 (H) 01/19/2020   HGBA1C 5.8 10/15/2019   HGBA1C 5.8 01/13/2019   HGBA1C 5.6 10/23/2017   HGBA1C 5.4 04/10/2017   Lab Results   Component Value Date   INSULIN 23.5 02/08/2020   INSULIN 13.4 04/10/2017   INSULIN 25.3 (H) 01/01/2017   INSULIN 17.2 09/04/2016   Lab Results  Component Value Date   TSH 2.34 01/19/2020   Lab Results  Component Value Date   CHOL 186 01/19/2020   HDL 52 01/19/2020   LDLCALC 105 (H) 01/19/2020   TRIG 171 (H) 01/19/2020   CHOLHDL 3.6 01/19/2020   Lab Results  Component Value Date   WBC 7.1 01/19/2020   HGB 14.5 01/19/2020   HCT 44.1 01/19/2020   MCV 89.3 01/19/2020   PLT 208 01/19/2020   No results found for: IRON, TIBC, FERRITIN  Obesity Behavioral Intervention:   Approximately 15 minutes were spent on the discussion below.  ASK: We discussed the diagnosis of obesity with James [name] today and James [name] agreed to give [name] permission to discuss obesity behavioral modification therapy today.  ASSESS: James [name] has the diagnosis of obesity and his BMI today is 55.24. James [name] is in the action stage of change.   ADVISE: James [name] was educated on the multiple health risks of obesity as well as the benefit of weight loss to improve his health. He was advised of the need for long term treatment and the importance of lifestyle modifications to improve his current health and to decrease his risk of future health problems.  AGREE: Multiple dietary modification options and treatment options were discussed and Nohlan agreed to follow the recommendations documented in the above note.  ARRANGE: James [name] was educated on the importance of frequent visits to treat obesity as outlined per CMS and USPSTF guidelines and agreed to schedule his next follow up appointment today.  Attestation Statements:   Reviewed by clinician on day of visit: allergies, medications, problem list, medical history, surgical history, family history, social history, and previous encounter notes.   I, James [name], am acting as transcriptionist for James Common, MD.  I have reviewed the above documentation for accuracy and  completeness, and I agree with the above. - Jinny Blossom, MD

## 2020-03-09 ENCOUNTER — Ambulatory Visit (INDEPENDENT_AMBULATORY_CARE_PROVIDER_SITE_OTHER): Payer: PPO | Admitting: Psychologist

## 2020-03-09 DIAGNOSIS — F321 Major depressive disorder, single episode, moderate: Secondary | ICD-10-CM | POA: Diagnosis not present

## 2020-03-15 ENCOUNTER — Ambulatory Visit (INDEPENDENT_AMBULATORY_CARE_PROVIDER_SITE_OTHER): Payer: PPO | Admitting: Family Medicine

## 2020-03-19 ENCOUNTER — Encounter (INDEPENDENT_AMBULATORY_CARE_PROVIDER_SITE_OTHER): Payer: Self-pay | Admitting: Family Medicine

## 2020-03-19 DIAGNOSIS — F3289 Other specified depressive episodes: Secondary | ICD-10-CM

## 2020-03-21 ENCOUNTER — Ambulatory Visit (INDEPENDENT_AMBULATORY_CARE_PROVIDER_SITE_OTHER): Payer: PPO | Admitting: Psychologist

## 2020-03-21 DIAGNOSIS — F321 Major depressive disorder, single episode, moderate: Secondary | ICD-10-CM | POA: Diagnosis not present

## 2020-03-21 MED ORDER — SERTRALINE HCL 50 MG PO TABS: 50.0000 mg | ORAL_TABLET | Freq: Every day | ORAL | 0 refills | Status: DC

## 2020-03-21 NOTE — Telephone Encounter (Signed)
Last OV with Dr Ukleja 

## 2020-03-21 NOTE — Telephone Encounter (Signed)
Please advise 

## 2020-04-06 ENCOUNTER — Encounter (INDEPENDENT_AMBULATORY_CARE_PROVIDER_SITE_OTHER): Payer: Self-pay | Admitting: Family Medicine

## 2020-04-06 ENCOUNTER — Other Ambulatory Visit: Payer: Self-pay

## 2020-04-06 ENCOUNTER — Ambulatory Visit (INDEPENDENT_AMBULATORY_CARE_PROVIDER_SITE_OTHER): Payer: PPO | Admitting: Family Medicine

## 2020-04-06 ENCOUNTER — Ambulatory Visit: Payer: PPO

## 2020-04-06 VITALS — BP 152/86 | HR 74 | Temp 98.0°F | Ht 64.0 in | Wt 317.0 lb

## 2020-04-06 DIAGNOSIS — F32A Depression, unspecified: Secondary | ICD-10-CM | POA: Diagnosis not present

## 2020-04-06 DIAGNOSIS — I10 Essential (primary) hypertension: Secondary | ICD-10-CM | POA: Diagnosis not present

## 2020-04-06 DIAGNOSIS — F419 Anxiety disorder, unspecified: Secondary | ICD-10-CM | POA: Diagnosis not present

## 2020-04-06 DIAGNOSIS — Z6841 Body Mass Index (BMI) 40.0 and over, adult: Secondary | ICD-10-CM

## 2020-04-06 DIAGNOSIS — E559 Vitamin D deficiency, unspecified: Secondary | ICD-10-CM | POA: Diagnosis not present

## 2020-04-06 MED ORDER — VITAMIN D (ERGOCALCIFEROL) 1.25 MG (50000 UNIT) PO CAPS: 50000.0000 [IU] | ORAL_CAPSULE | ORAL | 0 refills | Status: DC

## 2020-04-06 MED ORDER — SERTRALINE HCL 50 MG PO TABS: 50.0000 mg | ORAL_TABLET | Freq: Every day | ORAL | 0 refills | Status: DC

## 2020-04-06 NOTE — Progress Notes (Signed)
Chief Complaint:   OBESITY James [name] is here to discuss his progress with his obesity treatment plan along with follow-up of his obesity related diagnoses. James [name] is on the Category 4 Plan and states he is following his eating plan approximately 90% of the time. James [name] states he is chasing his grandchildren.  Today's visit was #: 4 Starting weight: 332 lbs Starting date: 02/08/2020 Today's weight: 317 lbs Today's date: 04/06/2020 Total lbs lost to date: 15 lbs Total lbs lost since last in-office visit: 5 lbs  Interim History: James [name] says he had a relaxing and family filled Thanksgiving.  He ate nontraditional food on Thanksgiving Day itself.  He says he did a bit of indulgent eating with pumpkin pie and chips and salsa.  He says he would like to stay on Category 4.  He thinks his biggest obstacle is grocery shopping to limit indulgent foods on hand.  Subjective:   1. Essential hypertension Blood pressure slightly elevated today.  No chest pain, chest pressure, or headache.  He is taking lisinopril 20 mg daily.  BP Readings from Last 3 Encounters:  04/06/20 (!) 152/86  03/08/20 (!) 146/80  02/22/20 131/73   2. Vitamin D deficiency James [name]'s Vitamin D level was 28.1 on 02/08/2020. He is currently taking prescription vitamin D 50,000 IU each week. He denies nausea, vomiting or muscle weakness.  He endorses fatigue.  3. Anxiety and depression Symptoms have improved on sertraline.  Denies SI/HI.  Assessment/Plan:   1. Essential hypertension Continue current medication with no change in dose.  Follow-up blood pressure at next appointment.  2. Vitamin D deficiency Low Vitamin D level contributes to fatigue and are associated with obesity, breast, and colon cancer. He agrees to continue to take prescription Vitamin D @50 ,000 IU every week and will follow-up for routine testing of Vitamin D, at least 2-3 times per year to avoid over-replacement.  -Refill Vitamin D, Ergocalciferol, (DRISDOL)  1.25 MG (50000 UNIT) CAPS capsule; Take 1 capsule (50,000 Units total) by mouth every 7 (seven) days.  Dispense: 4 capsule; Refill: 0  3. Anxiety and depression Will refill sertraline, as per below.  -Refill sertraline (ZOLOFT) 50 MG tablet; Take 1 tablet (50 mg total) by mouth daily.  Dispense: 30 tablet; Refill: 0  4. Class 3 severe obesity with serious comorbidity and body mass index (BMI) of 50.0 to 59.9 in adult, unspecified obesity type James [name])  James [name] is currently in the action stage of change. As such, his goal is to continue with weight loss efforts. He has agreed to the Category 4 Plan.   Exercise goals: All adults should avoid inactivity. Some physical activity is better than none, and adults who participate in any amount of physical activity gain some health benefits.  Behavioral modification strategies: increasing lean protein intake, meal planning and cooking strategies, keeping healthy foods in the home, holiday eating strategies  and planning for success.  James [name] has agreed to follow-up with our clinic in 3 weeks. He was informed of the importance of frequent follow-up visits to maximize his success with intensive lifestyle modifications for his multiple health conditions.   Objective:   Blood pressure (!) 152/86, pulse 74, temperature 98 F (36.7 C), temperature source Oral, height 5\' 4"  (1.626 m), weight (!) 317 lb (143.8 kg), SpO2 95 %. Body mass index is 54.41 kg/m.  General: Cooperative, alert, well developed, in no acute distress. HEENT: Conjunctivae and lids unremarkable. Cardiovascular: Regular rhythm.  Lungs: Normal work of breathing. Neurologic: No focal  deficits.   Lab Results  Component Value Date   CREATININE 0.96 02/08/2020   BUN 16 02/08/2020   NA 140 02/08/2020   K 4.5 02/08/2020   CL 99 02/08/2020   CO2 26 02/08/2020   Lab Results  Component Value Date   ALT 24 02/08/2020   AST 19 02/08/2020   ALKPHOS 71 02/08/2020   BILITOT 0.3 02/08/2020    Lab Results  Component Value Date   HGBA1C 5.7 (H) 01/19/2020   HGBA1C 5.8 10/15/2019   HGBA1C 5.8 01/13/2019   HGBA1C 5.6 10/23/2017   HGBA1C 5.4 04/10/2017   Lab Results  Component Value Date   INSULIN 23.5 02/08/2020   INSULIN 13.4 04/10/2017   INSULIN 25.3 (H) 01/01/2017   INSULIN 17.2 09/04/2016   Lab Results  Component Value Date   TSH 2.34 01/19/2020   Lab Results  Component Value Date   CHOL 186 01/19/2020   HDL 52 01/19/2020   LDLCALC 105 (H) 01/19/2020   TRIG 171 (H) 01/19/2020   CHOLHDL 3.6 01/19/2020   Lab Results  Component Value Date   WBC 7.1 01/19/2020   HGB 14.5 01/19/2020   HCT 44.1 01/19/2020   MCV 89.3 01/19/2020   PLT 208 01/19/2020   Obesity Behavioral Intervention:   Approximately 15 minutes were spent on the discussion below.  ASK: We discussed the diagnosis of obesity with James [name] today and James [name] agreed to give [name] permission to discuss obesity behavioral modification therapy today.  ASSESS: James [name] has the diagnosis of obesity and his BMI today is 54.5. James [name] is in the action stage of change.   ADVISE: James [name] was educated on the multiple health risks of obesity as well as the benefit of weight loss to improve his health. He was advised of the need for long term treatment and the importance of lifestyle modifications to improve his current health and to decrease his risk of future health problems.  AGREE: Multiple dietary modification options and treatment options were discussed and James [name] agreed to follow the recommendations documented in the above note.  ARRANGE: James [name] was educated on the importance of frequent visits to treat obesity as outlined per CMS and USPSTF guidelines and agreed to schedule his next follow up appointment today.  Attestation Statements:   Reviewed by clinician on day of visit: allergies, medications, problem list, medical history, surgical history, family history, social history, and previous encounter  notes.  I, Water quality scientist, CMA, am acting as transcriptionist for Coralie Common, MD.  I have reviewed the above documentation for accuracy and completeness, and I agree with the above. - Jinny Blossom, MD

## 2020-04-25 ENCOUNTER — Ambulatory Visit (INDEPENDENT_AMBULATORY_CARE_PROVIDER_SITE_OTHER): Payer: PPO | Admitting: Psychologist

## 2020-04-25 DIAGNOSIS — F321 Major depressive disorder, single episode, moderate: Secondary | ICD-10-CM

## 2020-04-28 ENCOUNTER — Other Ambulatory Visit: Payer: Self-pay | Admitting: Adult Health

## 2020-04-28 DIAGNOSIS — I10 Essential (primary) hypertension: Secondary | ICD-10-CM

## 2020-05-11 ENCOUNTER — Encounter (INDEPENDENT_AMBULATORY_CARE_PROVIDER_SITE_OTHER): Payer: Self-pay | Admitting: Family Medicine

## 2020-05-11 ENCOUNTER — Other Ambulatory Visit: Payer: Self-pay

## 2020-05-11 ENCOUNTER — Ambulatory Visit (INDEPENDENT_AMBULATORY_CARE_PROVIDER_SITE_OTHER): Payer: PPO | Admitting: Family Medicine

## 2020-05-11 VITALS — BP 128/75 | HR 73 | Temp 97.7°F | Ht 64.0 in | Wt 314.0 lb

## 2020-05-11 DIAGNOSIS — Z6841 Body Mass Index (BMI) 40.0 and over, adult: Secondary | ICD-10-CM

## 2020-05-11 DIAGNOSIS — E559 Vitamin D deficiency, unspecified: Secondary | ICD-10-CM | POA: Diagnosis not present

## 2020-05-11 DIAGNOSIS — F32A Depression, unspecified: Secondary | ICD-10-CM | POA: Diagnosis not present

## 2020-05-11 DIAGNOSIS — F419 Anxiety disorder, unspecified: Secondary | ICD-10-CM

## 2020-05-11 MED ORDER — VITAMIN D (ERGOCALCIFEROL) 1.25 MG (50000 UNIT) PO CAPS: 50000.0000 [IU] | ORAL_CAPSULE | ORAL | 0 refills | Status: DC

## 2020-05-11 MED ORDER — SERTRALINE HCL 50 MG PO TABS: 50.0000 mg | ORAL_TABLET | Freq: Every day | ORAL | 0 refills | Status: DC

## 2020-05-11 NOTE — Progress Notes (Signed)
Chief Complaint:   OBESITY Toua is here to discuss his progress with his obesity treatment plan along with follow-up of his obesity related diagnoses. Rivaan is on the Category 4 Plan and states he is following his eating plan approximately 80% of the time. Eddy states he is walking 30 minutes 3-4 times per week.  Today's visit was #: 5 Starting weight: 332 lbs Starting date: 02/08/2020 Today's weight: 314 lbs Today's date: 05/11/2020 Total lbs lost to date: 18 lbs Total lbs lost since last in-office visit: 3 lbs  Interim History: Deakon had a busy last 3 weeks due to family visiting. He did some indulgent eating over last few weeks. Taylin would like to commit to a more stringent exercise program. He wants to walk 30 min 2x a day. Arlie wants to really commit to Cat 4.  Subjective:   1. Vitamin D deficiency Kyrian's Vitamin D level was 28.1 on 02/08/2020. He denies nausea, vomiting, or muscle weakness. He endorses fatigue.   2. Anxiety and depression Dre is showing no sign of suicidal or homicidal ideation on Zoloft. His symptoms have improved on sertraline.    Assessment/Plan:   1. Vitamin D deficiency Low Vitamin D level contributes to fatigue and are associated with obesity, breast, and colon cancer. He agrees to continue to take prescription Vitamin D @50 ,000 IU every week and will follow-up for routine testing of Vitamin D, at least 2-3 times per year to avoid over-replacement. Will refill Vitamin D. - Vitamin D, Ergocalciferol, (DRISDOL) 1.25 MG (50000 UNIT) CAPS capsule; Take 1 capsule (50,000 Units total) by mouth every 7 (seven) days.  Dispense: 4 capsule; Refill: 0  2. Anxiety and depression Behavior modification techniques were discussed today to help Juanmanuel deal with his emotional/non-hunger eating behaviors.  Orders and follow up as documented in patient record. Will refill sertraline. - sertraline (ZOLOFT) 50 MG tablet; Take 1 tablet (50 mg total) by mouth daily.   Dispense: 30 tablet; Refill: 0  3. Class 3 severe obesity with serious comorbidity and body mass index (BMI) of 50.0 to 59.9 in adult, unspecified obesity type [facility])  Tavaughn is currently in the action stage of change. As such, his goal is to continue with weight loss efforts. He has agreed to the Category 4 Plan.   Exercise goals: Some exercise.  Behavioral modification strategies: increasing lean protein intake, meal planning and cooking strategies, keeping healthy foods in the home and planning for success.  Wilton has agreed to follow-up with our clinic in 2-3 weeks. He was informed of the importance of frequent follow-up visits to maximize his success with intensive lifestyle modifications for his multiple health conditions.    Objective:   Blood pressure 128/75, pulse 73, temperature 97.7 F (36.5 C), temperature source Oral, height 5\' 4"  (1.626 m), weight (!) 314 lb (142.4 kg), SpO2 97 %. Body mass index is 53.9 kg/m.  General: Cooperative, alert, well developed, in no acute distress. HEENT: Conjunctivae and lids unremarkable. Cardiovascular: Regular rhythm.  Lungs: Normal work of breathing. Neurologic: No focal deficits.   Lab Results  Component Value Date   CREATININE 0.96 02/08/2020   BUN 16 02/08/2020   NA 140 02/08/2020   K 4.5 02/08/2020   CL 99 02/08/2020   CO2 26 02/08/2020   Lab Results  Component Value Date   ALT 24 02/08/2020   AST 19 02/08/2020   ALKPHOS 71 02/08/2020   BILITOT 0.3 02/08/2020   Lab Results  Component Value Date  HGBA1C 5.7 (H) 01/19/2020   HGBA1C 5.8 10/15/2019   HGBA1C 5.8 01/13/2019   HGBA1C 5.6 10/23/2017   HGBA1C 5.4 04/10/2017   Lab Results  Component Value Date   INSULIN 23.5 02/08/2020   INSULIN 13.4 04/10/2017   INSULIN 25.3 (H) 01/01/2017   INSULIN 17.2 09/04/2016   Lab Results  Component Value Date   TSH 2.34 01/19/2020   Lab Results  Component Value Date   CHOL 186 01/19/2020   HDL 52 01/19/2020   LDLCALC  105 (H) 01/19/2020   TRIG 171 (H) 01/19/2020   CHOLHDL 3.6 01/19/2020   Lab Results  Component Value Date   WBC 7.1 01/19/2020   HGB 14.5 01/19/2020   HCT 44.1 01/19/2020   MCV 89.3 01/19/2020   PLT 208 01/19/2020   No results found for: IRON, TIBC, FERRITIN  Obesity Behavioral Intervention:   Approximately 15 minutes were spent on the discussion below.  ASK: We discussed the diagnosis of obesity with [name] today and [name] agreed to give [name] permission to discuss obesity behavioral modification therapy today.  ASSESS: Adnan has the diagnosis of obesity and his BMI today is 53.9. Zakhar is in the action stage of change.   ADVISE: Deddrick was educated on the multiple health risks of obesity as well as the benefit of weight loss to improve his health. He was advised of the need for long term treatment and the importance of lifestyle modifications to improve his current health and to decrease his risk of future health problems.  AGREE: Multiple dietary modification options and treatment options were discussed and Mato agreed to follow the recommendations documented in the above note.  ARRANGE: Snyder was educated on the importance of frequent visits to treat obesity as outlined per CMS and USPSTF guidelines and agreed to schedule his next follow up appointment today.  Attestation Statements:   Reviewed by clinician on day of visit: allergies, medications, problem list, medical history, surgical history, family history, social history, and previous encounter notes.   I, Para March, am acting as transcriptionist for Coralie Common, MD.  I have reviewed the above documentation for accuracy and completeness, and I agree with the above. - Jinny Blossom, MD

## 2020-05-12 ENCOUNTER — Telehealth: Payer: Self-pay | Admitting: Adult Health

## 2020-05-12 NOTE — Telephone Encounter (Signed)
Pt wanted to know if he needs to continue with  hydrochlorothiazide 25 mg daily He stated he is doing fine  Bp yesterday 125/76  He stated he stopped furosemide about 1 week he went back to htcz 1 week ago.  Is this ok please advise

## 2020-05-13 NOTE — Telephone Encounter (Signed)
Spoke to the pt and informed him of the below message.  He will continue the medication.  Nothing further needed.

## 2020-05-13 NOTE — Telephone Encounter (Signed)
If his blood pressures in the 120s over 70s with HCTZ then I would say to continue this medication along with the lisinopril

## 2020-05-24 ENCOUNTER — Other Ambulatory Visit: Payer: Self-pay

## 2020-05-24 ENCOUNTER — Telehealth (INDEPENDENT_AMBULATORY_CARE_PROVIDER_SITE_OTHER): Payer: Self-pay

## 2020-05-24 ENCOUNTER — Encounter (INDEPENDENT_AMBULATORY_CARE_PROVIDER_SITE_OTHER): Payer: Self-pay | Admitting: Family Medicine

## 2020-05-24 ENCOUNTER — Telehealth (INDEPENDENT_AMBULATORY_CARE_PROVIDER_SITE_OTHER): Payer: PPO | Admitting: Family Medicine

## 2020-05-24 DIAGNOSIS — R7303 Prediabetes: Secondary | ICD-10-CM

## 2020-05-24 DIAGNOSIS — E559 Vitamin D deficiency, unspecified: Secondary | ICD-10-CM

## 2020-05-24 DIAGNOSIS — Z6841 Body Mass Index (BMI) 40.0 and over, adult: Secondary | ICD-10-CM

## 2020-05-24 NOTE — Telephone Encounter (Signed)
I connected with  James [name] on 05/24/20 by a video enabled telemedicine application and verified that I am speaking with the correct person using two identifiers.   I discussed the limitations of evaluation and management by telemedicine. The patient expressed understanding and agreed to proceed.
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
## 2020-05-31 ENCOUNTER — Other Ambulatory Visit: Payer: Self-pay

## 2020-05-31 ENCOUNTER — Encounter: Payer: Self-pay | Admitting: Adult Health

## 2020-05-31 ENCOUNTER — Ambulatory Visit (INDEPENDENT_AMBULATORY_CARE_PROVIDER_SITE_OTHER): Payer: PPO

## 2020-05-31 DIAGNOSIS — Z0000 Encounter for general adult medical examination without abnormal findings: Secondary | ICD-10-CM

## 2020-05-31 NOTE — Progress Notes (Signed)
Subjective:   James [name] is a 65 y.o. male who presents for an Initial Medicare Annual Wellness Visit.  Virtual Visit via Video Note  I connected with James [name]  by a video enabled telemedicine application and verified that I am speaking with the correct person using two identifiers.  Location: Patient: Home Provider: Office Persons participating in the virtual visit: patient, provider   I discussed the limitations of evaluation and management by telemedicine and the availability of in person appointments. The patient expressed understanding and agreed to proceed.     Larene Beach Kineta Fudala,LPN   Review of Systems    N/A  Cardiac Risk Factors include: advanced age (>9men, >60 women);male gender;hypertension;dyslipidemia;obesity (BMI >30kg/m2)     Objective:    Today's Vitals   There is no height or weight on file to calculate BMI.  Advanced Directives 05/31/2020 12/14/2019 02/11/2019 02/04/2019 07/18/2016  Does Patient Have a Medical Advance Directive? Yes Yes Yes Yes Yes  Type of Paramedic of [place];Living will [place];Living will [place];Living will [place];Living will Living will;Healthcare Power of Attorney  Does patient want to make changes to medical advance directive? No - Patient declined - No - Guardian declined No - Guardian declined No - Patient declined  Copy of [place] in Chart? No - copy requested No - copy requested No - copy requested No - copy requested No - copy requested    Current Medications (verified) Outpatient Encounter Medications as of 05/31/2020  Medication Sig  . acetaminophen (TYLENOL) 500 MG tablet Take 1,000 mg by mouth at bedtime as needed for moderate pain.  . cetirizine (ZYRTEC) 10 MG tablet Take 10 mg by mouth daily.  . fluticasone (FLONASE) 50 MCG/ACT nasal spray Place 1 spray into both nostrils daily.   . hydrochlorothiazide  (HYDRODIURIL) 25 MG tablet Take 25 mg by mouth daily.  Marland Kitchen ibuprofen (ADVIL) 200 MG tablet Take 400 mg by mouth at bedtime as needed for moderate pain.   Marland Kitchen Ketotifen Fumarate (ALLERGY EYE DROPS OP) Place 1 drop into both eyes daily as needed (allergies).  Marland Kitchen lidocaine (LMX) 4 % cream Apply 1 application topically daily as needed (pain).  Marland Kitchen lisinopril (ZESTRIL) 10 MG tablet TAKE 2 TABLETS BY MOUTH EVERY DAY  . Multiple Vitamin (MULTIVITAMIN) tablet Take 1 tablet by mouth daily.  . sertraline (ZOLOFT) 50 MG tablet Take 1 tablet (50 mg total) by mouth daily.  . Vitamin D, Ergocalciferol, (DRISDOL) 1.25 MG (50000 UNIT) CAPS capsule Take 1 capsule (50,000 Units total) by mouth every 7 (seven) days.  Marland Kitchen amoxicillin (AMOXIL) 500 MG capsule Take 2,000 mg by mouth See admin instructions. Take 2000 mg 1 hour prior to dental work (Patient not taking: No sig reported)   No facility-administered encounter medications on file as of 05/31/2020.    Allergies (verified) Lipitor [atorvastatin]   History: Past Medical History:  Diagnosis Date  . Arthritis   . Bilateral swelling of feet   . Chicken pox   . Complication of anesthesia    difficulty waking up  . Depression   . Hypertension   . Joint pain   . Obesity   . Osteoarthritis   . Prediabetes   . Shortness of breath   . Swelling    feet and legs  . Vitamin D deficiency    Past Surgical History:  Procedure Laterality Date  . cartilage removal    . COLONOSCOPY WITH PROPOFOL N/A  12/14/2019   Procedure: COLONOSCOPY WITH PROPOFOL;  Surgeon: Doran Stabler, MD;  Location: [facility];  Service: Gastroenterology;  Laterality: N/A;  . KNEE SURGERY  2000  . POLYPECTOMY  12/14/2019   Procedure: POLYPECTOMY;  Surgeon: Doran Stabler, MD;  Location: [facility];  Service: Gastroenterology;;  . TOTAL HIP ARTHROPLASTY Left 02/11/2019   Procedure: TOTAL HIP [place];  Surgeon: Gaynelle Arabian, MD;  Location: [facility];  Service: Orthopedics;   Laterality: Left;  179min   Family History  Problem Relation Age of Onset  . Cancer Mother        lung and soft tissue cancer from radiation  . Stroke Mother   . Obesity Mother   . Heart disease Father        CHF  . Diabetes Father   . Hypertension Father   . Obesity Father   . Heart disease Maternal Grandfather   . Diabetes Paternal Grandmother   . Ovarian cancer Sister   . Colon cancer Neg Hx   . Colon polyps Neg Hx   . Esophageal cancer Neg Hx   . Rectal cancer Neg Hx   . Stomach cancer Neg Hx    Social History   Socioeconomic History  . Marital status: Married    Spouse name: Not on file  . Number of children: Not on file  . Years of education: Not on file  . Highest education level: Not on file  Occupational History  . Occupation: At home caregiver  Tobacco Use  . Smoking status: Former Smoker    Quit date: 07/16/1992    Years since quitting: 27.8  . Smokeless tobacco: Never Used  Substance and Sexual Activity  . Alcohol use: Yes    Comment: very little per patient   . Drug use: No  . Sexual activity: Not on file  Other Topics Concern  . 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 Difficulty of Paying Living Expenses: Not hard at all  Food Insecurity: No Food Insecurity  . Worried About Charity fundraiser in the Last Year: Never true  . Ran Out of Food in the Last Year: Never true  Transportation Needs: No Transportation Needs  . Lack of Transportation (Medical): No  . Lack of Transportation (Non-Medical): No  Physical Activity: Insufficiently Active  . Days of Exercise per Week: 3 days  . Minutes of Exercise per Session: 30 min  Stress: No  Stress Concern Present  . Feeling of Stress : Not at all  Social Connections: Socially Isolated  . Frequency of Communication with Friends and Family: Once a week  . Frequency of Social Gatherings with Friends and Family: Never  . Attends Religious Services: Never  . Active Member of Clubs or Organizations: No  . Attends Archivist Meetings: Never  . Marital Status: Married    Tobacco Counseling Counseling given: Not Answered   Clinical Intake:  Pre-visit preparation completed: Yes  Pain : No/denies pain     Nutritional Status: BMI > 30  Obese Nutritional Risks: None Diabetes: No  How often do you need to have someone help you when you read instructions, pamphlets, or other written materials from your doctor or pharmacy?: 1 - Never What is the last grade level you completed in school?: College  Diabetic?No   Interpreter Needed?: No  Information entered by :: [place] of Daily Living In your present state of health, do you have any difficulty performing the following activities: 05/31/2020  Hearing? N  Vision? N  Difficulty concentrating or making decisions? N  Walking or climbing stairs? Y  Dressing or bathing? N  Doing errands, shopping? N  Preparing Food and eating ? N  Using the Toilet? N  In the past six months, have you accidently leaked urine? Y  Do you have problems with loss of bowel control? N  Managing your Medications? N  Managing your Finances? N  Housekeeping or managing your Housekeeping? N  Some recent data might be hidden    Patient Care Team: Dorothyann Peng, NP as PCP - General (Family Medicine) Gaynelle Arabian, MD as Consulting Physician (Orthopedic Surgery)  Indicate any recent Medical Services you may have received from other than [facility] providers in the past year (date may be approximate).     Assessment:   This is a routine wellness examination for James [name].  Hearing/Vision screen  Hearing Screening   125Hz   250Hz  500Hz  1000Hz  2000Hz  3000Hz  4000Hz  6000Hz  8000Hz   Right ear:           Left ear:           Vision Screening Comments: Patient states gets eyes examined every 2-3 years    Dietary issues and exercise activities discussed: Current Exercise Habits: Home exercise routine, Type of exercise: walking, Time (Minutes): 30, Frequency (Times/Week): 3, Weekly Exercise (Minutes/Week): 90, Intensity: Mild  Goals    . Exercise 150 min/wk Moderate Activity      Depression Screen PHQ 2/9 Scores 05/31/2020 02/08/2020 09/04/2016 07/17/2016  PHQ - 2 Score 3 5 5 6   PHQ- 9 Score 13 19 14 23     Fall Risk Fall Risk  05/31/2020 04/01/2019  Falls in the past year? 0 0  Comment - Emmi Telephone Survey: data to providers prior to load  Number falls in past yr: 0 -  Injury with Fall? 0 -  Risk for fall due to : Impaired balance/gait -  Follow up Falls evaluation completed;Falls prevention discussed -    FALL RISK PREVENTION PERTAINING TO THE HOME:  Any stairs in or around the home? Yes  If so, are there any without handrails? No  Home free of loose throw rugs in walkways, pet beds, electrical cords, etc? Yes  Adequate lighting in your home to reduce risk of falls? Yes   ASSISTIVE DEVICES UTILIZED TO PREVENT FALLS:  Life alert? No  Use of a cane, walker or w/c? No  Grab bars in the bathroom? Yes  Shower chair or bench in shower? No  Elevated toilet seat or a handicapped toilet? No    Cognitive Function:   Normal cognitive status assessed by direct observation by this Nurse Health Advisor. No abnormalities found.        Immunizations Immunization History  Administered Date(s) Administered  . Fluad Quad(high Dose 65+) 01/13/2019, 01/19/2020  . Influenza Inj Mdck Quad Pf 01/27/2018  . Influenza,inj,Quad PF,6+ Mos 01/15/2013, 05/17/2015, 01/10/2016  . Influenza,inj,quad, With Preservative 01/27/2018  . Influenza-Unspecified 05/13/2014, 02/24/2017  . PFIZER(Purple Top)SARS-COV-2 Vaccination  06/12/2019, 07/08/2019, 02/16/2020  . Pneumococcal Conjugate-13 01/13/2019  . Pneumococcal  Polysaccharide-23 01/19/2020  . Td 05/07/2005  . Tdap 05/17/2015    TDAP status: Up to date  Flu Vaccine status: Up to date  Pneumococcal vaccine status: Up to date  Covid-19 vaccine status: Completed vaccines  Qualifies for Shingles Vaccine? Yes   Zostavax completed No   Shingrix Completed?: No.    Education has been provided regarding the importance of this vaccine. Patient has been advised to call insurance company to determine out of pocket expense if they have not yet received this vaccine. Advised may also receive vaccine at local pharmacy or Health Dept. Verbalized acceptance and understanding.  Screening Tests Health Maintenance  Topic Date Due  . Hepatitis C Screening  09/12/2025 (Originally August 13, 1955)  . COLON CANCER SCREENING ANNUAL FOBT  12/13/2020  . Fecal DNA (Cologuard)  10/15/2022  . TETANUS/TDAP  05/16/2025  . INFLUENZA VACCINE  Completed  . COVID-19 Vaccine  Completed  . PNA vac Low Risk Adult  Completed    Health Maintenance  There are no preventive care reminders to display for this patient.  Colorectal cancer screening: Type of screening: Cologuard. Completed 10/15/2019. Repeat every 3 years  Lung Cancer Screening: (Low Dose CT Chest recommended if Age 108-80 years, 30 pack-year currently smoking OR have quit w/in 15years.) does not qualify.   Lung Cancer Screening Referral: N/A   Additional Screening:  Hepatitis C Screening: does qualify;   Vision Screening: Recommended annual ophthalmology exams for early detection of glaucoma and other disorders of the eye. Is the patient up to date with their annual eye exam?  No  Who is the provider or what is the name of the office in which the patient attends annual eye exams? [facility]  If pt is not established with a provider, would they like to be referred to a provider to establish care? No .   Dental  Screening: Recommended annual dental exams for proper oral hygiene  Community Resource Referral / Chronic Care Management: CRR required this visit?  No   CCM required this visit?  No      Plan:     I have personally reviewed and noted the following in the patient's chart:   . Medical and social history . Use of alcohol, tobacco or illicit drugs  . Current medications and supplements . Functional ability and status . Nutritional status . Physical activity . Advanced directives . List of other physicians . Hospitalizations, surgeries, and ER visits in previous 12 months . Vitals . Screenings to include cognitive, depression, and falls . Referrals and appointments  In addition, I have reviewed and discussed with patient certain preventive protocols, quality metrics, and best practice recommendations. A written personalized care plan for preventive services as well as general preventive health recommendations were provided to patient.     Ofilia Neas, LPN   [id number]   Nurse Notes: None

## 2020-05-31 NOTE — Telephone Encounter (Signed)
Not sure how to respond.

## 2020-05-31 NOTE — Patient Instructions (Signed)
[name] , Thank you for taking time to come for your Medicare Wellness Visit. I appreciate your ongoing commitment to your health goals. Please review the following plan we discussed and let me know if I can assist you in the future.   Screening recommendations/referrals: Colonoscopy: Up to date, next due 12/14/2022 Recommended yearly ophthalmology/optometry visit for glaucoma screening and checkup Recommended yearly dental visit for hygiene and checkup  Vaccinations: Influenza vaccine: Up to date, next due fall 2022  Pneumococcal vaccine: Completed series  Tdap vaccine: up to date, next due 05/16/2025 Shingles vaccine: Currently due for Shingrix, if you wish to receive we recommend that you do so at your local pharmacy as it is less expensive     Advanced directives: Please bring in copies of your advanced directives into our office so that we may scan them into your chart.   Conditions/risks identified: None   Next appointment: None   Preventive Care 65 Years and Older, Male Preventive care refers to lifestyle choices and visits with your health care provider that can promote health and wellness. What does preventive care include?  A yearly physical exam. This is also called an annual well check.  Dental exams once or twice a year.  Routine eye exams. Ask your health care provider how often you should have your eyes checked.  Personal lifestyle choices, including:  Daily care of your teeth and gums.  Regular physical activity.  Eating a healthy diet.  Avoiding tobacco and drug use.  Limiting alcohol use.  Practicing safe sex.  Taking low doses of aspirin every day.  Taking vitamin and mineral supplements as recommended by your health care provider. What happens during an annual well check? The services and screenings done by your health care provider during your annual well check will depend on your age, overall health, lifestyle risk factors, and family history  of disease. Counseling  Your health care provider may ask you questions about your:  Alcohol use.  Tobacco use.  Drug use.  Emotional well-being.  Home and relationship well-being.  Sexual activity.  Eating habits.  History of falls.  Memory and ability to understand (cognition).  Work and work Statistician. Screening  You may have the following tests or measurements:  Height, weight, and BMI.  Blood pressure.  Lipid and cholesterol levels. These may be checked every 5 years, or more frequently if you are over 71 years old.  Skin check.  Lung cancer screening. You may have this screening every year starting at age 74 if you have a 30-pack-year history of smoking and currently smoke or have quit within the past 15 years.  Fecal occult blood test (FOBT) of the stool. You may have this test every year starting at age 42.  Flexible sigmoidoscopy or colonoscopy. You may have a sigmoidoscopy every 5 years or a colonoscopy every 10 years starting at age 42.  Prostate cancer screening. Recommendations will vary depending on your family history and other risks.  Hepatitis C blood test.  Hepatitis B blood test.  Sexually transmitted disease (STD) testing.  Diabetes screening. This is done by checking your blood sugar (glucose) after you have not eaten for a while (fasting). You may have this done every 1-3 years.  Abdominal aortic aneurysm (AAA) screening. You may need this if you are a current or former smoker.  Osteoporosis. You may be screened starting at age 83 if you are at high risk. Talk with your health care provider about your test results, treatment  options, and if necessary, the need for more tests. Vaccines  Your health care provider may recommend certain vaccines, such as:  Influenza vaccine. This is recommended every year.  Tetanus, diphtheria, and acellular pertussis (Tdap, Td) vaccine. You may need a Td booster every 10 years.  Zoster vaccine. You may  need this after age 48.  Pneumococcal 13-valent conjugate (PCV13) vaccine. One dose is recommended after age 54.  Pneumococcal polysaccharide (PPSV23) vaccine. One dose is recommended after age 24. Talk to your health care provider about which screenings and vaccines you need and how often you need them. This information is not intended to replace advice given to you by your health care provider. Make sure you discuss any questions you have with your health care provider. Document Released: 05/20/2015 Document Revised: 01/11/2016 Document Reviewed: 02/22/2015 Elsevier Interactive Patient Education  2017 [place] Prevention in the Home Falls can cause injuries. They can happen to people of all ages. There are many things you can do to make your home safe and to help prevent falls. What can I do on the outside of my home?  Regularly fix the edges of walkways and driveways and fix any cracks.  Remove anything that might make you trip as you walk through a door, such as a raised step or threshold.  Trim any bushes or trees on the path to your home.  Use bright outdoor lighting.  Clear any walking paths of anything that might make someone trip, such as rocks or tools.  Regularly check to see if handrails are loose or broken. Make sure that both sides of any steps have handrails.  Any raised decks and porches should have guardrails on the edges.  Have any leaves, snow, or ice cleared regularly.  Use sand or salt on walking paths during winter.  Clean up any spills in your garage right away. This includes oil or grease spills. What can I do in the bathroom?  Use night lights.  Install grab bars by the toilet and in the tub and shower. Do not use towel bars as grab bars.  Use non-skid mats or decals in the tub or shower.  If you need to sit down in the shower, use a plastic, non-slip stool.  Keep the floor dry. Clean up any water that spills on the floor as soon as it  happens.  Remove soap buildup in the tub or shower regularly.  Attach bath mats securely with double-sided non-slip rug tape.  Do not have throw rugs and other things on the floor that can make you trip. What can I do in the bedroom?  Use night lights.  Make sure that you have a light by your bed that is easy to reach.  Do not use any sheets or blankets that are too big for your bed. They should not hang down onto the floor.  Have a firm chair that has side arms. You can use this for support while you get dressed.  Do not have throw rugs and other things on the floor that can make you trip. What can I do in the kitchen?  Clean up any spills right away.  Avoid walking on wet floors.  Keep items that you use a lot in easy-to-reach places.  If you need to reach something above you, use a strong step stool that has a grab bar.  Keep electrical cords out of the way.  Do not use floor polish or wax that makes floors slippery.  If you must use wax, use non-skid floor wax.  Do not have throw rugs and other things on the floor that can make you trip. What can I do with my stairs?  Do not leave any items on the stairs.  Make sure that there are handrails on both sides of the stairs and use them. Fix handrails that are broken or loose. Make sure that handrails are as long as the stairways.  Check any carpeting to make sure that it is firmly attached to the stairs. Fix any carpet that is loose or worn.  Avoid having throw rugs at the top or bottom of the stairs. If you do have throw rugs, attach them to the floor with carpet tape.  Make sure that you have a light switch at the top of the stairs and the bottom of the stairs. If you do not have them, ask someone to add them for you. What else can I do to help prevent falls?  Wear shoes that:  Do not have high heels.  Have rubber bottoms.  Are comfortable and fit you well.  Are closed at the toe. Do not wear sandals.  If you  use a stepladder:  Make sure that it is fully opened. Do not climb a closed stepladder.  Make sure that both sides of the stepladder are locked into place.  Ask someone to hold it for you, if possible.  Clearly mark and make sure that you can see:  Any grab bars or handrails.  First and last steps.  Where the edge of each step is.  Use tools that help you move around (mobility aids) if they are needed. These include:  Canes.  Walkers.  Scooters.  Crutches.  Turn on the lights when you go into a dark area. Replace any light bulbs as soon as they burn out.  Set up your furniture so you have a clear path. Avoid moving your furniture around.  If any of your floors are uneven, fix them.  If there are any pets around you, be aware of where they are.  Review your medicines with your doctor. Some medicines can make you feel dizzy. This can increase your chance of falling. Ask your doctor what other things that you can do to help prevent falls. This information is not intended to replace advice given to you by your health care provider. Make sure you discuss any questions you have with your health care provider. Document Released: 02/17/2009 Document Revised: 09/29/2015 Document Reviewed: 05/28/2014 Elsevier Interactive Patient Education  2017 [employer].

## 2020-06-05 ENCOUNTER — Other Ambulatory Visit (INDEPENDENT_AMBULATORY_CARE_PROVIDER_SITE_OTHER): Payer: Self-pay | Admitting: Family Medicine

## 2020-06-05 DIAGNOSIS — F32A Depression, unspecified: Secondary | ICD-10-CM

## 2020-06-05 DIAGNOSIS — F419 Anxiety disorder, unspecified: Secondary | ICD-10-CM

## 2020-06-06 ENCOUNTER — Encounter (INDEPENDENT_AMBULATORY_CARE_PROVIDER_SITE_OTHER): Payer: Self-pay

## 2020-06-06 NOTE — Telephone Encounter (Signed)
Pt indicated through [employer] that he has over 2 weeks left of medication.

## 2020-06-06 NOTE — Telephone Encounter (Signed)
Message sent to pt-[facility] 

## 2020-06-09 ENCOUNTER — Encounter (INDEPENDENT_AMBULATORY_CARE_PROVIDER_SITE_OTHER): Payer: Self-pay | Admitting: Family Medicine

## 2020-06-09 ENCOUNTER — Telehealth (INDEPENDENT_AMBULATORY_CARE_PROVIDER_SITE_OTHER): Payer: PPO | Admitting: Family Medicine

## 2020-06-09 ENCOUNTER — Other Ambulatory Visit: Payer: Self-pay

## 2020-06-09 DIAGNOSIS — F32A Depression, unspecified: Secondary | ICD-10-CM

## 2020-06-09 DIAGNOSIS — E559 Vitamin D deficiency, unspecified: Secondary | ICD-10-CM

## 2020-06-09 DIAGNOSIS — F419 Anxiety disorder, unspecified: Secondary | ICD-10-CM

## 2020-06-09 DIAGNOSIS — Z6841 Body Mass Index (BMI) 40.0 and over, adult: Secondary | ICD-10-CM | POA: Diagnosis not present

## 2020-06-09 MED ORDER — VITAMIN D (ERGOCALCIFEROL) 1.25 MG (50000 UNIT) PO CAPS: 50000.0000 [IU] | ORAL_CAPSULE | ORAL | 0 refills | Status: DC

## 2020-06-13 NOTE — Progress Notes (Signed)
TeleHealth Visit:  Due to the COVID-19 pandemic, this visit was completed with telemedicine (audio/video) technology to reduce patient and provider exposure as well as to preserve personal protective equipment.   James [name] has verbally consented to this TeleHealth visit. The patient is located at home, the provider is located at the [facility] office. The participants in this visit include the listed provider and patient. The visit was conducted today via video.   Chief Complaint: OBESITY James [name] is here to discuss his progress with his obesity treatment plan along with follow-up of his obesity related diagnoses. James [name] is on the Category 4 Plan and states he is following his eating plan approximately 80% of the time. James [name] states he is doing very little exercise 0 minutes 0 times per week.  Today's visit was #: 7 Starting weight: 332 lbs Starting date: 02/08/2020  Interim History: Pt has been struggling the last few weeks. He is having issues with his wife and care giving for her. He has felt like he has been spiraling and fallen into snacking with crackers, chips, and inactivity.  Subjective:   1. Anxiety and depression Pt denies suicidal or homicidal ideations. He is on Zoloft 50 mg. Pt's symptoms have been reemerging in the past few weeks.  2. Vitamin D deficiency Pt denies nausea, vomiting, and muscle weakness but notes fatigue. Pt is on prescription Vit D.  Assessment/Plan:   1. Anxiety and depression Behavior modification techniques were discussed today to help James [name] deal with his anxiety.  Orders and follow up as documented in patient record. Continue sertraline 50 mg by mouth daily.  2. Vitamin D deficiency Low Vitamin D level contributes to fatigue and are associated with obesity, breast, and colon cancer. He agrees to continue to take prescription Vitamin D @50 ,000 IU every week and will follow-up for routine testing of Vitamin D, at least 2-3 times per year to  avoid over-replacement.  - Vitamin D, Ergocalciferol, (DRISDOL) 1.25 MG (50000 UNIT) CAPS capsule; Take 1 capsule (50,000 Units total) by mouth every 7 (seven) days.  Dispense: 4 capsule; Refill: 0  3. Class 3 severe obesity with serious comorbidity and body mass index (BMI) of 50.0 to 59.9 in adult, unspecified obesity type [facility]) James [name] is currently in the action stage of change. As such, his goal is to continue with weight loss efforts. He has agreed to the Category 4 Plan.   Exercise goals: As is  Behavioral modification strategies: increasing lean protein intake, meal planning and cooking strategies, keeping healthy foods in the home and planning for success.  James [name] has agreed to follow-up with our clinic in 2 weeks. He was informed of the importance of frequent follow-up visits to maximize his success with intensive lifestyle modifications for his multiple health conditions.  Objective:   VITALS: Per patient if applicable, see vitals. GENERAL: Alert and in no acute distress. CARDIOPULMONARY: No increased WOB. Speaking in clear sentences.  PSYCH: Pleasant and cooperative. Speech normal rate and rhythm. Affect is appropriate. Insight and judgement are appropriate. Attention is focused, linear, and appropriate.  NEURO: Oriented as arrived to appointment on time with no prompting.   Lab Results  Component Value Date   CREATININE 0.96 02/08/2020   BUN 16 02/08/2020   NA 140 02/08/2020   K 4.5 02/08/2020   CL 99 02/08/2020   CO2 26 02/08/2020   Lab Results  Component Value Date   ALT 24 02/08/2020   AST 19 02/08/2020   ALKPHOS 71 02/08/2020  BILITOT 0.3 02/08/2020   Lab Results  Component Value Date   HGBA1C 5.7 (H) 01/19/2020   HGBA1C 5.8 10/15/2019   HGBA1C 5.8 01/13/2019   HGBA1C 5.6 10/23/2017   HGBA1C 5.4 04/10/2017   Lab Results  Component Value Date   INSULIN 23.5 02/08/2020   INSULIN 13.4 04/10/2017   INSULIN 25.3 (H) 01/01/2017   INSULIN 17.2 09/04/2016    Lab Results  Component Value Date   TSH 2.34 01/19/2020   Lab Results  Component Value Date   CHOL 186 01/19/2020   HDL 52 01/19/2020   LDLCALC 105 (H) 01/19/2020   TRIG 171 (H) 01/19/2020   CHOLHDL 3.6 01/19/2020   Lab Results  Component Value Date   WBC 7.1 01/19/2020   HGB 14.5 01/19/2020   HCT 44.1 01/19/2020   MCV 89.3 01/19/2020   PLT 208 01/19/2020   No results found for: IRON, TIBC, FERRITIN  Attestation Statements:   Reviewed by clinician on day of visit: allergies, medications, problem list, medical history, surgical history, family history, social history, and previous encounter notes.  Coral Ceo, am acting as transcriptionist for Coralie Common, MD.   I have reviewed the above documentation for accuracy and completeness, and I agree with the above. - Jinny Blossom, MD

## 2020-06-23 ENCOUNTER — Ambulatory Visit (INDEPENDENT_AMBULATORY_CARE_PROVIDER_SITE_OTHER): Payer: PPO | Admitting: Family Medicine

## 2020-06-23 ENCOUNTER — Encounter (INDEPENDENT_AMBULATORY_CARE_PROVIDER_SITE_OTHER): Payer: Self-pay | Admitting: Family Medicine

## 2020-06-23 ENCOUNTER — Other Ambulatory Visit: Payer: Self-pay

## 2020-06-23 VITALS — BP 120/76 | HR 69 | Temp 97.5°F | Ht 64.0 in | Wt 311.0 lb

## 2020-06-23 DIAGNOSIS — Z6841 Body Mass Index (BMI) 40.0 and over, adult: Secondary | ICD-10-CM | POA: Diagnosis not present

## 2020-06-23 DIAGNOSIS — F419 Anxiety disorder, unspecified: Secondary | ICD-10-CM

## 2020-06-23 DIAGNOSIS — R7303 Prediabetes: Secondary | ICD-10-CM | POA: Diagnosis not present

## 2020-06-23 DIAGNOSIS — I10 Essential (primary) hypertension: Secondary | ICD-10-CM | POA: Diagnosis not present

## 2020-06-23 DIAGNOSIS — F32A Depression, unspecified: Secondary | ICD-10-CM | POA: Diagnosis not present

## 2020-06-23 DIAGNOSIS — E559 Vitamin D deficiency, unspecified: Secondary | ICD-10-CM

## 2020-06-23 MED ORDER — SERTRALINE HCL 50 MG PO TABS: 50.0000 mg | ORAL_TABLET | Freq: Every day | ORAL | 0 refills | Status: DC

## 2020-06-27 NOTE — Progress Notes (Unsigned)
Chief Complaint:   OBESITY James [name] is here to discuss his progress with his obesity treatment plan along with follow-up of his obesity related diagnoses. James [name] is on the Category 4 Plan and states he is following his eating plan approximately 95% of the time. James [name] states he is walking and circuit exercises 20-30 minutes 4 times per week.  Today's visit was #: 8 Starting weight: 332 lbs Starting date: 02/08/2020 Today's weight: 311 lbs Today's date: 06/23/2020 Total lbs lost to date: 21 lbs Total lbs lost since last in-office visit: 3 lbs  Interim History: Pt has been trying to stay committed to Category 4 plan over the last few weeks. He is trying to gain control of and change bad habits. Staying motivated is his biggest difficulty. His daughter is coming to visit from [place] within the next month. He has no obstacles in the next few weeks.  Subjective:   1. Pre-diabetes Pt's last A1c was 5.7 with an insulin level of 23.5. He is not on meds.  2. Vitamin D deficiency Pt denies nausea, vomiting, and muscle weakness but notes fatigue. Pt is on prescription Vit D. His last Vit D level was 28.1.  3. Essential hypertension BP is well controlled today.  Pt denies chest pain, chest pressure and headache.  BP Readings from Last 3 Encounters:  06/23/20 120/76  05/11/20 128/75  04/06/20 (!) 152/86   4. Anxiety and depression Pt denies suicidal or homicidal ideations. His symptoms are better controlled on Zoloft.  Assessment/Plan:   1. Pre-diabetes James [name] will continue to work on weight loss, exercise, and decreasing simple carbohydrates to help decrease the risk of diabetes. Check labs today.  - Hemoglobin A1c - Insulin, random  2. Vitamin D deficiency Low Vitamin D level contributes to fatigue and are associated with obesity, breast, and colon cancer. He agrees to continue to take prescription Vitamin D @50 ,000 IU every week and will follow-up for routine testing of Vitamin D,  at least 2-3 times per year to avoid over-replacement. Check labs today.  - VITAMIN D 25 Hydroxy (Vit-D Deficiency, Fractures)  3. Essential hypertension James [name] is working on healthy weight loss and exercise to improve blood pressure control. We will watch for signs of hypotension as he continues his lifestyle modifications. Continue current meds. Check labs today.  - Lipid Panel With LDL/HDL Ratio - Comprehensive metabolic panel  4. Anxiety and depression Behavior modification techniques were discussed today to help James [name] deal with his anxiety.  Orders and follow up as documented in patient record.   - sertraline (ZOLOFT) 50 MG tablet; Take 1 tablet (50 mg total) by mouth daily.  Dispense: 30 tablet; Refill: 0  5. Class 3 severe obesity with serious comorbidity and body mass index (BMI) of 50.0 to 59.9 in adult, unspecified obesity type [name]) James [name] is currently in the action stage of change. As such, his goal is to continue with weight loss efforts. He has agreed to the Category 4 Plan.   Exercise goals: No exercise has been prescribed at this time.  Behavioral modification strategies: increasing lean protein intake, meal planning and cooking strategies, keeping healthy foods in the home and planning for success.  James [name] has agreed to follow-up with our clinic in 2-3 weeks. He was informed of the importance of frequent follow-up visits to maximize his success with intensive lifestyle modifications for his multiple health conditions.   Objective:   Blood pressure 120/76, pulse 69, temperature (!) 97.5 F (36.4 C), temperature source Oral,  height 5\' 4"  (1.626 m), weight (!) 311 lb (141.1 kg), SpO2 98 %. Body mass index is 53.38 kg/m.  General: Cooperative, alert, well developed, in no acute distress. HEENT: Conjunctivae and lids unremarkable. Cardiovascular: Regular rhythm.  Lungs: Normal work of breathing. Neurologic: No focal deficits.   Lab Results  Component Value Date    CREATININE 0.97 06/23/2020   BUN 18 06/23/2020   NA 137 06/23/2020   K 4.3 06/23/2020   CL 96 06/23/2020   CO2 25 06/23/2020   Lab Results  Component Value Date   ALT 27 06/23/2020   AST 20 06/23/2020   ALKPHOS 79 06/23/2020   BILITOT 0.4 06/23/2020   Lab Results  Component Value Date   HGBA1C 5.8 (H) 06/23/2020   HGBA1C 5.7 (H) 01/19/2020   HGBA1C 5.8 10/15/2019   HGBA1C 5.8 01/13/2019   HGBA1C 5.6 10/23/2017   Lab Results  Component Value Date   INSULIN 10.7 06/23/2020   INSULIN 23.5 02/08/2020   INSULIN 13.4 04/10/2017   INSULIN 25.3 (H) 01/01/2017   INSULIN 17.2 09/04/2016   Lab Results  Component Value Date   TSH 2.34 01/19/2020   Lab Results  Component Value Date   CHOL 204 (H) 06/23/2020   HDL 52 06/23/2020   LDLCALC 137 (H) 06/23/2020   TRIG 86 06/23/2020   CHOLHDL 3.6 01/19/2020   Lab Results  Component Value Date   WBC 7.1 01/19/2020   HGB 14.5 01/19/2020   HCT 44.1 01/19/2020   MCV 89.3 01/19/2020   PLT 208 01/19/2020   No results found for: IRON, TIBC, FERRITIN  Obesity Behavioral Intervention:   Approximately 15 minutes were spent on the discussion below.  ASK: We discussed the diagnosis of obesity with James [name] today and James [name] agreed to give [name] permission to discuss obesity behavioral modification therapy today.  ASSESS: James [name] has the diagnosis of obesity and his BMI today is 53.4. James [name] is in the action stage of change.   ADVISE: James [name] was educated on the multiple health risks of obesity as well as the benefit of weight loss to improve his health. He was advised of the need for long term treatment and the importance of lifestyle modifications to improve his current health and to decrease his risk of future health problems.  AGREE: Multiple dietary modification options and treatment options were discussed and James [name] agreed to follow the recommendations documented in the above note.  ARRANGE: James [name] was educated on the importance of  frequent visits to treat obesity as outlined per CMS and USPSTF guidelines and agreed to schedule his next follow up appointment today.  Attestation Statements:   Reviewed by clinician on day of visit: allergies, medications, problem list, medical history, surgical history, family history, social history, and previous encounter notes.  Coral Ceo, am acting as transcriptionist for Coralie Common, MD.   I have reviewed the above documentation for accuracy and completeness, and I agree with the above. - Jinny Blossom, MD

## 2020-06-28 LAB — COMPREHENSIVE METABOLIC PANEL
ALT: 27 IU/L (ref 0–44)
AST: 20 IU/L (ref 0–40)
Albumin/Globulin Ratio: 1.7 (ref 1.2–2.2)
Albumin: 4.7 g/dL (ref 3.8–4.8)
Alkaline Phosphatase: 79 IU/L (ref 44–121)
BUN/Creatinine Ratio: 19 (ref 10–24)
BUN: 18 mg/dL (ref 8–27)
Bilirubin Total: 0.4 mg/dL (ref 0.0–1.2)
CO2: 25 mmol/L (ref 20–29)
Calcium: 9.3 mg/dL (ref 8.6–10.2)
Chloride: 96 mmol/L (ref 96–106)
Creatinine, Ser: 0.97 mg/dL (ref 0.76–1.27)
GFR calc Af Amer: 94 mL/min/{1.73_m2} (ref >59)
GFR calc non Af Amer: 81 mL/min/{1.73_m2} (ref >59)
Globulin, Total: 2.8 g/dL (ref 1.5–4.5)
Glucose: 86 mg/dL (ref 65–99)
Potassium: 4.3 mmol/L (ref 3.5–5.2)
Sodium: 137 mmol/L (ref 134–144)
Total Protein: 7.5 g/dL (ref 6.0–8.5)

## 2020-06-28 LAB — INSULIN, RANDOM: INSULIN: 10.7 u[IU]/mL (ref 2.6–24.9)

## 2020-06-28 LAB — HEMOGLOBIN A1C
Est. average glucose Bld gHb Est-mCnc: 120 mg/dL
Hgb A1c MFr Bld: 5.8 % — ABNORMAL HIGH (ref 4.8–5.6)

## 2020-06-28 LAB — LIPID PANEL WITH LDL/HDL RATIO
Cholesterol, Total: 204 mg/dL — ABNORMAL HIGH (ref 100–199)
HDL: 52 mg/dL (ref >39)
LDL Chol Calc (NIH): 137 mg/dL — ABNORMAL HIGH (ref 0–99)
LDL/HDL Ratio: 2.6 ratio (ref 0.0–3.6)
Triglycerides: 86 mg/dL (ref 0–149)
VLDL Cholesterol Cal: 15 mg/dL (ref 5–40)

## 2020-06-28 LAB — VITAMIN D 25 HYDROXY (VIT D DEFICIENCY, FRACTURES): Vit D, 25-Hydroxy: 46.5 ng/mL (ref 30.0–100.0)

## 2020-07-05 ENCOUNTER — Encounter: Payer: Self-pay | Admitting: Adult Health

## 2020-07-06 MED ORDER — HYDROCHLOROTHIAZIDE 25 MG PO TABS: 25.0000 mg | ORAL_TABLET | Freq: Every day | ORAL | 1 refills | Status: DC

## 2020-07-10 ENCOUNTER — Other Ambulatory Visit (INDEPENDENT_AMBULATORY_CARE_PROVIDER_SITE_OTHER): Payer: Self-pay | Admitting: Family Medicine

## 2020-07-10 DIAGNOSIS — E559 Vitamin D deficiency, unspecified: Secondary | ICD-10-CM

## 2020-07-14 ENCOUNTER — Other Ambulatory Visit (INDEPENDENT_AMBULATORY_CARE_PROVIDER_SITE_OTHER): Payer: Self-pay | Admitting: Family Medicine

## 2020-07-14 ENCOUNTER — Ambulatory Visit (INDEPENDENT_AMBULATORY_CARE_PROVIDER_SITE_OTHER): Payer: PPO | Admitting: Physician Assistant

## 2020-07-14 ENCOUNTER — Other Ambulatory Visit: Payer: Self-pay

## 2020-07-14 ENCOUNTER — Encounter (INDEPENDENT_AMBULATORY_CARE_PROVIDER_SITE_OTHER): Payer: Self-pay | Admitting: Physician Assistant

## 2020-07-14 VITALS — BP 125/76 | HR 50 | Temp 97.5°F | Ht 64.0 in | Wt 314.0 lb

## 2020-07-14 DIAGNOSIS — F419 Anxiety disorder, unspecified: Secondary | ICD-10-CM

## 2020-07-14 DIAGNOSIS — Z6841 Body Mass Index (BMI) 40.0 and over, adult: Secondary | ICD-10-CM

## 2020-07-14 DIAGNOSIS — E559 Vitamin D deficiency, unspecified: Secondary | ICD-10-CM

## 2020-07-14 DIAGNOSIS — F32A Depression, unspecified: Secondary | ICD-10-CM

## 2020-07-14 MED ORDER — VITAMIN D (ERGOCALCIFEROL) 1.25 MG (50000 UNIT) PO CAPS: 50000.0000 [IU] | ORAL_CAPSULE | ORAL | 0 refills | Status: DC

## 2020-07-14 MED ORDER — SERTRALINE HCL 100 MG PO TABS
100.0000 mg | ORAL_TABLET | Freq: Every day | ORAL | 0 refills | Status: DC
Start: 2020-07-14 — End: 2020-07-28

## 2020-07-14 NOTE — Progress Notes (Signed)
Office: [phone]  /  Fax: [phone]    Date: July 26, 2020   Appointment Start Time: 9:01am Duration: 45 minutes Provider: Glennie Isle, Psy.D. Type of Session: Intake for Individual Therapy  Location of Patient: Home Location of Provider: Provider's Home (private office) Type of Contact: Telepsychological Visit via MyChart Video Visit  Informed Consent: Prior to proceeding with today's appointment, two pieces of identifying information were obtained. In addition, James [name]'s physical location at the time of this appointment was obtained as well a phone number he could be reached at in the event of technical difficulties. James [name] and this provider participated in today's telepsychological service.   The provider's role was explained to [employer]. The provider reviewed and discussed issues of confidentiality, privacy, and limits therein (e.g., reporting obligations). In addition to verbal informed consent, written informed consent for psychological services was obtained prior to the initial appointment. Since the clinic is not a 24/7 crisis center, mental health emergency resources were shared and this  provider explained MyChart, e-mail, voicemail, and/or other messaging systems should be utilized only for non-emergency reasons. This provider also explained that information obtained during appointments will be placed in James [name]'s medical record and relevant information will be shared with other providers at [facility] for coordination of care. James [name] agreed information may be shared with other [facility] providers as needed for coordination of care and by signing the service agreement document, he provided written consent for coordination of care. Prior to initiating telepsychological services, James [name] completed an informed consent document, which included the development of a safety plan (i.e., an emergency contact and emergency resources) in the event of an  emergency/crisis. Ovie expressed understanding of the rationale of the safety plan. James [name] verbally acknowledged understanding he is ultimately responsible for understanding his insurance benefits for telepsychological and in-person services. This provider also reviewed confidentiality, as it relates to telepsychological services, as well as the rationale for telepsychological services (i.e., to reduce exposure risk to COVID-19). James [name]  acknowledged understanding that appointments cannot be recorded without both party consent and he is aware he is responsible for securing confidentiality on his end of the session. James [name] verbally consented to proceed.  Chief Complaint/HPI: James [name] was referred by Abby Potash, PA-C due to anxiety and depression. Per the note for the visit with Abby Potash, PA-C on July 14, 2020, "Javante reports boredom, stress, and emotional eating. He denies suicidal or homicidal ideas. He does not feel like Zoloft is helping with his mood."  During today's appointment, James [name] was verbally administered a questionnaire assessing various behaviors related to emotional eating. James [name] endorsed the following: experience food cravings on a regular basis, eat certain foods when you are anxious, stressed, depressed, or your feelings are hurt, use food to help you cope with emotional situations, find food is comforting to you, overeat when you are angry or upset, overeat when you are worried about something, overeat frequently when you are bored or lonely, overeat when you are angry at someone just to show them they cannot control you and overeat when you are alone, but eat much less when you are with other people. Bertrand believes the onset of emotional eating was likely in childhood, and described the current frequency of emotional eating as "few times a week." In addition, James [name] denied a history of binge eating. James [name] denied a history of restricting food intake, purging and engagement in other  compensatory strategies for weight loss, and has never been diagnosed with an eating  disorder. He also denied a history of treatment for emotional eating. He further discussed having a "self-defeating" attitude due to weight concerns for years. 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   Mental Status Examination:  Appearance: well groomed and appropriate hygiene  Behavior: appropriate to circumstances Mood: depressed Affect: mood congruent Speech: normal in rate, volume, and tone Eye Contact: appropriate Psychomotor Activity: unable to assess  Gait: unable to assess Thought Process: linear, logical, and goal directed  Thought Content/Perception: denies suicidal and homicidal ideation, plan, and intent and no hallucinations, delusions, bizarre thinking or behavior reported or observed Orientation: time, person, place, and purpose of appointment Memory/Concentration: memory, attention, language, and fund of knowledge intact  Insight/Judgment: fair  Family & Psychosocial History: James [name] reported he is married and he has two adult children. 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 Additionally, James [name] shared his highest level of education obtained is "some college." Currently, James [name]'s social support system consists of his children. Moreover, James [name] stated he resides with his wife and dog.   Medical History:  Past Medical History:  Diagnosis Date  . Arthritis   . Bilateral swelling of feet   . Chicken pox   . Complication of anesthesia    difficulty waking up  . Depression   . Hypertension   . Joint pain   . Obesity   . Osteoarthritis   . Prediabetes   . Shortness of breath   . Swelling    feet and legs  . Vitamin D deficiency    Past Surgical History:  Procedure Laterality Date  . cartilage removal    . COLONOSCOPY WITH PROPOFOL N/A 12/14/2019   Procedure: COLONOSCOPY WITH  PROPOFOL;  Surgeon: Doran Stabler, MD;  Location: [facility];  Service: Gastroenterology;  Laterality: N/A;  . KNEE SURGERY  2000  . POLYPECTOMY  12/14/2019   Procedure: POLYPECTOMY;  Surgeon: Doran Stabler, MD;  Location: [facility];  Service: Gastroenterology;;  . TOTAL HIP ARTHROPLASTY Left 02/11/2019   Procedure: TOTAL HIP [place];  Surgeon: Gaynelle Arabian, MD;  Location: [facility];  Service: Orthopedics;  Laterality: Left;  18min   Current Outpatient Medications on File Prior to Visit  Medication Sig Dispense Refill  . acetaminophen (TYLENOL) 500 MG tablet Take 1,000 mg by mouth at bedtime as needed for moderate pain.    Marland Kitchen amoxicillin (AMOXIL) 500 MG capsule Take 2,000 mg by mouth See admin instructions. Take 2000 mg 1 hour prior to dental work    . cetirizine (ZYRTEC) 10 MG tablet Take 10 mg by mouth daily.    . fluticasone (FLONASE) 50 MCG/ACT nasal spray Place 1 spray into both nostrils daily.     . hydrochlorothiazide (HYDRODIURIL) 25 MG tablet Take 1 tablet (25 mg total) by mouth daily. 90 tablet 1  . ibuprofen (ADVIL) 200 MG tablet Take 400 mg by mouth at bedtime as needed for moderate pain.     Marland Kitchen Ketotifen Fumarate (ALLERGY EYE DROPS OP) Place 1 drop into both eyes daily as needed (allergies).    Marland Kitchen lidocaine (LMX) 4 % cream Apply 1 application topically daily as needed (pain).    Marland Kitchen lisinopril (ZESTRIL) 10 MG tablet TAKE 2 TABLETS BY MOUTH EVERY DAY 180 tablet 1  . Multiple Vitamin (MULTIVITAMIN) tablet Take 1 tablet by mouth daily.    . sertraline (ZOLOFT) 100 MG tablet Take 1 tablet (100 mg total)  by mouth daily. 30 tablet 0  . Vitamin D, Ergocalciferol, (DRISDOL) 1.25 MG (50000 UNIT) CAPS capsule Take 1 capsule (50,000 Units total) by mouth every 7 (seven) days. 4 capsule 0   No current facility-administered medications on file prior to visit.   Mental Health History: James [name] reported he attended therapeutic services with Dr. Michail Sermon ([place]) for approximately four appointments at the end of last year to address anger concerns. It was recommended he re-initiate services with Dr. Michail Sermon to address caregiver burnout. He agreed. James [name] reported he discontinued Zoloft as he was "not noticing any change." It was recommended he discuss the aforementioned with his prescribing provider. He agreed. James [name] reported there is no history of hospitalizations for psychiatric concerns. James [name] stated his paternal grandmother suffered from mental health concerns, but was unsure of any diagnoses. James [name] reported there is no history of trauma including psychological, physical  and sexual abuse, as well as neglect.   James [name] described his typical mood lately as "depressed." Aside from concerns noted above and endorsed on the PHQ-9 and GAD-7, James [name] reported he began experiencing sleep difficulties around October 2020 following a total hip replacement. He also discussed having "vivid dreams" that are "traumatic" in nature in the past few months. It was recommended he discuss the aforementioned with his PCP; he agreed. He reported a history of social withdrawal and "severe" decreased motivation. Gianmarco denied current alcohol use. He denied tobacco use. He denied illicit/recreational substance use. Regarding caffeine intake, James [name] reported consuming approximately 64-78oz of coffee daily. Furthermore, James [name] indicated he is not experiencing the following: hallucinations and delusions, paranoia, symptoms of mania , crying spells and panic attacks. He also denied history of and current suicidal ideation, plan, and intent; history of and current homicidal ideation, plan, and intent; and history of and current engagement in self-harm. Notably, Jotham endorsed item 9 (i.e., "Do you feel that your weight problem is so hopeless that sometimes life doesn't seem worth living?") on the modified PHQ-9 during his initial appointment with Dr. Coralie Common on February 09, 2020. James [name]  clarified he endorsed the item due to feeling stuck about weight loss and not due to suicidal ideation.   The following strengths were reported by James [name]: dependable and friendly. The following strengths were observed by this provider: ability to express thoughts and feelings during the therapeutic session, ability to establish and benefit from a therapeutic relationship, willingness to work toward established goal(s) with the clinic and ability to engage in reciprocal conversation.   Legal History: James [name] reported he was "accused of embezzling" in 2007.  Structured Assessments Results: The Patient Health Questionnaire-9 (PHQ-9) is a self-report measure that assesses symptoms and severity of depression over the course of the last two weeks. James [name] obtained a score of 8 suggesting mild depression. Chane finds the endorsed symptoms to be somewhat difficult. [0= Not at all; 1= Several days; 2= More than half the days; 3= Nearly every day] Little interest or pleasure in doing things 1  Feeling down, depressed, or hopeless 1  Trouble falling or staying asleep, or sleeping too much 3  Feeling tired or having little energy 2  Poor appetite or overeating 0  Feeling bad about yourself --- or that you are a failure or have let yourself or your family down 0  Trouble concentrating on things, such as reading the newspaper or watching television 1  Moving or speaking so slowly that other people could have noticed? Or the opposite --- being so fidgety or restless  that you have been moving around a lot more than usual 0  Thoughts that you would be better off dead or hurting yourself in some way 0  PHQ-9 Score 8    The Generalized Anxiety Disorder-7 (GAD-7) is a brief self-report measure that assesses symptoms of anxiety over the course of the last two weeks. Nafis obtained a score of 2 suggesting minimal anxiety. Markale finds the endorsed symptoms to be somewhat difficult. [0= Not at all; 1= Several days; 2= Over  half the days; 3= Nearly every day] Feeling nervous, anxious, on edge 1  Not being able to stop or control worrying 0  Worrying too much about different things 0  Trouble relaxing 0  Being so restless that it's hard to sit still 0  Becoming easily annoyed or irritable 1  Feeling afraid as if something awful might happen 0  GAD-7 Score 2   Interventions:  Conducted a chart review Focused on rapport building Verbally administered PHQ-9 and GAD-7 for symptom monitoring Verbally administered Food & Mood questionnaire to assess various behaviors related to emotional eating Provided emphatic reflections and validation Collaborated with patient on a treatment goal  Recommended/discussed option for longer-term therapeutic services Psychoeducation provided regarding pleasurable activities Psychoeducation provided regarding self-care  Brief psychoeducation provided regarding caregiver burnout  Provisional DSM-5 Diagnosis(es): 311 (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 The next appointment will be scheduled in two weeks, which will be via MyChart Video Visit. The following treatment goal was established: increase coping skills. This provider will regularly review the treatment plan and medical chart to keep informed of status changes. Demarrius expressed understanding and agreement with the initial treatment plan of care.  Psychoeducation regarding the importance of self-care utilizing the oxygen mask metaphor was provided. Psychoeducation regarding pleasurable activities, including its impact on emotional eating and overall well-being was also provided. Aydeen was provided with a handout with various options of pleasurable activities, and was encouraged to engage in one activity a  day and additional activities as needed when triggered to emotionally eat. James [name] agreed. James [name] provided verbal consent during today's appointment for this provider to send the handout via e-mail. Additionally, Meshilem reported willingness to re-initiate therapeutic services with Dr. Michail Sermon.

## 2020-07-20 NOTE — Progress Notes (Signed)
Chief Complaint:   OBESITY James [name] is here to discuss his progress with his obesity treatment plan along with follow-up of his obesity related diagnoses. James [name] is on the Category 4 Plan and states he is following his eating plan approximately 95% of the time. James [name] states he is doing cardio and walking for 30 minutes 4 times per week.  Today's visit was #: 9 Starting weight: 332 lbs Starting date: 02/08/2020 Today's weight: 314 lbs Today's date: 07/14/2020 Total lbs lost to date: 18 Total lbs lost since last in-office visit: 0  Interim History: James [name] reports that he has been making substitutions for some things on the meal plan. He is eating Raisin Brain or Rice Chex for breakfast. He states that he is not excessively hungry but he is boredom eating.  Subjective:   1. Vitamin D deficiency James [name] denies nausea, vomiting, or muscle weakness. He is on Vit D weekly.  2. Anxiety and depression James [name] reports boredom, stress, and emotional eating. He denies suicidal or homicidal ideas. He does not feel like Zoloft is helping with his mood.  Assessment/Plan:   1. Vitamin D deficiency Low Vitamin D level contributes to fatigue and are associated with obesity, breast, and colon cancer. We will refill prescription Vitamin D for 1 month. James [name] will follow-up for routine testing of Vitamin D, at least 2-3 times per year to avoid over-replacement.  - Vitamin D, Ergocalciferol, (DRISDOL) 1.25 MG (50000 UNIT) CAPS capsule; Take 1 capsule (50,000 Units total) by mouth every 7 (seven) days.  Dispense: 4 capsule; Refill: 0  2. Anxiety and depression Behavior modification techniques were discussed today to help James [name] deal with his anxiety and depression. James [name] agreed to increase Zoloft to 100 mg with no refills. We will refer to James [name], our Bariatric Psychologist for evaluation. Orders and follow up as documented in patient record.   - sertraline (ZOLOFT) 100 MG tablet; Take 1 tablet (100 mg  total) by mouth daily.  Dispense: 30 tablet; Refill: 0  3. Class 3 severe obesity with serious comorbidity and body mass index (BMI) of 50.0 to 59.9 in adult, unspecified obesity type James [name]) James [name] is currently in the action stage of change. As such, his goal is to continue with weight loss efforts. He has agreed to the Category 4 Plan.   Exercise goals: As is.  Behavioral modification strategies: increasing lean protein intake and decreasing simple carbohydrates.  James [name] has agreed to follow-up with our clinic in 2 to 3 weeks. He was informed of the importance of frequent follow-up visits to maximize his success with intensive lifestyle modifications for his multiple health conditions.   Objective:   Blood pressure 125/76, pulse (!) 50, temperature (!) 97.5 F (36.4 C), height 5\' 4"  (1.626 m), weight (!) 314 lb (142.4 kg), SpO2 95 %. Body mass index is 53.9 kg/m.  General: Cooperative, alert, well developed, in no acute distress. HEENT: Conjunctivae and lids unremarkable. Cardiovascular: Regular rhythm.  Lungs: Normal work of breathing. Neurologic: No focal deficits.   Lab Results  Component Value Date   CREATININE 0.97 06/23/2020   BUN 18 06/23/2020   NA 137 06/23/2020   K 4.3 06/23/2020   CL 96 06/23/2020   CO2 25 06/23/2020   Lab Results  Component Value Date   ALT 27 06/23/2020   AST 20 06/23/2020   ALKPHOS 79 06/23/2020   BILITOT 0.4 06/23/2020   Lab Results  Component Value Date   HGBA1C 5.8 (H) 06/23/2020   HGBA1C 5.7 (  H) 01/19/2020   HGBA1C 5.8 10/15/2019   HGBA1C 5.8 01/13/2019   HGBA1C 5.6 10/23/2017   Lab Results  Component Value Date   INSULIN 10.7 06/23/2020   INSULIN 23.5 02/08/2020   INSULIN 13.4 04/10/2017   INSULIN 25.3 (H) 01/01/2017   INSULIN 17.2 09/04/2016   Lab Results  Component Value Date   TSH 2.34 01/19/2020   Lab Results  Component Value Date   CHOL 204 (H) 06/23/2020   HDL 52 06/23/2020   LDLCALC 137 (H) 06/23/2020   TRIG 86  06/23/2020   CHOLHDL 3.6 01/19/2020   Lab Results  Component Value Date   WBC 7.1 01/19/2020   HGB 14.5 01/19/2020   HCT 44.1 01/19/2020   MCV 89.3 01/19/2020   PLT 208 01/19/2020   No results found for: IRON, TIBC, FERRITIN  Obesity Behavioral Intervention:   Approximately 15 minutes were spent on the discussion below.  ASK: We discussed the diagnosis of obesity with James [name] today and James [name] agreed to give [name] permission to discuss obesity behavioral modification therapy today.  ASSESS: James [name] has the diagnosis of obesity and his BMI today is 53.87. James [name] is in the action stage of change.   ADVISE: James [name] was educated on the multiple health risks of obesity as well as the benefit of weight loss to improve his health. He was advised of the need for long term treatment and the importance of lifestyle modifications to improve his current health and to decrease his risk of future health problems.  AGREE: Multiple dietary modification options and treatment options were discussed and James [name] agreed to follow the recommendations documented in the above note.  ARRANGE: James [name] was educated on the importance of frequent visits to treat obesity as outlined per CMS and USPSTF guidelines and agreed to schedule his next follow up appointment today.  Attestation Statements:   Reviewed by clinician on day of visit: allergies, medications, problem list, medical history, surgical history, family history, social history, and previous encounter notes.   Wilhemena Durie, am acting as transcriptionist for [employer], PA-C.  I have reviewed the above documentation for accuracy and completeness, and I agree with the above. Abby Potash, PA-C

## 2020-07-26 ENCOUNTER — Telehealth (INDEPENDENT_AMBULATORY_CARE_PROVIDER_SITE_OTHER): Payer: PPO | Admitting: Psychology

## 2020-07-26 DIAGNOSIS — F3289 Other specified depressive episodes: Secondary | ICD-10-CM

## 2020-07-28 ENCOUNTER — Encounter (INDEPENDENT_AMBULATORY_CARE_PROVIDER_SITE_OTHER): Payer: Self-pay | Admitting: Family Medicine

## 2020-07-28 ENCOUNTER — Other Ambulatory Visit: Payer: Self-pay

## 2020-07-28 ENCOUNTER — Ambulatory Visit (INDEPENDENT_AMBULATORY_CARE_PROVIDER_SITE_OTHER): Payer: PPO | Admitting: Family Medicine

## 2020-07-28 VITALS — BP 133/69 | HR 75 | Temp 97.6°F | Ht 64.0 in | Wt 315.0 lb

## 2020-07-28 DIAGNOSIS — R7303 Prediabetes: Secondary | ICD-10-CM

## 2020-07-28 DIAGNOSIS — F3289 Other specified depressive episodes: Secondary | ICD-10-CM | POA: Diagnosis not present

## 2020-07-28 DIAGNOSIS — Z6841 Body Mass Index (BMI) 40.0 and over, adult: Secondary | ICD-10-CM

## 2020-07-28 MED ORDER — BUPROPION HCL ER (XL) 150 MG PO TB24
150.0000 mg | ORAL_TABLET | Freq: Every day | ORAL | 0 refills | Status: DC
Start: 2020-07-28 — End: 2020-08-15

## 2020-08-02 NOTE — Progress Notes (Signed)
Chief Complaint:   OBESITY James [name] is here to discuss his progress with his obesity treatment plan along with follow-up of his obesity related diagnoses. James [name] is on the Category 4 Plan and states he is following his eating plan approximately 95% of the time. James [name] states he is walking and doing yard work 20-60 minutes 6 times per week.  Today's visit was #: 10 Starting weight: 332 lbs Starting date: 02/08/2020 Today's weight: 315 lbs Today's date: 07/28/2020 Total lbs lost to date: 17 Total lbs lost since last in-office visit: 0  Interim History: Jabron has spoken to Dr. Mallie Mussel over the last few weeks. He has also communicated with Dr. Michail Sermon to get back in there. He's been walking more and signed up for a 2 year study called [employer]. When he goes off track, it's snacking particularly with Triscuit crackers and hummus and cheese slices. If he is getting hungry, he is trying to eat more plain protein.  Subjective:   1. Other depression, with emotional eating James [name] is having some marital strife. No reported aspect of anxiety. He does not report issues with sleeping. He has not history of seizures.  2. Pre-diabetes James [name]'s last A1c was 5.8 with an insulin level of 10.7. He is not on meds.  Lab Results  Component Value Date   HGBA1C 5.8 (H) 06/23/2020   Lab Results  Component Value Date   INSULIN 10.7 06/23/2020   INSULIN 23.5 02/08/2020   INSULIN 13.4 04/10/2017   INSULIN 25.3 (H) 01/01/2017   INSULIN 17.2 09/04/2016    Assessment/Plan:   1. Other depression, with emotional eating Behavior modification techniques were discussed today to help James [name] deal with his emotional/non-hunger eating behaviors.  Orders and follow up as documented in patient record. Start Wellbutrin 150 mg, as per below.  - buPROPion (WELLBUTRIN XL) 150 MG 24 hr tablet; Take 1 tablet (150 mg total) by mouth daily.  Dispense: 30 tablet; Refill: 0  2. Pre-diabetes James [name] will continue to work on  weight loss, exercise, and decreasing simple carbohydrates to help decrease the risk of diabetes. Repeat labs at the end of June.  3. Class 3 severe obesity with serious comorbidity and body mass index (BMI) of 50.0 to 59.9 in adult, unspecified obesity type James [name]) James [name] is currently in the action stage of change. As such, his goal is to continue with weight loss efforts. He has agreed to the Category 4 Plan.   Exercise goals: As is  Behavioral modification strategies: increasing lean protein intake, meal planning and cooking strategies, keeping healthy foods in the home and planning for success.  James [name] has agreed to follow-up with our clinic in 2-3 weeks. He was informed of the importance of frequent follow-up visits to maximize his success with intensive lifestyle modifications for his multiple health conditions.   Objective:   Blood pressure 133/69, pulse 75, temperature 97.6 F (36.4 C), temperature source Oral, height 5\' 4"  (1.626 m), weight (!) 315 lb (142.9 kg), SpO2 97 %. Body mass index is 54.07 kg/m.  General: Cooperative, alert, well developed, in no acute distress. HEENT: Conjunctivae and lids unremarkable. Cardiovascular: Regular rhythm.  Lungs: Normal work of breathing. Neurologic: No focal deficits.   Lab Results  Component Value Date   CREATININE 0.97 06/23/2020   BUN 18 06/23/2020   NA 137 06/23/2020   K 4.3 06/23/2020   CL 96 06/23/2020   CO2 25 06/23/2020   Lab Results  Component Value Date   ALT 27 06/23/2020  AST 20 06/23/2020   ALKPHOS 79 06/23/2020   BILITOT 0.4 06/23/2020   Lab Results  Component Value Date   HGBA1C 5.8 (H) 06/23/2020   HGBA1C 5.7 (H) 01/19/2020   HGBA1C 5.8 10/15/2019   HGBA1C 5.8 01/13/2019   HGBA1C 5.6 10/23/2017   Lab Results  Component Value Date   INSULIN 10.7 06/23/2020   INSULIN 23.5 02/08/2020   INSULIN 13.4 04/10/2017   INSULIN 25.3 (H) 01/01/2017   INSULIN 17.2 09/04/2016   Lab Results  Component Value Date    TSH 2.34 01/19/2020   Lab Results  Component Value Date   CHOL 204 (H) 06/23/2020   HDL 52 06/23/2020   LDLCALC 137 (H) 06/23/2020   TRIG 86 06/23/2020   CHOLHDL 3.6 01/19/2020   Lab Results  Component Value Date   WBC 7.1 01/19/2020   HGB 14.5 01/19/2020   HCT 44.1 01/19/2020   MCV 89.3 01/19/2020   PLT 208 01/19/2020   No results found for: IRON, TIBC, FERRITIN  Obesity Behavioral Intervention:   Approximately 15 minutes were spent on the discussion below.  ASK: We discussed the diagnosis of obesity with James [name] today and James [name] agreed to give [name] permission to discuss obesity behavioral modification therapy today.  ASSESS: James [name] has the diagnosis of obesity and his BMI today is 54.2. James [name] is in the action stage of change.   ADVISE: James [name] was educated on the multiple health risks of obesity as well as the benefit of weight loss to improve his health. He was advised of the need for long term treatment and the importance of lifestyle modifications to improve his current health and to decrease his risk of future health problems.  AGREE: Multiple dietary modification options and treatment options were discussed and James [name] agreed to follow the recommendations documented in the above note.  ARRANGE: James [name] was educated on the importance of frequent visits to treat obesity as outlined per CMS and USPSTF guidelines and agreed to schedule his next follow up appointment today.  Attestation Statements:   Reviewed by clinician on day of visit: allergies, medications, problem list, medical history, surgical history, family history, social history, and previous encounter notes.  Coral Ceo, am acting as transcriptionist for Coralie Common, MD.   I have reviewed the above documentation for accuracy and completeness, and I agree with the above. - Jinny Blossom, MD

## 2020-08-11 ENCOUNTER — Telehealth (INDEPENDENT_AMBULATORY_CARE_PROVIDER_SITE_OTHER): Payer: PPO | Admitting: Psychology

## 2020-08-11 DIAGNOSIS — F3289 Other specified depressive episodes: Secondary | ICD-10-CM

## 2020-08-11 NOTE — Progress Notes (Signed)
  Office: [phone]  /  Fax: [phone]    Date: August 11, 2020    Appointment Start Time: 8:33am Duration: 29 minutes Provider: Glennie Isle, Psy.D. Type of Session: Individual Therapy  Location of Patient: Home Location of Provider: Provider's Home (private office) Type of Contact: Telepsychological Visit via MyChart Video Visit  Session Content: James [name] is a 65 y.o. male presenting for a follow-up appointment to address the previously established treatment goal of increasing coping skills. Today's appointment was a telepsychological visit due to COVID-19. James [name] provided verbal consent for today's telepsychological appointment and he is aware he is responsible for securing confidentiality on his end of the session. Prior to proceeding with today's appointment, James [name]'s physical location at the time of this appointment was obtained as well a phone number he could be reached at in the event of technical difficulties. James [name] and this provider participated in today's telepsychological service.   This provider conducted a brief check-in. James [name] shared about recent event, including a recent trip to see his son and his family. He further shared, "Eating is going pretty well." Notably, he shared he did not have access to foods from his meal plan while traveling; however, he described making better choices and engaging in portion control. 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 Positive reinforcement was provided.   Reviewed pleasurable activities. James [name] shared about activities he recently engaged in. Remainder of session focused on eating habits. James [name] described a reduction in emotional eating behaviors since starting with the clinic, noting he was recently prescribed Wellbutrin by Dr. Jearld Shines. Moreover, psychoeducation regarding emotional versus physical hunger was provided. James [name] was given a handout to utilize between now and the next  appointment to increase awareness of hunger patterns and subsequent eating. James [name] provided verbal consent during today's appointment for this provider to send a handout about hunger patterns via e-mail. Overall, James [name] was receptive to today's appointment as evidenced by openness to sharing, responsiveness to feedback, and willingness to explore hunger patterns.  Mental Status Examination:  Appearance: well groomed and appropriate hygiene  Behavior: appropriate to circumstances Mood: euthymic Affect: mood congruent Speech: normal in rate, volume, and tone Eye Contact: appropriate Psychomotor Activity: unable to assess  Gait: unable to assess Thought Process: linear, logical, and goal directed  Thought Content/Perception: no hallucinations, delusions, bizarre thinking or behavior reported or observed and no evidence of suicidal and homicidal ideation, plan, and intent Orientation: time, person, place, and purpose of appointment Memory/Concentration: memory, attention, language, and fund of knowledge intact  Insight/Judgment: good  Interventions:  Conducted a brief chart review Provided empathic reflections and validation Reviewed content from the previous session Provided positive reinforcement Employed supportive psychotherapy interventions to facilitate reduced distress and to improve coping skills with identified stressors Psychoeducation provided regarding physical versus emotional hunger  DSM-5 Diagnosis(es): 311 (F32.8) Other Specified Depressive Disorder, Emotional Eating Behaviors  Treatment Goal & Progress: During the initial appointment with this provider, the following treatment goal was established: increase coping skills. Progress is limited, as Jodeci has just begun treatment with this provider; however, he is receptive to the interaction and interventions and rapport is being established.   Plan: The next appointment will be scheduled in two weeks, which will be via MyChart  Video Visit. The next session will focus on working towards the established treatment goal. Additionally, James [name] reported he scheduled an appointment with Dr. Michail Sermon ([place]) on August 15, 2020.

## 2020-08-11 NOTE — Progress Notes (Signed)
  Office: [phone]  /  Fax: [phone]    Date: August 25, 2020   Appointment Start Time: 8:01am Duration: 24 minutes Provider: Glennie Isle, Psy.D. Type of Session: Individual Therapy  Location of Patient: Home Location of Provider: Provider's Home (private office) Type of Contact: Telepsychological Visit via MyChart Video Visit  Session Content: James [name] is a 65 y.o. male presenting for a follow-up appointment to address the previously established treatment goal of increasing coping skills. Today's appointment was a telepsychological visit due to COVID-19. James [name] provided verbal consent for today's telepsychological appointment and he is aware he is responsible for securing confidentiality on his end of the session. Prior to proceeding with today's appointment, James [name]'s physical location at the time of this appointment was obtained as well a phone number he could be reached at in the event of technical difficulties. James [name] and this provider participated in today's telepsychological service.   This provider conducted a brief check-in. James [name] shared, "Just trying to stay on plan. Mostly successful." He also shared about his appointment with James [name] ([place]), adding they discussed mindful eating. Of note, James [name] stated that the [employer] Watch that he is using for the study he is part of is indicating he is only having a couple hours of restful sleep. James [name] expressed concern as he is in bed for longer periods of time. Thus, this provider recommended he reach out to his PCP; he agreed.   Psychoeducation regarding triggers for emotional eating was provided. James [name] was provided a handout, and encouraged to utilize the handout to increase awareness of triggers and frequency. James [name] agreed. This provider also discussed behavioral strategies for specific triggers, such as placing the utensil down when conversing to avoid mindless eating. James [name] provided verbal consent during today's  appointment for this provider to send a handout about triggers via e-mail. James [name] was receptive to today's appointment as evidenced by openness to sharing, responsiveness to feedback, and willingness to explore triggers for emotional eating.  Mental Status Examination:  Appearance: well groomed and appropriate hygiene  Behavior: appropriate to circumstances Mood: sad Affect: mood congruent Speech: normal in rate, volume, and tone Eye Contact: appropriate Psychomotor Activity: unable to assess  Gait: unable to assess Thought Process: linear, logical, and goal directed  Thought Content/Perception: no hallucinations, delusions, bizarre thinking or behavior reported or observed and no evidence or endorsement of suicidal and homicidal ideation, plan, and intent Orientation: time, person, place, and purpose of appointment Memory/Concentration: memory, attention, language, and fund of knowledge intact  Insight/Judgment: good  Interventions:  Conducted a brief chart review Provided empathic reflections and validation Employed supportive psychotherapy interventions to facilitate reduced distress and to improve coping skills with identified stressors Psychoeducation provided regarding triggers for emotional eating Discussed termination planning  DSM-5 Diagnosis(es): 311 (F32.8) Other Specified Depressive Disorder, Emotional Eating Behaviors  Treatment Goal & Progress: During the initial appointment with this provider, the following treatment goal was established: increase coping skills. James [name] has demonstrated progress in his goal as evidenced by increased awareness of hunger patterns and willingness to implement discussed strategies.   Plan: James [name] declined future appointments with this provider as he plans to continue with James [name], noting their next appointment is on August 29, 2020. He acknowledged understanding that he may request a follow-up appointment with this provider in the future as  long as he is still established with the clinic. No further follow-up planned by this provider.

## 2020-08-15 ENCOUNTER — Ambulatory Visit (INDEPENDENT_AMBULATORY_CARE_PROVIDER_SITE_OTHER): Payer: PPO | Admitting: Adult Health

## 2020-08-15 ENCOUNTER — Other Ambulatory Visit: Payer: Self-pay

## 2020-08-15 ENCOUNTER — Encounter (INDEPENDENT_AMBULATORY_CARE_PROVIDER_SITE_OTHER): Payer: Self-pay | Admitting: Adult Health

## 2020-08-15 ENCOUNTER — Ambulatory Visit (INDEPENDENT_AMBULATORY_CARE_PROVIDER_SITE_OTHER): Payer: PPO | Admitting: Psychologist

## 2020-08-15 VITALS — BP 143/81 | HR 70 | Temp 97.7°F | Ht 64.0 in | Wt 310.0 lb

## 2020-08-15 DIAGNOSIS — E559 Vitamin D deficiency, unspecified: Secondary | ICD-10-CM | POA: Diagnosis not present

## 2020-08-15 DIAGNOSIS — Z6841 Body Mass Index (BMI) 40.0 and over, adult: Secondary | ICD-10-CM | POA: Diagnosis not present

## 2020-08-15 DIAGNOSIS — F3289 Other specified depressive episodes: Secondary | ICD-10-CM | POA: Diagnosis not present

## 2020-08-15 DIAGNOSIS — R7303 Prediabetes: Secondary | ICD-10-CM | POA: Diagnosis not present

## 2020-08-15 DIAGNOSIS — F321 Major depressive disorder, single episode, moderate: Secondary | ICD-10-CM

## 2020-08-15 MED ORDER — BUPROPION HCL ER (XL) 150 MG PO TB24: 150.0000 mg | ORAL_TABLET | Freq: Every day | ORAL | 0 refills | Status: DC

## 2020-08-15 MED ORDER — VITAMIN D (ERGOCALCIFEROL) 1.25 MG (50000 UNIT) PO CAPS: 50000.0000 [IU] | ORAL_CAPSULE | ORAL | 0 refills | Status: DC

## 2020-08-16 NOTE — Progress Notes (Signed)
Chief Complaint:   OBESITY James [name] is here to discuss his progress with his obesity treatment plan along with follow-up of his obesity related diagnoses. James [name] is on the Category 4 Plan and states he is following his eating plan approximately 90% of the time. James [name] states he is doing yard work 60 minutes 4 times per week.  Today's visit was #: 11 Starting weight: 332 lbs Starting date: 02/08/2020 Today's weight: 310 lbs Today's date: 08/15/2020 Total lbs lost to date: 22 lbs Total lbs lost since last in-office visit: 5  Interim History: The last several weeks- his daughter has been home visiting for weeks. The family went to visit son and his family for a long weekend.  Of note: James [name] has been participating in [employer] Referral Study by [employer] aspects of health and fitness via Apple Watch. The study is 2 years in duration.  Subjective:   1. Pre-diabetes James [name]'s A1c has been in the upper 5's for more than 5 years. His 06/23/2020 A1c was 5.8. He is not on Metformin.  Lab Results  Component Value Date   HGBA1C 5.8 (H) 06/23/2020   Lab Results  Component Value Date   INSULIN 10.7 06/23/2020   INSULIN 23.5 02/08/2020   INSULIN 13.4 04/10/2017   INSULIN 25.3 (H) 01/01/2017   INSULIN 17.2 09/04/2016    2. Other depression, with emotional eating James [name] was started on Wellbutrin XL 150 mg QD on 07/28/2020.  He reports excellent mood and significant decreased reduction of cravings. He stopped sertraline 50 mg QD- he never increased to 100 mg. He estimates to have stopped SSRI approximately 4 weeks ago.  His BP is slightly elevated today 143/81 and ambulatory readings SBP 120s DBP 70s.  3. Vitamin D deficiency James [name]'s Vitamin D level was 46.5 on 06/23/2020. He is currently taking prescription vitamin D 50,000 IU each week. He denies nausea, vomiting or muscle weakness.   Ref. Range 06/23/2020 13:17  Vitamin D, 25-Hydroxy Latest Ref Range: 30.0 - 100.0 ng/mL 46.5    Assessment/Plan:   1. Pre-diabetes James [name] will continue to work on weight loss, exercise, and decreasing simple carbohydrates to help decrease the risk of diabetes. Continue category 4 meal plan and regular exercise.  2. Other depression, with emotional eating Behavior modification techniques were discussed today to help James [name] deal with his emotional/non-hunger eating behaviors.  Orders and follow up as documented in patient record.   - buPROPion (WELLBUTRIN XL) 150 MG 24 hr tablet; Take 1 tablet (150 mg total) by mouth daily.  Dispense: 30 tablet; Refill: 0  3. Vitamin D deficiency Low Vitamin D level contributes to fatigue and are associated with obesity, breast, and colon cancer. He agrees to continue to take prescription Vitamin D @50 ,000 IU every week and will follow-up for routine testing of Vitamin D, at least 2-3 times per year to avoid over-replacement.  - Vitamin D, Ergocalciferol, (DRISDOL) 1.25 MG (50000 UNIT) CAPS capsule; Take 1 capsule (50,000 Units total) by mouth every 7 (seven) days.  Dispense: 4 capsule; Refill: 0  4. Class 3 severe obesity with serious comorbidity and body mass index (BMI) of 50.0 to 59.9 in adult, unspecified obesity type James [name] is currently in the action stage of change. As such, his goal is to continue with weight loss efforts. He has agreed to the Category 4 Plan.   Exercise goals: As is  Behavioral modification strategies: increasing lean protein intake, meal planning and cooking strategies, keeping healthy foods in the home and planning  for success.  James [name] has agreed to follow-up with our clinic in 2-3 weeks. He was informed of the importance of frequent follow-up visits to maximize his success with intensive lifestyle modifications for his multiple health conditions.   Objective:   Blood pressure (!) 143/81, pulse 70, temperature 97.7 F (36.5 C), height 5\' 4"  (1.626 m), weight (!) 310 lb (140.6 kg), SpO2 100 %. Body mass index is 53.21  kg/m.  General: Cooperative, alert, well developed, in no acute distress. HEENT: Conjunctivae and lids unremarkable. Cardiovascular: Regular rhythm.  Lungs: Normal work of breathing. Neurologic: No focal deficits.   Lab Results  Component Value Date   CREATININE 0.97 06/23/2020   BUN 18 06/23/2020   NA 137 06/23/2020   K 4.3 06/23/2020   CL 96 06/23/2020   CO2 25 06/23/2020   Lab Results  Component Value Date   ALT 27 06/23/2020   AST 20 06/23/2020   ALKPHOS 79 06/23/2020   BILITOT 0.4 06/23/2020   Lab Results  Component Value Date   HGBA1C 5.8 (H) 06/23/2020   HGBA1C 5.7 (H) 01/19/2020   HGBA1C 5.8 10/15/2019   HGBA1C 5.8 01/13/2019   HGBA1C 5.6 10/23/2017   Lab Results  Component Value Date   INSULIN 10.7 06/23/2020   INSULIN 23.5 02/08/2020   INSULIN 13.4 04/10/2017   INSULIN 25.3 (H) 01/01/2017   INSULIN 17.2 09/04/2016   Lab Results  Component Value Date   TSH 2.34 01/19/2020   Lab Results  Component Value Date   CHOL 204 (H) 06/23/2020   HDL 52 06/23/2020   LDLCALC 137 (H) 06/23/2020   TRIG 86 06/23/2020   CHOLHDL 3.6 01/19/2020   Lab Results  Component Value Date   WBC 7.1 01/19/2020   HGB 14.5 01/19/2020   HCT 44.1 01/19/2020   MCV 89.3 01/19/2020   PLT 208 01/19/2020   No results found for: IRON, TIBC, FERRITIN  Obesity Behavioral Intervention:   Approximately 15 minutes were spent on the discussion below.  ASK: We discussed the diagnosis of obesity with James [name] today and James [name] agreed to give [name] permission to discuss obesity behavioral modification therapy today.  ASSESS: James [name] has the diagnosis of obesity and his BMI today is 53.2. James [name] is in the action stage of change.   ADVISE: James [name] was educated on the multiple health risks of obesity as well as the benefit of weight loss to improve his health. He was advised of the need for long term treatment and the importance of lifestyle modifications to improve his current health and to  decrease his risk of future health problems.  AGREE: Multiple dietary modification options and treatment options were discussed and James [name] agreed to follow the recommendations documented in the above note.  ARRANGE: James [name] was educated on the importance of frequent visits to treat obesity as outlined per CMS and USPSTF guidelines and agreed to schedule his next follow up appointment today.  Attestation Statements:   Reviewed by clinician on day of visit: allergies, medications, problem list, medical history, surgical history, family history, social history, and previous encounter notes.  Coral Ceo, am acting as Location manager for Mina Marble, NP.  I have reviewed the above documentation for accuracy and completeness, and I agree with the above. -  Marchell Froman d. Margeaux Swantek, NP-C

## 2020-08-25 ENCOUNTER — Encounter: Payer: Self-pay | Admitting: Adult Health

## 2020-08-25 ENCOUNTER — Telehealth (INDEPENDENT_AMBULATORY_CARE_PROVIDER_SITE_OTHER): Payer: PPO | Admitting: Adult Health

## 2020-08-25 ENCOUNTER — Telehealth (INDEPENDENT_AMBULATORY_CARE_PROVIDER_SITE_OTHER): Payer: PPO | Admitting: Psychology

## 2020-08-25 VITALS — BP 126/78 | HR 68 | Wt 310.0 lb

## 2020-08-25 DIAGNOSIS — G4730 Sleep apnea, unspecified: Secondary | ICD-10-CM

## 2020-08-25 DIAGNOSIS — F3289 Other specified depressive episodes: Secondary | ICD-10-CM

## 2020-08-25 NOTE — Progress Notes (Signed)
Virtual Visit via Video Note  I connected with Lilia Pro on 08/25/20 at  4:00 PM EDT by a video enabled telemedicine application and verified that I am speaking with the correct person using two identifiers.  Location patient: home Location provider:work or home office Persons participating in the virtual visit: patient, provider  I discussed the limitations of evaluation and management by telemedicine and the availability of in person appointments. The patient expressed understanding and agreed to proceed.   HPI: 65 year old male who is being evaluated today for concern of sleep apnea.  He reports that for least the last 15 to 20 years his wife has told him that she notices that he will stop breathing for short periods of time when he is sleeping.  Patient reports that he does snore, feels fatigued when he wakes up in all almost always take a nap during the afternoon  Also reports that his apple watch has noticed that he is only sleeping for an hour and 1/2 to 1-hour and 45 minutes each night.  Wt Readings from Last 3 Encounters:  08/25/20 (!) 310 lb (140.6 kg)  08/15/20 (!) 310 lb (140.6 kg)  07/28/20 (!) 315 lb (142.9 kg)    ROS: See pertinent positives and negatives per HPI.  Past Medical History:  Diagnosis Date  . Arthritis   . Bilateral swelling of feet   . Chicken pox   . Complication of anesthesia    difficulty waking up  . Depression   . Hypertension   . Joint pain   . Obesity   . Osteoarthritis   . Prediabetes   . Shortness of breath   . Swelling    feet and legs  . Vitamin D deficiency     Past Surgical History:  Procedure Laterality Date  . cartilage removal    . COLONOSCOPY WITH PROPOFOL N/A 12/14/2019   Procedure: COLONOSCOPY WITH PROPOFOL;  Surgeon: Doran Stabler, MD;  Location: [facility];  Service: Gastroenterology;  Laterality: N/A;  . KNEE SURGERY  2000  . POLYPECTOMY  12/14/2019   Procedure: POLYPECTOMY;  Surgeon: Doran Stabler, MD;   Location: [facility];  Service: Gastroenterology;;  . TOTAL HIP ARTHROPLASTY Left 02/11/2019   Procedure: TOTAL HIP [place];  Surgeon: Gaynelle Arabian, MD;  Location: [facility];  Service: Orthopedics;  Laterality: Left;  158min    Family History  Problem Relation Age of Onset  . Cancer Mother        lung and soft tissue cancer from radiation  . Stroke Mother   . Obesity Mother   . Heart disease Father        CHF  . Diabetes Father   . Hypertension Father   . Obesity Father   . Heart disease Maternal Grandfather   . Diabetes Paternal Grandmother   . Ovarian cancer Sister   . Colon cancer Neg Hx   . Colon polyps Neg Hx   . Esophageal cancer Neg Hx   . Rectal cancer Neg Hx   . Stomach cancer Neg Hx        Current Outpatient Medications:  .  acetaminophen (TYLENOL) 500 MG tablet, Take 1,000 mg by mouth at bedtime as needed for moderate pain., Disp: , Rfl:  .  amoxicillin (AMOXIL) 500 MG capsule, Take 2,000 mg by mouth See admin instructions. Take 2000 mg 1 hour prior to dental work, Disp: , Rfl:  .  buPROPion (WELLBUTRIN XL) 150 MG 24 hr tablet, Take 1 tablet (150 mg  total) by mouth daily., Disp: 30 tablet, Rfl: 0 .  cetirizine (ZYRTEC) 10 MG tablet, Take 10 mg by mouth daily., Disp: , Rfl:  .  fluticasone (FLONASE) 50 MCG/ACT nasal spray, Place 1 spray into both nostrils daily. , Disp: , Rfl:  .  hydrochlorothiazide (HYDRODIURIL) 25 MG tablet, Take 1 tablet (25 mg total) by mouth daily., Disp: 90 tablet, Rfl: 1 .  ibuprofen (ADVIL) 200 MG tablet, Take 400 mg by mouth at bedtime as needed for moderate pain. , Disp: , Rfl:  .  Ketotifen Fumarate (ALLERGY EYE DROPS OP), Place 1 drop into both eyes daily as needed (allergies)., Disp: , Rfl:  .  lidocaine (LMX) 4 % cream, Apply 1 application topically daily as needed (pain)., Disp: , Rfl:  .  lisinopril (ZESTRIL) 10 MG tablet, TAKE 2 TABLETS BY MOUTH EVERY DAY, Disp: 180 tablet, Rfl: 1 .  Multiple Vitamin (MULTIVITAMIN)  tablet, Take 1 tablet by mouth daily., Disp: , Rfl:  .  Vitamin D, Ergocalciferol, (DRISDOL) 1.25 MG (50000 UNIT) CAPS capsule, Take 1 capsule (50,000 Units total) by mouth every 7 (seven) days., Disp: 4 capsule, Rfl: 0  EXAM:  VITALS per patient if applicable:  GENERAL: alert, oriented, appears well and in no acute distress  HEENT: atraumatic, conjunttiva clear, no obvious abnormalities on inspection of external nose and ears  NECK: normal movements of the head and neck  LUNGS: on inspection no signs of respiratory distress, breathing rate appears normal, no obvious gross SOB, gasping or wheezing  CV: no obvious cyanosis  MS: moves all visible extremities without noticeable abnormality  PSYCH/NEURO: pleasant and cooperative, no obvious depression or anxiety, speech and thought processing grossly intact  ASSESSMENT AND PLAN:  Discussed the following assessment and plan:  1. Sleep apnea, unspecified type - Ambulatory referral to Pulmonology     I discussed the assessment and treatment plan with the patient. The patient was provided an opportunity to ask questions and all were answered. The patient agreed with the plan and demonstrated an understanding of the instructions.   The patient was advised to call back or seek an in-person evaluation if the symptoms worsen or if the condition fails to improve as anticipated.   Dorothyann Peng, NP

## 2020-08-29 ENCOUNTER — Ambulatory Visit (INDEPENDENT_AMBULATORY_CARE_PROVIDER_SITE_OTHER): Payer: PPO | Admitting: Psychologist

## 2020-08-29 DIAGNOSIS — F321 Major depressive disorder, single episode, moderate: Secondary | ICD-10-CM | POA: Diagnosis not present

## 2020-09-07 ENCOUNTER — Encounter (INDEPENDENT_AMBULATORY_CARE_PROVIDER_SITE_OTHER): Payer: Self-pay | Admitting: Adult Health

## 2020-09-07 ENCOUNTER — Other Ambulatory Visit: Payer: Self-pay

## 2020-09-07 ENCOUNTER — Ambulatory Visit (INDEPENDENT_AMBULATORY_CARE_PROVIDER_SITE_OTHER): Payer: PPO | Admitting: Adult Health

## 2020-09-07 VITALS — BP 130/76 | HR 61 | Temp 97.5°F | Ht 64.0 in | Wt 311.0 lb

## 2020-09-07 DIAGNOSIS — Z6841 Body Mass Index (BMI) 40.0 and over, adult: Secondary | ICD-10-CM

## 2020-09-07 DIAGNOSIS — E559 Vitamin D deficiency, unspecified: Secondary | ICD-10-CM | POA: Diagnosis not present

## 2020-09-07 DIAGNOSIS — F3289 Other specified depressive episodes: Secondary | ICD-10-CM | POA: Diagnosis not present

## 2020-09-07 MED ORDER — VITAMIN D (ERGOCALCIFEROL) 1.25 MG (50000 UNIT) PO CAPS: 50000.0000 [IU] | ORAL_CAPSULE | ORAL | 0 refills | Status: DC

## 2020-09-07 MED ORDER — BUPROPION HCL ER (XL) 150 MG PO TB24: 150.0000 mg | ORAL_TABLET | Freq: Every day | ORAL | 0 refills | Status: DC

## 2020-09-08 DIAGNOSIS — F32A Depression, unspecified: Secondary | ICD-10-CM | POA: Insufficient documentation

## 2020-09-08 NOTE — Progress Notes (Signed)
Chief Complaint:   OBESITY James [name] is here to discuss his progress with his obesity treatment plan along with follow-up of his obesity related diagnoses. James [name] is on the Category 4 Plan and states he is following his eating plan approximately 90% of the time. James [name] states he is doing yard work 45 minutes 4 times per week.  Today's visit was #: 12 Starting weight: 332 lbs Starting date: 02/08/2020 Today's weight: 311 lbs Today's date: 09/07/2020 Total lbs lost to date: 21 lbs Total lbs lost since last in-office visit: 0  Interim History: James [name] visited his son in [place] for 4 days and ate out off plan and only gained 1 lb.  He is using a push mower to complete yard work. He has been able to increase time of mowing from initial of 30 mins, to now able tomow for 60 mins.  Subjective:   1. Vitamin D deficiency James [name]'s Vitamin D level was 46.5 on 06/23/2020. He is currently taking prescription vitamin D 50,000 IU each week. He denies nausea, vomiting or muscle weakness.  2. Other depression, with emotional eating BP/HR stable at OV. James [name] denies h/o seizures. Pt denies suicidal or homicidal ideations. He is on bupropion XL 150 mg QD.  He was previously on sertraline, which didn't help mood.  Assessment/Plan:   1. Vitamin D deficiency Low Vitamin D level contributes to fatigue and are associated with obesity, breast, and colon cancer. He agrees to continue to take prescription Vitamin D @50 ,000 IU every week and will follow-up for routine testing of Vitamin D, at least 2-3 times per year to avoid over-replacement.  - Vitamin D, Ergocalciferol, (DRISDOL) 1.25 MG (50000 UNIT) CAPS capsule; Take 1 capsule (50,000 Units total) by mouth every 7 (seven) days.  Dispense: 4 capsule; Refill: 0  2. Other depression, with emotional eating Behavior modification techniques were discussed today to help James [name] deal with his emotional/non-hunger eating behaviors.  Orders and follow up as documented in  patient record.   - buPROPion (WELLBUTRIN XL) 150 MG 24 hr tablet; Take 1 tablet (150 mg total) by mouth daily.  Dispense: 30 tablet; Refill: 0  3. Class 3 severe obesity with serious comorbidity and body mass index (BMI) of 50.0 to 59.9 in adult, unspecified obesity type [facility])  James [name] is currently in the action stage of change. As such, his goal is to continue with weight loss efforts. He has agreed to the Category 4 Plan.   Exercise goals: As is  Behavioral modification strategies: increasing lean protein intake, decreasing simple carbohydrates, meal planning and cooking strategies, keeping healthy foods in the home and planning for success.  James [name] has agreed to follow-up with our clinic in 2 weeks. He was informed of the importance of frequent follow-up visits to maximize his success with intensive lifestyle modifications for his multiple health conditions.   Objective:   Blood pressure 130/76, pulse 61, temperature (!) 97.5 F (36.4 C), height 5\' 4"  (1.626 m), weight (!) 311 lb (141.1 kg), SpO2 97 %. Body mass index is 53.38 kg/m.  General: Cooperative, alert, well developed, in no acute distress. HEENT: Conjunctivae and lids unremarkable. Cardiovascular: Regular rhythm.  Lungs: Normal work of breathing. Neurologic: No focal deficits.   Lab Results  Component Value Date   CREATININE 0.97 06/23/2020   BUN 18 06/23/2020   NA 137 06/23/2020   K 4.3 06/23/2020   CL 96 06/23/2020   CO2 25 06/23/2020   Lab Results  Component Value Date   ALT  27 06/23/2020   AST 20 06/23/2020   ALKPHOS 79 06/23/2020   BILITOT 0.4 06/23/2020   Lab Results  Component Value Date   HGBA1C 5.8 (H) 06/23/2020   HGBA1C 5.7 (H) 01/19/2020   HGBA1C 5.8 10/15/2019   HGBA1C 5.8 01/13/2019   HGBA1C 5.6 10/23/2017   Lab Results  Component Value Date   INSULIN 10.7 06/23/2020   INSULIN 23.5 02/08/2020   INSULIN 13.4 04/10/2017   INSULIN 25.3 (H) 01/01/2017   INSULIN 17.2 09/04/2016   Lab  Results  Component Value Date   TSH 2.34 01/19/2020   Lab Results  Component Value Date   CHOL 204 (H) 06/23/2020   HDL 52 06/23/2020   LDLCALC 137 (H) 06/23/2020   TRIG 86 06/23/2020   CHOLHDL 3.6 01/19/2020   Lab Results  Component Value Date   WBC 7.1 01/19/2020   HGB 14.5 01/19/2020   HCT 44.1 01/19/2020   MCV 89.3 01/19/2020   PLT 208 01/19/2020   No results found for: IRON, TIBC, FERRITIN  Obesity Behavioral Intervention:   Approximately 15 minutes were spent on the discussion below.  ASK: We discussed the diagnosis of obesity with James [name] today and James [name] agreed to give [name] permission to discuss obesity behavioral modification therapy today.  ASSESS: Cataldo has the diagnosis of obesity and his BMI today is 53.4. James [name] is in the action stage of change.   ADVISE: James [name] was educated on the multiple health risks of obesity as well as the benefit of weight loss to improve his health. He was advised of the need for long term treatment and the importance of lifestyle modifications to improve his current health and to decrease his risk of future health problems.  AGREE: Multiple dietary modification options and treatment options were discussed and James [name] agreed to follow the recommendations documented in the above note.  ARRANGE: James [name] was educated on the importance of frequent visits to treat obesity as outlined per CMS and USPSTF guidelines and agreed to schedule his next follow up appointment today.  Attestation Statements:   Reviewed by clinician on day of visit: allergies, medications, problem list, medical history, surgical history, family history, social history, and previous encounter notes.  Coral Ceo, CMA, am acting as transcriptionist for Mina Marble, NP.  I have reviewed the above documentation for accuracy and completeness, and I agree with the above. -  Emiline Mancebo d. Aniesa Boback, NP-C

## 2020-09-12 ENCOUNTER — Ambulatory Visit (INDEPENDENT_AMBULATORY_CARE_PROVIDER_SITE_OTHER): Payer: PPO | Admitting: Psychologist

## 2020-09-12 DIAGNOSIS — F321 Major depressive disorder, single episode, moderate: Secondary | ICD-10-CM | POA: Diagnosis not present

## 2020-09-22 ENCOUNTER — Encounter (INDEPENDENT_AMBULATORY_CARE_PROVIDER_SITE_OTHER): Payer: Self-pay | Admitting: Physician Assistant

## 2020-09-22 ENCOUNTER — Ambulatory Visit (INDEPENDENT_AMBULATORY_CARE_PROVIDER_SITE_OTHER): Payer: PPO | Admitting: Physician Assistant

## 2020-09-22 ENCOUNTER — Other Ambulatory Visit: Payer: Self-pay

## 2020-09-22 VITALS — BP 134/76 | HR 50 | Temp 97.6°F | Ht 64.0 in | Wt 311.0 lb

## 2020-09-22 DIAGNOSIS — R7303 Prediabetes: Secondary | ICD-10-CM | POA: Diagnosis not present

## 2020-09-22 DIAGNOSIS — E7849 Other hyperlipidemia: Secondary | ICD-10-CM

## 2020-09-22 DIAGNOSIS — E559 Vitamin D deficiency, unspecified: Secondary | ICD-10-CM

## 2020-09-22 DIAGNOSIS — Z6841 Body Mass Index (BMI) 40.0 and over, adult: Secondary | ICD-10-CM

## 2020-09-22 MED ORDER — METFORMIN HCL 500 MG PO TABS: ORAL_TABLET | ORAL | 0 refills | Status: DC

## 2020-09-22 MED ORDER — VITAMIN D (ERGOCALCIFEROL) 1.25 MG (50000 UNIT) PO CAPS: 50000.0000 [IU] | ORAL_CAPSULE | ORAL | 0 refills | Status: DC

## 2020-09-22 NOTE — Progress Notes (Signed)
Chief Complaint:   OBESITY James [name] is here to discuss his progress with his obesity treatment plan along with follow-up of his obesity related diagnoses. James [name] is on the Category 4 Plan and states he is following his eating plan approximately 80% of the time. James [name] states he is increasing activity, doing yardwork, and walking for 30-40 minutes 3 times per week.  Today's visit was #: 41 Starting weight: 332 lbs Starting date: 02/08/2020 Today's weight: 311 lbs Today's date: 09/22/2020 Total lbs lost to date: 21 Total lbs lost since last in-office visit: 0  Interim History: James [name] reports that he has been off the plan due to his wife's medical issues. He is her caregiver. He is eating out more often, especially fast food.  Subjective:   1. Vitamin D deficiency James [name] is on Vit D weekly, and he is tolerating it well.  2. Other hyperlipidemia James [name] is not on medications, and he denies chest pain. He is walking 3 times per week.  3. Pre-diabetes James [name]'s last A1c was 5.8, and he is not on medications.  Assessment/Plan:   1. Vitamin D deficiency Low Vitamin D level contributes to fatigue and are associated with obesity, breast, and colon cancer. We will refill prescription Vitamin D for 1 month. We will check labs today, and James [name] will follow-up for routine testing of Vitamin D, at least 2-3 times per year to avoid over-replacement.  - Vitamin D, Ergocalciferol, (DRISDOL) 1.25 MG (50000 UNIT) CAPS capsule; Take 1 capsule (50,000 Units total) by mouth every 7 (seven) days.  Dispense: 4 capsule; Refill: 0 - VITAMIN D 25 Hydroxy (Vit-D Deficiency, Fractures)  2. Other hyperlipidemia Cardiovascular risk and specific lipid/LDL goals reviewed. We discussed several lifestyle modifications today. We will check labs today. James [name] will continue with exercise and weight loss efforts. Orders and follow up as documented in patient record.   Counseling Intensive lifestyle modifications are the  first line treatment for this issue. . Dietary changes: Increase soluble fiber. Decrease simple carbohydrates. . Exercise changes: Moderate to vigorous-intensity aerobic activity 150 minutes per week if tolerated. . Lipid-lowering medications: see documented in medical record.  - Lipid panel  3. Pre-diabetes James [name] agreed to start metformin 500 mg q daily with lunch, with no refills. We will check labs today. He will continue to work on weight loss, exercise, and decreasing simple carbohydrates to help decrease the risk of diabetes.   - metFORMIN (GLUCOPHAGE) 500 MG tablet; One tablet daily with lunch  Dispense: 30 tablet; Refill: 0 - Comprehensive metabolic panel - Hemoglobin A1c - Insulin, random  4. Class 3 severe obesity with serious comorbidity and body mass index (BMI) of 50.0 to 59.9 in adult, unspecified obesity type James [name]) James [name] is currently in the action stage of change. As such, his goal is to continue with weight loss efforts. He has agreed to the Category 4 Plan and keeping a food journal and adhering to recommended goals of 550-700 calories and 45 grams of protein at supper daily.   Eating Out sheet was given today.  Exercise goals: As is.  Behavioral modification strategies: decreasing eating out and meal planning and cooking strategies.  James [name] has agreed to follow-up with our clinic in 2 to 3 weeks. He was informed of the importance of frequent follow-up visits to maximize his success with intensive lifestyle modifications for his multiple health conditions.   James [name] was informed we would discuss his lab results at his next visit unless there is a critical issue that needs  to be addressed sooner. James [name] agreed to keep his next visit at the agreed upon time to discuss these results.  Objective:   Blood pressure 134/76, pulse (!) 50, temperature 97.6 F (36.4 C), temperature source Oral, height 5\' 4"  (1.626 m), weight (!) 311 lb (141.1 kg), SpO2 97 %. Body mass index is  53.38 kg/m.  General: Cooperative, alert, well developed, in no acute distress. HEENT: Conjunctivae and lids unremarkable. Cardiovascular: Regular rhythm.  Lungs: Normal work of breathing. Neurologic: No focal deficits.   Lab Results  Component Value Date   CREATININE 0.97 06/23/2020   BUN 18 06/23/2020   NA 137 06/23/2020   K 4.3 06/23/2020   CL 96 06/23/2020   CO2 25 06/23/2020   Lab Results  Component Value Date   ALT 27 06/23/2020   AST 20 06/23/2020   ALKPHOS 79 06/23/2020   BILITOT 0.4 06/23/2020   Lab Results  Component Value Date   HGBA1C 5.8 (H) 06/23/2020   HGBA1C 5.7 (H) 01/19/2020   HGBA1C 5.8 10/15/2019   HGBA1C 5.8 01/13/2019   HGBA1C 5.6 10/23/2017   Lab Results  Component Value Date   INSULIN 10.7 06/23/2020   INSULIN 23.5 02/08/2020   INSULIN 13.4 04/10/2017   INSULIN 25.3 (H) 01/01/2017   INSULIN 17.2 09/04/2016   Lab Results  Component Value Date   TSH 2.34 01/19/2020   Lab Results  Component Value Date   CHOL 204 (H) 06/23/2020   HDL 52 06/23/2020   LDLCALC 137 (H) 06/23/2020   TRIG 86 06/23/2020   CHOLHDL 3.6 01/19/2020   Lab Results  Component Value Date   WBC 7.1 01/19/2020   HGB 14.5 01/19/2020   HCT 44.1 01/19/2020   MCV 89.3 01/19/2020   PLT 208 01/19/2020   No results found for: IRON, TIBC, FERRITIN  Obesity Behavioral Intervention:   Approximately 15 minutes were spent on the discussion below.  ASK: We discussed the diagnosis of obesity with James [name] today and James [name] agreed to give [name] permission to discuss obesity behavioral modification therapy today.  ASSESS: James [name] has the diagnosis of obesity and his BMI today is 53.36. James [name] is in the action stage of change.   ADVISE: James [name] was educated on the multiple health risks of obesity as well as the benefit of weight loss to improve his health. He was advised of the need for long term treatment and the importance of lifestyle modifications to improve his current health and  to decrease his risk of future health problems.  AGREE: Multiple dietary modification options and treatment options were discussed and Dontez agreed to follow the recommendations documented in the above note.  ARRANGE: Yitzhak was educated on the importance of frequent visits to treat obesity as outlined per CMS and USPSTF guidelines and agreed to schedule his next follow up appointment today.  Attestation Statements:   Reviewed by clinician on day of visit: allergies, medications, problem list, medical history, surgical history, family history, social history, and previous encounter notes.   Wilhemena Durie, am acting as transcriptionist for [employer], PA-C.  I have reviewed the above documentation for accuracy and completeness, and I agree with the above. Abby Potash, PA-C

## 2020-09-23 LAB — COMPREHENSIVE METABOLIC PANEL
ALT: 26 IU/L (ref 0–44)
AST: 18 IU/L (ref 0–40)
Albumin/Globulin Ratio: 1.8 (ref 1.2–2.2)
Albumin: 4.3 g/dL (ref 3.8–4.8)
Alkaline Phosphatase: 76 IU/L (ref 44–121)
BUN/Creatinine Ratio: 18 (ref 10–24)
BUN: 17 mg/dL (ref 8–27)
Bilirubin Total: 0.4 mg/dL (ref 0.0–1.2)
CO2: 28 mmol/L (ref 20–29)
Calcium: 9 mg/dL (ref 8.6–10.2)
Chloride: 98 mmol/L (ref 96–106)
Creatinine, Ser: 0.96 mg/dL (ref 0.76–1.27)
Globulin, Total: 2.4 g/dL (ref 1.5–4.5)
Glucose: 96 mg/dL (ref 65–99)
Potassium: 4.5 mmol/L (ref 3.5–5.2)
Sodium: 140 mmol/L (ref 134–144)
Total Protein: 6.7 g/dL (ref 6.0–8.5)
eGFR: 87 mL/min/{1.73_m2} (ref >59)

## 2020-09-23 LAB — LIPID PANEL
Chol/HDL Ratio: 3.5 ratio (ref 0.0–5.0)
Cholesterol, Total: 175 mg/dL (ref 100–199)
HDL: 50 mg/dL (ref >39)
LDL Chol Calc (NIH): 113 mg/dL — ABNORMAL HIGH (ref 0–99)
Triglycerides: 61 mg/dL (ref 0–149)
VLDL Cholesterol Cal: 12 mg/dL (ref 5–40)

## 2020-09-23 LAB — INSULIN, RANDOM: INSULIN: 14 u[IU]/mL (ref 2.6–24.9)

## 2020-09-23 LAB — HEMOGLOBIN A1C
Est. average glucose Bld gHb Est-mCnc: 120 mg/dL
Hgb A1c MFr Bld: 5.8 % — ABNORMAL HIGH (ref 4.8–5.6)

## 2020-09-23 LAB — VITAMIN D 25 HYDROXY (VIT D DEFICIENCY, FRACTURES): Vit D, 25-Hydroxy: 63.2 ng/mL (ref 30.0–100.0)

## 2020-09-26 ENCOUNTER — Ambulatory Visit (INDEPENDENT_AMBULATORY_CARE_PROVIDER_SITE_OTHER): Payer: PPO | Admitting: Psychologist

## 2020-09-26 DIAGNOSIS — F321 Major depressive disorder, single episode, moderate: Secondary | ICD-10-CM

## 2020-09-26 IMAGING — DX DG CHEST 2V
2 series · 2 of 2 positions shown · non-contrast
Comparison: None.

CLINICAL DATA: Shortness of breath on exertion 3 months getting
worse.

EXAM:
CHEST - 2 VIEW

[chest pa]
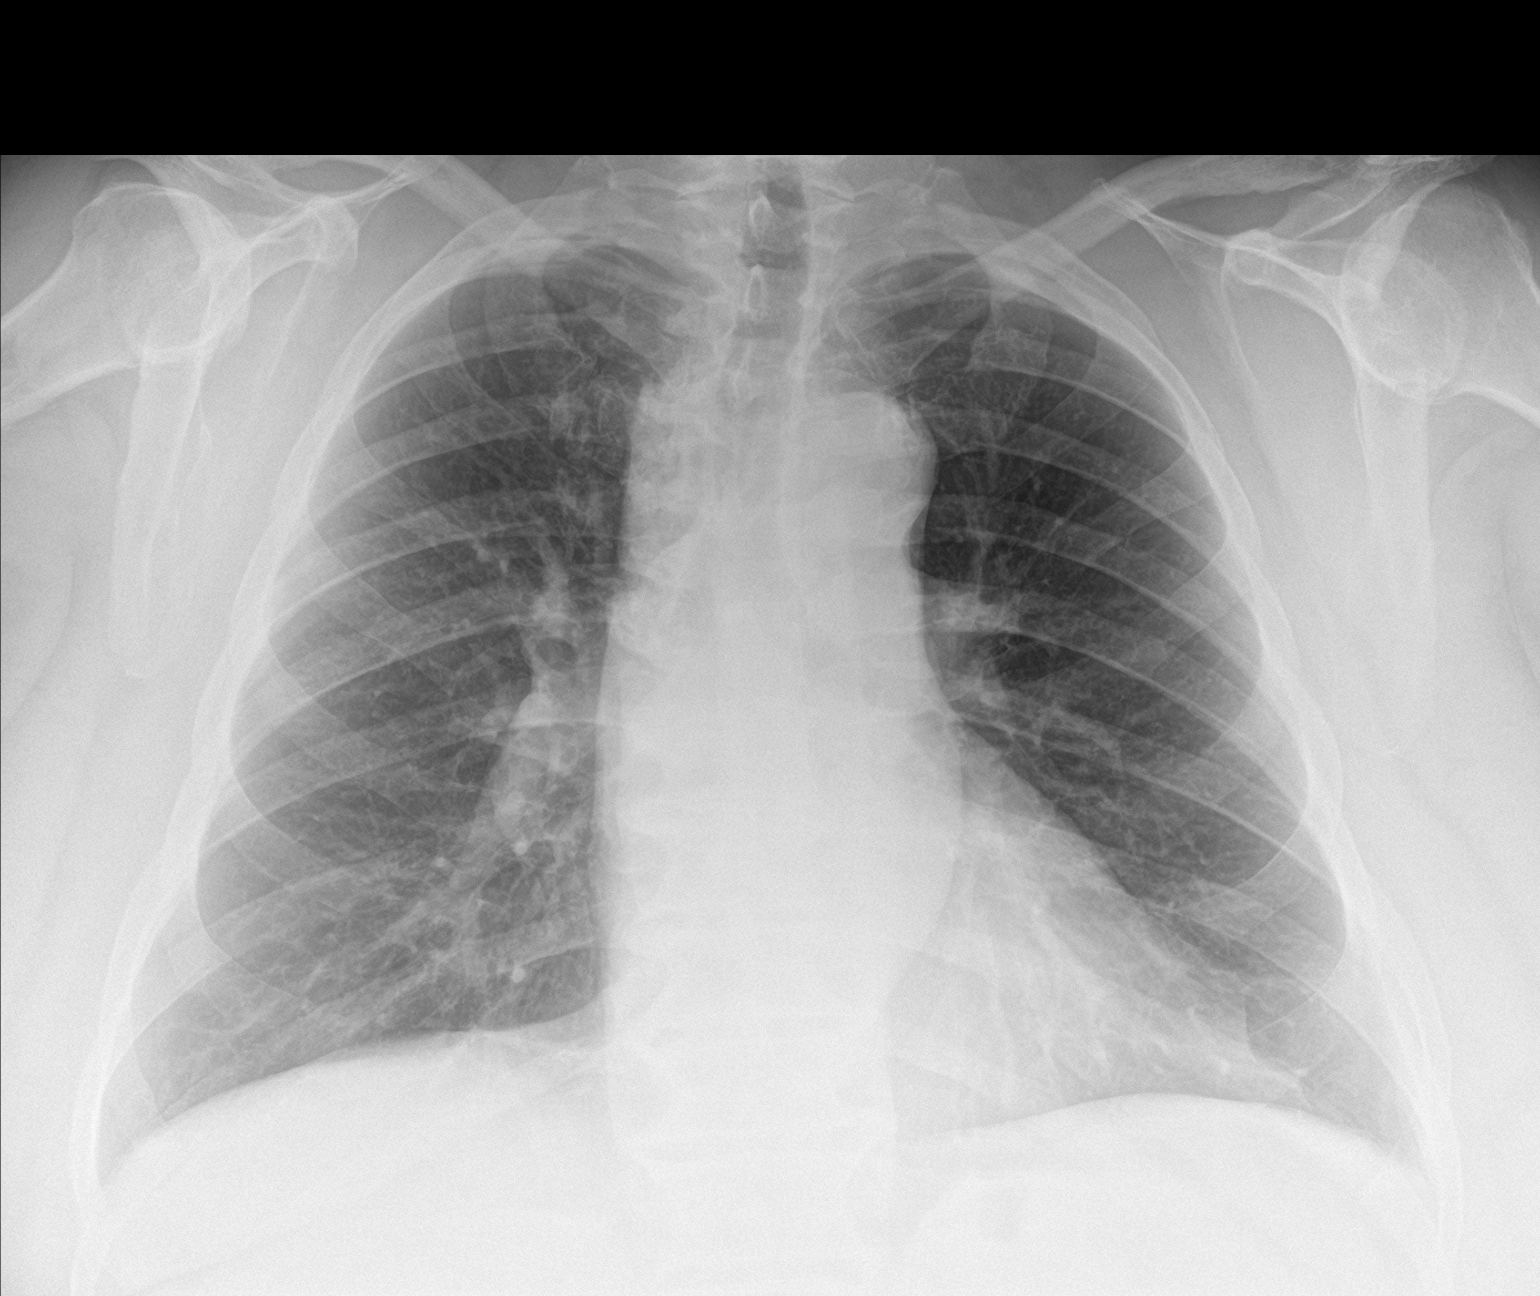

[chest lat]
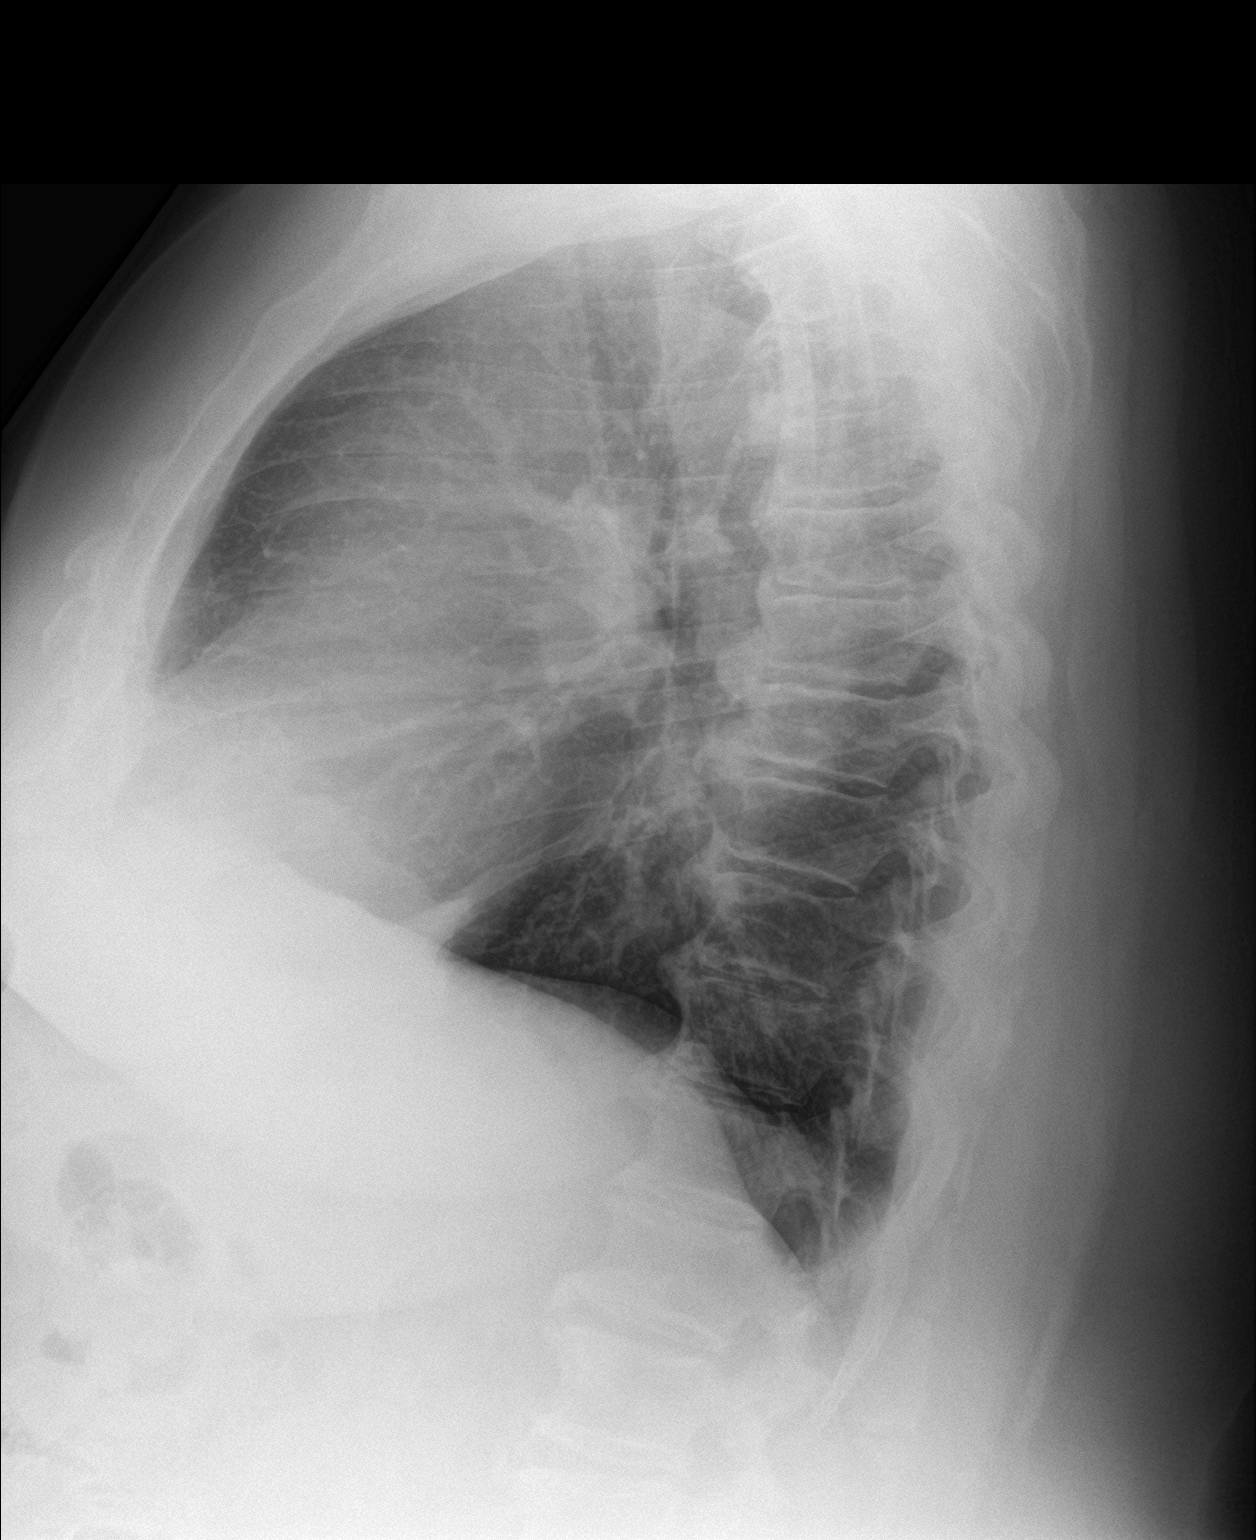

[2 of 2 positions shown; findings below may reference images not displayed]

FINDINGS: Lungs are adequately inflated without consolidation or effusion.
Cardiomediastinal silhouette is normal. Moderate degenerative change
of the spine.
IMPRESSION: No active cardiopulmonary disease.

## 2020-09-27 ENCOUNTER — Other Ambulatory Visit: Payer: Self-pay

## 2020-09-27 ENCOUNTER — Ambulatory Visit: Payer: PPO | Admitting: Pulmonary Disease

## 2020-09-27 ENCOUNTER — Encounter: Payer: Self-pay | Admitting: Pulmonary Disease

## 2020-09-27 VITALS — BP 122/82 | HR 81 | Temp 97.3°F | Ht 65.0 in | Wt 314.3 lb

## 2020-09-27 DIAGNOSIS — R0683 Snoring: Secondary | ICD-10-CM

## 2020-09-27 NOTE — Progress Notes (Signed)
home

## 2020-09-27 NOTE — Progress Notes (Signed)
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 so, did not want to go to spend the night in the sleep lab Inquiring about having a home sleep study performed to diagnose the sleep apnea and possible treatment  He does admit to a history of snoring Multiple awakenings Daytime sleepiness  Usually goes to bed between 1030 and 11 Falls asleep in 10 to 30 minutes 2-5 awakenings Final wake up time between 730 and 830  Sleep is not usually restorative She also tosses and turns a lot during sleep  Weight is down a little bit recently about about 20 pounds  Both parents snored  Reformed smoker-quit over 25 years ago  Admits to dryness of his mouth in the mornings No morning headaches Memory is good Able to drive safely  Outpatient Encounter Medications as of 09/27/2020  Medication Sig  . acetaminophen (TYLENOL) 500 MG tablet Take 1,000 mg by mouth at bedtime as needed for moderate pain.  Marland Kitchen amoxicillin (AMOXIL) 500 MG capsule Take 2,000 mg by mouth See admin instructions. Take 2000 mg 1 hour prior to dental work  . buPROPion (WELLBUTRIN XL) 150 MG 24 hr tablet Take 1 tablet (150 mg total) by mouth daily.  . cetirizine (ZYRTEC) 10 MG tablet Take 10 mg by mouth daily.  . fluticasone (FLONASE) 50 MCG/ACT nasal spray Place 1 spray into both nostrils daily.   . hydrochlorothiazide (HYDRODIURIL) 25 MG tablet Take 1 tablet (25 mg total) by mouth daily.  Marland Kitchen ibuprofen (ADVIL) 200 MG tablet Take 400 mg by mouth at bedtime as needed for moderate pain.   Marland Kitchen Ketotifen Fumarate (ALLERGY EYE DROPS OP) Place 1 drop into both eyes daily as needed (allergies).  Marland Kitchen lidocaine (LMX) 4 % cream Apply 1 application topically daily as needed (pain).  Marland Kitchen lisinopril (ZESTRIL)  10 MG tablet TAKE 2 TABLETS BY MOUTH EVERY DAY  . metFORMIN (GLUCOPHAGE) 500 MG tablet One tablet daily with lunch  . Multiple Vitamin (MULTIVITAMIN) tablet Take 1 tablet by mouth daily.  . Vitamin D, Ergocalciferol, (DRISDOL) 1.25 MG (50000 UNIT) CAPS capsule Take 1 capsule (50,000 Units total) by mouth every 7 (seven) days.   No facility-administered encounter medications on file as of 09/27/2020.    Allergies as of 09/27/2020  . (No Active Allergies)    Past Medical History:  Diagnosis Date  . Arthritis   . Bilateral swelling of feet   . Chicken pox   . Complication of anesthesia    difficulty waking up  . Depression   . Hypertension   . Joint pain   . Obesity   . Osteoarthritis   . Prediabetes   . Shortness of breath   . Swelling    feet and legs  . Vitamin D deficiency     Past Surgical History:  Procedure Laterality Date  . cartilage removal    . COLONOSCOPY WITH PROPOFOL N/A 12/14/2019   Procedure: COLONOSCOPY WITH PROPOFOL;  Surgeon: Doran Stabler, MD;  Location: [facility];  Service: Gastroenterology;  Laterality: N/A;  . KNEE SURGERY  2000  . POLYPECTOMY  12/14/2019   Procedure: POLYPECTOMY;  Surgeon: Doran Stabler, MD;  Location: [facility];  Service: Gastroenterology;;  . TOTAL  HIP ARTHROPLASTY Left 02/11/2019   Procedure: TOTAL HIP ARTHROPLASTY-POSTERIOR;  Surgeon: Gaynelle Arabian, MD;  Location: [facility];  Service: Orthopedics;  Laterality: Left;  146min    Family History  Problem Relation Age of Onset  . Cancer Mother        lung and soft tissue cancer from radiation  . Stroke Mother   . Obesity Mother   . Heart disease Father        CHF  . Diabetes Father   . Hypertension Father   . Obesity Father   . Heart disease Maternal Grandfather   . Diabetes Paternal Grandmother   . Ovarian cancer Sister   . Colon cancer Neg Hx   . Colon polyps Neg Hx   . Esophageal cancer Neg Hx   . Rectal cancer Neg Hx   . Stomach cancer Neg Hx     Social  History   Socioeconomic History  . Marital status: Married    Spouse name: Not on file  . Number of children: Not on file  . Years of education: Not on file  . Highest education level: Not on file  Occupational History  . Occupation: At home caregiver  Tobacco Use  . Smoking status: Former Smoker    Quit date: 07/16/1992    Years since quitting: 28.2  . Smokeless tobacco: Never Used  Substance and Sexual Activity  . Alcohol use: Yes    Comment: very little per patient   . Drug use: No  . Sexual activity: Not on file  Other Topics Concern  . 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 Difficulty of Paying Living Expenses: Not hard at all  Food Insecurity: No Food Insecurity  . Worried About Charity fundraiser in the Last Year: Never true  . Ran Out of Food in the Last Year: Never true  Transportation Needs: No Transportation Needs  . Lack of Transportation (Medical): No  . Lack of Transportation (Non-Medical): No  Physical Activity: Insufficiently Active  . Days of Exercise per Week: 3 days  . Minutes of Exercise per Session: 30 min  Stress: No Stress Concern Present  . Feeling of Stress : Not at all  Social Connections: Socially Isolated  . Frequency of Communication with Friends and Family: Once a week  . Frequency of Social Gatherings with Friends and Family: Never  . Attends Religious Services: Never  . Active Member of Clubs or Organizations: No  . Attends Archivist Meetings: Never  . Marital Status: Married  Human resources officer Violence: Not At Risk  . Fear of Current or Ex-Partner: No  . Emotionally Abused: No  . Physically Abused: No  .  Sexually Abused: No    Review of Systems  Respiratory: Positive for apnea.   Psychiatric/Behavioral: Positive for sleep disturbance.    Vitals:   09/27/20 1106  BP: 122/82  Pulse: 81  Temp: (!) 97.3 F (36.3 C)  SpO2: 96%     Physical Exam Constitutional:      Appearance: He is obese.  HENT:  Head: Normocephalic.     Nose: Nose normal. No congestion.     Mouth/Throat:     Mouth: Mucous membranes are moist.     Comments: Mallampati 4, crowded oropharynx, macroglossia Eyes:     General:        Right eye: No discharge.        Left eye: No discharge.     Pupils: Pupils are equal, round, and reactive to light.  Cardiovascular:     Rate and Rhythm: Normal rate and regular rhythm.     Heart sounds: No murmur heard. No friction rub.  Pulmonary:     Effort: No respiratory distress.     Breath sounds: No stridor. No wheezing or rhonchi.  Musculoskeletal:     Cervical back: No rigidity or tenderness.  Neurological:     Mental Status: He is alert.  Psychiatric:        Mood and Affect: Mood normal.    Epworth Sleepiness Scale of 16   Assessment:  Moderate to significant probability of significant obstructive sleep apnea  Morbid obesity  Excessive daytime sleepiness  Pathophysiology of sleep disordered breathing reviewed with the patient Treatment options for obstructive sleep apnea discussed with the patient  Plan/Recommendations: I will schedule the patient for a home sleep study  Encouraged about weight loss efforts  Behavioral modifications to help sleep quality discussed  I will follow-up with him in about 3 to 4 months  Encouraged to call with any significant concerns   Sherrilyn Rist MD [facility] 09/27/2020, 11:31 AM  CC: James Peng, NP

## 2020-09-27 NOTE — Patient Instructions (Signed)
Moderate probability of significant obstructive sleep apnea  We will schedule you for home sleep study We will update your results as soon as reviewed  Tentative follow-up in 3 to 4 months  Call with significant concerns  Continue weight loss efforts Sleep Apnea Sleep apnea affects breathing during sleep. It causes breathing to stop for a short time or to become shallow. It can also increase the risk of:  Heart attack.  Stroke.  Being very overweight (obese).  Diabetes.  Heart failure.  Irregular heartbeat. The goal of treatment is to help you breathe normally again. What are the causes? There are three kinds of sleep apnea:  Obstructive sleep apnea. This is caused by a blocked or collapsed airway.  Central sleep apnea. This happens when the brain does not send the right signals to the muscles that control breathing.  Mixed sleep apnea. This is a combination of obstructive and central sleep apnea. The most common cause of this condition is a collapsed or blocked airway. This can happen if:  Your throat muscles are too relaxed.  Your tongue and tonsils are too large.  You are overweight.  Your airway is too small.   What increases the risk?  Being overweight.  Smoking.  Having a small airway.  Being older.  Being male.  Drinking alcohol.  Taking medicines to calm yourself (sedatives or tranquilizers).  Having family members with the condition. What are the signs or symptoms?  Trouble staying asleep.  Being sleepy or tired during the day.  Getting angry a lot.  Loud snoring.  Headaches in the morning.  Not being able to focus your mind (concentrate).  Forgetting things.  Less interest in sex.  Mood swings.  Personality changes.  Feelings of sadness (depression).  Waking up a lot during the night to pee (urinate).  Dry mouth.  Sore throat. How is this diagnosed?  Your medical history.  A physical exam.  A test that is done  when you are sleeping (sleep study). The test is most often done in a sleep lab but may also be done at home. How is this treated?  Sleeping on your side.  Using a medicine to get rid of mucus in your nose (decongestant).  Avoiding the use of alcohol, medicines to help you relax, or certain pain medicines (narcotics).  Losing weight, if needed.  Changing your diet.  Not smoking.  Using a machine to open your airway while you sleep, such as: ? An oral appliance. This is a mouthpiece that shifts your lower jaw forward. ? A CPAP device. This device blows air through a mask when you breathe out (exhale). ? An EPAP device. This has valves that you put in each nostril. ? A BPAP device. This device blows air through a mask when you breathe in (inhale) and breathe out.  Having surgery if other treatments do not work. It is important to get treatment for sleep apnea. Without treatment, it can lead to:  High blood pressure.  Coronary artery disease.  In men, not being able to have an erection (impotence).  Reduced thinking ability.   Follow these instructions at home: Lifestyle  Make changes that your doctor recommends.  Eat a healthy diet.  Lose weight if needed.  Avoid alcohol, medicines to help you relax, and some pain medicines.  Do not use any products that contain nicotine or tobacco, such as cigarettes, e-cigarettes, and chewing tobacco. If you need help quitting, ask your doctor. General instructions  Take over-the-counter  and prescription medicines only as told by your doctor.  If you were given a machine to use while you sleep, use it only as told by your doctor.  If you are having surgery, make sure to tell your doctor you have sleep apnea. You may need to bring your device with you.  Keep all follow-up visits as told by your doctor. This is important. Contact a doctor if:  The machine that you were given to use during sleep bothers you or does not seem to be  working.  You do not get better.  You get worse. Get help right away if:  Your chest hurts.  You have trouble breathing in enough air.  You have an uncomfortable feeling in your back, arms, or stomach.  You have trouble talking.  One side of your body feels weak.  A part of your face is hanging down. These symptoms may be an emergency. Do not wait to see if the symptoms will go away. Get medical help right away. Call your local emergency services (911 in the [place] Do not drive yourself to the hospital. Summary  This condition affects breathing during sleep.  The most common cause is a collapsed or blocked airway.  The goal of treatment is to help you breathe normally while you sleep. This information is not intended to replace advice given to you by your health care provider. Make sure you discuss any questions you have with your health care provider. Document Revised: 02/07/2018 Document Reviewed: 12/17/2017 Elsevier Patient Education  [place].

## 2020-10-13 ENCOUNTER — Other Ambulatory Visit: Payer: Self-pay

## 2020-10-13 ENCOUNTER — Ambulatory Visit: Payer: PPO

## 2020-10-13 DIAGNOSIS — G4733 Obstructive sleep apnea (adult) (pediatric): Secondary | ICD-10-CM | POA: Diagnosis not present

## 2020-10-13 DIAGNOSIS — R0683 Snoring: Secondary | ICD-10-CM

## 2020-10-16 ENCOUNTER — Other Ambulatory Visit (INDEPENDENT_AMBULATORY_CARE_PROVIDER_SITE_OTHER): Payer: Self-pay | Admitting: Adult Health

## 2020-10-16 DIAGNOSIS — F3289 Other specified depressive episodes: Secondary | ICD-10-CM

## 2020-10-17 ENCOUNTER — Other Ambulatory Visit: Payer: Self-pay

## 2020-10-17 ENCOUNTER — Ambulatory Visit (INDEPENDENT_AMBULATORY_CARE_PROVIDER_SITE_OTHER): Payer: PPO | Admitting: Adult Health

## 2020-10-17 ENCOUNTER — Encounter (INDEPENDENT_AMBULATORY_CARE_PROVIDER_SITE_OTHER): Payer: Self-pay | Admitting: Adult Health

## 2020-10-17 VITALS — BP 110/66 | HR 61 | Temp 97.6°F | Ht 64.0 in | Wt 310.0 lb

## 2020-10-17 DIAGNOSIS — R7303 Prediabetes: Secondary | ICD-10-CM

## 2020-10-17 DIAGNOSIS — Z6841 Body Mass Index (BMI) 40.0 and over, adult: Secondary | ICD-10-CM

## 2020-10-17 DIAGNOSIS — E559 Vitamin D deficiency, unspecified: Secondary | ICD-10-CM

## 2020-10-17 DIAGNOSIS — F3289 Other specified depressive episodes: Secondary | ICD-10-CM

## 2020-10-17 DIAGNOSIS — E7849 Other hyperlipidemia: Secondary | ICD-10-CM | POA: Diagnosis not present

## 2020-10-17 MED ORDER — BUPROPION HCL ER (XL) 150 MG PO TB24: 150.0000 mg | ORAL_TABLET | Freq: Every day | ORAL | 0 refills | Status: DC

## 2020-10-17 MED ORDER — METFORMIN HCL 500 MG PO TABS: ORAL_TABLET | ORAL | 0 refills | Status: DC

## 2020-10-17 NOTE — Telephone Encounter (Signed)
Last seen Tracey 

## 2020-10-19 NOTE — Progress Notes (Signed)
Chief Complaint:   OBESITY James [name] is here to discuss his progress with his obesity treatment plan along with follow-up of his obesity related diagnoses. James [name] is on the Category 4 Plan and keeping a food journal and adhering to recommended goals of 550-700 calories and 45 g protein with supper and states he is following his eating plan approximately 90% of the time. James [name] states he is walking and stationary bike 30 minutes 5 times per week.  Today's visit was #: 14 Starting weight: 332 lbs Starting date: 02/08/2020 Today's weight: 310 lbs Today's date: 10/17/2020 Total lbs lost to date: 22 Total lbs lost since last in-office visit: 1  Interim History: James [name] will not be traveling anytime soon. He continues to follow category 4 very consistently at >90% of the time. He is tolerating Metformin better- resolution of nausea.  Subjective:   1. Vitamin D deficiency Discussed labs with patient today. Joshau's Vitamin D level was 63.2 on 09/22/2020. He is currently taking OTC vitamin D 2,000 IU each day. He denies nausea, vomiting or muscle weakness.  2. Pre-diabetes Discussed labs with patient today. 09/22/2020 A1c 5.8- stable, yet still elevated.  He is on Metformin 500 mg QD- except slight nausea without vomiting has resolved.  Lab Results  Component Value Date   HGBA1C 5.8 (H) 09/22/2020   Lab Results  Component Value Date   INSULIN 14.0 09/22/2020   INSULIN 10.7 06/23/2020   INSULIN 23.5 02/08/2020   INSULIN 13.4 04/10/2017   INSULIN 25.3 (H) 01/01/2017   3. Other depression, with emotional eating Discussed labs with patient today. Akif is on bupropion XL 150 mg QD. He denies history of seizures. He was previously on SSRI without good symptom control.  4. Other hyperlipidemia Discussed labs with patient today. 09/22/2020 lipid panel- improving LDL, slightly elevated. He is not currently on statin therapy. He denies tobacco/vape use. He was on atorvastatin but stopped due to  "dizziness".  Lab Results  Component Value Date   ALT 26 09/22/2020   AST 18 09/22/2020   ALKPHOS 76 09/22/2020   BILITOT 0.4 09/22/2020   Lab Results  Component Value Date   CHOL 175 09/22/2020   HDL 50 09/22/2020   LDLCALC 113 (H) 09/22/2020   TRIG 61 09/22/2020   CHOLHDL 3.5 09/22/2020    Assessment/Plan:   1. Vitamin D deficiency Low Vitamin D level contributes to fatigue and are associated with obesity, breast, and colon cancer. He agrees to continue to take OTC  Vitamin D @2 ,000 IU QD and will follow-up for routine testing of Vitamin D, at least 2-3 times per year to avoid over-replacement.  2. Pre-diabetes Ronell will continue to work on weight loss, exercise, and decreasing simple carbohydrates to help decrease the risk of diabetes.   - metFORMIN (GLUCOPHAGE) 500 MG tablet; One tablet daily with lunch  Dispense: 30 tablet; Refill: 0  3. Other depression, with emotional eating Behavior modification techniques were discussed today to help James [name] deal with his emotional/non-hunger eating behaviors.  Orders and follow up as documented in patient record.   - buPROPion (WELLBUTRIN XL) 150 MG 24 hr tablet; Take 1 tablet (150 mg total) by mouth daily.  Dispense: 30 tablet; Refill: 0  4. Other hyperlipidemia Cardiovascular risk and specific lipid/LDL goals reviewed.  We discussed several lifestyle modifications today and James [name] will continue to work on diet, exercise and weight loss efforts. Orders and follow up as documented in patient record.  -[facility] category 4 meal plan and regular  exercise.  Counseling Intensive lifestyle modifications are the first line treatment for this issue. Dietary changes: Increase soluble fiber. Decrease simple carbohydrates. Exercise changes: Moderate to vigorous-intensity aerobic activity 150 minutes per week if tolerated. Lipid-lowering medications: see documented in medical record.  5. Class 3 severe obesity with serious comorbidity and body mass  index (BMI) of 50.0 to 59.9 in adult, unspecified obesity type [facility])  Nichols is currently in the action stage of change. As such, his goal is to continue with weight loss efforts. He has agreed to the Category 4 Plan and keeping a food journal and adhering to recommended goals of 550-700 calories and 45 g protein with supper.   Exercise goals:  As is  Behavioral modification strategies: increasing lean protein intake, decreasing simple carbohydrates, meal planning and cooking strategies, keeping healthy foods in the home, and planning for success.  James [name] has agreed to follow-up with our clinic in 3 weeks. He was informed of the importance of frequent follow-up visits to maximize his success with intensive lifestyle modifications for his multiple health conditions.   Objective:   Blood pressure 110/66, pulse 61, temperature 97.6 F (36.4 C), height 5\' 4"  (1.626 m), weight (!) 310 lb (140.6 kg), SpO2 97 %. Body mass index is 53.21 kg/m.  General: Cooperative, alert, well developed, in no acute distress. HEENT: Conjunctivae and lids unremarkable. Cardiovascular: Regular rhythm.  Lungs: Normal work of breathing. Neurologic: No focal deficits.   Lab Results  Component Value Date   CREATININE 0.96 09/22/2020   BUN 17 09/22/2020   NA 140 09/22/2020   K 4.5 09/22/2020   CL 98 09/22/2020   CO2 28 09/22/2020   Lab Results  Component Value Date   ALT 26 09/22/2020   AST 18 09/22/2020   ALKPHOS 76 09/22/2020   BILITOT 0.4 09/22/2020   Lab Results  Component Value Date   HGBA1C 5.8 (H) 09/22/2020   HGBA1C 5.8 (H) 06/23/2020   HGBA1C 5.7 (H) 01/19/2020   HGBA1C 5.8 10/15/2019   HGBA1C 5.8 01/13/2019   Lab Results  Component Value Date   INSULIN 14.0 09/22/2020   INSULIN 10.7 06/23/2020   INSULIN 23.5 02/08/2020   INSULIN 13.4 04/10/2017   INSULIN 25.3 (H) 01/01/2017   Lab Results  Component Value Date   TSH 2.34 01/19/2020   Lab Results  Component Value Date   CHOL 175  09/22/2020   HDL 50 09/22/2020   LDLCALC 113 (H) 09/22/2020   TRIG 61 09/22/2020   CHOLHDL 3.5 09/22/2020   Lab Results  Component Value Date   WBC 7.1 01/19/2020   HGB 14.5 01/19/2020   HCT 44.1 01/19/2020   MCV 89.3 01/19/2020   PLT 208 01/19/2020   No results found for: IRON, TIBC, FERRITIN  Obesity Behavioral Intervention:   Approximately 15 minutes were spent on the discussion below.  ASK: We discussed the diagnosis of obesity with [name] today and [name] agreed to give [name] permission to discuss obesity behavioral modification therapy today.  ASSESS: Kolbi has the diagnosis of obesity and his BMI today is 53.3. Rumaldo is in the action stage of change.   ADVISE: Dreyden was educated on the multiple health risks of obesity as well as the benefit of weight loss to improve his health. He was advised of the need for long term treatment and the importance of lifestyle modifications to improve his current health and to decrease his risk of future health problems.  AGREE: Multiple dietary modification options and treatment options were discussed  and Jehu agreed to follow the recommendations documented in the above note.  ARRANGE: Shawnte was educated on the importance of frequent visits to treat obesity as outlined per CMS and USPSTF guidelines and agreed to schedule his next follow up appointment today.  Attestation Statements:   Reviewed by clinician on day of visit: allergies, medications, problem list, medical history, surgical history, family history, social history, and previous encounter notes.  Coral Ceo, CMA, am acting as transcriptionist for Mina Marble, NP.  I have reviewed the above documentation for accuracy and completeness, and I agree with the above. -  Witten Certain d. Sirenia Whitis, NP-C

## 2020-10-20 DIAGNOSIS — G4733 Obstructive sleep apnea (adult) (pediatric): Secondary | ICD-10-CM | POA: Diagnosis not present

## 2020-10-21 ENCOUNTER — Telehealth: Payer: Self-pay | Admitting: Pulmonary Disease

## 2020-10-21 DIAGNOSIS — R0683 Snoring: Secondary | ICD-10-CM

## 2020-10-21 DIAGNOSIS — G4733 Obstructive sleep apnea (adult) (pediatric): Secondary | ICD-10-CM

## 2020-10-21 NOTE — Telephone Encounter (Signed)
Call patient  Sleep study result  Date of study: 10/13/2020  Impression: Severe obstructive sleep apnea Moderate oxygen saturations  Recommendation: DME referral  Recommend CPAP therapy for severe obstructive sleep apnea  Auto titrating CPAP with pressure settings of 5-20 will be appropriate  Encourage weight loss measures  Follow-up in the office 4 to 6 weeks following initiation of treatment

## 2020-10-22 ENCOUNTER — Other Ambulatory Visit: Payer: Self-pay | Admitting: Adult Health

## 2020-10-22 DIAGNOSIS — I10 Essential (primary) hypertension: Secondary | ICD-10-CM

## 2020-10-24 ENCOUNTER — Ambulatory Visit (INDEPENDENT_AMBULATORY_CARE_PROVIDER_SITE_OTHER): Payer: PPO | Admitting: Psychologist

## 2020-10-24 DIAGNOSIS — F321 Major depressive disorder, single episode, moderate: Secondary | ICD-10-CM | POA: Diagnosis not present

## 2020-10-25 NOTE — Telephone Encounter (Signed)
Received a message via [facility] stating;   "What is the procedure for the results of sleep test. My chart showing severe obstructive sleep apnea. How do I proceed"  Pls regard; [phone]

## 2020-10-25 NOTE — Telephone Encounter (Signed)
Called and spoke to pt. Informed him of the results and recs per AO. Order placed for CPAP. Pt has an OV on 8/29, pt aware to call and reschedule if he has not had at least 31 days of data on machine. Nothing further needed at this time.

## 2020-11-08 ENCOUNTER — Other Ambulatory Visit: Payer: Self-pay

## 2020-11-08 ENCOUNTER — Encounter (INDEPENDENT_AMBULATORY_CARE_PROVIDER_SITE_OTHER): Payer: Self-pay | Admitting: Family Medicine

## 2020-11-08 ENCOUNTER — Ambulatory Visit (INDEPENDENT_AMBULATORY_CARE_PROVIDER_SITE_OTHER): Payer: PPO | Admitting: Family Medicine

## 2020-11-08 VITALS — BP 124/71 | HR 54 | Temp 97.7°F | Ht 64.0 in | Wt 313.0 lb

## 2020-11-08 DIAGNOSIS — R7303 Prediabetes: Secondary | ICD-10-CM

## 2020-11-08 DIAGNOSIS — Z6841 Body Mass Index (BMI) 40.0 and over, adult: Secondary | ICD-10-CM

## 2020-11-08 DIAGNOSIS — Z9189 Other specified personal risk factors, not elsewhere classified: Secondary | ICD-10-CM | POA: Diagnosis not present

## 2020-11-08 DIAGNOSIS — F3289 Other specified depressive episodes: Secondary | ICD-10-CM | POA: Diagnosis not present

## 2020-11-08 MED ORDER — BUPROPION HCL ER (XL) 150 MG PO TB24: 150.0000 mg | ORAL_TABLET | Freq: Every day | ORAL | 0 refills | Status: DC

## 2020-11-08 MED ORDER — METFORMIN HCL 500 MG PO TABS: ORAL_TABLET | ORAL | 0 refills | Status: DC

## 2020-11-10 NOTE — Progress Notes (Signed)
Chief Complaint:   OBESITY James [name] is here to discuss his progress with his obesity treatment plan along with follow-up of his obesity related diagnoses. James [name] is on the Category 4 Plan and keeping a food journal and adhering to recommended goals of 550-700 calories and 45 g protein with supper and states he is following his eating plan approximately 80% of the time. James [name] states he is walking 30 minutes 3 times per week.  Today's visit was #: 15 Starting weight: 332 lbs Starting date: 02/08/2020 Today's weight: 313 lbs Today's date: 11/08/2020 Total lbs lost to date: 19 Total lbs lost since last in-office visit: 0  Interim History: James [name] was diagnosed with severe OSA and is waiting on CPAP machine. Food wise, he has been hungry. He is trying to eat more protein bars (~150 cal) and jello pudding or [place] bars.  Subjective:   1. Pre-diabetes James [name]'s last A1c was 5.8 and insulin level 14.0. He is on Metformin with no GI side effects.  2. Other depression, with emotional eating James [name] is on Wellbutrin. He reports some improvement in symptoms.  3. At risk for deficient intake of food James [name] is at risk for deficient intake of food.   Assessment/Plan:   1. Pre-diabetes James [name] will continue to work on weight loss, exercise, and decreasing simple carbohydrates to help decrease the risk of diabetes.   Refill- metFORMIN (GLUCOPHAGE) 500 MG tablet; One tablet daily with lunch  Dispense: 30 tablet; Refill: 0  2. Other depression, with emotional eating Behavior modification techniques were discussed today to help James [name] deal with his emotional/non-hunger eating behaviors.  Orders and follow up as documented in patient record.   Refill- buPROPion (WELLBUTRIN XL) 150 MG 24 hr tablet; Take 1 tablet (150 mg total) by mouth daily.  Dispense: 30 tablet; Refill: 0  3. At risk for deficient intake of food James [name] was given approximately 15 minutes of deficit intake of food prevention counseling today.  James [name] is at risk for eating too few calories based on current food recall. He was encouraged to focus on meeting caloric and protein goals according to his recommended meal plan.    4. Class 3 severe obesity with serious comorbidity and body mass index (BMI) of 50.0 to 59.9 in adult, unspecified obesity type [facility])  James [name] is currently in the action stage of change. As such, his goal is to continue with weight loss efforts. He has agreed to the Category 4 Plan + 200 calories.   Exercise goals:  As is  Behavioral modification strategies: increasing lean protein intake, meal planning and cooking strategies, keeping healthy foods in the home, and planning for success.  James [name] has agreed to follow-up with our clinic in 3 weeks. He was informed of the importance of frequent follow-up visits to maximize his success with intensive lifestyle modifications for his multiple health conditions.   Objective:   Blood pressure 124/71, pulse (!) 54, temperature 97.7 F (36.5 C), height 5\' 4"  (1.626 m), weight (!) 313 lb (142 kg), SpO2 97 %. Body mass index is 53.73 kg/m.  General: Cooperative, alert, well developed, in no acute distress. HEENT: Conjunctivae and lids unremarkable. Cardiovascular: Regular rhythm.  Lungs: Normal work of breathing. Neurologic: No focal deficits.   Lab Results  Component Value Date   CREATININE 0.96 09/22/2020   BUN 17 09/22/2020   NA 140 09/22/2020   K 4.5 09/22/2020   CL 98 09/22/2020   CO2 28 09/22/2020   Lab Results  Component Value Date  ALT 26 09/22/2020   AST 18 09/22/2020   ALKPHOS 76 09/22/2020   BILITOT 0.4 09/22/2020   Lab Results  Component Value Date   HGBA1C 5.8 (H) 09/22/2020   HGBA1C 5.8 (H) 06/23/2020   HGBA1C 5.7 (H) 01/19/2020   HGBA1C 5.8 10/15/2019   HGBA1C 5.8 01/13/2019   Lab Results  Component Value Date   INSULIN 14.0 09/22/2020   INSULIN 10.7 06/23/2020   INSULIN 23.5 02/08/2020   INSULIN 13.4 04/10/2017   INSULIN 25.3 (H)  01/01/2017   Lab Results  Component Value Date   TSH 2.34 01/19/2020   Lab Results  Component Value Date   CHOL 175 09/22/2020   HDL 50 09/22/2020   LDLCALC 113 (H) 09/22/2020   TRIG 61 09/22/2020   CHOLHDL 3.5 09/22/2020   Lab Results  Component Value Date   VD25OH 63.2 09/22/2020   VD25OH 46.5 06/23/2020   VD25OH 28.1 (L) 02/08/2020   Lab Results  Component Value Date   WBC 7.1 01/19/2020   HGB 14.5 01/19/2020   HCT 44.1 01/19/2020   MCV 89.3 01/19/2020   PLT 208 01/19/2020    Attestation Statements:   Reviewed by clinician on day of visit: allergies, medications, problem list, medical history, surgical history, family history, social history, and previous encounter notes.  Coral Ceo, CMA, am acting as transcriptionist for Coralie Common, MD.  I have reviewed the above documentation for accuracy and completeness, and I agree with the above. - Jinny Blossom, MD

## 2020-11-22 ENCOUNTER — Telehealth: Payer: Self-pay | Admitting: Pulmonary Disease

## 2020-11-22 NOTE — Telephone Encounter (Signed)
Spoke to [place] with [employer] about this patient.  That timeline doesn't seem to be correct for [employer].  I will call the patient with an update once I have gotten one.

## 2020-11-22 NOTE — Telephone Encounter (Signed)
Called and spoke with Patient.  Patient stated he was told by [employer] that Cpap's are on back order, and he will be waiting months to receive his cpap. Patient requested alternatives to Cpap's or trying another DME company. Will Patient insurance allow him to receive a cpap through [place]? We were told [facility] had cpap machine's available for Patients and will deliver them up to 150 miles.  Message routed to [facility]

## 2020-11-29 ENCOUNTER — Other Ambulatory Visit: Payer: Self-pay

## 2020-11-29 ENCOUNTER — Ambulatory Visit (INDEPENDENT_AMBULATORY_CARE_PROVIDER_SITE_OTHER): Payer: PPO | Admitting: Family Medicine

## 2020-11-29 ENCOUNTER — Encounter (INDEPENDENT_AMBULATORY_CARE_PROVIDER_SITE_OTHER): Payer: Self-pay | Admitting: Family Medicine

## 2020-11-29 VITALS — BP 124/74 | HR 59 | Temp 98.1°F | Ht 64.0 in | Wt 313.0 lb

## 2020-11-29 DIAGNOSIS — F3289 Other specified depressive episodes: Secondary | ICD-10-CM

## 2020-11-29 DIAGNOSIS — Z6841 Body Mass Index (BMI) 40.0 and over, adult: Secondary | ICD-10-CM | POA: Diagnosis not present

## 2020-11-29 DIAGNOSIS — R7303 Prediabetes: Secondary | ICD-10-CM

## 2020-11-29 DIAGNOSIS — G4733 Obstructive sleep apnea (adult) (pediatric): Secondary | ICD-10-CM | POA: Diagnosis not present

## 2020-11-29 MED ORDER — METFORMIN HCL 500 MG PO TABS: ORAL_TABLET | ORAL | 0 refills | Status: DC

## 2020-11-29 NOTE — Telephone Encounter (Signed)
Pt is being set up & [employer] today.

## 2020-12-01 NOTE — Progress Notes (Signed)
Chief Complaint:   OBESITY Kristin is here to discuss his progress with his obesity treatment plan along with follow-up of his obesity related diagnoses. Jontez is on the Category 4 Plan + 200 calories and states he is following his eating plan approximately 75% of the time. Pietro states he is walking and doing house work 60 minutes 1 times per week.  Today's visit was #: 16 Starting weight: 332 lbs Starting date: 02/08/2020 Today's weight: 313 lbs Today's date: 11/29/2020 Total lbs lost to date: 19 Total lbs lost since last in-office visit: 0  Interim History: Maddison had a stressful few weeks with wife and grandson issues. He mentions he has gone back to difficulty doing much and gaining momentum on staying adherent to meal plan. Pt wants to get out and start exercising with [employer].  Subjective:   1. Pre-diabetes Mat's last A1c was 5.8 and insulin level 14.0. He is on Metformin with no GI side effects.  2. Other depression Boluwatife is doing well on Wellbutrin. Pt denies suicidal or homicidal ideations. He reports symptoms of mostly anhedonia.  Assessment/Plan:   1. Pre-diabetes Asia will continue to work on weight loss, exercise, and decreasing simple carbohydrates to help decrease the risk of diabetes.   Refill- metFORMIN (GLUCOPHAGE) 500 MG tablet; One tablet daily with lunch  Dispense: 30 tablet; Refill: 0  2. Other depression Behavior modification techniques were discussed today to help Daden deal with his emotional/non-hunger eating behaviors.  Orders and follow up as documented in patient record. Continue Wellbutrin 150 mg by mouth every morning. No refill needed at this time.  3. Obesity with current BMI of 53.7  Lavern is currently in the action stage of change. As such, his goal is to continue with weight loss efforts. He has agreed to the Category 4 Plan + 200 calories.   Exercise goals: All adults should avoid inactivity. Some physical activity is better  than none, and adults who participate in any amount of physical activity gain some health benefits.  Behavioral modification strategies: increasing lean protein intake, no skipping meals, meal planning and cooking strategies, and keeping healthy foods in the home.  Hiroyuki has agreed to follow-up with our clinic in 3 weeks. He was informed of the importance of frequent follow-up visits to maximize his success with intensive lifestyle modifications for his multiple health conditions.   Objective:   Blood pressure 124/74, pulse (!) 59, temperature 98.1 F (36.7 C), height '5\' 4"'$  (1.626 m), weight (!) 313 lb (142 kg), SpO2 97 %. Body mass index is 53.73 kg/m.  General: Cooperative, alert, well developed, in no acute distress. HEENT: Conjunctivae and lids unremarkable. Cardiovascular: Regular rhythm.  Lungs: Normal work of breathing. Neurologic: No focal deficits.   Lab Results  Component Value Date   CREATININE 0.96 09/22/2020   BUN 17 09/22/2020   NA 140 09/22/2020   K 4.5 09/22/2020   CL 98 09/22/2020   CO2 28 09/22/2020   Lab Results  Component Value Date   ALT 26 09/22/2020   AST 18 09/22/2020   ALKPHOS 76 09/22/2020   BILITOT 0.4 09/22/2020   Lab Results  Component Value Date   HGBA1C 5.8 (H) 09/22/2020   HGBA1C 5.8 (H) 06/23/2020   HGBA1C 5.7 (H) 01/19/2020   HGBA1C 5.8 10/15/2019   HGBA1C 5.8 01/13/2019   Lab Results  Component Value Date   INSULIN 14.0 09/22/2020   INSULIN 10.7 06/23/2020   INSULIN 23.5 02/08/2020   INSULIN 13.4  04/10/2017   INSULIN 25.3 (H) 01/01/2017   Lab Results  Component Value Date   TSH 2.34 01/19/2020   Lab Results  Component Value Date   CHOL 175 09/22/2020   HDL 50 09/22/2020   LDLCALC 113 (H) 09/22/2020   TRIG 61 09/22/2020   CHOLHDL 3.5 09/22/2020   Lab Results  Component Value Date   VD25OH 63.2 09/22/2020   VD25OH 46.5 06/23/2020   VD25OH 28.1 (L) 02/08/2020   Lab Results  Component Value Date   WBC 7.1 01/19/2020    HGB 14.5 01/19/2020   HCT 44.1 01/19/2020   MCV 89.3 01/19/2020   PLT 208 01/19/2020   No results found for: IRON, TIBC, FERRITIN  Obesity Behavioral Intervention:   Approximately 15 minutes were spent on the discussion below.  ASK: We discussed the diagnosis of obesity with [name] today and [name] agreed to give [name] permission to discuss obesity behavioral modification therapy today.  ASSESS: Matthews has the diagnosis of obesity and his BMI today is 53.7. Imogene is in the action stage of change.   ADVISE: Kaylem was educated on the multiple health risks of obesity as well as the benefit of weight loss to improve his health. He was advised of the need for long term treatment and the importance of lifestyle modifications to improve his current health and to decrease his risk of future health problems.  AGREE: Multiple dietary modification options and treatment options were discussed and Davinder agreed to follow the recommendations documented in the above note.  ARRANGE: Eleazar was educated on the importance of frequent visits to treat obesity as outlined per CMS and USPSTF guidelines and agreed to schedule his next follow up appointment today.  Attestation Statements:   Reviewed by clinician on day of visit: allergies, medications, problem list, medical history, surgical history, family history, social history, and previous encounter notes.  Coral Ceo, CMA, am acting as transcriptionist for Coralie Common, MD.   I have reviewed the above documentation for accuracy and completeness, and I agree with the above. - Coralie Common, MD

## 2020-12-05 ENCOUNTER — Ambulatory Visit (INDEPENDENT_AMBULATORY_CARE_PROVIDER_SITE_OTHER): Payer: PPO | Admitting: Psychologist

## 2020-12-05 DIAGNOSIS — F321 Major depressive disorder, single episode, moderate: Secondary | ICD-10-CM

## 2020-12-08 ENCOUNTER — Encounter (INDEPENDENT_AMBULATORY_CARE_PROVIDER_SITE_OTHER): Payer: Self-pay

## 2020-12-08 ENCOUNTER — Other Ambulatory Visit: Payer: Self-pay | Admitting: Adult Health

## 2020-12-08 ENCOUNTER — Other Ambulatory Visit (INDEPENDENT_AMBULATORY_CARE_PROVIDER_SITE_OTHER): Payer: Self-pay | Admitting: Family Medicine

## 2020-12-08 DIAGNOSIS — F3289 Other specified depressive episodes: Secondary | ICD-10-CM

## 2020-12-08 NOTE — Telephone Encounter (Signed)
Message sent to pt-[facility] 

## 2020-12-20 ENCOUNTER — Other Ambulatory Visit: Payer: Self-pay

## 2020-12-20 ENCOUNTER — Ambulatory Visit (INDEPENDENT_AMBULATORY_CARE_PROVIDER_SITE_OTHER): Payer: PPO | Admitting: Family Medicine

## 2020-12-20 VITALS — BP 125/63 | HR 51 | Temp 97.7°F | Ht 64.0 in | Wt 316.0 lb

## 2020-12-20 DIAGNOSIS — R7303 Prediabetes: Secondary | ICD-10-CM

## 2020-12-20 DIAGNOSIS — F3289 Other specified depressive episodes: Secondary | ICD-10-CM

## 2020-12-20 DIAGNOSIS — Z9189 Other specified personal risk factors, not elsewhere classified: Secondary | ICD-10-CM

## 2020-12-20 DIAGNOSIS — Z6841 Body Mass Index (BMI) 40.0 and over, adult: Secondary | ICD-10-CM

## 2020-12-20 MED ORDER — BUPROPION HCL ER (XL) 150 MG PO TB24: 150.0000 mg | ORAL_TABLET | Freq: Every day | ORAL | 0 refills | Status: DC

## 2020-12-21 NOTE — Progress Notes (Signed)
Chief Complaint:   OBESITY James [name] is here to discuss his progress with his obesity treatment plan along with follow-up of his obesity related diagnoses. James [name] is on the Category 4 Plan + 200 calories and states he is following his eating plan approximately 90% of the time. James [name] states he is walking and going to the gym 30-40 minutes 3-5 times per week.  Today's visit was #: 48 Starting weight: 332 lbs Starting date: 02/08/2020 Today's weight: 316 lbs Today's date: 12/20/2020 Total lbs lost to date: 16 Total lbs lost since last in-office visit: 0  Interim History: James [name] got a CPAP machine and started working with a trainer at [employer] to do some cardio and weight machines. He ate a little extra carbs over the last few weeks. He wants to stay on plan and focus on protein and being a bit more in control of carb intake.  Subjective:   1. Other depression, with emotional eating James [name] is on Wellbutrin with good results. He denies side effects of medication. BP controlled.  2. Pre-diabetes His last A1c was 5.8 and insulin level 14.0. He is on Metformin and denies GI side effects.  3. At risk of diabetes mellitus James [name] is at higher than average risk for developing diabetes due to obesity.   Assessment/Plan:   1. Other depression, with emotional eating Behavior modification techniques were discussed today to help Praise deal with his emotional/non-hunger eating behaviors.  Orders and follow up as documented in patient record.   Refill- buPROPion (WELLBUTRIN XL) 150 MG 24 hr tablet; Take 1 tablet (150 mg total) by mouth daily.  Dispense: 30 tablet; Refill: 0  2. Pre-diabetes James [name] will continue to work on weight loss, exercise, and decreasing simple carbohydrates to help decrease the risk of diabetes. Continue current treatment plan.  3. At risk of diabetes mellitus James [name] was given approximately 15 minutes of diabetes education and counseling today. We discussed intensive lifestyle  modifications today with an emphasis on weight loss as well as increasing exercise and decreasing simple carbohydrates in his diet. We also reviewed medication options with an emphasis on risk versus benefit of those discussed.   Repetitive spaced learning was employed today to elicit superior memory formation and behavioral change.  4. Obesity with current BMI of 54.2  James [name] is currently in the action stage of change. As such, his goal is to continue with weight loss efforts. He has agreed to the Category 4 Plan + 200 calories.   Exercise goals:  As is  Behavioral modification strategies: increasing lean protein intake, meal planning and cooking strategies, keeping healthy foods in the home, and planning for success.  James [name] has agreed to follow-up with our clinic in 3 weeks. He was informed of the importance of frequent follow-up visits to maximize his success with intensive lifestyle modifications for his multiple health conditions.   Objective:   Blood pressure 125/63, pulse (!) 51, temperature 97.7 F (36.5 C), height '5\' 4"'$  (1.626 m), weight (!) 316 lb (143.3 kg), SpO2 96 %. Body mass index is 54.24 kg/m.  General: Cooperative, alert, well developed, in no acute distress. HEENT: Conjunctivae and lids unremarkable. Cardiovascular: Regular rhythm.  Lungs: Normal work of breathing. Neurologic: No focal deficits.   Lab Results  Component Value Date   CREATININE 0.96 09/22/2020   BUN 17 09/22/2020   NA 140 09/22/2020   K 4.5 09/22/2020   CL 98 09/22/2020   CO2 28 09/22/2020   Lab Results  Component Value  Date   ALT 26 09/22/2020   AST 18 09/22/2020   ALKPHOS 76 09/22/2020   BILITOT 0.4 09/22/2020   Lab Results  Component Value Date   HGBA1C 5.8 (H) 09/22/2020   HGBA1C 5.8 (H) 06/23/2020   HGBA1C 5.7 (H) 01/19/2020   HGBA1C 5.8 10/15/2019   HGBA1C 5.8 01/13/2019   Lab Results  Component Value Date   INSULIN 14.0 09/22/2020   INSULIN 10.7 06/23/2020   INSULIN 23.5  02/08/2020   INSULIN 13.4 04/10/2017   INSULIN 25.3 (H) 01/01/2017   Lab Results  Component Value Date   TSH 2.34 01/19/2020   Lab Results  Component Value Date   CHOL 175 09/22/2020   HDL 50 09/22/2020   LDLCALC 113 (H) 09/22/2020   TRIG 61 09/22/2020   CHOLHDL 3.5 09/22/2020   Lab Results  Component Value Date   VD25OH 63.2 09/22/2020   VD25OH 46.5 06/23/2020   VD25OH 28.1 (L) 02/08/2020   Lab Results  Component Value Date   WBC 7.1 01/19/2020   HGB 14.5 01/19/2020   HCT 44.1 01/19/2020   MCV 89.3 01/19/2020   PLT 208 01/19/2020    Attestation Statements:   Reviewed by clinician on day of visit: allergies, medications, problem list, medical history, surgical history, family history, social history, and previous encounter notes.  Coral Ceo, CMA, am acting as transcriptionist for Coralie Common, MD.   I have reviewed the above documentation for accuracy and completeness, and I agree with the above. - Coralie Common, MD

## 2020-12-30 DIAGNOSIS — G4733 Obstructive sleep apnea (adult) (pediatric): Secondary | ICD-10-CM | POA: Diagnosis not present

## 2021-01-02 ENCOUNTER — Ambulatory Visit: Payer: PPO | Admitting: Pulmonary Disease

## 2021-01-12 ENCOUNTER — Encounter (INDEPENDENT_AMBULATORY_CARE_PROVIDER_SITE_OTHER): Payer: Self-pay | Admitting: Family Medicine

## 2021-01-12 ENCOUNTER — Other Ambulatory Visit: Payer: Self-pay

## 2021-01-12 ENCOUNTER — Ambulatory Visit (INDEPENDENT_AMBULATORY_CARE_PROVIDER_SITE_OTHER): Payer: PPO | Admitting: Family Medicine

## 2021-01-12 VITALS — BP 132/81 | HR 67 | Temp 98.1°F | Ht 64.0 in | Wt 315.0 lb

## 2021-01-12 DIAGNOSIS — R7303 Prediabetes: Secondary | ICD-10-CM

## 2021-01-12 DIAGNOSIS — Z6841 Body Mass Index (BMI) 40.0 and over, adult: Secondary | ICD-10-CM | POA: Diagnosis not present

## 2021-01-12 DIAGNOSIS — I10 Essential (primary) hypertension: Secondary | ICD-10-CM

## 2021-01-12 NOTE — Progress Notes (Signed)
Chief Complaint:   OBESITY James [name] is here to discuss his progress with his obesity treatment plan along with follow-up of his obesity related diagnoses. James [name] is on the Category 4 Plan + 200 calories and states he is following his eating plan approximately 90% of the time. [name] states he is walking and going to the gym 30-60 minutes 3-4 times per week.  Today's visit was #: 18 Starting weight: 332 lbs Starting date: 02/08/2020 Today's weight: 315 lbs Today's date: 01/12/2021 Total lbs lost to date: 17 Total lbs lost since last in-office visit: 1  Interim History: James [name] has been getting quite a bit of dental work in the last few weeks. He started osteoarthritis of knee study and was placed on Cymbalta. He is trying to eat more soft foods with recent dental issues. Pt didn't do much over the Labor Day weekend. Pt's daughter is coming next week for his wife's 70th birthday.  Subjective:   1. Pre-diabetes [name]'s last A1c was 5.8 and insulin level 14.0. He is on Metformin with no GI side effects.  2. Essential hypertension BP well controlled. Pt denies chest pain/chest pressure/headache. He is on lisinopril 20 mg.  Assessment/Plan:   1. Pre-diabetes James [name] will continue to work on weight loss, exercise, and decreasing simple carbohydrates to help decrease the risk of diabetes.   Refill Metformin 500 mg PO daily, Disp #30, 0 RF  2. Essential hypertension James [name] is working on healthy weight loss and exercise to improve blood pressure control. We will watch for signs of hypotension as he continues his lifestyle modifications. Follow up BP at next appt. No change in dosage of meds at this time.  3. Obesity with current BMI of 54.2  James [name] is currently in the action stage of change. As such, his goal is to continue with weight loss efforts. He has agreed to the Category 4 Plan + 200 calories.   Exercise goals: All adults should avoid inactivity. Some physical activity is better than  none, and adults who participate in any amount of physical activity gain some health benefits.  Behavioral modification strategies: increasing lean protein intake, meal planning and cooking strategies, keeping healthy foods in the home, and planning for success.  James [name] has agreed to follow-up with our clinic in 3 weeks. He was informed of the importance of frequent follow-up visits to maximize his success with intensive lifestyle modifications for his multiple health conditions.   Objective:   Blood pressure 132/81, pulse 67, temperature 98.1 F (36.7 C), height '5\' 4"'$  (1.626 m), weight (!) 315 lb (142.9 kg), SpO2 96 %. Body mass index is 54.07 kg/m.  General: Cooperative, alert, well developed, in no acute distress. HEENT: Conjunctivae and lids unremarkable. Cardiovascular: Regular rhythm.  Lungs: Normal work of breathing. Neurologic: No focal deficits.   Lab Results  Component Value Date   CREATININE 0.96 09/22/2020   BUN 17 09/22/2020   NA 140 09/22/2020   K 4.5 09/22/2020   CL 98 09/22/2020   CO2 28 09/22/2020   Lab Results  Component Value Date   ALT 26 09/22/2020   AST 18 09/22/2020   ALKPHOS 76 09/22/2020   BILITOT 0.4 09/22/2020   Lab Results  Component Value Date   HGBA1C 5.8 (H) 09/22/2020   HGBA1C 5.8 (H) 06/23/2020   HGBA1C 5.7 (H) 01/19/2020   HGBA1C 5.8 10/15/2019   HGBA1C 5.8 01/13/2019   Lab Results  Component Value Date   INSULIN 14.0 09/22/2020   INSULIN 10.7 06/23/2020  INSULIN 23.5 02/08/2020   INSULIN 13.4 04/10/2017   INSULIN 25.3 (H) 01/01/2017   Lab Results  Component Value Date   TSH 2.34 01/19/2020   Lab Results  Component Value Date   CHOL 175 09/22/2020   HDL 50 09/22/2020   LDLCALC 113 (H) 09/22/2020   TRIG 61 09/22/2020   CHOLHDL 3.5 09/22/2020   Lab Results  Component Value Date   VD25OH 63.2 09/22/2020   VD25OH 46.5 06/23/2020   VD25OH 28.1 (L) 02/08/2020   Lab Results  Component Value Date   WBC 7.1 01/19/2020    HGB 14.5 01/19/2020   HCT 44.1 01/19/2020   MCV 89.3 01/19/2020   PLT 208 01/19/2020   No results found for: IRON, TIBC, FERRITIN  Obesity Behavioral Intervention:   Approximately 15 minutes were spent on the discussion below.  ASK: We discussed the diagnosis of obesity with [name] today and [name] agreed to give [name] permission to discuss obesity behavioral modification therapy today.  ASSESS: Ankush has the diagnosis of obesity and his BMI today is 54.2. Damen is in the action stage of change.   ADVISE: James [name] was educated on the multiple health risks of obesity as well as the benefit of weight loss to improve his health. He was advised of the need for long term treatment and the importance of lifestyle modifications to improve his current health and to decrease his risk of future health problems.  AGREE: Multiple dietary modification options and treatment options were discussed and Fatih agreed to follow the recommendations documented in the above note.  ARRANGE: [name] was educated on the importance of frequent visits to treat obesity as outlined per CMS and USPSTF guidelines and agreed to schedule his next follow up appointment today.  Attestation Statements:   Reviewed by clinician on day of visit: allergies, medications, problem list, medical history, surgical history, family history, social history, and previous encounter notes.  Coral Ceo, CMA, am acting as transcriptionist for Coralie Common, MD.  I have reviewed the above documentation for accuracy and completeness, and I agree with the above. - Coralie Common, MD

## 2021-01-15 ENCOUNTER — Other Ambulatory Visit (INDEPENDENT_AMBULATORY_CARE_PROVIDER_SITE_OTHER): Payer: Self-pay | Admitting: Family Medicine

## 2021-01-15 DIAGNOSIS — R7303 Prediabetes: Secondary | ICD-10-CM

## 2021-01-16 ENCOUNTER — Encounter (INDEPENDENT_AMBULATORY_CARE_PROVIDER_SITE_OTHER): Payer: Self-pay

## 2021-01-16 NOTE — Telephone Encounter (Signed)
Message sent to James [name]

## 2021-01-17 ENCOUNTER — Other Ambulatory Visit: Payer: Self-pay

## 2021-01-17 ENCOUNTER — Ambulatory Visit: Payer: PPO | Admitting: Pulmonary Disease

## 2021-01-17 ENCOUNTER — Encounter: Payer: Self-pay | Admitting: Pulmonary Disease

## 2021-01-17 VITALS — BP 120/70 | HR 63 | Ht 64.0 in | Wt 319.6 lb

## 2021-01-17 DIAGNOSIS — G4733 Obstructive sleep apnea (adult) (pediatric): Secondary | ICD-10-CM

## 2021-01-17 DIAGNOSIS — Z9989 Dependence on other enabling machines and devices: Secondary | ICD-10-CM

## 2021-01-17 NOTE — Progress Notes (Signed)
[facility]IGNA[facility]    [id number]    22-Dec-1955  Primary Care Physician:Nafziger, Tommi Rumps, NP  Referring Physician: Dorothyann Peng, NP [address] [place],  [place] [address]  Chief complaint:   Patient with severe obstructive sleep apnea In for follow-up  HPI:  Has been using CPAP Benefiting from CPAP use  Energy level significantly better Waking up feeling like his had a good nights rest  Has no significant concerns with his CPAP at present  Time sleepiness is better Does not wake up as much at night  Usually goes to bed between 1030 and 11 Falls asleep in 10 to 30 minutes 2-5 awakenings Final wake up time between 730 and 830  Continues to work on weight loss efforts  Both parents snored  Reformed smoker-quit over 25 years ago  Memory is good, able to drive safely, no morning headaches  Outpatient Encounter Medications as of 01/17/2021  Medication Sig   acetaminophen (TYLENOL) 500 MG tablet Take 1,000 mg by mouth at bedtime as needed for moderate pain.   amoxicillin (AMOXIL) 500 MG capsule Take 2,000 mg by mouth See admin instructions. Take 2000 mg 1 hour prior to dental work   cetirizine (ZYRTEC) 10 MG tablet Take 10 mg by mouth daily.   cholecalciferol (VITAMIN D3) 25 MCG (1000 UNIT) tablet Take 2,000 Units by mouth daily.   DULoxetine (CYMBALTA) 30 MG capsule Take 1 capsule by mouth daily.   fluticasone (FLONASE) 50 MCG/ACT nasal spray Place 1 spray into both nostrils daily.    hydrochlorothiazide (HYDRODIURIL) 25 MG tablet TAKE 1 TABLET (25 MG TOTAL) BY MOUTH DAILY.   ibuprofen (ADVIL) 200 MG tablet Take 400 mg by mouth at bedtime as needed for moderate pain.    Ketotifen Fumarate (ALLERGY EYE DROPS OP) Place 1 drop into both eyes daily as needed (allergies).   lidocaine (LMX) 4 % cream Apply 1 application topically daily as needed (pain).   lisinopril (ZESTRIL) 10 MG tablet TAKE 2 TABLETS BY MOUTH EVERY DAY   metFORMIN (GLUCOPHAGE) 500 MG tablet  One tablet daily with lunch   Multiple Vitamin (MULTIVITAMIN) tablet Take 1 tablet by mouth daily.   [DISCONTINUED] DULoxetine (CYMBALTA) 30 MG capsule Take 60 mg by mouth daily.   [DISCONTINUED] buPROPion (WELLBUTRIN XL) 150 MG 24 hr tablet Take 1 tablet (150 mg total) by mouth daily. (Patient not taking: Reported on 01/12/2021)   No facility-administered encounter medications on file as of 01/17/2021.    Allergies as of 01/17/2021   (No Known Allergies)    Past Medical History:  Diagnosis Date   Arthritis    Bilateral swelling of feet    Chicken pox    Complication of anesthesia    difficulty waking up   Depression    Hypertension    Joint pain    Obesity    Osteoarthritis    Prediabetes    Shortness of breath    Swelling    feet and legs   Vitamin D deficiency     Past Surgical History:  Procedure Laterality Date   cartilage removal     COLONOSCOPY WITH PROPOFOL N/A 12/14/2019   Procedure: COLONOSCOPY WITH PROPOFOL;  Surgeon: Doran Stabler, MD;  Location: [facility];  Service: Gastroenterology;  Laterality: N/A;   KNEE SURGERY  2000   POLYPECTOMY  12/14/2019   Procedure: POLYPECTOMY;  Surgeon: Doran Stabler, MD;  Location: [facility];  Service: Gastroenterology;;   TOTAL HIP  ARTHROPLASTY Left 02/11/2019   Procedure: TOTAL HIP ARTHROPLASTY-POSTERIOR;  Surgeon: Gaynelle Arabian, MD;  Location: [facility];  Service: Orthopedics;  Laterality: Left;  120mn    Family History  Problem Relation Age of Onset   Cancer Mother        lung and soft tissue cancer from radiation   Stroke Mother    Obesity Mother    Heart disease Father        CHF   Diabetes Father    Hypertension Father    Obesity Father    Heart disease Maternal Grandfather    Diabetes Paternal Grandmother    Ovarian cancer Sister    Colon cancer Neg Hx    Colon polyps Neg Hx    Esophageal cancer Neg Hx    Rectal cancer Neg Hx    Stomach cancer Neg Hx     Social History   Socioeconomic History    Marital status: Married    Spouse name: Not on file   Number of children: Not on file   Years of education: Not on file   Highest education level: Not on file  Occupational History   Occupation: At home caregiver  Tobacco Use   Smoking status: Former    Types: Cigarettes    Quit date: 07/16/1992    Years since quitting: 28.5   Smokeless tobacco: Never  Substance and Sexual Activity   Alcohol use: Yes    Comment: very little per patient    Drug use: No   Sexual activity: Not on file  Other Topics Concern   Not on file  Social History Narrative   Work or School: self employed, 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      Home Situation: living with and caring for wife whom had stroke and brain tumor      Spiritual Beliefs: Methodist      Lifestyle: no regular exercise, diet poor            Social Determinants of Health   Financial Resource Strain: Low Risk    Difficulty of Paying Living Expenses: Not hard at all  Food Insecurity: No Food Insecurity   Worried About RCharity fundraiserin the Last Year: Never true   RArboriculturistin the Last Year: Never true  Transportation Needs: No Transportation Needs   Lack of Transportation (Medical): No   Lack of Transportation (Non-Medical): No  Physical Activity: Insufficiently Active   Days of Exercise per Week: 3 days   Minutes of Exercise per Session: 30 min  Stress: No Stress Concern Present   Feeling of Stress : Not at all  Social Connections: Socially Isolated   Frequency of Communication with Friends and Family: Once a week   Frequency of Social Gatherings with Friends and Family: Never   Attends Religious Services: Never   AMarine scientistor Organizations: No   Attends CMusic therapist Never   Marital Status: Married  IHuman resources officerViolence: Not At Risk   Fear of Current or Ex-Partner: No   Emotionally Abused: No   Physically Abused: No   Sexually Abused: No    Review of Systems   Respiratory:  Positive for apnea.   Psychiatric/Behavioral:  Positive for sleep disturbance.    Vitals:   01/17/21 1028  BP: 120/70  Pulse: 63  SpO2: 97%     Physical Exam Constitutional:      Appearance: He is obese.  HENT:     Head: Normocephalic.  Nose: Nose normal. No congestion.     Mouth/Throat:     Mouth: Mucous membranes are moist.     Comments: Mallampati 4, crowded oropharynx, macroglossia Eyes:     General:        Right eye: No discharge.        Left eye: No discharge.     Pupils: Pupils are equal, round, and reactive to light.  Cardiovascular:     Rate and Rhythm: Normal rate and regular rhythm.     Heart sounds: No murmur heard.   No friction rub.  Pulmonary:     Effort: No respiratory distress.     Breath sounds: No stridor. No wheezing or rhonchi.  Musculoskeletal:     Cervical back: No rigidity or tenderness.  Neurological:     Mental Status: He is alert.  Psychiatric:        Mood and Affect: Mood normal.   Compliance data reveals 93% compliance 95 percentile pressure of 16.8 Machine set between 5 and 20 AHI of 4.0   Assessment:  Severe obstructive sleep apnea on CPAP therapy -Tolerating CPAP therapy well -Sleep quality much better -Energy levels much better  He has noted significant improvement in how he feels generally with CPAP use  Pathophysiology of sleep disordered breathing reviewed with the patient Treatment options for obstructive sleep apnea discussed with the patient  Plan/Recommendations: Continue CPAP on a nightly basis  I will see him back in about 6 months  Weight loss efforts encouraged  Encouraged to call with any significant concerns   Sherrilyn Rist MD [facility] 01/17/2021, 10:33 AM  CC: Dorothyann Peng, NP

## 2021-01-17 NOTE — Telephone Encounter (Signed)
LAST APPOINTMENT DATE: 01/12/21 NEXT APPOINTMENT DATE: 02/02/21   [id number] [id number]: [phone]: [phone] Patient is requesting a refill of the following medications: No prescriptions requested or ordered in this encounter   Date last filled: 11/29/20 Previously prescribed by Dr UJearld Shines Lab Results      Component                Value               Date                      HGBA1C                   5.8 (H)             09/22/2020                HGBA1C                   5.8 (H)             06/23/2020                HGBA1C                   5.7 (H)             01/19/2020           Lab Results      Component                Value               Date                      LDLCALC                  113 (H)             09/22/2020                CREATININE               0.96                09/22/2020           Lab Results      Component                Value               Date                      VD25OH                   63.2                09/22/2020                VD25OH                   46.5                06/23/2020                VD25OH                   28.1 (L)  02/08/2020            BP Readings from Last 3 Encounters: 01/12/21 : 132/81 12/20/20 : 125/63 11/29/20 : 124/74

## 2021-01-17 NOTE — Patient Instructions (Signed)
Sleep apnea appears well treated  Continue using CPAP on a nightly basis  I will follow-up with you in 6 months  Call with any significant concerns

## 2021-01-27 MED ORDER — METFORMIN HCL 500 MG PO TABS: ORAL_TABLET | ORAL | 0 refills | Status: DC

## 2021-01-30 DIAGNOSIS — G4733 Obstructive sleep apnea (adult) (pediatric): Secondary | ICD-10-CM | POA: Diagnosis not present

## 2021-02-02 ENCOUNTER — Encounter (INDEPENDENT_AMBULATORY_CARE_PROVIDER_SITE_OTHER): Payer: Self-pay | Admitting: Family Medicine

## 2021-02-02 ENCOUNTER — Ambulatory Visit (INDEPENDENT_AMBULATORY_CARE_PROVIDER_SITE_OTHER): Payer: PPO | Admitting: Family Medicine

## 2021-02-02 ENCOUNTER — Other Ambulatory Visit: Payer: Self-pay

## 2021-02-02 VITALS — BP 146/78 | HR 53 | Temp 98.1°F | Ht 64.0 in | Wt 312.0 lb

## 2021-02-02 DIAGNOSIS — F3289 Other specified depressive episodes: Secondary | ICD-10-CM | POA: Diagnosis not present

## 2021-02-02 DIAGNOSIS — I10 Essential (primary) hypertension: Secondary | ICD-10-CM

## 2021-02-02 DIAGNOSIS — Z6841 Body Mass Index (BMI) 40.0 and over, adult: Secondary | ICD-10-CM

## 2021-02-02 NOTE — Progress Notes (Signed)
Chief Complaint:   OBESITY James [name] is here to discuss his progress with his obesity treatment plan along with follow-up of his obesity related diagnoses. James [name] is on the Category 4 Plan + 200 calories and states he is following his eating plan approximately 95% of the time. James [name] states he is walking 30 minutes 3 times per week.  Today's visit was #: 22 Starting weight: 332 lbs Starting date: 02/08/2020 Today's weight: 312 lbs Today's date: 02/02/2021 Total lbs lost to date: 20 Total lbs lost since last in-office visit: 3  Interim History: James [name]'s wife has had some health issues recently with recurrent TIA's or seizures and requiring many doctor appts. He is sticking fairly closely to meal plan ( about 95% of the time). The last 5% of the time has been fast food (mostly James [name] for seasonal milk shake). No change needed to be made to plan.  Subjective:   1. Essential hypertension BP well controlled today. Pt denies chest pain/chest pressure/headache.  2. Other depression James [name] is on Cymbalta with AMAZING results. Pt denies suicidal or homicidal ideations. He is in a study.  Assessment/Plan:   1. Essential hypertension James [name] is working on healthy weight loss and exercise to improve blood pressure control. We will watch for signs of hypotension as he continues his lifestyle modifications. Continue current treatment plan.  2. Other depression Behavior modification techniques were discussed today to help James [name] deal with his emotional/non-hunger eating behaviors.  Orders and follow up as documented in patient record. Follow up at next appt.  3. Obesity with current BMI of 53.6  James [name] is currently in the action stage of change. As such, his goal is to continue with weight loss efforts. He has agreed to the Category 4 Plan + 200 calories.   Exercise goals: All adults should avoid inactivity. Some physical activity is better than none, and adults who participate in any amount of  physical activity gain some health benefits. Pt wants to recommit to the gym.  Behavioral modification strategies: increasing lean protein intake, meal planning and cooking strategies, and keeping healthy foods in the home.  James [name] has agreed to follow-up with our clinic in 3-4 weeks. He was informed of the importance of frequent follow-up visits to maximize his success with intensive lifestyle modifications for his multiple health conditions.   Objective:   Blood pressure (!) 146/78, pulse (!) 53, temperature 98.1 F (36.7 C), height 5\' 4"  (1.626 m), weight (!) 312 lb (141.5 kg), SpO2 96 %. Body mass index is 53.55 kg/m.  General: Cooperative, alert, well developed, in no acute distress. HEENT: Conjunctivae and lids unremarkable. Cardiovascular: Regular rhythm.  Lungs: Normal work of breathing. Neurologic: No focal deficits.   Lab Results  Component Value Date   CREATININE 0.96 09/22/2020   BUN 17 09/22/2020   NA 140 09/22/2020   K 4.5 09/22/2020   CL 98 09/22/2020   CO2 28 09/22/2020   Lab Results  Component Value Date   ALT 26 09/22/2020   AST 18 09/22/2020   ALKPHOS 76 09/22/2020   BILITOT 0.4 09/22/2020   Lab Results  Component Value Date   HGBA1C 5.8 (H) 09/22/2020   HGBA1C 5.8 (H) 06/23/2020   HGBA1C 5.7 (H) 01/19/2020   HGBA1C 5.8 10/15/2019   HGBA1C 5.8 01/13/2019   Lab Results  Component Value Date   INSULIN 14.0 09/22/2020   INSULIN 10.7 06/23/2020   INSULIN 23.5 02/08/2020   INSULIN 13.4 04/10/2017   INSULIN 25.3 (H) 01/01/2017  Lab Results  Component Value Date   TSH 2.34 01/19/2020   Lab Results  Component Value Date   CHOL 175 09/22/2020   HDL 50 09/22/2020   LDLCALC 113 (H) 09/22/2020   TRIG 61 09/22/2020   CHOLHDL 3.5 09/22/2020   Lab Results  Component Value Date   VD25OH 63.2 09/22/2020   VD25OH 46.5 06/23/2020   VD25OH 28.1 (L) 02/08/2020   Lab Results  Component Value Date   WBC 7.1 01/19/2020   HGB 14.5 01/19/2020   HCT  44.1 01/19/2020   MCV 89.3 01/19/2020   PLT 208 01/19/2020     Attestation Statements:   Reviewed by clinician on day of visit: allergies, medications, problem list, medical history, surgical history, family history, social history, and previous encounter notes.  Time spent on visit including pre-visit chart review and post-visit care and charting was 15 minutes.   Coral Ceo, CMA, am acting as transcriptionist for Coralie Common, MD.  I have reviewed the above documentation for accuracy and completeness, and I agree with the above. - Coralie Common, MD

## 2021-02-13 ENCOUNTER — Other Ambulatory Visit (INDEPENDENT_AMBULATORY_CARE_PROVIDER_SITE_OTHER): Payer: Self-pay | Admitting: Family Medicine

## 2021-02-13 DIAGNOSIS — F3289 Other specified depressive episodes: Secondary | ICD-10-CM

## 2021-02-22 ENCOUNTER — Other Ambulatory Visit: Payer: Self-pay

## 2021-02-23 ENCOUNTER — Ambulatory Visit (INDEPENDENT_AMBULATORY_CARE_PROVIDER_SITE_OTHER): Payer: PPO | Admitting: Adult Health

## 2021-02-23 ENCOUNTER — Encounter: Payer: Self-pay | Admitting: Adult Health

## 2021-02-23 VITALS — BP 110/80 | HR 75 | Temp 98.5°F | Ht 64.0 in | Wt 316.0 lb

## 2021-02-23 DIAGNOSIS — G4730 Sleep apnea, unspecified: Secondary | ICD-10-CM

## 2021-02-23 DIAGNOSIS — Z125 Encounter for screening for malignant neoplasm of prostate: Secondary | ICD-10-CM

## 2021-02-23 DIAGNOSIS — R7303 Prediabetes: Secondary | ICD-10-CM | POA: Diagnosis not present

## 2021-02-23 DIAGNOSIS — Z23 Encounter for immunization: Secondary | ICD-10-CM

## 2021-02-23 DIAGNOSIS — Z0000 Encounter for general adult medical examination without abnormal findings: Secondary | ICD-10-CM | POA: Diagnosis not present

## 2021-02-23 DIAGNOSIS — I10 Essential (primary) hypertension: Secondary | ICD-10-CM

## 2021-02-23 DIAGNOSIS — Z6841 Body Mass Index (BMI) 40.0 and over, adult: Secondary | ICD-10-CM

## 2021-02-23 LAB — CBC WITH DIFFERENTIAL/PLATELET
Basophils Absolute: 0 10*3/uL (ref 0.0–0.1)
Basophils Relative: 0.4 % (ref 0.0–3.0)
Eosinophils Absolute: 0 10*3/uL (ref 0.0–0.7)
Eosinophils Relative: 0.6 % (ref 0.0–5.0)
HCT: 42 % (ref 39.0–52.0)
Hemoglobin: 14 g/dL (ref 13.0–17.0)
Lymphocytes Relative: 23.4 % (ref 12.0–46.0)
Lymphs Abs: 1.4 10*3/uL (ref 0.7–4.0)
MCHC: 33.4 g/dL (ref 30.0–36.0)
MCV: 88 fl (ref 78.0–100.0)
Monocytes Absolute: 0.5 10*3/uL (ref 0.1–1.0)
Monocytes Relative: 8.2 % (ref 3.0–12.0)
Neutro Abs: 4.1 10*3/uL (ref 1.4–7.7)
Neutrophils Relative %: 67.4 % (ref 43.0–77.0)
Platelets: 163 10*3/uL (ref 150.0–400.0)
RBC: 4.77 Mil/uL (ref 4.22–5.81)
RDW: 14.9 % (ref 11.5–15.5)
WBC: 6 10*3/uL (ref 4.0–10.5)

## 2021-02-23 LAB — LIPID PANEL
Cholesterol: 165 mg/dL (ref 0–200)
HDL: 50.1 mg/dL (ref >39.00)
LDL Cholesterol: 99 mg/dL (ref 0–99)
NonHDL: 114.86
Total CHOL/HDL Ratio: 3
Triglycerides: 77 mg/dL (ref 0.0–149.0)
VLDL: 15.4 mg/dL (ref 0.0–40.0)

## 2021-02-23 LAB — COMPREHENSIVE METABOLIC PANEL
ALT: 24 U/L (ref 0–53)
AST: 18 U/L (ref 0–37)
Albumin: 4.1 g/dL (ref 3.5–5.2)
Alkaline Phosphatase: 47 U/L (ref 39–117)
BUN: 21 mg/dL (ref 6–23)
CO2: 29 mEq/L (ref 19–32)
Calcium: 8.7 mg/dL (ref 8.4–10.5)
Chloride: 101 mEq/L (ref 96–112)
Creatinine, Ser: 0.88 mg/dL (ref 0.40–1.50)
GFR: 89.16 mL/min (ref >60.00)
Glucose, Bld: 101 mg/dL — ABNORMAL HIGH (ref 70–99)
Potassium: 3.9 mEq/L (ref 3.5–5.1)
Sodium: 137 mEq/L (ref 135–145)
Total Bilirubin: 0.5 mg/dL (ref 0.2–1.2)
Total Protein: 6.7 g/dL (ref 6.0–8.3)

## 2021-02-23 LAB — HEMOGLOBIN A1C: Hgb A1c MFr Bld: 5.7 % (ref 4.6–6.5)

## 2021-02-23 LAB — TSH: TSH: 2.39 u[IU]/mL (ref 0.35–5.50)

## 2021-02-23 LAB — PSA: PSA: 0.65 ng/mL (ref 0.10–4.00)

## 2021-02-23 NOTE — Addendum Note (Signed)
Addended by: Amanda Cockayne on: 02/23/2021 08:30 AM   Modules accepted: Orders

## 2021-02-23 NOTE — Progress Notes (Signed)
Subjective:    Patient ID: James [name], male    DOB: 23-Jun-1955, 65 y.o.   MRN: [id number]  HPI Patient presents for yearly preventative medicine examination. He is a pleasant 65 year old male who  has a past medical history of Arthritis, Bilateral swelling of feet, Chicken pox, Complication of anesthesia, Depression, Hypertension, Joint pain, Obesity, Osteoarthritis, Prediabetes, Shortness of breath, Swelling, and Vitamin D deficiency.  Essential hypertension-prescribed lisinopril 20 mg daily and HCTZ 25 mg daily.  He denies dizziness, lightheadedness, chest pain, shortness of breath  BP Readings from Last 3 Encounters:  02/23/21 110/80  02/02/21 (!) 146/78  01/17/21 120/70   Weight loss management-currently being seen by [facility].  Has lost roughly 20 pounds since starting the program roughly a year ago.  He is following their eating plan approximately 95% of the time and walking 30 minutes 3 times a week.  Glucose intolerance-takes 500 mg of metformin daily with lunch  OSA -is managed by pulmonary.  Does use his CPAP nightly since starting with his CPAP his energy level has become significantly better and he wakes up feeling like he gets a good nights rest.  All immunizations and health maintenance protocols were reviewed with the patient and needed orders were placed.  Appropriate screening laboratory values were ordered for the patient including screening of hyperlipidemia, renal function and hepatic function. If indicated by BPH, a PSA was ordered.  Medication reconciliation,  past medical history, social history, problem list and allergies were reviewed in detail with the patient  Goals were established with regard to weight loss, exercise, and  diet in compliance with medications Wt Readings from Last 3 Encounters:  02/23/21 (!) 316 lb (143.3 kg)  02/02/21 (!) 312 lb (141.5 kg)  01/17/21 (!) 319 lb 9.6 oz (145 kg)    He is up to date on routine colon  cancer screening   Review of Systems  Constitutional: Negative.   HENT: Negative.    Eyes: Negative.   Respiratory: Negative.    Cardiovascular: Negative.   Gastrointestinal: Negative.   Endocrine: Negative.   Genitourinary: Negative.   Musculoskeletal: Negative.   Skin: Negative.   Allergic/Immunologic: Negative.   Neurological: Negative.   Hematological: Negative.   Psychiatric/Behavioral: Negative.    All other systems reviewed and are negative.  Past Medical History:  Diagnosis Date   Arthritis    Bilateral swelling of feet    Chicken pox    Complication of anesthesia    difficulty waking up   Depression    Hypertension    Joint pain    Obesity    Osteoarthritis    Prediabetes    Shortness of breath    Swelling    feet and legs   Vitamin D deficiency     Social History   Socioeconomic History   Marital status: Married    Spouse name: Not on file   Number of children: Not on file   Years of education: Not on file   Highest education level: Not on file  Occupational History   Occupation: At home caregiver  Tobacco Use   Smoking status: Former    Types: Cigarettes    Quit date: 07/16/1992    Years since quitting: 28.6   Smokeless tobacco: Never  Substance and Sexual Activity   Alcohol use: Yes    Comment: very little per patient    Drug use: No   Sexual activity: Not on file  Other Topics Concern  Not on file  Social History Narrative   Work or School: self employed, 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      Home Situation: living with and caring for wife whom had stroke and brain tumor      Spiritual Beliefs: Methodist      Lifestyle: no regular exercise, diet poor            Social Determinants of Health   Financial Resource Strain: Low Risk    Difficulty of Paying Living Expenses: Not hard at all  Food Insecurity: No Food Insecurity   Worried About Charity fundraiser in the Last Year: Never true   Arboriculturist in the Last Year: Never  true  Transportation Needs: No Transportation Needs   Lack of Transportation (Medical): No   Lack of Transportation (Non-Medical): No  Physical Activity: Insufficiently Active   Days of Exercise per Week: 3 days   Minutes of Exercise per Session: 30 min  Stress: No Stress Concern Present   Feeling of Stress : Not at all  Social Connections: Socially Isolated   Frequency of Communication with Friends and Family: Once a week   Frequency of Social Gatherings with Friends and Family: Never   Attends Religious Services: Never   Marine scientist or Organizations: No   Attends Archivist Meetings: Never   Marital Status: Married  Human resources officer Violence: Not At Risk   Fear of Current or Ex-Partner: No   Emotionally Abused: No   Physically Abused: No   Sexually Abused: No    Past Surgical History:  Procedure Laterality Date   cartilage removal     COLONOSCOPY WITH PROPOFOL N/A 12/14/2019   Procedure: COLONOSCOPY WITH PROPOFOL;  Surgeon: Doran Stabler, MD;  Location: [facility];  Service: Gastroenterology;  Laterality: N/A;   KNEE SURGERY  2000   POLYPECTOMY  12/14/2019   Procedure: POLYPECTOMY;  Surgeon: Doran Stabler, MD;  Location: [facility];  Service: Gastroenterology;;   TOTAL HIP ARTHROPLASTY Left 02/11/2019   Procedure: TOTAL HIP [place];  Surgeon: Gaynelle Arabian, MD;  Location: [facility];  Service: Orthopedics;  Laterality: Left;  156min    Family History  Problem Relation Age of Onset   Cancer Mother        lung and soft tissue cancer from radiation   Stroke Mother    Obesity Mother    Heart disease Father        CHF   Diabetes Father    Hypertension Father    Obesity Father    Heart disease Maternal Grandfather    Diabetes Paternal Grandmother    Ovarian cancer Sister    Colon cancer Neg Hx    Colon polyps Neg Hx    Esophageal cancer Neg Hx    Rectal cancer Neg Hx    Stomach cancer Neg Hx     No Known  Allergies  Current Outpatient Medications on File Prior to Visit  Medication Sig Dispense Refill   cholecalciferol (VITAMIN D3) 25 MCG (1000 UNIT) tablet Take 2,000 Units by mouth daily.     DULoxetine (CYMBALTA) 60 MG capsule Take 60 mg by mouth daily.     fluticasone (FLONASE) 50 MCG/ACT nasal spray Place 1 spray into both nostrils daily.      hydrochlorothiazide (HYDRODIURIL) 25 MG tablet TAKE 1 TABLET (25 MG TOTAL) BY MOUTH DAILY. 90 tablet 1   Ketotifen Fumarate (ALLERGY EYE DROPS OP) Place 1 drop into both eyes daily  as needed (allergies).     lisinopril (ZESTRIL) 10 MG tablet TAKE 2 TABLETS BY MOUTH EVERY DAY 180 tablet 1   metFORMIN (GLUCOPHAGE) 500 MG tablet One tablet daily with lunch 30 tablet 0   Multiple Vitamin (MULTIVITAMIN) tablet Take 1 tablet by mouth daily.     acetaminophen (TYLENOL) 500 MG tablet Take 1,000 mg by mouth at bedtime as needed for moderate pain. (Patient not taking: Reported on 02/23/2021)     amoxicillin (AMOXIL) 500 MG capsule Take 2,000 mg by mouth See admin instructions. Take 2000 mg 1 hour prior to dental work (Patient not taking: Reported on 02/23/2021)     cetirizine (ZYRTEC) 10 MG tablet Take 10 mg by mouth daily. (Patient not taking: Reported on 02/23/2021)     DULoxetine (CYMBALTA) 30 MG capsule Take 30 mg by mouth daily.     ibuprofen (ADVIL) 200 MG tablet Take 400 mg by mouth at bedtime as needed for moderate pain.  (Patient not taking: Reported on 02/23/2021)     lidocaine (LMX) 4 % cream Apply 1 application topically daily as needed (pain). (Patient not taking: Reported on 02/23/2021)     No current facility-administered medications on file prior to visit.    BP 110/80   Pulse 75   Temp 98.5 F (36.9 C) (Oral)   Ht 5\' 4"  (1.626 m)   Wt (!) 316 lb (143.3 kg)   SpO2 95%   BMI 54.24 kg/m        Objective:   Physical Exam Vitals and nursing note reviewed.  Constitutional:      General: He is not in acute distress.    Appearance:  Normal appearance. He is well-developed. He is obese.  HENT:     Head: Normocephalic and atraumatic.     Right Ear: Tympanic membrane, ear canal and external ear normal. There is no impacted cerumen.     Left Ear: Tympanic membrane, ear canal and external ear normal. There is no impacted cerumen.     Nose: Nose normal. No congestion or rhinorrhea.     Mouth/Throat:     Mouth: Mucous membranes are moist.     Pharynx: Oropharynx is clear. No oropharyngeal exudate or posterior oropharyngeal erythema.  Eyes:     General:        Right eye: No discharge.        Left eye: No discharge.     Extraocular Movements: Extraocular movements intact.     Conjunctiva/sclera: Conjunctivae normal.     Pupils: Pupils are equal, round, and reactive to light.  Neck:     Vascular: No carotid bruit.     Trachea: No tracheal deviation.  Cardiovascular:     Rate and Rhythm: Normal rate and regular rhythm.     Pulses: Normal pulses.     Heart sounds: Normal heart sounds. No murmur heard.   No friction rub. No gallop.  Pulmonary:     Effort: Pulmonary effort is normal. No respiratory distress.     Breath sounds: Normal breath sounds. No stridor. No wheezing, rhonchi or rales.  Chest:     Chest wall: No tenderness.  Abdominal:     General: Bowel sounds are normal. There is no distension.     Palpations: Abdomen is soft. There is no mass.     Tenderness: There is no abdominal tenderness. There is no right CVA tenderness, left CVA tenderness, guarding or rebound.     Hernia: No hernia is present.  Musculoskeletal:  General: No swelling, tenderness, deformity or signs of injury. Normal range of motion.     Right lower leg: No edema.     Left lower leg: No edema.  Lymphadenopathy:     Cervical: No cervical adenopathy.  Skin:    General: Skin is warm and dry.     Capillary Refill: Capillary refill takes less than 2 seconds.     Coloration: Skin is not jaundiced or pale.     Findings: No bruising,  erythema, lesion or rash.  Neurological:     General: No focal deficit present.     Mental Status: He is alert and oriented to person, place, and time.     Cranial Nerves: No cranial nerve deficit.     Sensory: No sensory deficit.     Motor: No weakness.     Coordination: Coordination normal.     Gait: Gait normal.     Deep Tendon Reflexes: Reflexes normal.  Psychiatric:        Mood and Affect: Mood normal.        Behavior: Behavior normal.        Thought Content: Thought content normal.        Judgment: Judgment normal.       Assessment & Plan:  1. Routine general medical examination at a health care facility - Continue with weight loss measures - Follow up in one year or sooner if needed - CBC with Differential/Platelet; Future - Comprehensive metabolic panel; Future - Hemoglobin A1c; Future - Lipid panel; Future - TSH; Future  2. Essential hypertension - Well controlled. No change in medications  - CBC with Differential/Platelet; Future - Comprehensive metabolic panel; Future - Hemoglobin A1c; Future - Lipid panel; Future - TSH; Future  3. Obesity with current BMI of 53.6 - Follow up with healthy weight and wellness as directed - CBC with Differential/Platelet; Future - Comprehensive metabolic panel; Future - Hemoglobin A1c; Future - Lipid panel; Future - TSH; Future  4. Pre-diabetes - Continue with metformin  - CBC with Differential/Platelet; Future - Comprehensive metabolic panel; Future - Hemoglobin A1c; Future - Lipid panel; Future - TSH; Future - Insulin, random; Future   5. Sleep apnea, unspecified type - Continue with CPAP   6. Prostate cancer screening  - PSA; Future  7. Need for immunization against influenza  - Flu Vaccine QUAD 21mo+IM (Fluarix, Fluzone & Alfiuria Quad PF)  Dorothyann Peng, NP

## 2021-02-23 NOTE — Patient Instructions (Signed)
It was great seeing you today   We will follow up with you regarding your blood work   Please follow up in one year or sooner if needed

## 2021-02-24 LAB — INSULIN, RANDOM: Insulin: 17.7 u[IU]/mL

## 2021-02-27 ENCOUNTER — Ambulatory Visit (INDEPENDENT_AMBULATORY_CARE_PROVIDER_SITE_OTHER): Payer: PPO | Admitting: Family Medicine

## 2021-02-27 ENCOUNTER — Encounter (INDEPENDENT_AMBULATORY_CARE_PROVIDER_SITE_OTHER): Payer: Self-pay | Admitting: Family Medicine

## 2021-02-27 ENCOUNTER — Other Ambulatory Visit: Payer: Self-pay

## 2021-02-27 VITALS — BP 131/76 | HR 85 | Temp 97.9°F | Ht 64.0 in | Wt 312.0 lb

## 2021-02-27 DIAGNOSIS — Z6841 Body Mass Index (BMI) 40.0 and over, adult: Secondary | ICD-10-CM | POA: Diagnosis not present

## 2021-02-27 DIAGNOSIS — I10 Essential (primary) hypertension: Secondary | ICD-10-CM

## 2021-02-27 DIAGNOSIS — E559 Vitamin D deficiency, unspecified: Secondary | ICD-10-CM | POA: Diagnosis not present

## 2021-02-27 NOTE — Progress Notes (Signed)
Chief Complaint:   OBESITY James [name] is here to discuss his progress with his obesity treatment plan along with follow-up of his obesity related diagnoses. James [name] is on the Category 4 Plan + 200 calories and states he is following his eating plan approximately 100% of the time. James [name] states he is doing various exercises 30 minutes 3 times per week.  Today's visit was #: 20 Starting weight: 332 lbs Starting date: 02/08/2020 Today's weight: 312 lbs Today's date: 02/27/2021 Total lbs lost to date: 20 Total lbs lost since last in-office visit: 0  Interim History: James [name] is feeling dizzy this morning, and voices that this occasionally happens and is wondering about sinus issues. Prior to this morning, pt was feeling pretty well. He has increased Cymbalta to 60 mg and is sticking with the plan most of the time. He hasn't been as back to exercising as much due to wife's medical needs.  Subjective:   1. Essential hypertension BP well controlled today. Pt denies chest pain/chest pressure/headache.  2. Vitamin D deficiency James [name] is on OTC Vit D 2,000 IU daily and reports fatigue.  Assessment/Plan:   1. Essential hypertension James [name] is working on healthy weight loss and exercise to improve blood pressure control. We will watch for signs of hypotension as he continues his lifestyle modifications. Continue current meds with no change in doses.  2. Vitamin D deficiency Low Vitamin D level contributes to fatigue and are associated with obesity, breast, and colon cancer. He agrees to continue to take OTC Vitamin D 2,000 IU QD and will follow-up for routine testing of Vitamin D, at least 2-3 times per year to avoid over-replacement.  3. Obesity with current BMI of 53.7  James [name] is currently in the action stage of change. As such, his goal is to continue with weight loss efforts. He has agreed to the Category 4 Plan + 200 calories.   Exercise goals: All adults should avoid inactivity. Some physical  activity is better than none, and adults who participate in any amount of physical activity gain some health benefits.  Behavioral modification strategies: increasing lean protein intake, meal planning and cooking strategies, and keeping healthy foods in the home.  James [name] has agreed to follow-up with our clinic in 3 weeks. He was informed of the importance of frequent follow-up visits to maximize his success with intensive lifestyle modifications for his multiple health conditions.   Objective:   Blood pressure 131/76, pulse 85, temperature 97.9 F (36.6 C), height 5\' 4"  (1.626 m), weight (!) 312 lb (141.5 kg), SpO2 95 %. Body mass index is 53.55 kg/m.  General: Cooperative, alert, well developed, in no acute distress. HEENT: Conjunctivae and lids unremarkable. Cardiovascular: Regular rhythm.  Lungs: Normal work of breathing. Neurologic: No focal deficits.   Lab Results  Component Value Date   CREATININE 0.88 02/23/2021   BUN 21 02/23/2021   NA 137 02/23/2021   K 3.9 02/23/2021   CL 101 02/23/2021   CO2 29 02/23/2021   Lab Results  Component Value Date   ALT 24 02/23/2021   AST 18 02/23/2021   ALKPHOS 47 02/23/2021   BILITOT 0.5 02/23/2021   Lab Results  Component Value Date   HGBA1C 5.7 02/23/2021   HGBA1C 5.8 (H) 09/22/2020   HGBA1C 5.8 (H) 06/23/2020   HGBA1C 5.7 (H) 01/19/2020   HGBA1C 5.8 10/15/2019   Lab Results  Component Value Date   INSULIN 14.0 09/22/2020   INSULIN 10.7 06/23/2020   INSULIN 23.5 02/08/2020  INSULIN 13.4 04/10/2017   INSULIN 25.3 (H) 01/01/2017   Lab Results  Component Value Date   TSH 2.39 02/23/2021   Lab Results  Component Value Date   CHOL 165 02/23/2021   HDL 50.10 02/23/2021   LDLCALC 99 02/23/2021   TRIG 77.0 02/23/2021   CHOLHDL 3 02/23/2021   Lab Results  Component Value Date   VD25OH 63.2 09/22/2020   VD25OH 46.5 06/23/2020   VD25OH 28.1 (L) 02/08/2020   Lab Results  Component Value Date   WBC 6.0 02/23/2021    HGB 14.0 02/23/2021   HCT 42.0 02/23/2021   MCV 88.0 02/23/2021   PLT 163.0 02/23/2021    Attestation Statements:   Reviewed by clinician on day of visit: allergies, medications, problem list, medical history, surgical history, family history, social history, and previous encounter notes.  Time spent on visit including pre-visit chart review and post-visit care and charting was 15 minutes.   Coral Ceo, CMA, am acting as transcriptionist for Coralie Common, MD.   I have reviewed the above documentation for accuracy and completeness, and I agree with the above. - Coralie Common, MD

## 2021-03-01 DIAGNOSIS — G4733 Obstructive sleep apnea (adult) (pediatric): Secondary | ICD-10-CM | POA: Diagnosis not present

## 2021-03-16 ENCOUNTER — Other Ambulatory Visit (INDEPENDENT_AMBULATORY_CARE_PROVIDER_SITE_OTHER): Payer: Self-pay | Admitting: Family Medicine

## 2021-03-16 ENCOUNTER — Encounter (INDEPENDENT_AMBULATORY_CARE_PROVIDER_SITE_OTHER): Payer: Self-pay

## 2021-03-16 DIAGNOSIS — R7303 Prediabetes: Secondary | ICD-10-CM

## 2021-03-16 NOTE — Telephone Encounter (Signed)
Message sent to pt-[facility] 

## 2021-03-16 NOTE — Telephone Encounter (Signed)
Pt last seen by Dr. Ukleja.  

## 2021-03-20 ENCOUNTER — Encounter (INDEPENDENT_AMBULATORY_CARE_PROVIDER_SITE_OTHER): Payer: Self-pay | Admitting: Family Medicine

## 2021-03-20 ENCOUNTER — Ambulatory Visit (INDEPENDENT_AMBULATORY_CARE_PROVIDER_SITE_OTHER): Payer: PPO | Admitting: Family Medicine

## 2021-03-20 ENCOUNTER — Other Ambulatory Visit: Payer: Self-pay

## 2021-03-20 VITALS — BP 136/63 | HR 55 | Temp 97.8°F | Ht 64.0 in | Wt 312.0 lb

## 2021-03-20 DIAGNOSIS — E7849 Other hyperlipidemia: Secondary | ICD-10-CM | POA: Diagnosis not present

## 2021-03-20 DIAGNOSIS — R7303 Prediabetes: Secondary | ICD-10-CM

## 2021-03-20 DIAGNOSIS — Z6841 Body Mass Index (BMI) 40.0 and over, adult: Secondary | ICD-10-CM | POA: Diagnosis not present

## 2021-03-20 MED ORDER — METFORMIN HCL 500 MG PO TABS: 500.0000 mg | ORAL_TABLET | Freq: Every day | ORAL | 0 refills | Status: DC

## 2021-03-20 NOTE — Progress Notes (Signed)
Chief Complaint:   OBESITY James [name] is here to discuss his progress with his obesity treatment plan along with follow-up of his obesity related diagnoses. James [name] is on the Category 4 Plan + 200 calories and states he is following his eating plan approximately 95% of the time. James [name] states he is walking 30 minutes 5 times per week.  Today's visit was #: 21 Starting weight: 332 lbs Starting date: 02/08/2020 Today's weight: 312 lbs Today's date: 03/20/2021 Total lbs lost to date: 20 Total lbs lost since last in-office visit: 0  Interim History: James [name] has been eating on plan about 95% of the time. He hasn't been able to get to the gym but has been doing exercises at home. He note significant health issues and stress concerning his wife. His family is coming to his house for Thanksgiving. Pt voices they are planning on lots of activities outside of food. He wants to be mindful of carb intake over the next few weeks. Pt thinks he is getting all snack calories and protein.  Subjective:   1. Pre-diabetes James [name] is on Metformin and denies GI side effects. His last A1c was 5.7.  2. Other hyperlipidemia Pt's last LDL was 99, HDL 50, and triglycerides 77. He is not on statin therapy.  Assessment/Plan:   1. Pre-diabetes James [name] will continue to work on weight loss, exercise, and decreasing simple carbohydrates to help decrease the risk of diabetes.   Refill- metFORMIN (GLUCOPHAGE) 500 MG tablet; Take 1 tablet (500 mg total) by mouth daily. One tablet daily with lunch  Dispense: 90 tablet; Refill: 0  2. Other hyperlipidemia Cardiovascular risk and specific lipid/LDL goals reviewed.  We discussed several lifestyle modifications today and James [name] will continue to work on diet, exercise and weight loss efforts. Orders and follow up as documented in patient record. Follow up labs in 3 months. At goal now.  Counseling Intensive lifestyle modifications are the first line treatment for this  issue. Dietary changes: Increase soluble fiber. Decrease simple carbohydrates. Exercise changes: Moderate to vigorous-intensity aerobic activity 150 minutes per week if tolerated. Lipid-lowering medications: see documented in medical record.  3. Class 3 severe obesity with serious comorbidity and body mass index (BMI) of 50.0 to 59.9 in adult, unspecified obesity type James [name])  James [name] is currently in the action stage of change. As such, his goal is to continue with weight loss efforts. He has agreed to the Category 4 Plan + 200 calories.  Handout: Holiday Recipes   Exercise goals: All adults should avoid inactivity. Some physical activity is better than none, and adults who participate in any amount of physical activity gain some health benefits.  Behavioral modification strategies: increasing lean protein intake, meal planning and cooking strategies, and keeping healthy foods in the home.  James [name] has agreed to follow-up with our clinic in 3 weeks. He was informed of the importance of frequent follow-up visits to maximize his success with intensive lifestyle modifications for his multiple health conditions.   Objective:   Blood pressure 136/63, pulse (!) 55, temperature 97.8 F (36.6 C), height 5\' 4"  (1.626 m), weight (!) 312 lb (141.5 kg), SpO2 97 %. Body mass index is 53.55 kg/m.  General: Cooperative, alert, well developed, in no acute distress. HEENT: Conjunctivae and lids unremarkable. Cardiovascular: Regular rhythm.  Lungs: Normal work of breathing. Neurologic: No focal deficits.   Lab Results  Component Value Date   CREATININE 0.88 02/23/2021   BUN 21 02/23/2021   NA 137 02/23/2021   K  3.9 02/23/2021   CL 101 02/23/2021   CO2 29 02/23/2021   Lab Results  Component Value Date   ALT 24 02/23/2021   AST 18 02/23/2021   ALKPHOS 47 02/23/2021   BILITOT 0.5 02/23/2021   Lab Results  Component Value Date   HGBA1C 5.7 02/23/2021   HGBA1C 5.8 (H) 09/22/2020   HGBA1C 5.8 (H)  06/23/2020   HGBA1C 5.7 (H) 01/19/2020   HGBA1C 5.8 10/15/2019   Lab Results  Component Value Date   INSULIN 14.0 09/22/2020   INSULIN 10.7 06/23/2020   INSULIN 23.5 02/08/2020   INSULIN 13.4 04/10/2017   INSULIN 25.3 (H) 01/01/2017   Lab Results  Component Value Date   TSH 2.39 02/23/2021   Lab Results  Component Value Date   CHOL 165 02/23/2021   HDL 50.10 02/23/2021   LDLCALC 99 02/23/2021   TRIG 77.0 02/23/2021   CHOLHDL 3 02/23/2021   Lab Results  Component Value Date   VD25OH 63.2 09/22/2020   VD25OH 46.5 06/23/2020   VD25OH 28.1 (L) 02/08/2020   Lab Results  Component Value Date   WBC 6.0 02/23/2021   HGB 14.0 02/23/2021   HCT 42.0 02/23/2021   MCV 88.0 02/23/2021   PLT 163.0 02/23/2021    Attestation Statements:   Reviewed by clinician on day of visit: allergies, medications, problem list, medical history, surgical history, family history, social history, and previous encounter notes.  Coral Ceo, CMA, am acting as transcriptionist for Coralie Common, MD.   I have reviewed the above documentation for accuracy and completeness, and I agree with the above. - Coralie Common, MD

## 2021-03-29 ENCOUNTER — Encounter: Payer: Self-pay | Admitting: Adult Health

## 2021-03-29 NOTE — Telephone Encounter (Signed)
FYI

## 2021-04-01 DIAGNOSIS — G4733 Obstructive sleep apnea (adult) (pediatric): Secondary | ICD-10-CM | POA: Diagnosis not present

## 2021-04-11 ENCOUNTER — Other Ambulatory Visit: Payer: Self-pay

## 2021-04-11 DIAGNOSIS — J309 Allergic rhinitis, unspecified: Secondary | ICD-10-CM | POA: Diagnosis not present

## 2021-04-11 DIAGNOSIS — I10 Essential (primary) hypertension: Secondary | ICD-10-CM

## 2021-04-11 DIAGNOSIS — R6889 Other general symptoms and signs: Secondary | ICD-10-CM | POA: Diagnosis not present

## 2021-04-11 MED ORDER — HYDROCHLOROTHIAZIDE 25 MG PO TABS: 25.0000 mg | ORAL_TABLET | Freq: Every day | ORAL | 0 refills | Status: DC

## 2021-04-11 MED ORDER — LISINOPRIL 10 MG PO TABS: 20.0000 mg | ORAL_TABLET | Freq: Every day | ORAL | 0 refills | Status: DC

## 2021-04-13 ENCOUNTER — Telehealth (INDEPENDENT_AMBULATORY_CARE_PROVIDER_SITE_OTHER): Payer: PPO | Admitting: Family Medicine

## 2021-04-17 ENCOUNTER — Telehealth (INDEPENDENT_AMBULATORY_CARE_PROVIDER_SITE_OTHER): Payer: PPO | Admitting: Family Medicine

## 2021-04-17 ENCOUNTER — Encounter (INDEPENDENT_AMBULATORY_CARE_PROVIDER_SITE_OTHER): Payer: Self-pay | Admitting: Family Medicine

## 2021-04-17 DIAGNOSIS — R7303 Prediabetes: Secondary | ICD-10-CM | POA: Diagnosis not present

## 2021-04-17 DIAGNOSIS — Z6841 Body Mass Index (BMI) 40.0 and over, adult: Secondary | ICD-10-CM

## 2021-04-17 DIAGNOSIS — I10 Essential (primary) hypertension: Secondary | ICD-10-CM | POA: Diagnosis not present

## 2021-04-17 MED ORDER — METFORMIN HCL 500 MG PO TABS: 500.0000 mg | ORAL_TABLET | Freq: Every day | ORAL | 0 refills | Status: DC

## 2021-04-18 NOTE — Progress Notes (Signed)
TeleHealth Visit:  Due to the COVID-19 pandemic, this visit was completed with telemedicine (audio/video) technology to reduce patient and provider exposure as well as to preserve personal protective equipment.   James [name] has verbally consented to this TeleHealth visit. The patient is located at home, the provider is located at the [facility] office. The participants in this visit include the listed provider and patient. The visit was conducted today via video.   Chief Complaint: OBESITY James [name] is here to discuss his progress with his obesity treatment plan along with follow-up of his obesity related diagnoses. James [name] is on the Category 4 Plan and states he is following his eating plan approximately 90% of the time. James [name] states he is walking 20 minutes 3-4 times per week.  Today's visit was #: 22 Starting weight: 332 lbs Starting date: 02/08/2020  Interim History: James [name] is feeling under the weather. His sinuses are congested and he hasn't been able to wear his CPAP. He had a great Thanksgiving with kids and grandkids. Family is coming back over for Christmas for 5-6 days. Pt wants to stick as close as he can to the plan over the next few weeks.  Subjective:   1. Pre-diabetes James [name]'s last A1c was 5.7 and insulin level 17.7. He is on Metformin with no GI side effects.  2. Essential hypertension BP well controlled at home- 110-127/66-77 at home. Pt denies chest pain/chest pressure/headache.  Assessment/Plan:   1. Pre-diabetes James [name] will continue to work on weight loss, exercise, and decreasing simple carbohydrates to help decrease the risk of diabetes. Continue Metformin.  Refill- metFORMIN (GLUCOPHAGE) 500 MG tablet; Take 1 tablet (500 mg total) by mouth daily. One tablet daily with lunch  Dispense: 90 tablet; Refill: 0  2. Essential hypertension James [name] is working on healthy weight loss and exercise to improve blood pressure control. We will watch for signs of  hypotension as he continues his lifestyle modifications. Follow up on BP at home and subsequent appt.  3. Obesity with current BMI of 53.55  James [name] is currently in the action stage of change. As such, his goal is to continue with weight loss efforts. He has agreed to the Category 4 Plan.   Exercise goals: All adults should avoid inactivity. Some physical activity is better than none, and adults who participate in any amount of physical activity gain some health benefits.  Behavioral modification strategies: increasing lean protein intake, meal planning and cooking strategies, keeping healthy foods in the home, and planning for success.  James [name] has agreed to follow-up with our clinic in 3-4 weeks. He was informed of the importance of frequent follow-up visits to maximize his success with intensive lifestyle modifications for his multiple health conditions.  Objective:   VITALS: Per patient if applicable, see vitals. GENERAL: Alert and in no acute distress. CARDIOPULMONARY: No increased WOB. Speaking in clear sentences.  PSYCH: Pleasant and cooperative. Speech normal rate and rhythm. Affect is appropriate. Insight and judgement are appropriate. Attention is focused, linear, and appropriate.  NEURO: Oriented as arrived to appointment on time with no prompting.   Lab Results  Component Value Date   CREATININE 0.88 02/23/2021   BUN 21 02/23/2021   NA 137 02/23/2021   K 3.9 02/23/2021   CL 101 02/23/2021   CO2 29 02/23/2021   Lab Results  Component Value Date   ALT 24 02/23/2021   AST 18 02/23/2021   ALKPHOS 47 02/23/2021   BILITOT 0.5 02/23/2021   Lab Results  Component  Value Date   HGBA1C 5.7 02/23/2021   HGBA1C 5.8 (H) 09/22/2020   HGBA1C 5.8 (H) 06/23/2020   HGBA1C 5.7 (H) 01/19/2020   HGBA1C 5.8 10/15/2019   Lab Results  Component Value Date   INSULIN 14.0 09/22/2020   INSULIN 10.7 06/23/2020   INSULIN 23.5 02/08/2020   INSULIN 13.4 04/10/2017   INSULIN 25.3 (H)  01/01/2017   Lab Results  Component Value Date   TSH 2.39 02/23/2021   Lab Results  Component Value Date   CHOL 165 02/23/2021   HDL 50.10 02/23/2021   LDLCALC 99 02/23/2021   TRIG 77.0 02/23/2021   CHOLHDL 3 02/23/2021   Lab Results  Component Value Date   VD25OH 63.2 09/22/2020   VD25OH 46.5 06/23/2020   VD25OH 28.1 (L) 02/08/2020   Lab Results  Component Value Date   WBC 6.0 02/23/2021   HGB 14.0 02/23/2021   HCT 42.0 02/23/2021   MCV 88.0 02/23/2021   PLT 163.0 02/23/2021   No results found for: IRON, TIBC, FERRITIN  Attestation Statements:   Reviewed by clinician on day of visit: allergies, medications, problem list, medical history, surgical history, family history, social history, and previous encounter notes.  Coral Ceo, CMA, am acting as transcriptionist for Coralie Common, MD.  I have reviewed the above documentation for accuracy and completeness, and I agree with the above. - Coralie Common, MD

## 2021-04-20 DIAGNOSIS — J029 Acute pharyngitis, unspecified: Secondary | ICD-10-CM | POA: Diagnosis not present

## 2021-04-20 DIAGNOSIS — R0602 Shortness of breath: Secondary | ICD-10-CM | POA: Diagnosis not present

## 2021-04-20 DIAGNOSIS — R051 Acute cough: Secondary | ICD-10-CM | POA: Diagnosis not present

## 2021-04-20 DIAGNOSIS — R059 Cough, unspecified: Secondary | ICD-10-CM | POA: Diagnosis not present

## 2021-04-21 ENCOUNTER — Encounter: Payer: Self-pay | Admitting: Adult Health

## 2021-05-01 DIAGNOSIS — G4733 Obstructive sleep apnea (adult) (pediatric): Secondary | ICD-10-CM | POA: Diagnosis not present

## 2021-05-26 ENCOUNTER — Ambulatory Visit (INDEPENDENT_AMBULATORY_CARE_PROVIDER_SITE_OTHER): Payer: PPO

## 2021-05-26 VITALS — Ht 64.0 in | Wt 312.0 lb

## 2021-05-26 DIAGNOSIS — Z0000 Encounter for general adult medical examination without abnormal findings: Secondary | ICD-10-CM | POA: Diagnosis not present

## 2021-05-26 NOTE — Patient Instructions (Addendum)
James [name] , Thank you for taking time to come for your Medicare Wellness Visit. I appreciate your ongoing commitment to your health goals. Please review the following plan we discussed and let me know if I can assist you in the future.   These are the goals we discussed:  Goals      Exercise 150 min/wk Moderate Activity     Patient wants to continue to exercise.(Walking 3 days per week X 30 min.)        This is a list of the screening recommended for you and due dates:  Health Maintenance  Topic Date Due   COVID-19 Vaccine (5 - Booster for Pfizer series) 10/25/2020   Zoster (Shingles) Vaccine (1 of 2) 08/24/2021*   Stool Blood Test  11/23/2021*   Hepatitis C Screening: USPSTF Recommendation to screen - Ages 18-79 yo.  09/12/2025*   Cologuard (Stool DNA test)  10/15/2022   Tetanus Vaccine  05/16/2025   Pneumonia Vaccine  Completed   Flu Shot  Completed   HPV Vaccine  Aged Out  *Topic was postponed. The date shown is not the original due date.    Advanced directives: Yes  Conditions/risks identified: None  Next appointment: Follow up in one year for your annual wellness visit.  Preventive Care 68 Years and Older, Male Preventive care refers to lifestyle choices and visits with your health care provider that can promote health and wellness. What does preventive care include? A yearly physical exam. This is also called an annual well check. Dental exams once or twice a year. Routine eye exams. Ask your health care provider how often you should have your eyes checked. Personal lifestyle choices, including: Daily care of your teeth and gums. Regular physical activity. Eating a healthy diet. Avoiding tobacco and drug use. Limiting alcohol use. Practicing safe sex. Taking low doses of aspirin every day. Taking vitamin and mineral supplements as recommended by your health care provider. What happens during an annual well check? The services and screenings done by your  health care provider during your annual well check will depend on your age, overall health, lifestyle risk factors, and family history of disease. Counseling  Your health care provider may ask you questions about your: Alcohol use. Tobacco use. Drug use. Emotional well-being. Home and relationship well-being. Sexual activity. Eating habits. History of falls. Memory and ability to understand (cognition). Work and work Statistician. Screening  You may have the following tests or measurements: Height, weight, and BMI. Blood pressure. Lipid and cholesterol levels. These may be checked every 5 years, or more frequently if you are over 76 years old. Skin check. Lung cancer screening. You may have this screening every year starting at age 74 if you have a 30-pack-year history of smoking and currently smoke or have quit within the past 15 years. Fecal occult blood test (FOBT) of the stool. You may have this test every year starting at age 37. Flexible sigmoidoscopy or colonoscopy. You may have a sigmoidoscopy every 5 years or a colonoscopy every 10 years starting at age 61. Prostate cancer screening. Recommendations will vary depending on your family history and other risks. Hepatitis C blood test. Hepatitis B blood test. Sexually transmitted disease (STD) testing. Diabetes screening. This is done by checking your blood sugar (glucose) after you have not eaten for a while (fasting). You may have this done every 1-3 years. Abdominal aortic aneurysm (AAA) screening. You may need this if you are a current or former smoker. Osteoporosis. You may  be screened starting at age 31 if you are at high risk. Talk with your health care provider about your test results, treatment options, and if necessary, the need for more tests. Vaccines  Your health care provider may recommend certain vaccines, such as: Influenza vaccine. This is recommended every year. Tetanus, diphtheria, and acellular pertussis  (Tdap, Td) vaccine. You may need a Td booster every 10 years. Zoster vaccine. You may need this after age 40. Pneumococcal 13-valent conjugate (PCV13) vaccine. One dose is recommended after age 33. Pneumococcal polysaccharide (PPSV23) vaccine. One dose is recommended after age 32. Talk to your health care provider about which screenings and vaccines you need and how often you need them. This information is not intended to replace advice given to you by your health care provider. Make sure you discuss any questions you have with your health care provider. Document Released: 05/20/2015 Document Revised: 01/11/2016 Document Reviewed: 02/22/2015 Elsevier Interactive Patient Education  2017 [employer] Prevention in the Home Falls can cause injuries. They can happen to people of all ages. There are many things you can do to make your home safe and to help prevent falls. What can I do on the outside of my home? Regularly fix the edges of walkways and driveways and fix any cracks. Remove anything that might make you trip as you walk through a door, such as a raised step or threshold. Trim any bushes or trees on the path to your home. Use bright outdoor lighting. Clear any walking paths of anything that might make someone trip, such as rocks or tools. Regularly check to see if handrails are loose or broken. Make sure that both sides of any steps have handrails. Any raised decks and porches should have guardrails on the edges. Have any leaves, snow, or ice cleared regularly. Use sand or salt on walking paths during winter. Clean up any spills in your garage right away. This includes oil or grease spills. What can I do in the bathroom? Use night lights. Install grab bars by the toilet and in the tub and shower. Do not use towel bars as grab bars. Use non-skid mats or decals in the tub or shower. If you need to sit down in the shower, use a plastic, non-slip stool. Keep the floor dry. Clean  up any water that spills on the floor as soon as it happens. Remove soap buildup in the tub or shower regularly. Attach bath mats securely with double-sided non-slip rug tape. Do not have throw rugs and other things on the floor that can make you trip. What can I do in the bedroom? Use night lights. Make sure that you have a light by your bed that is easy to reach. Do not use any sheets or blankets that are too big for your bed. They should not hang down onto the floor. Have a firm chair that has side arms. You can use this for support while you get dressed. Do not have throw rugs and other things on the floor that can make you trip. What can I do in the kitchen? Clean up any spills right away. Avoid walking on wet floors. Keep items that you use a lot in easy-to-reach places. If you need to reach something above you, use a strong step stool that has a grab bar. Keep electrical cords out of the way. Do not use floor polish or wax that makes floors slippery. If you must use wax, use non-skid floor wax. Do not  have throw rugs and other things on the floor that can make you trip. What can I do with my stairs? Do not leave any items on the stairs. Make sure that there are handrails on both sides of the stairs and use them. Fix handrails that are broken or loose. Make sure that handrails are as long as the stairways. Check any carpeting to make sure that it is firmly attached to the stairs. Fix any carpet that is loose or worn. Avoid having throw rugs at the top or bottom of the stairs. If you do have throw rugs, attach them to the floor with carpet tape. Make sure that you have a light switch at the top of the stairs and the bottom of the stairs. If you do not have them, ask someone to add them for you. What else can I do to help prevent falls? Wear shoes that: Do not have high heels. Have rubber bottoms. Are comfortable and fit you well. Are closed at the toe. Do not wear sandals. If you  use a stepladder: Make sure that it is fully opened. Do not climb a closed stepladder. Make sure that both sides of the stepladder are locked into place. Ask someone to hold it for you, if possible. Clearly mark and make sure that you can see: Any grab bars or handrails. First and last steps. Where the edge of each step is. Use tools that help you move around (mobility aids) if they are needed. These include: Canes. Walkers. Scooters. Crutches. Turn on the lights when you go into a dark area. Replace any light bulbs as soon as they burn out. Set up your furniture so you have a clear path. Avoid moving your furniture around. If any of your floors are uneven, fix them. If there are any pets around you, be aware of where they are. Review your medicines with your doctor. Some medicines can make you feel dizzy. This can increase your chance of falling. Ask your doctor what other things that you can do to help prevent falls. This information is not intended to replace advice given to you by your health care provider. Make sure you discuss any questions you have with your health care provider. Document Released: 02/17/2009 Document Revised: 09/29/2015 Document Reviewed: 05/28/2014 Elsevier Interactive Patient Education  2017 [employer].

## 2021-05-26 NOTE — Progress Notes (Signed)
Subjective:   James [name] is a 66 y.o. male who presents for Medicare Annual/Subsequent preventive examination.  Review of Systems    No ROS Cardiac Risk Factors include: advanced age (>22men, >86 women);hypertension   Objective:    Today's Vitals   05/26/21 0901  Weight: (!) 312 lb (141.5 kg)  Height: 5\' 4"  (1.626 m)   Body mass index is 53.55 kg/m.  Advanced Directives 05/26/2021 05/31/2020 12/14/2019 02/11/2019 02/04/2019 07/18/2016  Does Patient Have a Medical Advance Directive? Yes Yes Yes Yes Yes Yes  Type of Advance Directive - [place];Living will [place];Living will [place];Living will [place];Living will Living will;Healthcare Power of Attorney  Does patient want to make changes to medical advance directive? No - Patient declined No - Patient declined - No - Guardian declined No - Guardian declined No - Patient declined  Copy of [place] in Chart? - No - copy requested No - copy requested No - copy requested No - copy requested No - copy requested    Current Medications (verified) Outpatient Encounter Medications as of 05/26/2021  Medication Sig   acetaminophen (TYLENOL) 500 MG tablet Take 1,000 mg by mouth at bedtime as needed for moderate pain.   amoxicillin (AMOXIL) 500 MG capsule Take 2,000 mg by mouth See admin instructions. Take 2000 mg 1 hour prior to dental work   cetirizine (ZYRTEC) 10 MG tablet Take 10 mg by mouth daily.   cholecalciferol (VITAMIN D3) 25 MCG (1000 UNIT) tablet Take 2,000 Units by mouth daily.   fluticasone (FLONASE) 50 MCG/ACT nasal spray Place 1 spray into both nostrils daily.    hydrochlorothiazide (HYDRODIURIL) 25 MG tablet Take 1 tablet (25 mg total) by mouth daily.   ibuprofen (ADVIL) 200 MG tablet Take 400 mg by mouth at bedtime as needed for moderate pain.   Ketotifen Fumarate (ALLERGY EYE DROPS OP) Place 1 drop into both eyes daily as  needed (allergies).   lidocaine (LMX) 4 % cream Apply 1 application topically daily as needed (pain).   lisinopril (ZESTRIL) 10 MG tablet Take 2 tablets (20 mg total) by mouth daily.   metFORMIN (GLUCOPHAGE) 500 MG tablet Take 1 tablet (500 mg total) by mouth daily. One tablet daily with lunch   Multiple Vitamin (MULTIVITAMIN) tablet Take 1 tablet by mouth daily.   [DISCONTINUED] DULoxetine (CYMBALTA) 30 MG capsule Take 30 mg by mouth daily. (Patient not taking: Reported on 02/27/2021)   [DISCONTINUED] DULoxetine (CYMBALTA) 60 MG capsule Take 60 mg by mouth daily.   No facility-administered encounter medications on file as of 05/26/2021.    Allergies (verified) Patient has no known allergies.   History: Past Medical History:  Diagnosis Date   Arthritis    Bilateral swelling of feet    Chicken pox    Complication of anesthesia    difficulty waking up   Depression    Hypertension    Joint pain    Obesity    Osteoarthritis    Prediabetes    Shortness of breath    Swelling    feet and legs   Vitamin D deficiency    Past Surgical History:  Procedure Laterality Date   cartilage removal     COLONOSCOPY WITH PROPOFOL N/A 12/14/2019   Procedure: COLONOSCOPY WITH PROPOFOL;  Surgeon: Doran Stabler, MD;  Location: [facility];  Service: Gastroenterology;  Laterality: N/A;   KNEE SURGERY  2000   POLYPECTOMY  12/14/2019  Procedure: POLYPECTOMY;  Surgeon: Doran Stabler, MD;  Location: [facility];  Service: Gastroenterology;;   TOTAL HIP ARTHROPLASTY Left 02/11/2019   Procedure: TOTAL HIP Cut Off;  Surgeon: Gaynelle Arabian, MD;  Location: [facility];  Service: Orthopedics;  Laterality: Left;  149min   Family History  Problem Relation Age of Onset   Cancer Mother        lung and soft tissue cancer from radiation   Stroke Mother    Obesity Mother    Heart disease Father        CHF   Diabetes Father    Hypertension Father    Obesity Father    Heart disease  Maternal Grandfather    Diabetes Paternal Grandmother    Ovarian cancer Sister    Colon cancer Neg Hx    Colon polyps Neg Hx    Esophageal cancer Neg Hx    Rectal cancer Neg Hx    Stomach cancer Neg Hx    Social History   Socioeconomic History   Marital status: Married    Spouse name: Not on file   Number of children: Not on file   Years of education: Not on file   Highest education level: Not on file  Occupational History   Occupation: At home caregiver  Tobacco Use   Smoking status: Former    Types: Cigarettes    Quit date: 07/16/1992    Years since quitting: 28.8   Smokeless tobacco: Never  Substance and Sexual Activity   Alcohol use: Yes    Comment: very little per patient    Drug use: No   Sexual activity: Not on file  Other Topics Concern   Not on file  Social History Narrative   Work or School: self employed, 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      Home Situation: living with and caring for wife whom had stroke and brain tumor      Spiritual Beliefs: Methodist      Lifestyle: no regular exercise, diet poor            Social Determinants of Health   Financial Resource Strain: Low Risk    Difficulty of Paying Living Expenses: Not hard at all  Food Insecurity: No Food Insecurity   Worried About Charity fundraiser in the Last Year: Never true   Arboriculturist in the Last Year: Never true  Transportation Needs: No Transportation Needs   Lack of Transportation (Medical): No   Lack of Transportation (Non-Medical): No  Physical Activity: Insufficiently Active   Days of Exercise per Week: 3 days   Minutes of Exercise per Session: 30 min  Stress: No Stress Concern Present   Feeling of Stress : Not at all  Social Connections: Moderately Integrated   Frequency of Communication with Friends and Family: Once a week   Frequency of Social Gatherings with Friends and Family: Never   Attends Religious Services: 1 to 4 times per year   Active Member of [employer] or  Organizations: Yes   Attends Archivist Meetings: 1 to 4 times per year   Marital Status: Married    Tobacco Counseling Counseling given: Not Answered   Clinical Intake:  Pre-visit preparation completed: Yes  Pain : No/denies pain     Diabetes: No  How often do you need to have someone help you when you read instructions, pamphlets, or other written materials from your doctor or pharmacy?: 1 - Never  Diabetic? No  Interpreter Needed?:  No Activities of Daily Living In your present state of health, do you have any difficulty performing the following activities: 05/26/2021 05/24/2021  Hearing? N N  Vision? N N  Difficulty concentrating or making decisions? N N  Walking or climbing stairs? Y Y  Comment Patient has knee arthritis -  Dressing or bathing? N N  Doing errands, shopping? N N  Preparing Food and eating ? N N  Using the Toilet? N N  In the past six months, have you accidently leaked urine? Y Y  Comment Patient wears pads and breifs -  Do you have problems with loss of bowel control? N N  Managing your Medications? N N  Managing your Finances? N N  Housekeeping or managing your Housekeeping? N Y  Some recent data might be hidden    Patient Care Team: Dorothyann Peng, NP as PCP - General (Family Medicine) Gaynelle Arabian, MD as Consulting Physician (Orthopedic Surgery)  Indicate any recent Medical Services you may have received from other than [facility] providers in the past year (date may be approximate).     Assessment:   This is a routine wellness examination for [place].. Virtual Visit via Telephone Note  I connected with  [name] on 05/26/21 at  9:00 AM EST by telephone and verified that I am speaking with the correct person using two identifiers.  Location: Patient: Home Provider: Office Persons participating in the virtual visit: patient/Nurse Health Advisor   I discussed the limitations, risks, security and privacy concerns of  performing an evaluation and management service by telephone and the availability of in person appointments. The patient expressed understanding and agreed to proceed.  Interactive audio and video telecommunications were attempted between this nurse and patient, however failed, due to patient having technical difficulties OR patient did not have access to video capability.  We continued and completed visit with audio only.  Some vital signs may be absent or patient reported.   Criselda Peaches, LPN   Hearing/Vision screen Hearing Screening - Comments:: No difficulty hearing Vision Screening - Comments:: Wears glasses. Followed by [facility]  Dietary issues and exercise activities discussed: Current Exercise Habits: Home exercise routine, Type of exercise: walking, Time (Minutes): 30, Frequency (Times/Week): 3, Weekly Exercise (Minutes/Week): 90, Intensity: Mild, Exercise limited by: None identified   Goals Addressed             This Visit's Progress    Exercise 150 min/wk Moderate Activity       Patient wants to continue to exercise.(Walking 3 days per week X 30 min.)       Depression Screen PHQ 2/9 Scores 05/26/2021 05/31/2020 02/08/2020 09/04/2016 07/17/2016  PHQ - 2 Score 0 3 5 5 6   PHQ- 9 Score - 13 19 14 23     Fall Risk Fall Risk  05/26/2021 05/24/2021 05/31/2020 04/01/2019  Falls in the past year? 1 1 0 0  Comment - - - Emmi Telephone Survey: data to providers prior to load  Number falls in past yr: 1 1 0 -  Injury with Fall? 0 0 0 -  Comment Fall w/o injury or medical attention needed - - -  Risk for fall due to : - - Impaired balance/gait -  Follow up - - Falls evaluation completed;Falls prevention discussed -    FALL RISK PREVENTION PERTAINING TO THE HOME:  Any stairs in or around the home? Yes  If so, are there any without handrails? No  Home free of loose throw  rugs in walkways, pet beds, electrical cords, etc? Yes  Adequate lighting in your home to reduce  risk of falls? Yes   ASSISTIVE DEVICES UTILIZED TO PREVENT FALLS:  Life alert? No  Use of a cane, walker or w/c? Yes  Grab bars in the bathroom? Yes  Shower chair or bench in shower? No  Elevated toilet seat or a handicapped toilet? No   TIMED UP AND GO:  Was the test performed? No . Visit  Cognitive Function:    Immunizations Immunization History  Administered Date(s) Administered   Fluad Quad(high Dose 65+) 01/13/2019, 01/19/2020, 02/23/2021   Influenza Inj Mdck Quad Pf 01/27/2018   Influenza,inj,Quad PF,6+ Mos 01/15/2013, 05/17/2015, 01/10/2016   Influenza,inj,quad, With Preservative 01/27/2018   Influenza-Unspecified 05/13/2014, 02/24/2017   PFIZER Comirnaty(Gray Top)Covid-19 Tri-Sucrose Vaccine 08/30/2020   PFIZER(Purple Top)SARS-COV-2 Vaccination 06/12/2019, 07/08/2019, 02/16/2020, 08/30/2020   Pneumococcal Conjugate-13 01/13/2019   Pneumococcal Polysaccharide-23 01/19/2020   Td 05/07/2005   Tdap 05/17/2015   Covid-19 vaccine status: Completed vaccines 08/30/20  Qualifies for Shingles Vaccine? Yes   Zostavax completed No   Shingrix Completed?: No.    Education has been provided regarding the importance of this vaccine. Patient has been advised to call insurance company to determine out of pocket expense if they have not yet received this vaccine. Advised may also receive vaccine at local pharmacy or Health Dept. Verbalized acceptance and understanding.  Screening Tests Health Maintenance  Topic Date Due   COVID-19 Vaccine (5 - Booster for Pfizer series) 10/25/2020   Zoster Vaccines- Shingrix (1 of 2) 08/24/2021 (Originally 12/09/2003)   COLON CANCER SCREENING ANNUAL FOBT  11/23/2021 (Originally 12/13/2020)   Hepatitis C Screening  09/12/2025 (Originally 12/09/1973)   Fecal DNA (Cologuard)  10/15/2022   TETANUS/TDAP  05/16/2025   Pneumonia Vaccine 22+ Years old  Completed   INFLUENZA VACCINE  Completed   HPV VACCINES  Aged Out    Health Maintenance  Health  Maintenance Due  Topic Date Due   COVID-19 Vaccine (5 - Booster for [place] series) 10/25/2020    Colon Cancer Screening: Patient deferred 11/23/21   Additional Screening:   Vision Screening: Recommended annual ophthalmology exams for early detection of glaucoma and other disorders of the eye. Is the patient up to date with their annual eye exam?  Yes  Who is the provider or what is the name of the office in which the patient attends annual eye exams? Followed by [facility].    Dental Screening: Recommended annual dental exams for proper oral hygiene  Community Resource Referral / Chronic Care Management:  CRR required this visit?  No   CCM required this visit?  No      Plan:     I have personally reviewed and noted the following in the patients chart:   Medical and social history Use of alcohol, tobacco or illicit drugs  Current medications and supplements including opioid prescriptions. Patient is not currently taking opioid prescriptions. Functional ability and status Nutritional status Physical activity Advanced directives List of other physicians Hospitalizations, surgeries, and ER visits in previous 12 months Vitals Screenings to include cognitive, depression, and falls Referrals and appointments  In addition, I have reviewed and discussed with patient certain preventive protocols, quality metrics, and best practice recommendations. A written personalized care plan for preventive services as well as general preventive health recommendations were provided to patient.     Criselda Peaches, LPN   1/61/0960

## 2021-06-01 DIAGNOSIS — G4733 Obstructive sleep apnea (adult) (pediatric): Secondary | ICD-10-CM | POA: Diagnosis not present

## 2021-06-14 DIAGNOSIS — G4733 Obstructive sleep apnea (adult) (pediatric): Secondary | ICD-10-CM | POA: Diagnosis not present

## 2021-07-02 DIAGNOSIS — G4733 Obstructive sleep apnea (adult) (pediatric): Secondary | ICD-10-CM | POA: Diagnosis not present

## 2021-07-18 DIAGNOSIS — H16223 Keratoconjunctivitis sicca, not specified as Sjogren's, bilateral: Secondary | ICD-10-CM | POA: Diagnosis not present

## 2021-07-30 DIAGNOSIS — G4733 Obstructive sleep apnea (adult) (pediatric): Secondary | ICD-10-CM | POA: Diagnosis not present

## 2021-08-02 ENCOUNTER — Encounter: Payer: Self-pay | Admitting: Pulmonary Disease

## 2021-08-02 ENCOUNTER — Other Ambulatory Visit: Payer: Self-pay

## 2021-08-02 ENCOUNTER — Ambulatory Visit: Payer: PPO | Admitting: Pulmonary Disease

## 2021-08-02 VITALS — BP 118/76 | HR 67 | Ht 64.0 in | Wt 334.0 lb

## 2021-08-02 DIAGNOSIS — G4733 Obstructive sleep apnea (adult) (pediatric): Secondary | ICD-10-CM | POA: Diagnosis not present

## 2021-08-02 DIAGNOSIS — Z9989 Dependence on other enabling machines and devices: Secondary | ICD-10-CM

## 2021-08-02 NOTE — Progress Notes (Signed)
? ?      ?WINDSOR GOEKEN    [id number]    1955/06/12 ? ?Primary Care Physician:Nafziger, Tommi Rumps, NP ? ?Referring Physician: Dorothyann Peng, NP ?[address] ?[place],  [place] [address] ? ?Chief complaint:   ?In for follow-up for severe obstructive sleep apnea ? ?HPI: ? ?Continues to use CPAP almost nightly and benefiting from it ? ?Wakes up feeling like is at a good nights rest ? ?Energy levels better ? ?Weight is up a little bit compared to last visit ? ?No significant issues with CPAP tolerance ?Has some difficulty with the headgear but otherwise feels well ?No significant issues with the mask ? ?Patient is getting about 7 hours of sleep every night ? ?Usually goes to bed between 1030 and 11 ?Falls asleep in 10 to 30 minutes ?2-5 awakenings ?Final wake up time between 730 and 830 ? ?Continues to work on weight loss efforts ? ?Both parents snored ? ?Reformed smoker-quit over 25 years ago ? ?Memory is good, able to drive safely, no morning headaches ? ?Outpatient Encounter Medications as of 08/02/2021  ?Medication Sig  ? acetaminophen (TYLENOL) 500 MG tablet Take 1,000 mg by mouth at bedtime as needed for moderate pain.  ? amoxicillin (AMOXIL) 500 MG capsule Take 2,000 mg by mouth See admin instructions. Take 2000 mg 1 hour prior to dental work  ? cetirizine (ZYRTEC) 10 MG tablet Take 10 mg by mouth daily.  ? cholecalciferol (VITAMIN D3) 25 MCG (1000 UNIT) tablet Take 2,000 Units by mouth daily.  ? fluticasone (FLONASE) 50 MCG/ACT nasal spray Place 1 spray into both nostrils daily.   ? hydrochlorothiazide (HYDRODIURIL) 25 MG tablet Take 1 tablet (25 mg total) by mouth daily.  ? ibuprofen (ADVIL) 200 MG tablet Take 400 mg by mouth at bedtime as needed for moderate pain.  ? Ketotifen Fumarate (ALLERGY EYE DROPS OP) Place 1 drop into both eyes daily as needed (allergies).  ? lidocaine (LMX) 4 % cream Apply 1 application topically daily as needed (pain).  ? lisinopril (ZESTRIL) 10 MG tablet Take 2 tablets (20  mg total) by mouth daily.  ? metFORMIN (GLUCOPHAGE) 500 MG tablet Take 1 tablet (500 mg total) by mouth daily. One tablet daily with lunch  ? Multiple Vitamin (MULTIVITAMIN) tablet Take 1 tablet by mouth daily.  ? ?No facility-administered encounter medications on file as of 08/02/2021.  ? ? ?Allergies as of 08/02/2021  ? (No Known Allergies)  ? ? ?Past Medical History:  ?Diagnosis Date  ? Arthritis   ? Bilateral swelling of feet   ? Chicken pox   ? Complication of anesthesia   ? difficulty waking up  ? Depression   ? Hypertension   ? Joint pain   ? Obesity   ? Osteoarthritis   ? Prediabetes   ? Shortness of breath   ? Swelling   ? feet and legs  ? Vitamin D deficiency   ? ? ?Past Surgical History:  ?Procedure Laterality Date  ? cartilage removal    ? COLONOSCOPY WITH PROPOFOL N/A 12/14/2019  ? Procedure: COLONOSCOPY WITH PROPOFOL;  Surgeon: Doran Stabler, MD;  Location: [facility];  Service: Gastroenterology;  Laterality: N/A;  ? KNEE SURGERY  2000  ? POLYPECTOMY  12/14/2019  ? Procedure: POLYPECTOMY;  Surgeon: Doran Stabler, MD;  Location: [facility];  Service: Gastroenterology;;  ? TOTAL HIP ARTHROPLASTY Left 02/11/2019  ? Procedure: TOTAL HIP ARTHROPLASTY-POSTERIOR;  Surgeon: Gaynelle Arabian, MD;  Location: [facility];  Service:  Orthopedics;  Laterality: Left;  168mn  ? ? ?Family History  ?Problem Relation Age of Onset  ? Cancer Mother   ?     lung and soft tissue cancer from radiation  ? Stroke Mother   ? Obesity Mother   ? Heart disease Father   ?     CHF  ? Diabetes Father   ? Hypertension Father   ? Obesity Father   ? Heart disease Maternal Grandfather   ? Diabetes Paternal Grandmother   ? Ovarian cancer Sister   ? Colon cancer Neg Hx   ? Colon polyps Neg Hx   ? Esophageal cancer Neg Hx   ? Rectal cancer Neg Hx   ? Stomach cancer Neg Hx   ? ? ?Social History  ? ?Socioeconomic History  ? Marital status: Married  ?  Spouse name: Not on file  ? Number of children: Not on file  ? Years of education: Not on  file  ? Highest education level: Not on file  ?Occupational History  ? Occupation: At home caregiver  ?Tobacco Use  ? Smoking status: Former  ?  Types: Cigarettes  ?  Quit date: 07/16/1992  ?  Years since quitting: 29.0  ? Smokeless tobacco: Never  ?Substance and Sexual Activity  ? Alcohol use: Yes  ?  Comment: very little per patient   ? Drug use: No  ? Sexual activity: Not on file  ?Other Topics Concern  ? Not on file  ?Social History Narrative  ? Work or School: self employed, lRisk manager- legal insurance  ?   ? Home Situation: living with and caring for wife whom had stroke and brain tumor  ?   ? Spiritual Beliefs: Methodist  ?   ? Lifestyle: no regular exercise, diet poor  ?   ?   ?   ? ?Social Determinants of Health  ? ?Financial Resource Strain: Low Risk   ? Difficulty of Paying Living Expenses: Not hard at all  ?Food Insecurity: No Food Insecurity  ? Worried About RCharity fundraiserin the Last Year: Never true  ? Ran Out of Food in the Last Year: Never true  ?Transportation Needs: No Transportation Needs  ? Lack of Transportation (Medical): No  ? Lack of Transportation (Non-Medical): No  ?Physical Activity: Insufficiently Active  ? Days of Exercise per Week: 3 days  ? Minutes of Exercise per Session: 30 min  ?Stress: No Stress Concern Present  ? Feeling of Stress : Not at all  ?Social Connections: Moderately Integrated  ? Frequency of Communication with Friends and Family: Once a week  ? Frequency of Social Gatherings with Friends and Family: Never  ? Attends Religious Services: 1 to 4 times per year  ? Active Member of Clubs or Organizations: Yes  ? Attends CArchivistMeetings: 1 to 4 times per year  ? Marital Status: Married  ?Intimate Partner Violence: Not At Risk  ? Fear of Current or Ex-Partner: No  ? Emotionally Abused: No  ? Physically Abused: No  ? Sexually Abused: No  ? ? ?Review of Systems  ?Respiratory:  Positive for apnea.   ?Psychiatric/Behavioral:  Positive for sleep disturbance.    ? ?There were no vitals filed for this visit. ? ? ? ?Physical Exam ?Constitutional:   ?   Appearance: He is obese.  ?HENT:  ?   Head: Normocephalic.  ?   Nose: Nose normal. No congestion.  ?   Mouth/Throat:  ?  Mouth: Mucous membranes are moist.  ?   Comments: Mallampati 4, crowded oropharynx, macroglossia ?Eyes:  ?   General:     ?   Right eye: No discharge.     ?   Left eye: No discharge.  ?   Pupils: Pupils are equal, round, and reactive to light.  ?Cardiovascular:  ?   Rate and Rhythm: Normal rate and regular rhythm.  ?   Heart sounds: No murmur heard. ?  No friction rub.  ?Pulmonary:  ?   Effort: No respiratory distress.  ?   Breath sounds: No stridor. No wheezing or rhonchi.  ?Musculoskeletal:  ?   Cervical back: No rigidity or tenderness.  ?Neurological:  ?   General: No focal deficit present.  ?   Mental Status: He is alert.  ?Psychiatric:     ?   Mood and Affect: Mood normal.  ? ?Compliance data shows 77% compliance ?Average use of 6 hours 43 minutes ?Machine set between 5 and 20 ?95 percentile pressure of 17.0 ?Residual AHI of 3.7 ? ?Assessment:  ?Marland Kitchen  Severe obstructive sleep apnea on CPAP therapy ?-Continues to tolerate CPAP well ?-Sleeping much better ?-Energy levels continue to improve ? ?Continues to benefit from CPAP use on a nightly basis ? ?Obesity ?-Encouraged to continue weight loss efforts ?-Diet and regular exercise ? ? ?Plan/Recommendations: ?Continue CPAP on a nightly basis ? ?Follow-up in about a year ? ?Encouraged to give [name] a call if he has any significant concerns ? ?Importance of keeping his weight in check was reiterated ? ? ? ?Sherrilyn Rist MD ?[facility] ?08/02/2021, 9:14 AM ? ?CC: Dorothyann Peng, NP ? ? ?

## 2021-08-02 NOTE — Patient Instructions (Signed)
Obstructive sleep apnea- ?-Well treated with CPAP at present ? ?Weight loss efforts as tolerated ? ?I will see you a year from now ? ?Call with significant concerns ?

## 2021-08-30 DIAGNOSIS — G4733 Obstructive sleep apnea (adult) (pediatric): Secondary | ICD-10-CM | POA: Diagnosis not present

## 2021-09-08 ENCOUNTER — Ambulatory Visit (INDEPENDENT_AMBULATORY_CARE_PROVIDER_SITE_OTHER): Payer: PPO

## 2021-09-08 ENCOUNTER — Ambulatory Visit (INDEPENDENT_AMBULATORY_CARE_PROVIDER_SITE_OTHER): Payer: PPO | Admitting: Adult Health

## 2021-09-08 ENCOUNTER — Encounter: Payer: Self-pay | Admitting: Adult Health

## 2021-09-08 VITALS — BP 138/72 | HR 67 | Temp 98.2°F | Ht 64.0 in | Wt 340.0 lb

## 2021-09-08 DIAGNOSIS — R0609 Other forms of dyspnea: Secondary | ICD-10-CM

## 2021-09-08 DIAGNOSIS — R600 Localized edema: Secondary | ICD-10-CM | POA: Diagnosis not present

## 2021-09-08 DIAGNOSIS — R0602 Shortness of breath: Secondary | ICD-10-CM | POA: Diagnosis not present

## 2021-09-08 DIAGNOSIS — J9811 Atelectasis: Secondary | ICD-10-CM | POA: Diagnosis not present

## 2021-09-08 LAB — BASIC METABOLIC PANEL
BUN: 17 mg/dL (ref 6–23)
CO2: 30 mEq/L (ref 19–32)
Calcium: 9 mg/dL (ref 8.4–10.5)
Chloride: 103 mEq/L (ref 96–112)
Creatinine, Ser: 1.06 mg/dL (ref 0.40–1.50)
GFR: 72.5 mL/min (ref >60.00)
Glucose, Bld: 97 mg/dL (ref 70–99)
Potassium: 4.1 mEq/L (ref 3.5–5.1)
Sodium: 141 mEq/L (ref 135–145)

## 2021-09-08 LAB — BRAIN NATRIURETIC PEPTIDE: Pro B Natriuretic peptide (BNP): 8 pg/mL (ref 0.0–100.0)

## 2021-09-08 MED ORDER — FUROSEMIDE 20 MG PO TABS: 20.0000 mg | ORAL_TABLET | Freq: Every day | ORAL | 0 refills | Status: DC

## 2021-09-08 NOTE — Progress Notes (Signed)
? ?Subjective:  ? ? Patient ID: James [name], male    DOB: [birth year], 66 y.o.   MRN: [id number] ? ?HPI ?66 year old male who  has a past medical history of Arthritis, Bilateral swelling of feet, Chicken pox, Complication of anesthesia, Depression, Hypertension, Joint pain, Obesity, Osteoarthritis, Prediabetes, Shortness of breath, Swelling, and Vitamin D deficiency. ? ?He presents to the office today for an acute issue of shortness of breath with minimal exertion and lower extremity edema bilaterally. ? ?He reports that over the last few months he has had worsening shortness of breath with minimal exertion such as walking down the hallway in his home.  He also reports worsening degrees of lower extremity edema.  He is taking his HCTZ daily and elevating his legs while at rest.  He has been more sedentary than he has in the last year and his weight is up. ? ?He denies chest pain or shortness of breath with rest. ? ?Wt Readings from Last 10 Encounters:  ?09/08/21 (!) 340 lb (154.2 kg)  ?08/02/21 (!) 334 lb (151.5 kg)  ?05/26/21 (!) 312 lb (141.5 kg)  ?03/20/21 (!) 312 lb (141.5 kg)  ?02/27/21 (!) 312 lb (141.5 kg)  ?02/23/21 (!) 316 lb (143.3 kg)  ?02/02/21 (!) 312 lb (141.5 kg)  ?01/17/21 (!) 319 lb 9.6 oz (145 kg)  ?01/12/21 (!) 315 lb (142.9 kg)  ?12/20/20 (!) 316 lb (143.3 kg)  ? ?Review of Systems ?See HPI  ? ?Past Medical History:  ?Diagnosis Date  ? Arthritis   ? Bilateral swelling of feet   ? Chicken pox   ? Complication of anesthesia   ? difficulty waking up  ? Depression   ? Hypertension   ? Joint pain   ? Obesity   ? Osteoarthritis   ? Prediabetes   ? Shortness of breath   ? Swelling   ? feet and legs  ? Vitamin D deficiency   ? ? ?Social History  ? ?Socioeconomic History  ? Marital status: Married  ?  Spouse name: Not on file  ? Number of children: Not on file  ? Years of education: Not on file  ? Highest education level: Not on file  ?Occupational History  ? Occupation: At home caregiver  ?Tobacco Use   ? Smoking status: Former  ?  Types: Cigarettes  ?  Quit date: 07/16/1992  ?  Years since quitting: 29.1  ? Smokeless tobacco: Never  ?Substance and Sexual Activity  ? Alcohol use: Yes  ?  Comment: very little per patient   ? Drug use: No  ? Sexual activity: Not on file  ?Other Topics Concern  ? Not on file  ?Social History Narrative  ? 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   ? Home Situation: living with and caring for wife whom had stroke and brain tumor  ?   ? Spiritual Beliefs: Methodist  ?   ? Lifestyle: no regular exercise, diet poor  ?   ?   ?   ? ?Social Determinants of Health  ? ?Financial Resource Strain: Low Risk   ? Difficulty of Paying Living Expenses: Not very hard  ?Food Insecurity: No Food Insecurity  ? Worried About Charity fundraiser in the Last Year: Never true  ? Ran Out of Food in the Last Year: Never true  ?Transportation Needs: No Transportation Needs  ? Lack of Transportation (Medical): No  ? Lack of Transportation (Non-Medical): No  ?Physical  Activity: Inactive  ? Days of Exercise per Week: 0 days  ? Minutes of Exercise per Session: 30 min  ?Stress: No Stress Concern Present  ? Feeling of Stress : Not at all  ?Social Connections: Moderately Isolated  ? Frequency of Communication with Friends and Family: Twice a week  ? Frequency of Social Gatherings with Friends and Family: Never  ? Attends Religious Services: Never  ? Active Member of Clubs or Organizations: No  ? Attends Archivist Meetings: 1 to 4 times per year  ? Marital Status: Married  ?Intimate Partner Violence: Not At Risk  ? Fear of Current or Ex-Partner: No  ? Emotionally Abused: No  ? Physically Abused: No  ? Sexually Abused: No  ? ? ?Past Surgical History:  ?Procedure Laterality Date  ? cartilage removal    ? COLONOSCOPY WITH PROPOFOL N/A 12/14/2019  ? Procedure: COLONOSCOPY WITH PROPOFOL;  Surgeon: Doran Stabler, MD;  Location: [facility];  Service: Gastroenterology;  Laterality: N/A;   ? KNEE SURGERY  2000  ? POLYPECTOMY  12/14/2019  ? Procedure: POLYPECTOMY;  Surgeon: Doran Stabler, MD;  Location: [facility];  Service: Gastroenterology;;  ? TOTAL HIP ARTHROPLASTY Left 02/11/2019  ? Procedure: TOTAL HIP ARTHROPLASTY-POSTERIOR;  Surgeon: Gaynelle Arabian, MD;  Location: [facility];  Service: Orthopedics;  Laterality: Left;  142mn  ? ? ?Family History  ?Problem Relation Age of Onset  ? Cancer Mother   ?     lung and soft tissue cancer from radiation  ? Stroke Mother   ? Obesity Mother   ? Heart disease Father   ?     CHF  ? Diabetes Father   ? Hypertension Father   ? Obesity Father   ? Heart disease Maternal Grandfather   ? Diabetes Paternal Grandmother   ? Ovarian cancer Sister   ? Colon cancer Neg Hx   ? Colon polyps Neg Hx   ? Esophageal cancer Neg Hx   ? Rectal cancer Neg Hx   ? Stomach cancer Neg Hx   ? ? ?No Known Allergies ? ?Current Outpatient Medications on File Prior to Visit  ?Medication Sig Dispense Refill  ? acetaminophen (TYLENOL) 500 MG tablet Take 1,000 mg by mouth at bedtime as needed for moderate pain.    ? amoxicillin (AMOXIL) 500 MG capsule Take 2,000 mg by mouth See admin instructions. Take 2000 mg 1 hour prior to dental work    ? cetirizine (ZYRTEC) 10 MG tablet Take 10 mg by mouth daily.    ? cholecalciferol (VITAMIN D3) 25 MCG (1000 UNIT) tablet Take 2,000 Units by mouth daily.    ? fluticasone (FLONASE) 50 MCG/ACT nasal spray Place 1 spray into both nostrils daily.     ? hydrochlorothiazide (HYDRODIURIL) 25 MG tablet Take 1 tablet (25 mg total) by mouth daily. 90 tablet 0  ? ibuprofen (ADVIL) 200 MG tablet Take 400 mg by mouth at bedtime as needed for moderate pain.    ? Ketotifen Fumarate (ALLERGY EYE DROPS OP) Place 1 drop into both eyes daily as needed (allergies).    ? lidocaine (LMX) 4 % cream Apply 1 application topically daily as needed (pain).    ? lidocaine (LMX) 4 % cream Apply topically.    ? lisinopril (ZESTRIL) 10 MG tablet Take 2 tablets (20 mg total) by mouth  daily. 180 tablet 0  ? Multiple Vitamin (MULTIVITAMIN) tablet Take 1 tablet by mouth daily.    ? ?No current facility-administered medications on  file prior to visit.  ? ? ?BP 138/72   Pulse 67   Temp 98.2 ?F (36.8 ?C) (Oral)   Ht '5\' 4"'$  (1.626 m)   Wt (!) 340 lb (154.2 kg)   SpO2 95%   BMI 58.36 kg/m?  ? ? ?   ?Objective:  ? Physical Exam ?Vitals and nursing note reviewed.  ?Constitutional:   ?   Appearance: Normal appearance. He is obese.  ?Cardiovascular:  ?   Rate and Rhythm: Normal rate and regular rhythm.  ?   Pulses: Normal pulses.  ?   Heart sounds: Normal heart sounds.  ?   Comments: + 2 non pitting edema noted bilaterally.  ?No signs of DVT  ? ?Pulmonary:  ?   Effort: Pulmonary effort is normal.  ?   Breath sounds: Normal breath sounds.  ?Musculoskeletal:     ?   General: Normal range of motion.  ?   Right lower leg: 2+ Edema present.  ?   Left lower leg: 2+ Edema present.  ?Skin: ?   General: Skin is warm and dry.  ?Neurological:  ?   General: No focal deficit present.  ?   Mental Status: He is alert and oriented to person, place, and time.  ?Psychiatric:     ?   Mood and Affect: Mood normal.     ?   Behavior: Behavior normal.     ?   Thought Content: Thought content normal.     ?   Judgment: Judgment normal.  ? ?   ?Assessment & Plan:  ?1. DOE (dyspnea on exertion) ?-We will check chest x-ray, BNP, BMP.  There is some concern for CHF.  Also may be from sedentary lifestyle and weight gain.  We will do 5 days of Lasix to see if we can get some extra fluid off of him.  We will likely be referred to cardiology and will likely need an echo. ?- Basic Metabolic Panel; Future ?- Brain Natriuretic Peptide; Future ?- furosemide (LASIX) 20 MG tablet; Take 1 tablet (20 mg total) by mouth daily.  Dispense: 5 tablet; Refill: 0 ?- DG Chest 2 View; Future ?- Basic Metabolic Panel ?- Brain Natriuretic Peptide ? ?2. Lower extremity edema ? ?- Basic Metabolic Panel; Future ?- Brain Natriuretic Peptide; Future ?-  furosemide (LASIX) 20 MG tablet; Take 1 tablet (20 mg total) by mouth daily.  Dispense: 5 tablet; Refill: 0 ?- DG Chest 2 View; Future ?- Basic Metabolic Panel ?- Brain Natriuretic Peptide ? ?Dorothyann Peng, NP ?

## 2021-09-08 NOTE — Patient Instructions (Signed)
There are no preventive care reminders to display for this patient. ? ? ? Row Labels 05/26/2021  ?  9:11 AM 05/31/2020  ?  8:57 AM 02/08/2020  ?  8:33 AM  ?Depression screen PHQ 2/9   Section Header. No data exists in this row.     ?Decreased Interest   0 2 3  ?Down, Depressed, Hopeless   0 1 2  ?PHQ - 2 Score   0 3 5  ?Altered sleeping    2 3  ?Tired, decreased energy    2 3  ?Change in appetite    3 3  ?Feeling bad or failure about yourself     1 1  ?Trouble concentrating    0 1  ?Moving slowly or fidgety/restless    2 2  ?Suicidal thoughts    0 1  ?PHQ-9 Score    13 19  ?Difficult doing work/chores    Somewhat difficult Very difficult  ? ? ?

## 2021-09-11 ENCOUNTER — Encounter: Payer: Self-pay | Admitting: Adult Health

## 2021-09-29 DIAGNOSIS — G4733 Obstructive sleep apnea (adult) (pediatric): Secondary | ICD-10-CM | POA: Diagnosis not present

## 2021-10-28 ENCOUNTER — Other Ambulatory Visit: Payer: Self-pay | Admitting: Adult Health

## 2021-10-28 ENCOUNTER — Other Ambulatory Visit: Payer: Self-pay | Admitting: Family Medicine

## 2021-10-28 DIAGNOSIS — I10 Essential (primary) hypertension: Secondary | ICD-10-CM

## 2021-10-30 DIAGNOSIS — G4733 Obstructive sleep apnea (adult) (pediatric): Secondary | ICD-10-CM | POA: Diagnosis not present

## 2021-11-01 ENCOUNTER — Encounter: Payer: Self-pay | Admitting: Adult Health

## 2021-11-01 ENCOUNTER — Other Ambulatory Visit: Payer: Self-pay | Admitting: Family Medicine

## 2021-11-01 MED ORDER — HYDROCHLOROTHIAZIDE 25 MG PO TABS: 25.0000 mg | ORAL_TABLET | Freq: Every day | ORAL | 0 refills | Status: DC

## 2021-11-29 DIAGNOSIS — G4733 Obstructive sleep apnea (adult) (pediatric): Secondary | ICD-10-CM | POA: Diagnosis not present

## 2021-12-13 ENCOUNTER — Encounter (INDEPENDENT_AMBULATORY_CARE_PROVIDER_SITE_OTHER): Payer: Self-pay

## 2022-01-26 ENCOUNTER — Other Ambulatory Visit: Payer: Self-pay | Admitting: Adult Health

## 2022-01-26 DIAGNOSIS — I10 Essential (primary) hypertension: Secondary | ICD-10-CM

## 2022-01-27 ENCOUNTER — Encounter: Payer: Self-pay | Admitting: Pulmonary Disease

## 2022-01-30 DIAGNOSIS — G4733 Obstructive sleep apnea (adult) (pediatric): Secondary | ICD-10-CM | POA: Diagnosis not present

## 2022-02-22 ENCOUNTER — Encounter: Payer: Self-pay | Admitting: Adult Health

## 2022-02-22 NOTE — Telephone Encounter (Signed)
Pt has been scheduled for tomorrow. Advised pt that if sx worsen please got to local UC or ED.

## 2022-02-23 ENCOUNTER — Ambulatory Visit (INDEPENDENT_AMBULATORY_CARE_PROVIDER_SITE_OTHER): Payer: PPO | Admitting: Adult Health

## 2022-02-23 ENCOUNTER — Encounter: Payer: Self-pay | Admitting: Adult Health

## 2022-02-23 VITALS — BP 120/62 | HR 72 | Temp 98.3°F | Ht 64.0 in | Wt 357.0 lb

## 2022-02-23 DIAGNOSIS — I10 Essential (primary) hypertension: Secondary | ICD-10-CM | POA: Diagnosis not present

## 2022-02-23 DIAGNOSIS — Z0000 Encounter for general adult medical examination without abnormal findings: Secondary | ICD-10-CM

## 2022-02-23 DIAGNOSIS — Z8249 Family history of ischemic heart disease and other diseases of the circulatory system: Secondary | ICD-10-CM | POA: Diagnosis not present

## 2022-02-23 DIAGNOSIS — M5412 Radiculopathy, cervical region: Secondary | ICD-10-CM

## 2022-02-23 DIAGNOSIS — G4733 Obstructive sleep apnea (adult) (pediatric): Secondary | ICD-10-CM | POA: Diagnosis not present

## 2022-02-23 DIAGNOSIS — H6993 Unspecified Eustachian tube disorder, bilateral: Secondary | ICD-10-CM

## 2022-02-23 DIAGNOSIS — Z125 Encounter for screening for malignant neoplasm of prostate: Secondary | ICD-10-CM

## 2022-02-23 DIAGNOSIS — E7849 Other hyperlipidemia: Secondary | ICD-10-CM | POA: Diagnosis not present

## 2022-02-23 DIAGNOSIS — Z6841 Body Mass Index (BMI) 40.0 and over, adult: Secondary | ICD-10-CM

## 2022-02-23 DIAGNOSIS — R7303 Prediabetes: Secondary | ICD-10-CM | POA: Diagnosis not present

## 2022-02-23 DIAGNOSIS — R42 Dizziness and giddiness: Secondary | ICD-10-CM

## 2022-02-23 LAB — COMPREHENSIVE METABOLIC PANEL
ALT: 25 U/L (ref 0–53)
AST: 22 U/L (ref 0–37)
Albumin: 4.1 g/dL (ref 3.5–5.2)
Alkaline Phosphatase: 49 U/L (ref 39–117)
BUN: 14 mg/dL (ref 6–23)
CO2: 28 mEq/L (ref 19–32)
Calcium: 9.3 mg/dL (ref 8.4–10.5)
Chloride: 98 mEq/L (ref 96–112)
Creatinine, Ser: 0.99 mg/dL (ref 0.40–1.50)
GFR: 78.44 mL/min (ref >60.00)
Glucose, Bld: 93 mg/dL (ref 70–99)
Potassium: 3.8 mEq/L (ref 3.5–5.1)
Sodium: 136 mEq/L (ref 135–145)
Total Bilirubin: 0.4 mg/dL (ref 0.2–1.2)
Total Protein: 7.5 g/dL (ref 6.0–8.3)

## 2022-02-23 LAB — CBC WITH DIFFERENTIAL/PLATELET
Basophils Absolute: 0 10*3/uL (ref 0.0–0.1)
Basophils Relative: 0.4 % (ref 0.0–3.0)
Eosinophils Absolute: 0 10*3/uL (ref 0.0–0.7)
Eosinophils Relative: 0.6 % (ref 0.0–5.0)
HCT: 42.7 % (ref 39.0–52.0)
Hemoglobin: 14.2 g/dL (ref 13.0–17.0)
Lymphocytes Relative: 23 % (ref 12.0–46.0)
Lymphs Abs: 1.8 10*3/uL (ref 0.7–4.0)
MCHC: 33.1 g/dL (ref 30.0–36.0)
MCV: 88.8 fl (ref 78.0–100.0)
Monocytes Absolute: 0.6 10*3/uL (ref 0.1–1.0)
Monocytes Relative: 8.2 % (ref 3.0–12.0)
Neutro Abs: 5.3 10*3/uL (ref 1.4–7.7)
Neutrophils Relative %: 67.8 % (ref 43.0–77.0)
Platelets: 207 10*3/uL (ref 150.0–400.0)
RBC: 4.81 Mil/uL (ref 4.22–5.81)
RDW: 14.7 % (ref 11.5–15.5)
WBC: 7.8 10*3/uL (ref 4.0–10.5)

## 2022-02-23 LAB — LIPID PANEL
Cholesterol: 177 mg/dL (ref 0–200)
HDL: 50.6 mg/dL (ref >39.00)
LDL Cholesterol: 93 mg/dL (ref 0–99)
NonHDL: 126.11
Total CHOL/HDL Ratio: 3
Triglycerides: 166 mg/dL — ABNORMAL HIGH (ref 0.0–149.0)
VLDL: 33.2 mg/dL (ref 0.0–40.0)

## 2022-02-23 LAB — TSH: TSH: 1.93 u[IU]/mL (ref 0.35–5.50)

## 2022-02-23 LAB — PSA: PSA: 0.96 ng/mL (ref 0.10–4.00)

## 2022-02-23 LAB — HEMOGLOBIN A1C: Hgb A1c MFr Bld: 6.1 % (ref 4.6–6.5)

## 2022-02-23 MED ORDER — MECLIZINE HCL 25 MG PO TABS: 25.0000 mg | ORAL_TABLET | Freq: Three times a day (TID) | ORAL | 0 refills | Status: DC | PRN

## 2022-02-23 MED ORDER — METHYLPREDNISOLONE 4 MG PO TBPK: ORAL_TABLET | ORAL | 0 refills | Status: DC

## 2022-02-23 MED ORDER — METHOCARBAMOL 500 MG PO TABS: 500.0000 mg | ORAL_TABLET | Freq: Three times a day (TID) | ORAL | 0 refills | Status: AC

## 2022-02-23 NOTE — Patient Instructions (Addendum)
We will do your blood work today   I have sent in [place] for your vertigo   I have also sent in Robaxin and prednisone for the pinched nerve   Someone will call you to schedule your carotid artery scan

## 2022-02-23 NOTE — Progress Notes (Signed)
Subjective:    Patient ID: James [name], male    DOB: 1955-08-25, 66 y.o.   MRN: [id number]  HPI Patient presents for yearly preventative medicine examination. He is a 66 year old male who  has a past medical history of Arthritis, Bilateral swelling of feet, Chicken pox, Complication of anesthesia, Depression, Hypertension, Joint pain, Obesity, Osteoarthritis, Prediabetes, Shortness of breath, Swelling, and Vitamin D deficiency.  Hypertension-managed with lisinopril 20 mg daily and HCTZ 25 mg daily.  He denies dizziness, lightheadedness, chest pain, or shortness of breath BP Readings from Last 3 Encounters:  02/23/22 120/62  09/08/21 138/72  08/02/21 118/76   Glucose Intolerance -has been on metformin in the past but is no longer taking this  Lab Results  Component Value Date   HGBA1C 5.7 02/23/2021   OSA -is managed by pulmonary.  He does use his CPAP nightly.  Feels that his energy level is better and he wakes up feeling like he gets a good night sleep  Obesity - no longer participating at weight loss clinic.  Wt Readings from Last 3 Encounters:  02/23/22 (!) 357 lb (161.9 kg)  09/08/21 (!) 340 lb (154.2 kg)  08/02/21 (!) 334 lb (151.5 kg)   Acute Issues  - Dizziness -he reports that over the last month he has had dizziness with change in position such as getting up out of bed, from a seated position, and when laying down.  Ports that the world seems to be spinning around him for short period of time.  He has associated blurred vision, headaches, and also a feeling of chronic "plugged up" sensation in his ears.  Family history of coronary artery stenosis-he reports that his father died from coronary artery stenosis and he would like to have this checked  Cervical Radiculopathy -he reports that over the last 3 to 4 months he has been experiencing numbness and tingling in his left upper back that radiates down his left arm.  This happens throughout the day and his arm stays pretty  numb.  Numbness goes from the back down to his fingers.  No loss of grip strength.  All immunizations and health maintenance protocols were reviewed with the patient and needed orders were placed.  Appropriate screening laboratory values were ordered for the patient including screening of hyperlipidemia, renal function and hepatic function. If indicated by BPH, a PSA was ordered.  Medication reconciliation,  past medical history, social history, problem list and allergies were reviewed in detail with the patient  Goals were established with regard to weight loss, exercise, and  diet in compliance with medications  Review of Systems  Constitutional: Negative.   HENT:  Positive for ear pain. Negative for congestion, rhinorrhea, sinus pressure, sinus pain and sore throat.   Eyes: Negative.   Respiratory: Negative.    Cardiovascular: Negative.   Gastrointestinal: Negative.   Endocrine: Negative.   Genitourinary: Negative.   Musculoskeletal:  Positive for arthralgias and gait problem.  Skin: Negative.   Allergic/Immunologic: Negative.   Neurological:  Positive for dizziness, weakness, numbness and headaches. Negative for tremors, seizures, syncope, facial asymmetry, speech difficulty and light-headedness.  Hematological: Negative.   Psychiatric/Behavioral: Negative.    All other systems reviewed and are negative.  Past Medical History:  Diagnosis Date   Arthritis    Bilateral swelling of feet    Chicken pox    Complication of anesthesia    difficulty waking up   Depression    Hypertension    Joint pain  Obesity    Osteoarthritis    Prediabetes    Shortness of breath    Swelling    feet and legs   Vitamin D deficiency     Social History   Socioeconomic History   Marital status: Married    Spouse name: Not on file   Number of children: Not on file   Years of education: Not on file   Highest education level: Not on file  Occupational History   Occupation: At home  caregiver  Tobacco Use   Smoking status: Former    Types: Cigarettes    Quit date: 07/16/1992    Years since quitting: 29.6   Smokeless tobacco: Never  Substance and Sexual Activity   Alcohol use: Yes    Comment: very little per patient    Drug use: No   Sexual activity: Not on file  Other Topics Concern   Not on file  Social History Narrative   Work or School: self employed, 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      Home Situation: living with and caring for wife whom had stroke and brain tumor      Spiritual Beliefs: Methodist      Lifestyle: no regular exercise, diet poor            Social Determinants of Health   Financial Resource Strain: Low Risk  (09/06/2021)   Overall Financial Resource Strain (CARDIA)    Difficulty of Paying Living Expenses: Not very hard  Food Insecurity: No Food Insecurity (09/06/2021)   Hunger Vital Sign    Worried About Running Out of Food in the Last Year: Never true    [place] in the Last Year: Never true  Transportation Needs: No Transportation Needs (09/06/2021)   PRAPARE - Hydrologist (Medical): No    Lack of Transportation (Non-Medical): No  Physical Activity: Inactive (09/06/2021)   Exercise Vital Sign    Days of Exercise per Week: 0 days    Minutes of Exercise per Session: 30 min  Stress: No Stress Concern Present (09/06/2021)   [place]    Feeling of Stress : Not at all  Social Connections: Moderately Isolated (09/06/2021)   Social Connection and Isolation Panel [NHANES]    Frequency of Communication with Friends and Family: Twice a week    Frequency of Social Gatherings with Friends and Family: Never    Attends Religious Services: Never    Marine scientist or Organizations: No    Attends Music therapist: 1 to 4 times per year    Marital Status: Married  Human resources officer Violence: Not At Risk (05/26/2021)   Humiliation,  Afraid, Rape, and Kick questionnaire    Fear of Current or Ex-Partner: No    Emotionally Abused: No    Physically Abused: No    Sexually Abused: No    Past Surgical History:  Procedure Laterality Date   cartilage removal     COLONOSCOPY WITH PROPOFOL N/A 12/14/2019   Procedure: COLONOSCOPY WITH PROPOFOL;  Surgeon: Doran Stabler, MD;  Location: [facility];  Service: Gastroenterology;  Laterality: N/A;   KNEE SURGERY  2000   POLYPECTOMY  12/14/2019   Procedure: POLYPECTOMY;  Surgeon: Doran Stabler, MD;  Location: [facility];  Service: Gastroenterology;;   TOTAL HIP ARTHROPLASTY Left 02/11/2019   Procedure: TOTAL HIP [place];  Surgeon: Gaynelle Arabian, MD;  Location: [facility];  Service: Orthopedics;  Laterality: Left;  13mn    Family History  Problem Relation Age of Onset   Cancer Mother        lung and soft tissue cancer from radiation   Stroke Mother    Obesity Mother    Heart disease Father        CHF   Diabetes Father    Hypertension Father    Obesity Father    Heart disease Maternal Grandfather    Diabetes Paternal Grandmother    Ovarian cancer Sister    Colon cancer Neg Hx    Colon polyps Neg Hx    Esophageal cancer Neg Hx    Rectal cancer Neg Hx    Stomach cancer Neg Hx     No Known Allergies  Current Outpatient Medications on File Prior to Visit  Medication Sig Dispense Refill   acetaminophen (TYLENOL) 500 MG tablet Take 1,000 mg by mouth at bedtime as needed for moderate pain.     amoxicillin (AMOXIL) 500 MG capsule Take 2,000 mg by mouth See admin instructions. Take 2000 mg 1 hour prior to dental work     cetirizine (ZYRTEC) 10 MG tablet Take 10 mg by mouth daily.     cholecalciferol (VITAMIN D3) 25 MCG (1000 UNIT) tablet Take 2,000 Units by mouth daily.     fluticasone (FLONASE) 50 MCG/ACT nasal spray Place 1 spray into both nostrils daily.      furosemide (LASIX) 20 MG tablet Take 1 tablet (20 mg total) by mouth daily. 5 tablet 0    hydrochlorothiazide (HYDRODIURIL) 25 MG tablet TAKE ONE TABLET BY MOUTH ONE TIME DAILY 90 tablet 0   ibuprofen (ADVIL) 200 MG tablet Take 400 mg by mouth at bedtime as needed for moderate pain.     Ketotifen Fumarate (ALLERGY EYE DROPS OP) Place 1 drop into both eyes daily as needed (allergies).     lidocaine (LMX) 4 % cream Apply 1 application topically daily as needed (pain).     lidocaine (LMX) 4 % cream Apply topically.     lisinopril (ZESTRIL) 10 MG tablet TAKE TWO TABLETS BY MOUTH DAILY 180 tablet 0   Multiple Vitamin (MULTIVITAMIN) tablet Take 1 tablet by mouth daily.     No current facility-administered medications on file prior to visit.    BP 120/62   Pulse 72   Temp 98.3 F (36.8 C) (Oral)   Ht '5\' 4"'$  (1.626 m)   Wt (!) 357 lb (161.9 kg)   SpO2 97%   BMI 61.28 kg/m       Objective:   Physical Exam Vitals and nursing note reviewed.  Constitutional:      General: He is not in acute distress.    Appearance: Normal appearance. He is well-developed. He is obese.  HENT:     Head: Normocephalic and atraumatic.     Right Ear: Ear canal and external ear normal. A middle ear effusion is present. There is no impacted cerumen. Tympanic membrane is not erythematous.     Left Ear: Ear canal and external ear normal. A middle ear effusion is present. There is no impacted cerumen. Tympanic membrane is not erythematous.     Nose: Nose normal. No congestion or rhinorrhea.     Left Turbinates: Enlarged and swollen.     Mouth/Throat:     Mouth: Mucous membranes are moist.     Pharynx: Oropharynx is clear. No oropharyngeal exudate or posterior oropharyngeal erythema.  Eyes:     General:  Right eye: No discharge.        Left eye: No discharge.     Extraocular Movements: Extraocular movements intact.     Right eye: Nystagmus (horizontal) present.     Left eye: Nystagmus (horizonal) present.     Conjunctiva/sclera: Conjunctivae normal.     Pupils: Pupils are equal, round, and  reactive to light.  Neck:     Vascular: No carotid bruit.     Trachea: No tracheal deviation.  Cardiovascular:     Rate and Rhythm: Normal rate and regular rhythm.     Pulses: Normal pulses.     Heart sounds: Normal heart sounds. No murmur heard.    No friction rub. No gallop.  Pulmonary:     Effort: Pulmonary effort is normal. No respiratory distress.     Breath sounds: Normal breath sounds. No stridor. No wheezing, rhonchi or rales.  Chest:     Chest wall: No tenderness.  Abdominal:     General: Bowel sounds are normal. There is no distension.     Palpations: Abdomen is soft. There is no mass.     Tenderness: There is no abdominal tenderness. There is no right CVA tenderness, left CVA tenderness, guarding or rebound.     Hernia: No hernia is present.  Musculoskeletal:        General: No swelling, tenderness, deformity or signs of injury. Normal range of motion.     Right lower leg: No edema.     Left lower leg: No edema.  Lymphadenopathy:     Cervical: No cervical adenopathy.  Skin:    General: Skin is warm and dry.     Capillary Refill: Capillary refill takes less than 2 seconds.     Coloration: Skin is not jaundiced or pale.     Findings: No bruising, erythema, lesion or rash.  Neurological:     General: No focal deficit present.     Mental Status: He is alert and oriented to person, place, and time.     Cranial Nerves: No cranial nerve deficit.     Sensory: No sensory deficit.     Motor: No weakness.     Coordination: Coordination normal.     Gait: Gait normal.     Deep Tendon Reflexes: Reflexes normal.  Psychiatric:        Mood and Affect: Mood normal.        Behavior: Behavior normal.        Thought Content: Thought content normal.        Judgment: Judgment normal.       Assessment & Plan:   1. Routine general medical examination at a health care facility - Needs extensive weight loss - Follow up in one year or sooner if needed - CBC with  Differential/Platelet; Future - Comprehensive metabolic panel; Future - Hemoglobin A1c; Future - Lipid panel; Future - TSH; Future - TSH - Lipid panel - Hemoglobin A1c - Comprehensive metabolic panel - CBC with Differential/Platelet  2. Pre-diabetes - Consider agent  - CBC with Differential/Platelet; Future - Comprehensive metabolic panel; Future - Hemoglobin A1c; Future - Lipid panel; Future - TSH; Future - TSH - Lipid panel - Hemoglobin A1c - Comprehensive metabolic panel - CBC with Differential/Platelet  3. Essential hypertension - Well controlled. No change in medication  - CBC with Differential/Platelet; Future - Comprehensive metabolic panel; Future - Hemoglobin A1c; Future - Lipid panel; Future - TSH; Future - TSH - Lipid panel - Hemoglobin A1c - Comprehensive metabolic panel -  CBC with Differential/Platelet  4. Obesity with current BMI of 53.55 - Consider Wegovy or Ozempic. Will see what his A1c is  - CBC with Differential/Platelet; Future - Comprehensive metabolic panel; Future - Hemoglobin A1c; Future - Lipid panel; Future - TSH; Future - TSH - Lipid panel - Hemoglobin A1c - Comprehensive metabolic panel - CBC with Differential/Platelet  5. Other hyperlipidemia  - CBC with Differential/Platelet; Future - Comprehensive metabolic panel; Future - Hemoglobin A1c; Future - Lipid panel; Future - TSH; Future - TSH - Lipid panel - Hemoglobin A1c - Comprehensive metabolic panel - CBC with Differential/Platelet  6. Prostate cancer screening  - PSA; Future - PSA  7. Vertigo - Symptoms consistent with vertigo. Will trial him on Antivert - Consider referral to vestibular rehab  - meclizine (ANTIVERT) 25 MG tablet; Take 1 tablet (25 mg total) by mouth 3 (three) times daily as needed for dizziness.  Dispense: 30 tablet; Refill: 0  8. Eustachian tube dysfunction, bilateral - Flonase  9. Cervical radiculopathy - This office does not have xray  today. Will treat and follow up with next week.  - Consider cervical spine xray  - methocarbamol (ROBAXIN) 500 MG tablet; Take 1 tablet (500 mg total) by mouth 3 (three) times daily for 7 days.  Dispense: 21 tablet; Refill: 0 - methylPREDNISolone (MEDROL DOSEPAK) 4 MG TBPK tablet; Take as directed  Dispense: 21 tablet; Refill: 0  10. OSA (obstructive sleep apnea) - Per pulmonary   11. Family history of coronary artery disease  - US Carotid Duplex Bilateral; Future  Dorothyann Peng, NP

## 2022-02-27 ENCOUNTER — Encounter: Payer: Self-pay | Admitting: Adult Health

## 2022-02-27 ENCOUNTER — Encounter: Payer: PPO | Admitting: Adult Health

## 2022-02-27 NOTE — Telephone Encounter (Signed)
FYI

## 2022-02-28 ENCOUNTER — Other Ambulatory Visit: Payer: Self-pay | Admitting: Adult Health

## 2022-02-28 DIAGNOSIS — R7303 Prediabetes: Secondary | ICD-10-CM

## 2022-02-28 DIAGNOSIS — R7301 Impaired fasting glucose: Secondary | ICD-10-CM

## 2022-02-28 MED ORDER — SEMAGLUTIDE(0.25 OR 0.5MG/DOS) 2 MG/3ML ~~LOC~~ SOPN: 0.2500 mg | PEN_INJECTOR | SUBCUTANEOUS | 0 refills | Status: DC

## 2022-03-05 ENCOUNTER — Other Ambulatory Visit: Payer: PPO

## 2022-03-08 ENCOUNTER — Encounter: Payer: Self-pay | Admitting: Pulmonary Disease

## 2022-03-08 ENCOUNTER — Ambulatory Visit
Admission: RE | Admit: 2022-03-08 | Discharge: 2022-03-08 | Disposition: A | Discharge status: Home / Self Care | Payer: PPO | Source: Ambulatory Visit | Attending: Adult Health | Admitting: Adult Health

## 2022-03-08 ENCOUNTER — Other Ambulatory Visit: Payer: Self-pay | Admitting: Adult Health

## 2022-03-08 ENCOUNTER — Other Ambulatory Visit: Payer: PPO

## 2022-03-08 DIAGNOSIS — I771 Stricture of artery: Secondary | ICD-10-CM | POA: Diagnosis not present

## 2022-03-08 DIAGNOSIS — I10 Essential (primary) hypertension: Secondary | ICD-10-CM | POA: Diagnosis not present

## 2022-03-08 DIAGNOSIS — R42 Dizziness and giddiness: Secondary | ICD-10-CM

## 2022-03-08 DIAGNOSIS — Z8249 Family history of ischemic heart disease and other diseases of the circulatory system: Secondary | ICD-10-CM

## 2022-03-12 ENCOUNTER — Telehealth: Payer: Self-pay | Admitting: Pulmonary Disease

## 2022-03-12 NOTE — Telephone Encounter (Signed)
CPAP compliance report reviewed for the last 30 days 97% compliance AutoSet 5-20 95 percentile pressure 14.5 Residual AHI of 7.4  His initial study did show an AHI of 51.5  I did call the patient to reassure him to keep an eye on the AHI is  Follow-up as scheduled

## 2022-03-12 NOTE — Telephone Encounter (Signed)
Can we get a download from his machine for me to review  He is on an auto set of 5-20 and this should adjust for events  Is there mask fit issues?   If not doing well, best option will be a titration sleep study to make sure events are well treated with a fixed pressure which can be ascertained during a monitored sleep study

## 2022-03-12 NOTE — Telephone Encounter (Signed)
Sir,  I pulled the download and put it on your desk.   Can you just let me know what you would like to do   Thank you

## 2022-03-12 NOTE — Telephone Encounter (Signed)
Can we get a download from his machine for me to review   He is on an auto set of 5-20 and this should adjust for events   Is there mask fit issues?     If not doing well, best option will be a titration sleep study to make sure events are well treated with a fixed pressure which can be ascertained during a monitored sleep study

## 2022-03-13 ENCOUNTER — Ambulatory Visit: Payer: PPO | Admitting: Pulmonary Disease

## 2022-03-14 NOTE — Telephone Encounter (Signed)
Called and discussed download with patient

## 2022-03-21 ENCOUNTER — Other Ambulatory Visit: Payer: Self-pay

## 2022-03-21 ENCOUNTER — Ambulatory Visit: Payer: PPO | Attending: Adult Health | Admitting: Rehabilitative and Restorative Service Providers"

## 2022-03-21 ENCOUNTER — Encounter: Payer: Self-pay | Admitting: Rehabilitative and Restorative Service Providers"

## 2022-03-21 DIAGNOSIS — M6281 Muscle weakness (generalized): Secondary | ICD-10-CM

## 2022-03-21 DIAGNOSIS — R42 Dizziness and giddiness: Secondary | ICD-10-CM | POA: Insufficient documentation

## 2022-03-21 DIAGNOSIS — R2689 Other abnormalities of gait and mobility: Secondary | ICD-10-CM

## 2022-03-21 DIAGNOSIS — M542 Cervicalgia: Secondary | ICD-10-CM

## 2022-03-21 DIAGNOSIS — R252 Cramp and spasm: Secondary | ICD-10-CM

## 2022-03-21 NOTE — Therapy (Signed)
OUTPATIENT PHYSICAL THERAPY VESTIBULAR EVALUATION     Patient Name: James [name] MRN: [id number] DOB:06-22-1955, 66 y.o., male Today's Date: 03/21/2022   PT End of Session - 03/21/22 0805     Visit Number 1    Date for PT Re-Evaluation 05/16/22    Authorization Type [employer]    Progress Note Due on Visit 10    PT Start Time 0800    PT Stop Time 0840    PT Time Calculation (min) 40 min    Activity Tolerance Patient tolerated treatment well    Behavior During Therapy [facility] for tasks assessed/performed             Past Medical History:  Diagnosis Date   Arthritis    Bilateral swelling of feet    Chicken pox    Complication of anesthesia    difficulty waking up   Depression    Hypertension    Joint pain    Obesity    Osteoarthritis    Prediabetes    Shortness of breath    Swelling    feet and legs   Vitamin D deficiency    Past Surgical History:  Procedure Laterality Date   cartilage removal     COLONOSCOPY WITH PROPOFOL N/A 12/14/2019   Procedure: COLONOSCOPY WITH PROPOFOL;  Surgeon: Doran Stabler, MD;  Location: [facility];  Service: Gastroenterology;  Laterality: N/A;   KNEE SURGERY  2000   POLYPECTOMY  12/14/2019   Procedure: POLYPECTOMY;  Surgeon: Doran Stabler, MD;  Location: [facility];  Service: Gastroenterology;;   TOTAL HIP ARTHROPLASTY Left 02/11/2019   Procedure: TOTAL HIP [place];  Surgeon: Gaynelle Arabian, MD;  Location: [facility];  Service: Orthopedics;  Laterality: Left;  140mn   Patient Active Problem List   Diagnosis Date Noted   Depression 09/08/2020   Special screening for malignant neoplasms, colon    Benign neoplasm of rectum    OA (osteoarthritis) of hip 02/11/2019   Other hyperlipidemia 04/10/2017   Vitamin D deficiency 04/10/2017   Prediabetes 12/17/2016   Class 3 severe obesity with serious comorbidity and body mass index (BMI) of 50.0 to 59.9 in adult ([facility] 12/17/2016   At risk for diabetes  mellitus 10/02/2016   Hyperglycemia 09/04/2016   Severe obesity (BMI >= 40) ([facility] 01/15/2013   OBESITY, MORBID 01/20/2007   Essential hypertension 10/31/2006    PCP: NDorothyann Peng NP REFERRING PROVIDER: NDorothyann Peng NP  REFERRING DIAG: R42 (ICD-10-CM) - Vertigo  THERAPY DIAG:  Dizziness and giddiness - Plan: PT plan of care cert/re-cert  Other abnormalities of gait and mobility - Plan: PT plan of care cert/re-cert  Muscle weakness (generalized) - Plan: PT plan of care cert/re-cert  Cervicalgia - Plan: PT plan of care cert/re-cert  Cramp and spasm - Plan: PT plan of care cert/re-cert  ONSET DATE: At least a couple of months  Rationale for Evaluation and Treatment: Rehabilitation  SUBJECTIVE:   SUBJECTIVE STATEMENT: Pt reports that he started being short of breath and some difficulty breathing.  Pt reports that when he would lie on his left side, his left arm would get numb.  Pt reports now having radiculopathy in both arms.  Pt states that then he would start getting dizzy when he would lie flat in bed.  Pt states that the Meclizine helped some and the dizziness has gotten better, but that it has not subsided.  Pt reports that he has been heaving headaches.  Pt uses CPAP machine. Pt accompanied  by: self  PERTINENT HISTORY: pre-diabetes, HTN, cervical radiculopathy, OSA  PAIN:  Are you having pain? Yes: NPRS scale: 3-8/10 Pain location: scapular and knee Pain description: sharp Aggravating factors: activity Relieving factors: ice, medication  PRECAUTIONS: Fall  WEIGHT BEARING RESTRICTIONS: No  FALLS: Has patient fallen in last 6 months? No  LIVING ENVIRONMENT: Lives with: lives with their spouse lives with wife who is primarily w/c bound and needs assistance with most daily activities Lives in: House/apartment Stairs:  2 story home, but they live on one level.  They have a platform lift to help get to driveway. Has following equipment at home: Single point cane  and Grab bars  PLOF: Independent and Leisure: watching tv and sports  PATIENT GOALS: To be able to function again and get rid of the dizziness.  OBJECTIVE:   DIAGNOSTIC FINDINGS: Unremarkable carotid Doppler ultrasound   COGNITION: Overall cognitive status: Within functional limits for tasks assessed   SENSATION: Reports numbness and tingling down both arms   POSTURE:  rounded shoulders and forward head  Cervical ROM:    Active A/ROM (deg) eval  Flexion 60  Extension 20  Right lateral flexion 40  Left lateral flexion 30  Right rotation 50  Left rotation 40  (Blank rows = not tested)  STRENGTH:   Eval: BUE strength is WFL Bilateral hips 4/5 Right quad/hamstring 4/5   GAIT: Gait pattern: decreased stride length, antalgic, and wide BOS Distance walked: 100 ft Assistive device utilized: Single point cane Level of assistance: Modified independence Comments: Pt with dyspnea on exertion and increased pain  FUNCTIONAL TESTS:  Timed up and go (TUG): 19.3 sec without assistive device, RPE of 5/10  PATIENT SURVEYS:  DHI 68 / 100 Physical Score: 18 / 28 Emotional Score: 14 / 36 Functional Score: 36 / 36  VESTIBULAR ASSESSMENT:  SYMPTOM BEHAVIOR:  Subjective history: Pt reports having dizziness for at least 2 months  Non-Vestibular symptoms: changes in hearing, changes in vision, neck pain, headaches, and nausea/vomiting  Type of dizziness: Blurred Vision, Imbalance (Disequilibrium), and Spinning/Vertigo  Frequency: daily  Duration: varies, but sometimes more than a couple of hours  Aggravating factors: Induced by motion: occur when walking, looking up at the ceiling, bending down to the ground, driving, and activity in general and "any type of exertion"  Relieving factors: head stationary and closing eyes  Progression of symptoms: unchanged  OCULOMOTOR EXAM:  Ocular Alignment: normal  Ocular ROM: No Limitations  Spontaneous Nystagmus: absent  Gaze-Induced  Nystagmus: absent  Smooth Pursuits:  occasional nystagmus noted with tracking towards right side  Saccades: slow   VESTIBULAR - OCULAR REFLEX:   Positive head thrust   POSITIONAL TESTING: Right Dix-Hallpike: no nystagmus Left Dix-Hallpike: no nystagmus    OTHOSTATICS: not done   VESTIBULAR TREATMENT:                                                                                                   DATE: 03/21/2022 Pencil pushups Visual Tracking horizontal and vertical x10  PATIENT EDUCATION: Education details: Pt issued HEP Person educated: Patient Education method: Explanation,  Demonstration, and Handouts Education comprehension: verbalized understanding and returned demonstration  HOME EXERCISE PROGRAM: Access Code: [email or web address] URL: [email or web address] Date: 03/21/2022 Prepared by: Shelby Dubin [place]ri  Exercises - Pencil Pushups  - 2 x daily - 7 x weekly - 2 sets - 10 reps - Seated Vertical Smooth Pursuit  - 2 x daily - 7 x weekly - 2 sets - 10 reps - Seated Diagonal Smooth Pursuit  - 2 x daily - 7 x weekly - 2 sets - 10 reps - Seated Horizontal Smooth Pursuit  - 2 x daily - 7 x weekly - 2 sets - 10 reps  GOALS: Goals reviewed with patient? Yes  SHORT TERM GOALS: Target date: 04/11/2022   Pt will be independent with initial HEP. Baseline: Goal status: INITIAL  2.  Pt will report at least a 30% improvement in dizziness. Baseline:  Goal status: INITIAL   LONG TERM GOALS: Target date: 05/16/2022    Pt will be independent with advanced HEP. Baseline:  Goal status: INITIAL  2.  Pt will improve Dizziness Handicapped Inventory to no greater than 50% to demonstrate pts improved functional independence. Baseline: 68/100 Goal status: INITIAL  3.  Pt will increase hip and knee strength to at least 4+/5 to allow him to perform transfers with improved ease. Baseline:  Goal status: INITIAL  4.  Pt will be able to ambulate at least 15 minutes with RPE no  greater than 5/10 and without a loss of balance. Baseline: RPE of 5/10 with TUG Goal status: INITIAL   ASSESSMENT:  CLINICAL IMPRESSION: Patient is a 66 y.o. male who was seen today for physical therapy evaluation and treatment for vertigo. Pts PLOF is taking care of his wife who is primarily wheelchair bound.  Pt states that he has started having to use his SPC secondary to feeling more insecure.  Pt with dyspnea noted on exertion, even during TUG assessment.  Pt presents with muscle weakness, decreased balance, difficulty walking, vestibular hypofunctioning, and difficulty performing functional mobility and caring for his wife.  Pt would benefit from skilled PT to progress towards goal related activities.  OBJECTIVE IMPAIRMENTS: Abnormal gait, decreased activity tolerance, decreased balance, difficulty walking, decreased strength, dizziness, impaired flexibility, postural dysfunction, obesity, and pain.   ACTIVITY LIMITATIONS: bending, standing, squatting, stairs, locomotion level, and caring for others  PARTICIPATION LIMITATIONS: cleaning and community activity  PERSONAL FACTORS: 3+ comorbidities: obesity, HTN, pre-diabetes, obstructive sleep apnea, cervical radiculopathy  are also affecting patient's functional outcome.   REHAB POTENTIAL: Good  CLINICAL DECISION MAKING: Evolving/moderate complexity  EVALUATION COMPLEXITY: Moderate   PLAN:  PT FREQUENCY: 2x/week  PT DURATION: 8 weeks  PLANNED INTERVENTIONS: Therapeutic exercises, Therapeutic activity, Neuromuscular re-education, Balance training, Gait training, Patient/Family education, Self Care, Joint mobilization, Joint manipulation, Stair training, Vestibular training, Canalith repositioning, Aquatic Therapy, Dry Needling, Electrical stimulation, Spinal manipulation, Spinal mobilization, Cryotherapy, Moist heat, Taping, Ultrasound, Ionotophoresis '4mg'$ /ml Dexamethasone, Manual therapy, and Re-evaluation  PLAN FOR NEXT SESSION:  assess and progress HEP, vestibular rehab   [place], PT 03/21/2022, 11:32 AM   [facility] [address] [place], [place] [address] Phone # [phone] Fax [phone]

## 2022-03-27 ENCOUNTER — Ambulatory Visit: Payer: PPO | Admitting: Rehabilitative and Restorative Service Providers"

## 2022-04-02 ENCOUNTER — Ambulatory Visit: Payer: PPO | Admitting: Rehabilitative and Restorative Service Providers"

## 2022-04-02 ENCOUNTER — Encounter: Payer: Self-pay | Admitting: Rehabilitative and Restorative Service Providers"

## 2022-04-02 DIAGNOSIS — R42 Dizziness and giddiness: Secondary | ICD-10-CM | POA: Diagnosis not present

## 2022-04-02 DIAGNOSIS — M542 Cervicalgia: Secondary | ICD-10-CM

## 2022-04-02 DIAGNOSIS — M6281 Muscle weakness (generalized): Secondary | ICD-10-CM

## 2022-04-02 DIAGNOSIS — R2689 Other abnormalities of gait and mobility: Secondary | ICD-10-CM

## 2022-04-02 DIAGNOSIS — R252 Cramp and spasm: Secondary | ICD-10-CM

## 2022-04-02 NOTE — Therapy (Signed)
OUTPATIENT PHYSICAL THERAPY VESTIBULAR TREATMENT NOTE     Patient Name: James [name] MRN: [id number] DOB:07-07-1955, 66 y.o., male Today's Date: 04/02/2022   PT End of Session - 04/02/22 1229     Visit Number 2    Date for PT Re-Evaluation 05/16/22    Authorization Type [employer]    Progress Note Due on Visit 10    PT Start Time 1225    PT Stop Time 1305    PT Time Calculation (min) 40 min    Activity Tolerance Patient tolerated treatment well    Behavior During Therapy WFL for tasks assessed/performed             Past Medical History:  Diagnosis Date   Arthritis    Bilateral swelling of feet    Chicken pox    Complication of anesthesia    difficulty waking up   Depression    Hypertension    Joint pain    Obesity    Osteoarthritis    Prediabetes    Shortness of breath    Swelling    feet and legs   Vitamin D deficiency    Past Surgical History:  Procedure Laterality Date   cartilage removal     COLONOSCOPY WITH PROPOFOL N/A 12/14/2019   Procedure: COLONOSCOPY WITH PROPOFOL;  Surgeon: Doran Stabler, MD;  Location: [facility];  Service: Gastroenterology;  Laterality: N/A;   KNEE SURGERY  2000   POLYPECTOMY  12/14/2019   Procedure: POLYPECTOMY;  Surgeon: Doran Stabler, MD;  Location: [facility];  Service: Gastroenterology;;   TOTAL HIP ARTHROPLASTY Left 02/11/2019   Procedure: TOTAL HIP [place];  Surgeon: Gaynelle Arabian, MD;  Location: [facility];  Service: Orthopedics;  Laterality: Left;  171mn   Patient Active Problem List   Diagnosis Date Noted   Depression 09/08/2020   Special screening for malignant neoplasms, colon    Benign neoplasm of rectum    OA (osteoarthritis) of hip 02/11/2019   Other hyperlipidemia 04/10/2017   Vitamin D deficiency 04/10/2017   Prediabetes 12/17/2016   Class 3 severe obesity with serious comorbidity and body mass index (BMI) of 50.0 to 59.9 in adult ([facility] 12/17/2016   At risk for diabetes  mellitus 10/02/2016   Hyperglycemia 09/04/2016   Severe obesity (BMI >= 40) ([facility] 01/15/2013   OBESITY, MORBID 01/20/2007   Essential hypertension 10/31/2006    PCP: NDorothyann Peng NP REFERRING PROVIDER: NDorothyann Peng NP  REFERRING DIAG: R42 (ICD-10-CM) - Vertigo  THERAPY DIAG:  Dizziness and giddiness  Other abnormalities of gait and mobility  Muscle weakness (generalized)  Cervicalgia  Cramp and spasm  ONSET DATE: At least a couple of months  Rationale for Evaluation and Treatment: Rehabilitation  SUBJECTIVE:   SUBJECTIVE STATEMENT: Pt reports that he has been doing his exercises, but has a lot of difficulty with tracking exercises.  Pt states that he has been having some increased dizziness, at times when just sitting still and not doing anything. Pt accompanied by: self  PERTINENT HISTORY: pre-diabetes, HTN, cervical radiculopathy, OSA  PAIN:  Are you having pain? Yes: NPRS scale: 7/10 Pain location: scapular and knee Pain description: sharp Aggravating factors: activity Relieving factors: ice, medication  PRECAUTIONS: Fall  WEIGHT BEARING RESTRICTIONS: No  FALLS: Has patient fallen in last 6 months? No  LIVING ENVIRONMENT: Lives with: lives with their spouse lives with wife who is primarily w/c bound and needs assistance with most daily activities Lives in: House/apartment Stairs:  2 story home,  but they live on one level.  They have a platform lift to help get to driveway. Has following equipment at home: Single point cane and Grab bars  PLOF: Independent and Leisure: watching tv and sports  PATIENT GOALS: To be able to function again and get rid of the dizziness.  OBJECTIVE:   DIAGNOSTIC FINDINGS: Unremarkable carotid Doppler ultrasound   COGNITION: Overall cognitive status: Within functional limits for tasks assessed   SENSATION: Reports numbness and tingling down both arms   POSTURE:  rounded shoulders and forward head  Cervical ROM:     Active A/ROM (deg) eval  Flexion 60  Extension 20  Right lateral flexion 40  Left lateral flexion 30  Right rotation 50  Left rotation 40  (Blank rows = not tested)  STRENGTH:   Eval: BUE strength is WFL Bilateral hips 4/5 Right quad/hamstring 4/5   GAIT: Gait pattern: decreased stride length, antalgic, and wide BOS Distance walked: 100 ft Assistive device utilized: Single point cane Level of assistance: Modified independence Comments: Pt with dyspnea on exertion and increased pain  FUNCTIONAL TESTS:  Eval: Timed up and go (TUG): 19.3 sec without assistive device, RPE of 5/10  04/02/2022: Dynamic Visual Acuity:  Can read 20/20 on chart normally, with fast horizontal head movements can read 20/50 (difference of 4 lines)  PATIENT SURVEYS:  Eval: DHI 68 / 100 Physical Score: 18 / 28 Emotional Score: 14 / 36 Functional Score: 36 / 36  VESTIBULAR ASSESSMENT:  SYMPTOM BEHAVIOR:  Subjective history: Pt reports having dizziness for at least 2 months  Non-Vestibular symptoms: changes in hearing, changes in vision, neck pain, headaches, and nausea/vomiting  Type of dizziness: Blurred Vision, Imbalance (Disequilibrium), and Spinning/Vertigo  Frequency: daily  Duration: varies, but sometimes more than a couple of hours  Aggravating factors: Induced by motion: occur when walking, looking up at the ceiling, bending down to the ground, driving, and activity in general and "any type of exertion"  Relieving factors: head stationary and closing eyes  Progression of symptoms: unchanged  OCULOMOTOR EXAM:  Ocular Alignment: normal  Ocular ROM: No Limitations  Spontaneous Nystagmus: absent  Gaze-Induced Nystagmus: absent  Smooth Pursuits:  occasional nystagmus noted with tracking towards right side  Saccades: slow   VESTIBULAR - OCULAR REFLEX:   Positive head thrust   POSITIONAL TESTING: Right Dix-Hallpike: no nystagmus Left Dix-Hallpike: no nystagmus    OTHOSTATICS:  not done   VESTIBULAR TREATMENT:                                                                                                    DATE: 04/02/2022 Pencil pushups 2x10 Visual Tracking horizontal and vertical 2x10 VOR in sitting for horizontal and vertical x1 min each with recovery periods when dizziness experienced Dynamic visual acuity with Snellen Eye Chart (difference of 4 lines) Chin Tucks 2x10 Seated cervical rotation and cervical extension x15 each Seated upper trap stretch 2x20 sec bilat   DATE: 03/21/2022 Pencil pushups Visual Tracking horizontal and vertical x10  PATIENT EDUCATION: Education details: Pt issued HEP Person educated: Patient Education method: Consulting civil engineer, Demonstration,  and Handouts Education comprehension: verbalized understanding and returned demonstration  HOME EXERCISE PROGRAM: Access Code: [email or web address] URL: [email or web address] Date: 04/02/2022 Prepared by: Shelby Dubin Jamyia Fortune  Exercises - Pencil Pushups  - 2 x daily - 7 x weekly - 2 sets - 10 reps - Seated Vertical Smooth Pursuit  - 2 x daily - 7 x weekly - 2 sets - 10 reps - Seated Diagonal Smooth Pursuit  - 2 x daily - 7 x weekly - 2 sets - 10 reps - Seated Horizontal Smooth Pursuit  - 2 x daily - 7 x weekly - 2 sets - 10 reps - Seated Gaze Stabilization with Head Rotation  - 1 x daily - 7 x weekly - 3 sets - 10 reps - Seated Gaze Stabilization with Head Nod  - 1 x daily - 7 x weekly - 3 sets - 10 reps - Seated Cervical Retraction  - 1 x daily - 7 x weekly - 2 sets - 10 reps - Seated Scapular Retraction  - 1 x daily - 7 x weekly - 2 sets - 10 reps - Seated Cervical Rotation AROM  - 1 x daily - 7 x weekly - 2 sets - 10 reps - Seated Cervical Extension AROM  - 1 x daily - 7 x weekly - 2 sets - 10 reps - Seated Upper Trapezius Stretch  - 1 x daily - 7 x weekly - 1 sets - 2 reps - 20 sec hold  GOALS: Goals reviewed with patient? Yes  SHORT TERM GOALS: Target date: 04/11/2022   Pt will  be independent with initial HEP. Baseline: Goal status: IN PROGRESS  2.  Pt will report at least a 30% improvement in dizziness. Baseline:  Goal status: IN PROGRESS   LONG TERM GOALS: Target date: 05/16/2022    Pt will be independent with advanced HEP. Baseline:  Goal status: INITIAL  2.  Pt will improve Dizziness Handicapped Inventory to no greater than 50% to demonstrate pts improved functional independence. Baseline: 68/100 Goal status: INITIAL  3.  Pt will increase hip and knee strength to at least 4+/5 to allow him to perform transfers with improved ease. Baseline:  Goal status: INITIAL  4.  Pt will be able to ambulate at least 15 minutes with RPE no greater than 5/10 and without a loss of balance. Baseline: RPE of 5/10 with TUG Goal status: INITIAL   ASSESSMENT:  CLINICAL IMPRESSION: Mr Slaven presents to skilled PT reporting continued dizziness symptoms.  Pt is able to provoke dizziness with HEP.  Able to add in gaze stabilization exercises for VOR in sitting for both horizontal and vertical with dizziness reported with both motions.  Pt with increased variance noted with Dynamic Visual Acuity of 4 lines with dizziness reported and pt needed to return to seated position.  Pt with reports of decreased UE numbness following upper trap stretch.  Pt continues to require skilled PT to progress towards goal related activities.    OBJECTIVE IMPAIRMENTS: Abnormal gait, decreased activity tolerance, decreased balance, difficulty walking, decreased strength, dizziness, impaired flexibility, postural dysfunction, obesity, and pain.   ACTIVITY LIMITATIONS: bending, standing, squatting, stairs, locomotion level, and caring for others  PARTICIPATION LIMITATIONS: cleaning and community activity  PERSONAL FACTORS: 3+ comorbidities: obesity, HTN, pre-diabetes, obstructive sleep apnea, cervical radiculopathy  are also affecting patient's functional outcome.   REHAB POTENTIAL:  Good  CLINICAL DECISION MAKING: Evolving/moderate complexity  EVALUATION COMPLEXITY: Moderate   PLAN:  PT FREQUENCY: 2x/week  PT DURATION: 8 weeks  PLANNED  INTERVENTIONS: Therapeutic exercises, Therapeutic activity, Neuromuscular re-education, Balance training, Gait training, Patient/Family education, Self Care, Joint mobilization, Joint manipulation, Stair training, Vestibular training, Canalith repositioning, Aquatic Therapy, Dry Needling, Electrical stimulation, Spinal manipulation, Spinal mobilization, Cryotherapy, Moist heat, Taping, Ultrasound, Ionotophoresis '4mg'$ /ml Dexamethasone, Manual therapy, and Re-evaluation  PLAN FOR NEXT SESSION: assess and progress HEP, vestibular rehab   [place], PT 04/02/2022, 1:13 PM   [facility] [address], [place] [place],  [address] Phone # [phone] Fax [phone]

## 2022-04-05 ENCOUNTER — Encounter: Payer: PPO | Admitting: Rehabilitative and Restorative Service Providers"

## 2022-04-09 ENCOUNTER — Ambulatory Visit: Payer: PPO | Attending: Adult Health | Admitting: Rehabilitative and Restorative Service Providers"

## 2022-04-09 ENCOUNTER — Encounter: Payer: Self-pay | Admitting: Rehabilitative and Restorative Service Providers"

## 2022-04-09 DIAGNOSIS — R42 Dizziness and giddiness: Secondary | ICD-10-CM | POA: Diagnosis not present

## 2022-04-09 DIAGNOSIS — R2689 Other abnormalities of gait and mobility: Secondary | ICD-10-CM | POA: Insufficient documentation

## 2022-04-09 DIAGNOSIS — R252 Cramp and spasm: Secondary | ICD-10-CM | POA: Insufficient documentation

## 2022-04-09 DIAGNOSIS — M6281 Muscle weakness (generalized): Secondary | ICD-10-CM | POA: Diagnosis not present

## 2022-04-09 DIAGNOSIS — M542 Cervicalgia: Secondary | ICD-10-CM | POA: Insufficient documentation

## 2022-04-09 NOTE — Therapy (Signed)
OUTPATIENT PHYSICAL THERAPY VESTIBULAR TREATMENT NOTE     Patient Name: ATHAN CASALINO MRN: [id number] DOB:18-Nov-1955, 66 y.o., male Today's Date: 04/09/2022   PT End of Session - 04/09/22 1235     Visit Number 3    Date for PT Re-Evaluation 05/16/22    Authorization Type [employer]    Progress Note Due on Visit 10    PT Start Time 1230    PT Stop Time 1310    PT Time Calculation (min) 40 min    Activity Tolerance Patient tolerated treatment well    Behavior During Therapy WFL for tasks assessed/performed             Past Medical History:  Diagnosis Date   Arthritis    Bilateral swelling of feet    Chicken pox    Complication of anesthesia    difficulty waking up   Depression    Hypertension    Joint pain    Obesity    Osteoarthritis    Prediabetes    Shortness of breath    Swelling    feet and legs   Vitamin D deficiency    Past Surgical History:  Procedure Laterality Date   cartilage removal     COLONOSCOPY WITH PROPOFOL N/A 12/14/2019   Procedure: COLONOSCOPY WITH PROPOFOL;  Surgeon: Doran Stabler, MD;  Location: [facility];  Service: Gastroenterology;  Laterality: N/A;   KNEE SURGERY  2000   POLYPECTOMY  12/14/2019   Procedure: POLYPECTOMY;  Surgeon: Doran Stabler, MD;  Location: [facility];  Service: Gastroenterology;;   TOTAL HIP ARTHROPLASTY Left 02/11/2019   Procedure: TOTAL HIP [place];  Surgeon: Gaynelle Arabian, MD;  Location: [facility];  Service: Orthopedics;  Laterality: Left;  165mn   Patient Active Problem List   Diagnosis Date Noted   Depression 09/08/2020   Special screening for malignant neoplasms, colon    Benign neoplasm of rectum    OA (osteoarthritis) of hip 02/11/2019   Other hyperlipidemia 04/10/2017   Vitamin D deficiency 04/10/2017   Prediabetes 12/17/2016   Class 3 severe obesity with serious comorbidity and body mass index (BMI) of 50.0 to 59.9 in adult ([place] 12/17/2016   At risk for diabetes  mellitus 10/02/2016   Hyperglycemia 09/04/2016   Severe obesity (BMI >= 40) ([place] 01/15/2013   OBESITY, MORBID 01/20/2007   Essential hypertension 10/31/2006    PCP: NDorothyann Peng NP REFERRING PROVIDER: NDorothyann Peng NP  REFERRING DIAG: R42 (ICD-10-CM) - Vertigo  THERAPY DIAG:  Dizziness and giddiness  Other abnormalities of gait and mobility  Muscle weakness (generalized)  Cervicalgia  Cramp and spasm  ONSET DATE: At least a couple of months  Rationale for Evaluation and Treatment: Rehabilitation  SUBJECTIVE:   SUBJECTIVE STATEMENT: Pt reports that his wife had another seizure last week and he was afraid to leave her following.  Pt reports that his dizziness has improved at least 40% since initial evaluation, but he still intermittently has dizziness.  Does state that his neck is feeling looser with the stretches. Pt accompanied by: self  PERTINENT HISTORY: pre-diabetes, HTN, cervical radiculopathy, OSA  PAIN:  Are you having pain? Yes: NPRS scale: 2/10 Pain location: knee Pain description: sharp Aggravating factors: activity Relieving factors: ice, medication  PRECAUTIONS: Fall  WEIGHT BEARING RESTRICTIONS: No  FALLS: Has patient fallen in last 6 months? No  LIVING ENVIRONMENT: Lives with: lives with their spouse lives with wife who is primarily w/c bound and needs assistance with most daily  activities Lives in: House/apartment Stairs:  2 story home, but they live on one level.  They have a platform lift to help get to driveway. Has following equipment at home: Single point cane and Grab bars  PLOF: Independent and Leisure: watching tv and sports  PATIENT GOALS: To be able to function again and get rid of the dizziness.  OBJECTIVE:   DIAGNOSTIC FINDINGS: Unremarkable carotid Doppler ultrasound   COGNITION: Overall cognitive status: Within functional limits for tasks assessed   SENSATION: Reports numbness and tingling down both arms   POSTURE:   rounded shoulders and forward head  Cervical ROM:    Active A/ROM (deg) eval  Flexion 60  Extension 20  Right lateral flexion 40  Left lateral flexion 30  Right rotation 50  Left rotation 40  (Blank rows = not tested)  STRENGTH:   Eval: BUE strength is WFL Bilateral hips 4/5 Right quad/hamstring 4/5   GAIT: Gait pattern: decreased stride length, antalgic, and wide BOS Distance walked: 100 ft Assistive device utilized: Single point cane Level of assistance: Modified independence Comments: Pt with dyspnea on exertion and increased pain  FUNCTIONAL TESTS:  Eval: Timed up and go (TUG): 19.3 sec without assistive device, RPE of 5/10  04/02/2022: Dynamic Visual Acuity:  Can read 20/20 on chart normally, with fast horizontal head movements can read 20/50 (difference of 4 lines)  PATIENT SURVEYS:  Eval: DHI 68 / 100 Physical Score: 18 / 28 Emotional Score: 14 / 36 Functional Score: 36 / 36  VESTIBULAR ASSESSMENT:  SYMPTOM BEHAVIOR:  Subjective history: Pt reports having dizziness for at least 2 months  Non-Vestibular symptoms: changes in hearing, changes in vision, neck pain, headaches, and nausea/vomiting  Type of dizziness: Blurred Vision, Imbalance (Disequilibrium), and Spinning/Vertigo  Frequency: daily  Duration: varies, but sometimes more than a couple of hours  Aggravating factors: Induced by motion: occur when walking, looking up at the ceiling, bending down to the ground, driving, and activity in general and "any type of exertion"  Relieving factors: head stationary and closing eyes  Progression of symptoms: unchanged  OCULOMOTOR EXAM:  Ocular Alignment: normal  Ocular ROM: No Limitations  Spontaneous Nystagmus: absent  Gaze-Induced Nystagmus: absent  Smooth Pursuits:  occasional nystagmus noted with tracking towards right side  Saccades: slow   VESTIBULAR - OCULAR REFLEX:   Positive head thrust   POSITIONAL TESTING: Right Dix-Hallpike: no  nystagmus Left Dix-Hallpike: no nystagmus    OTHOSTATICS: not done   VESTIBULAR TREATMENT:                                                                                                     DATE: 04/09/2022 Marye Round positive for "eye fluttering" (per patient) without nystagmus noted.  Proceeded to treatment with Epley Maneuver x2. Pencil pushups 2x10 Seated upper trap stretch 2x20 sec bilat Chin Tucks 2x10 Scapular retraction 2x10 Standing performing horizontal head turns x10 Ambulation performing head turns and head nods 2x10 ft each Nustep level 1 x4 min with PT present to discuss status   DATE: 04/02/2022 Pencil pushups 2x10 Visual Tracking  horizontal and vertical 2x10 VOR in sitting for horizontal and vertical x1 min each with recovery periods when dizziness experienced Dynamic visual acuity with Snellen Eye Chart (difference of 4 lines) Chin Tucks 2x10 Seated cervical rotation and cervical extension x15 each Seated upper trap stretch 2x20 sec bilat   DATE: 03/21/2022 Pencil pushups Visual Tracking horizontal and vertical x10  PATIENT EDUCATION: Education details: Pt issued HEP Person educated: Patient Education method: Explanation, Demonstration, and Handouts Education comprehension: verbalized understanding and returned demonstration  HOME EXERCISE PROGRAM: Access Code: [email or web address] URL: [email or web address] Date: 04/02/2022 Prepared by: Shelby Dubin Menke  Exercises - Pencil Pushups  - 2 x daily - 7 x weekly - 2 sets - 10 reps - Seated Vertical Smooth Pursuit  - 2 x daily - 7 x weekly - 2 sets - 10 reps - Seated Diagonal Smooth Pursuit  - 2 x daily - 7 x weekly - 2 sets - 10 reps - Seated Horizontal Smooth Pursuit  - 2 x daily - 7 x weekly - 2 sets - 10 reps - Seated Gaze Stabilization with Head Rotation  - 1 x daily - 7 x weekly - 3 sets - 10 reps - Seated Gaze Stabilization with Head Nod  - 1 x daily - 7 x weekly - 3 sets - 10 reps - Seated  Cervical Retraction  - 1 x daily - 7 x weekly - 2 sets - 10 reps - Seated Scapular Retraction  - 1 x daily - 7 x weekly - 2 sets - 10 reps - Seated Cervical Rotation AROM  - 1 x daily - 7 x weekly - 2 sets - 10 reps - Seated Cervical Extension AROM  - 1 x daily - 7 x weekly - 2 sets - 10 reps - Seated Upper Trapezius Stretch  - 1 x daily - 7 x weekly - 1 sets - 2 reps - 20 sec hold  GOALS: Goals reviewed with patient? Yes  SHORT TERM GOALS: Target date: 04/11/2022   Pt will be independent with initial HEP. Baseline: Goal status: MET  2.  Pt will report at least a 30% improvement in dizziness. Baseline:  Goal status: MET   LONG TERM GOALS: Target date: 05/16/2022    Pt will be independent with advanced HEP. Baseline:  Goal status: INITIAL  2.  Pt will improve Dizziness Handicapped Inventory to no greater than 50% to demonstrate pts improved functional independence. Baseline: 68/100 Goal status: INITIAL  3.  Pt will increase hip and knee strength to at least 4+/5 to allow him to perform transfers with improved ease. Baseline:  Goal status: INITIAL  4.  Pt will be able to ambulate at least 15 minutes with RPE no greater than 5/10 and without a loss of balance. Baseline: RPE of 5/10 with TUG Goal status: INITIAL   ASSESSMENT:  CLINICAL IMPRESSION: Mr Pyle presents to skilled PT reporting overall improvements with decreased dizziness with 40% improvement.  Pt did admit to some dizziness when he rolls over in bed to the right side.  Proceeded to testing with Marye Round and pt reported feeling his eyes twitching and unclear vision, so proceeded to treatment with Epley Maneuver x2.  Pt reported clearer vision following Epley Maneuver.  Patient able to progress with dynamic balance activities, such as ambulation with head turns without a loss of balance.  Pt continues to require skilled PT to progress towards goal related activities.    OBJECTIVE IMPAIRMENTS: Abnormal gait,  decreased activity tolerance, decreased balance, difficulty  walking, decreased strength, dizziness, impaired flexibility, postural dysfunction, obesity, and pain.   ACTIVITY LIMITATIONS: bending, standing, squatting, stairs, locomotion level, and caring for others  PARTICIPATION LIMITATIONS: cleaning and community activity  PERSONAL FACTORS: 3+ comorbidities: obesity, HTN, pre-diabetes, obstructive sleep apnea, cervical radiculopathy  are also affecting patient's functional outcome.   REHAB POTENTIAL: Good  CLINICAL DECISION MAKING: Evolving/moderate complexity  EVALUATION COMPLEXITY: Moderate   PLAN:  PT FREQUENCY: 2x/week  PT DURATION: 8 weeks  PLANNED INTERVENTIONS: Therapeutic exercises, Therapeutic activity, Neuromuscular re-education, Balance training, Gait training, Patient/Family education, Self Care, Joint mobilization, Joint manipulation, Stair training, Vestibular training, Canalith repositioning, Aquatic Therapy, Dry Needling, Electrical stimulation, Spinal manipulation, Spinal mobilization, Cryotherapy, Moist heat, Taping, Ultrasound, Ionotophoresis 76m/ml Dexamethasone, Manual therapy, and Re-evaluation  PLAN FOR NEXT SESSION: assess and progress HEP, vestibular rehab, [employer] and canalith repositioning as indicated   SJuel Burrow PT 04/09/2022, 1:22 PM   B[facility] SBoyntonGHeritage Village  [address]Phone # [phone] [phone]

## 2022-04-12 ENCOUNTER — Encounter: Payer: Self-pay | Admitting: Rehabilitative and Restorative Service Providers"

## 2022-04-12 ENCOUNTER — Ambulatory Visit: Payer: PPO | Admitting: Rehabilitative and Restorative Service Providers"

## 2022-04-12 DIAGNOSIS — R252 Cramp and spasm: Secondary | ICD-10-CM

## 2022-04-12 DIAGNOSIS — M542 Cervicalgia: Secondary | ICD-10-CM

## 2022-04-12 DIAGNOSIS — R42 Dizziness and giddiness: Secondary | ICD-10-CM | POA: Diagnosis not present

## 2022-04-12 DIAGNOSIS — M6281 Muscle weakness (generalized): Secondary | ICD-10-CM

## 2022-04-12 DIAGNOSIS — R2689 Other abnormalities of gait and mobility: Secondary | ICD-10-CM

## 2022-04-12 NOTE — Therapy (Signed)
OUTPATIENT PHYSICAL THERAPY VESTIBULAR TREATMENT NOTE     Patient Name: CHANANYA CANIZALEZ MRN: [id number] DOB:04-Jul-1955, 66 y.o., male Today's Date: 04/12/2022   PT End of Session - 04/12/22 0841     Visit Number 4    Date for PT Re-Evaluation 05/16/22    Authorization Type [employer]    Progress Note Due on Visit 10    PT Start Time [phone]    PT Stop Time 0921    PT Time Calculation (min) 38 min    Activity Tolerance Patient tolerated treatment well    Behavior During Therapy [facility] for tasks assessed/performed             Past Medical History:  Diagnosis Date   Arthritis    Bilateral swelling of feet    Chicken pox    Complication of anesthesia    difficulty waking up   Depression    Hypertension    Joint pain    Obesity    Osteoarthritis    Prediabetes    Shortness of breath    Swelling    feet and legs   Vitamin D deficiency    Past Surgical History:  Procedure Laterality Date   cartilage removal     COLONOSCOPY WITH PROPOFOL N/A 12/14/2019   Procedure: COLONOSCOPY WITH PROPOFOL;  Surgeon: Doran Stabler, MD;  Location: [facility];  Service: Gastroenterology;  Laterality: N/A;   KNEE SURGERY  2000   POLYPECTOMY  12/14/2019   Procedure: POLYPECTOMY;  Surgeon: Doran Stabler, MD;  Location: [facility];  Service: Gastroenterology;;   TOTAL HIP ARTHROPLASTY Left 02/11/2019   Procedure: TOTAL HIP [place];  Surgeon: Gaynelle Arabian, MD;  Location: [facility];  Service: Orthopedics;  Laterality: Left;  14mn   Patient Active Problem List   Diagnosis Date Noted   Depression 09/08/2020   Special screening for malignant neoplasms, colon    Benign neoplasm of rectum    OA (osteoarthritis) of hip 02/11/2019   Other hyperlipidemia 04/10/2017   Vitamin D deficiency 04/10/2017   Prediabetes 12/17/2016   Class 3 severe obesity with serious comorbidity and body mass index (BMI) of 50.0 to 59.9 in adult ([place] 12/17/2016   At risk for diabetes  mellitus 10/02/2016   Hyperglycemia 09/04/2016   Severe obesity (BMI >= 40) ([place] 01/15/2013   OBESITY, MORBID 01/20/2007   Essential hypertension 10/31/2006    PCP: NDorothyann Peng NP REFERRING PROVIDER: NDorothyann Peng NP  REFERRING DIAG: R42 (ICD-10-CM) - Vertigo  THERAPY DIAG:  Dizziness and giddiness  Other abnormalities of gait and mobility  Muscle weakness (generalized)  Cervicalgia  Cramp and spasm  ONSET DATE: At least a couple of months  Rationale for Evaluation and Treatment: Rehabilitation  SUBJECTIVE:   SUBJECTIVE STATEMENT: Pt reports that he had significantly less dizziness and clearer vision following treatment session for that entire day, but states that he did start having some unsteadiness the following day.  Denies current dizziness, but states unsteadiness. Pt accompanied by: self  PERTINENT HISTORY: pre-diabetes, HTN, cervical radiculopathy, OSA  PAIN:  Are you having pain? Yes: NPRS scale: 4/10 Pain location: knee Pain description: sharp Aggravating factors: activity Relieving factors: ice, medication  PRECAUTIONS: Fall  WEIGHT BEARING RESTRICTIONS: No  FALLS: Has patient fallen in last 6 months? No  LIVING ENVIRONMENT: Lives with: lives with their spouse lives with wife who is primarily w/c bound and needs assistance with most daily activities Lives in: House/apartment Stairs:  2 story home, but they live on  one level.  They have a platform lift to help get to driveway. Has following equipment at home: Single point cane and Grab bars  PLOF: Independent and Leisure: watching tv and sports  PATIENT GOALS: To be able to function again and get rid of the dizziness.  OBJECTIVE:   DIAGNOSTIC FINDINGS: Unremarkable carotid Doppler ultrasound   COGNITION: Overall cognitive status: Within functional limits for tasks assessed   SENSATION: Reports numbness and tingling down both arms   POSTURE:  rounded shoulders and forward  head  Cervical ROM:    Active A/ROM (deg) eval  Flexion 60  Extension 20  Right lateral flexion 40  Left lateral flexion 30  Right rotation 50  Left rotation 40  (Blank rows = not tested)  STRENGTH:   Eval: BUE strength is WFL Bilateral hips 4/5 Right quad/hamstring 4/5   GAIT: Gait pattern: decreased stride length, antalgic, and wide BOS Distance walked: 100 ft Assistive device utilized: Single point cane Level of assistance: Modified independence Comments: Pt with dyspnea on exertion and increased pain  FUNCTIONAL TESTS:  Eval: Timed up and go (TUG): 19.3 sec without assistive device, RPE of 5/10  04/02/2022: Dynamic Visual Acuity:  Can read 20/20 on chart normally, with fast horizontal head movements can read 20/50 (difference of 4 lines)  04/12/2022: 3 minute walk test:  412 ft with RPE of 8/10  PATIENT SURVEYS:  Eval: DHI 68 / 100 Physical Score: 18 / 28 Emotional Score: 14 / 36 Functional Score: 36 / 36  VESTIBULAR ASSESSMENT:  SYMPTOM BEHAVIOR:  Subjective history: Pt reports having dizziness for at least 2 months  Non-Vestibular symptoms: changes in hearing, changes in vision, neck pain, headaches, and nausea/vomiting  Type of dizziness: Blurred Vision, Imbalance (Disequilibrium), and Spinning/Vertigo  Frequency: daily  Duration: varies, but sometimes more than a couple of hours  Aggravating factors: Induced by motion: occur when walking, looking up at the ceiling, bending down to the ground, driving, and activity in general and "any type of exertion"  Relieving factors: head stationary and closing eyes  Progression of symptoms: unchanged  OCULOMOTOR EXAM:  Ocular Alignment: normal  Ocular ROM: No Limitations  Spontaneous Nystagmus: absent  Gaze-Induced Nystagmus: absent  Smooth Pursuits:  occasional nystagmus noted with tracking towards right side  Saccades: slow   VESTIBULAR - OCULAR REFLEX:   Positive head thrust   POSITIONAL TESTING:  Right Dix-Hallpike: no nystagmus Left Dix-Hallpike: no nystagmus    OTHOSTATICS: not done   VESTIBULAR TREATMENT:                                                                                                    DATE: 04/12/2022 Pencil pushups 2x10 Visual tracking of alphabet Cervical retraction 2x10 Scapular retraction 2x10 Dix Hallpike negative on right side Standing performing horizontal head turns x20 Amb 412 ft without assistive device with wide base of support and RPE of 8/10 Manual therapy:  Soft tissue mobilization to cervical paraspinals and upper trap Ambulation performing head turns and head nods 2x10 ft each   DATE: 04/09/2022 Marye Round positive for "eye fluttering" (  per patient) without nystagmus noted.  Proceeded to treatment with Epley Maneuver x2. Pencil pushups 2x10 Seated upper trap stretch 2x20 sec bilat Chin Tucks 2x10 Scapular retraction 2x10 Standing performing horizontal head turns x10 Ambulation performing head turns and head nods 2x10 ft each Nustep level 1 x4 min with PT present to discuss status   DATE: 04/02/2022 Pencil pushups 2x10 Visual Tracking horizontal and vertical 2x10 VOR in sitting for horizontal and vertical x1 min each with recovery periods when dizziness experienced Dynamic visual acuity with Snellen Eye Chart (difference of 4 lines) Chin Tucks 2x10 Seated cervical rotation and cervical extension x15 each Seated upper trap stretch 2x20 sec bilat   PATIENT EDUCATION: Education details: Pt issued HEP Person educated: Patient Education method: Explanation, Demonstration, and Handouts Education comprehension: verbalized understanding and returned demonstration  HOME EXERCISE PROGRAM: Access Code: [email or web address] URL: [email or web address] Date: 04/02/2022 Prepared by: Shelby Dubin Donya Hitch  Exercises - Pencil Pushups  - 2 x daily - 7 x weekly - 2 sets - 10 reps - Seated Vertical Smooth Pursuit  - 2 x daily - 7 x weekly - 2  sets - 10 reps - Seated Diagonal Smooth Pursuit  - 2 x daily - 7 x weekly - 2 sets - 10 reps - Seated Horizontal Smooth Pursuit  - 2 x daily - 7 x weekly - 2 sets - 10 reps - Seated Gaze Stabilization with Head Rotation  - 1 x daily - 7 x weekly - 3 sets - 10 reps - Seated Gaze Stabilization with Head Nod  - 1 x daily - 7 x weekly - 3 sets - 10 reps - Seated Cervical Retraction  - 1 x daily - 7 x weekly - 2 sets - 10 reps - Seated Scapular Retraction  - 1 x daily - 7 x weekly - 2 sets - 10 reps - Seated Cervical Rotation AROM  - 1 x daily - 7 x weekly - 2 sets - 10 reps - Seated Cervical Extension AROM  - 1 x daily - 7 x weekly - 2 sets - 10 reps - Seated Upper Trapezius Stretch  - 1 x daily - 7 x weekly - 1 sets - 2 reps - 20 sec hold  GOALS: Goals reviewed with patient? Yes  SHORT TERM GOALS: Target date: 04/11/2022   Pt will be independent with initial HEP. Baseline: Goal status: MET  2.  Pt will report at least a 30% improvement in dizziness. Baseline:  Goal status: MET   LONG TERM GOALS: Target date: 05/16/2022    Pt will be independent with advanced HEP. Baseline:  Goal status: IN PROGRESS  2.  Pt will improve Dizziness Handicapped Inventory to no greater than 50% to demonstrate pts improved functional independence. Baseline: 68/100 Goal status: INITIAL  3.  Pt will increase hip and knee strength to at least 4+/5 to allow him to perform transfers with improved ease. Baseline:  Goal status: INITIAL  4.  Pt will be able to ambulate at least 15 minutes with RPE no greater than 5/10 and without a loss of balance. Baseline: RPE of 5/10 with TUG Goal status: INITIAL   ASSESSMENT:  CLINICAL IMPRESSION: Mr Chopin presents to skilled PT reporting overall improvements, but still having some dizziness.  Patient able to participate in 3 minute walk test, but did have RPE of 8/10 following, requiring a seated recovery period.  Patient with negative Marye Round.  Pt with  decreased dizziness noted with vestibular exercises.  Performed soft tissue mobilization  secondary to increased pain and pt with reports of a headache today.  Pt reported decreased pain following manual therapy.  However, pt does have decreased standing tolerance during session.  Patient continues to require skilled PT to progress towards goal related activities.    OBJECTIVE IMPAIRMENTS: Abnormal gait, decreased activity tolerance, decreased balance, difficulty walking, decreased strength, dizziness, impaired flexibility, postural dysfunction, obesity, and pain.   ACTIVITY LIMITATIONS: bending, standing, squatting, stairs, locomotion level, and caring for others  PARTICIPATION LIMITATIONS: cleaning and community activity  PERSONAL FACTORS: 3+ comorbidities: obesity, HTN, pre-diabetes, obstructive sleep apnea, cervical radiculopathy  are also affecting patient's functional outcome.   REHAB POTENTIAL: Good  CLINICAL DECISION MAKING: Evolving/moderate complexity  EVALUATION COMPLEXITY: Moderate   PLAN:  PT FREQUENCY: 2x/week  PT DURATION: 8 weeks  PLANNED INTERVENTIONS: Therapeutic exercises, Therapeutic activity, Neuromuscular re-education, Balance training, Gait training, Patient/Family education, Self Care, Joint mobilization, Joint manipulation, Stair training, Vestibular training, Canalith repositioning, Aquatic Therapy, Dry Needling, Electrical stimulation, Spinal manipulation, Spinal mobilization, Cryotherapy, Moist heat, Taping, Ultrasound, Ionotophoresis 21m/ml Dexamethasone, Manual therapy, and Re-evaluation  PLAN FOR NEXT SESSION: assess and progress HEP, vestibular rehab, [employer] and canalith repositioning as indicated   Cayleigh Paull, PT 04/12/2022, 9:30 AM   BB[place] SMarmetGFelts [place] [address]Phone # [phone] [phone]

## 2022-04-16 ENCOUNTER — Encounter: Payer: Self-pay | Admitting: Rehabilitative and Restorative Service Providers"

## 2022-04-16 ENCOUNTER — Ambulatory Visit: Payer: PPO | Admitting: Rehabilitative and Restorative Service Providers"

## 2022-04-16 DIAGNOSIS — R252 Cramp and spasm: Secondary | ICD-10-CM

## 2022-04-16 DIAGNOSIS — M6281 Muscle weakness (generalized): Secondary | ICD-10-CM

## 2022-04-16 DIAGNOSIS — M542 Cervicalgia: Secondary | ICD-10-CM

## 2022-04-16 DIAGNOSIS — R2689 Other abnormalities of gait and mobility: Secondary | ICD-10-CM

## 2022-04-16 DIAGNOSIS — R42 Dizziness and giddiness: Secondary | ICD-10-CM | POA: Diagnosis not present

## 2022-04-16 NOTE — Therapy (Signed)
OUTPATIENT PHYSICAL THERAPY VESTIBULAR TREATMENT NOTE     Patient Name: James [name] MRN: [id number] DOB:1955-06-04, 66 y.o., male Today's Date: 04/16/2022   PT End of Session - 04/16/22 1241     Visit Number 5    Date for PT Re-Evaluation 05/16/22    Authorization Type [employer]    Progress Note Due on Visit 10    PT Start Time 1230    PT Stop Time 1310    PT Time Calculation (min) 40 min    Activity Tolerance Patient tolerated treatment well    Behavior During Therapy WFL for tasks assessed/performed             Past Medical History:  Diagnosis Date   Arthritis    Bilateral swelling of feet    Chicken pox    Complication of anesthesia    difficulty waking up   Depression    Hypertension    Joint pain    Obesity    Osteoarthritis    Prediabetes    Shortness of breath    Swelling    feet and legs   Vitamin D deficiency    Past Surgical History:  Procedure Laterality Date   cartilage removal     COLONOSCOPY WITH PROPOFOL N/A 12/14/2019   Procedure: COLONOSCOPY WITH PROPOFOL;  Surgeon: Doran Stabler, MD;  Location: [facility];  Service: Gastroenterology;  Laterality: N/A;   KNEE SURGERY  2000   POLYPECTOMY  12/14/2019   Procedure: POLYPECTOMY;  Surgeon: Doran Stabler, MD;  Location: [facility];  Service: Gastroenterology;;   TOTAL HIP ARTHROPLASTY Left 02/11/2019   Procedure: TOTAL HIP [place];  Surgeon: Gaynelle Arabian, MD;  Location: [facility];  Service: Orthopedics;  Laterality: Left;  185mn   Patient Active Problem List   Diagnosis Date Noted   Depression 09/08/2020   Special screening for malignant neoplasms, colon    Benign neoplasm of rectum    OA (osteoarthritis) of hip 02/11/2019   Other hyperlipidemia 04/10/2017   Vitamin D deficiency 04/10/2017   Prediabetes 12/17/2016   Class 3 severe obesity with serious comorbidity and body mass index (BMI) of 50.0 to 59.9 in adult ([facility] 12/17/2016   At risk for diabetes  mellitus 10/02/2016   Hyperglycemia 09/04/2016   Severe obesity (BMI >= 40) ([facility] 01/15/2013   OBESITY, MORBID 01/20/2007   Essential hypertension 10/31/2006    PCP: NDorothyann Peng NP REFERRING PROVIDER: NDorothyann Peng NP  REFERRING DIAG: R42 (ICD-10-CM) - Vertigo  THERAPY DIAG:  Dizziness and giddiness  Other abnormalities of gait and mobility  Muscle weakness (generalized)  Cervicalgia  Cramp and spasm  ONSET DATE: At least a couple of months  Rationale for Evaluation and Treatment: Rehabilitation  SUBJECTIVE:   SUBJECTIVE STATEMENT: Pt denies dizziness today, but states that he had some headaches and dizziness over the weekend.  Stating "hard to focus on anything".  Pt states that his wife was taken to the ED this morning. Pt accompanied by: self  PERTINENT HISTORY: pre-diabetes, HTN, cervical radiculopathy, OSA  PAIN:  Are you having pain? Yes: NPRS scale: 4-8/10 Pain location: knee Pain description: sharp Aggravating factors: activity Relieving factors: ice, medication  PRECAUTIONS: Fall  WEIGHT BEARING RESTRICTIONS: No  FALLS: Has patient fallen in last 6 months? No  LIVING ENVIRONMENT: Lives with: lives with their spouse lives with wife who is primarily w/c bound and needs assistance with most daily activities Lives in: House/apartment Stairs:  2 story home, but they live on one  level.  They have a platform lift to help get to driveway. Has following equipment at home: Single point cane and Grab bars  PLOF: Independent and Leisure: watching tv and sports  PATIENT GOALS: To be able to function again and get rid of the dizziness.  OBJECTIVE:   DIAGNOSTIC FINDINGS: Unremarkable carotid Doppler ultrasound   COGNITION: Overall cognitive status: Within functional limits for tasks assessed   SENSATION: Reports numbness and tingling down both arms   POSTURE:  rounded shoulders and forward head  Cervical ROM:    Active A/ROM (deg) eval   Flexion 60  Extension 20  Right lateral flexion 40  Left lateral flexion 30  Right rotation 50  Left rotation 40  (Blank rows = not tested)  STRENGTH:   Eval: BUE strength is WFL Bilateral hips 4/5 Right quad/hamstring 4/5   GAIT: Gait pattern: decreased stride length, antalgic, and wide BOS Distance walked: 100 ft Assistive device utilized: Single point cane Level of assistance: Modified independence Comments: Pt with dyspnea on exertion and increased pain  FUNCTIONAL TESTS:  Eval: Timed up and go (TUG): 19.3 sec without assistive device, RPE of 5/10  04/02/2022: Dynamic Visual Acuity:  Can read 20/20 on chart normally, with fast horizontal head movements can read 20/50 (difference of 4 lines)  04/12/2022: 3 minute walk test:  412 ft with RPE of 8/10  PATIENT SURVEYS:  Eval: DHI 68 / 100 Physical Score: 18 / 28 Emotional Score: 14 / 36 Functional Score: 36 / 36  VESTIBULAR ASSESSMENT:  SYMPTOM BEHAVIOR:  Subjective history: Pt reports having dizziness for at least 2 months  Non-Vestibular symptoms: changes in hearing, changes in vision, neck pain, headaches, and nausea/vomiting  Type of dizziness: Blurred Vision, Imbalance (Disequilibrium), and Spinning/Vertigo  Frequency: daily  Duration: varies, but sometimes more than a couple of hours  Aggravating factors: Induced by motion: occur when walking, looking up at the ceiling, bending down to the ground, driving, and activity in general and "any type of exertion"  Relieving factors: head stationary and closing eyes  Progression of symptoms: unchanged  OCULOMOTOR EXAM:  Ocular Alignment: normal  Ocular ROM: No Limitations  Spontaneous Nystagmus: absent  Gaze-Induced Nystagmus: absent  Smooth Pursuits:  occasional nystagmus noted with tracking towards right side  Saccades: slow   VESTIBULAR - OCULAR REFLEX:   Positive head thrust   POSITIONAL TESTING: Right Dix-Hallpike: no nystagmus Left Dix-Hallpike: no  nystagmus    OTHOSTATICS: not done   VESTIBULAR TREATMENT:                                                                                                    DATE: 04/16/2022 Nustep level 3 x4 min 45 sec with PT present to discuss status Cervical retraction 2x10 Scapular retraction 2x10 Seated shoulder ER and horizontal abduction with yellow tband 2x10 each Standing performing horizontal head turns and head nods x1 min each Manual therapy:  Soft tissue mobilization to cervical paraspinals and upper trap   DATE: 04/12/2022 Pencil pushups 2x10 Visual tracking of alphabet Cervical retraction 2x10 Scapular retraction 2x10 Dix Hallpike negative  on right side Standing performing horizontal head turns x20 Amb 412 ft without assistive device with wide base of support and RPE of 8/10 Manual therapy:  Soft tissue mobilization to cervical paraspinals and upper trap Ambulation performing head turns and head nods 2x10 ft each   DATE: 04/09/2022 Marye Round positive for "eye fluttering" (per patient) without nystagmus noted.  Proceeded to treatment with Epley Maneuver x2. Pencil pushups 2x10 Seated upper trap stretch 2x20 sec bilat Chin Tucks 2x10 Scapular retraction 2x10 Standing performing horizontal head turns x10 Ambulation performing head turns and head nods 2x10 ft each Nustep level 1 x4 min with PT present to discuss status    PATIENT EDUCATION: Education details: Pt issued HEP Person educated: Patient Education method: Explanation, Demonstration, and Handouts Education comprehension: verbalized understanding and returned demonstration  HOME EXERCISE PROGRAM: Access Code: [email or web address] URL: [email or web address] Date: 04/02/2022 Prepared by: Shelby Dubin Ramaya Guile  Exercises - Pencil Pushups  - 2 x daily - 7 x weekly - 2 sets - 10 reps - Seated Vertical Smooth Pursuit  - 2 x daily - 7 x weekly - 2 sets - 10 reps - Seated Diagonal Smooth Pursuit  - 2 x daily - 7 x  weekly - 2 sets - 10 reps - Seated Horizontal Smooth Pursuit  - 2 x daily - 7 x weekly - 2 sets - 10 reps - Seated Gaze Stabilization with Head Rotation  - 1 x daily - 7 x weekly - 3 sets - 10 reps - Seated Gaze Stabilization with Head Nod  - 1 x daily - 7 x weekly - 3 sets - 10 reps - Seated Cervical Retraction  - 1 x daily - 7 x weekly - 2 sets - 10 reps - Seated Scapular Retraction  - 1 x daily - 7 x weekly - 2 sets - 10 reps - Seated Cervical Rotation AROM  - 1 x daily - 7 x weekly - 2 sets - 10 reps - Seated Cervical Extension AROM  - 1 x daily - 7 x weekly - 2 sets - 10 reps - Seated Upper Trapezius Stretch  - 1 x daily - 7 x weekly - 1 sets - 2 reps - 20 sec hold  GOALS: Goals reviewed with patient? Yes  SHORT TERM GOALS: Target date: 04/11/2022   Pt will be independent with initial HEP. Baseline: Goal status: MET  2.  Pt will report at least a 30% improvement in dizziness. Baseline:  Goal status: MET   LONG TERM GOALS: Target date: 05/16/2022    Pt will be independent with advanced HEP. Baseline:  Goal status: IN PROGRESS  2.  Pt will improve Dizziness Handicapped Inventory to no greater than 50% to demonstrate pts improved functional independence. Baseline: 68/100 Goal status: INITIAL  3.  Pt will increase hip and knee strength to at least 4+/5 to allow him to perform transfers with improved ease. Baseline:  Goal status: INITIAL  4.  Pt will be able to ambulate at least 15 minutes with RPE no greater than 5/10 and without a loss of balance. Baseline: RPE of 5/10 with TUG Goal status: INITIAL   ASSESSMENT:  CLINICAL IMPRESSION: Mr Ericsson presents to skilled PT reporting that his wife was just taken to the ED due to a long standing cough.  Patient denies current dizziness, but does admit to having some yesterday and over the weekend.  He reports that he cannot determine a specific cause to the dizziness.  Patient with reported knee pain with  use of Nustep today,  so unable to complete a full 5 minutes.  Patient continues to require skilled PT to progress towards goal related activities.    OBJECTIVE IMPAIRMENTS: Abnormal gait, decreased activity tolerance, decreased balance, difficulty walking, decreased strength, dizziness, impaired flexibility, postural dysfunction, obesity, and pain.   ACTIVITY LIMITATIONS: bending, standing, squatting, stairs, locomotion level, and caring for others  PARTICIPATION LIMITATIONS: cleaning and community activity  PERSONAL FACTORS: 3+ comorbidities: obesity, HTN, pre-diabetes, obstructive sleep apnea, cervical radiculopathy  are also affecting patient's functional outcome.   REHAB POTENTIAL: Good  CLINICAL DECISION MAKING: Evolving/moderate complexity  EVALUATION COMPLEXITY: Moderate   PLAN:  PT FREQUENCY: 2x/week  PT DURATION: 8 weeks  PLANNED INTERVENTIONS: Therapeutic exercises, Therapeutic activity, Neuromuscular re-education, Balance training, Gait training, Patient/Family education, Self Care, Joint mobilization, Joint manipulation, Stair training, Vestibular training, Canalith repositioning, Aquatic Therapy, Dry Needling, Electrical stimulation, Spinal manipulation, Spinal mobilization, Cryotherapy, Moist heat, Taping, Ultrasound, Ionotophoresis 35m/ml Dexamethasone, Manual therapy, and Re-evaluation  PLAN FOR NEXT SESSION: assess and progress HEP, vestibular rehab, [employer] and canalith repositioning as indicated   SJuel Burrow PT, DPT 04/16/2022, 1:19 PM   [facility] [address] [place]ralGSt[address] [place] [address]Phone # [phone] [phone]

## 2022-04-18 ENCOUNTER — Encounter: Payer: Self-pay | Admitting: Adult Health

## 2022-04-19 ENCOUNTER — Ambulatory Visit: Payer: PPO | Admitting: Rehabilitative and Restorative Service Providers"

## 2022-04-19 ENCOUNTER — Other Ambulatory Visit: Payer: Self-pay | Admitting: Adult Health

## 2022-04-19 ENCOUNTER — Encounter: Payer: Self-pay | Admitting: Rehabilitative and Restorative Service Providers"

## 2022-04-19 DIAGNOSIS — R7301 Impaired fasting glucose: Secondary | ICD-10-CM

## 2022-04-19 DIAGNOSIS — R2689 Other abnormalities of gait and mobility: Secondary | ICD-10-CM

## 2022-04-19 DIAGNOSIS — M6281 Muscle weakness (generalized): Secondary | ICD-10-CM

## 2022-04-19 DIAGNOSIS — R42 Dizziness and giddiness: Secondary | ICD-10-CM | POA: Diagnosis not present

## 2022-04-19 DIAGNOSIS — R252 Cramp and spasm: Secondary | ICD-10-CM

## 2022-04-19 DIAGNOSIS — M542 Cervicalgia: Secondary | ICD-10-CM

## 2022-04-19 DIAGNOSIS — R7303 Prediabetes: Secondary | ICD-10-CM

## 2022-04-19 MED ORDER — SEMAGLUTIDE(0.25 OR 0.5MG/DOS) 2 MG/3ML ~~LOC~~ SOPN: 0.5000 mg | PEN_INJECTOR | SUBCUTANEOUS | 1 refills | Status: AC

## 2022-04-19 NOTE — Patient Instructions (Signed)

## 2022-04-19 NOTE — Telephone Encounter (Signed)
Please advise 

## 2022-04-19 NOTE — Therapy (Signed)
OUTPATIENT PHYSICAL THERAPY VESTIBULAR TREATMENT NOTE     Patient Name: James [name] MRN: [id number] DOB:08-29-1955, 66 y.o., male Today's Date: 04/19/2022   PT End of Session - 04/19/22 0800     Visit Number 6    Date for PT Re-Evaluation 05/16/22    Authorization Type [employer]    Progress Note Due on Visit 10    PT Start Time 0800    PT Stop Time 0840    PT Time Calculation (min) 40 min    Activity Tolerance Patient tolerated treatment well    Behavior During Therapy [facility] for tasks assessed/performed             Past Medical History:  Diagnosis Date   Arthritis    Bilateral swelling of feet    Chicken pox    Complication of anesthesia    difficulty waking up   Depression    Hypertension    Joint pain    Obesity    Osteoarthritis    Prediabetes    Shortness of breath    Swelling    feet and legs   Vitamin D deficiency    Past Surgical History:  Procedure Laterality Date   cartilage removal     COLONOSCOPY WITH PROPOFOL N/A 12/14/2019   Procedure: COLONOSCOPY WITH PROPOFOL;  Surgeon: Doran Stabler, MD;  Location: [facility];  Service: Gastroenterology;  Laterality: N/A;   KNEE SURGERY  2000   POLYPECTOMY  12/14/2019   Procedure: POLYPECTOMY;  Surgeon: Doran Stabler, MD;  Location: [facility];  Service: Gastroenterology;;   TOTAL HIP ARTHROPLASTY Left 02/11/2019   Procedure: TOTAL HIP [place];  Surgeon: Gaynelle Arabian, MD;  Location: [facility];  Service: Orthopedics;  Laterality: Left;  195mn   Patient Active Problem List   Diagnosis Date Noted   Depression 09/08/2020   Special screening for malignant neoplasms, colon    Benign neoplasm of rectum    OA (osteoarthritis) of hip 02/11/2019   Other hyperlipidemia 04/10/2017   Vitamin D deficiency 04/10/2017   Prediabetes 12/17/2016   Class 3 severe obesity with serious comorbidity and body mass index (BMI) of 50.0 to 59.9 in adult ([facility] 12/17/2016   At risk for diabetes  mellitus 10/02/2016   Hyperglycemia 09/04/2016   Severe obesity (BMI >= 40) ([facility] 01/15/2013   OBESITY, MORBID 01/20/2007   Essential hypertension 10/31/2006    PCP: NDorothyann Peng NP REFERRING PROVIDER: NDorothyann Peng NP  REFERRING DIAG: R42 (ICD-10-CM) - Vertigo  THERAPY DIAG:  Dizziness and giddiness  Other abnormalities of gait and mobility  Muscle weakness (generalized)  Cervicalgia  Cramp and spasm  ONSET DATE: At least a couple of months  Rationale for Evaluation and Treatment: Rehabilitation  SUBJECTIVE:   SUBJECTIVE STATEMENT: Pt states that his wife is home from the hospital, but he had to sit with her for prolonged periods in the ED.  Pt states that he has not been as dizzy recently. Pt accompanied by: self  PERTINENT HISTORY: pre-diabetes, HTN, cervical radiculopathy, OSA  PAIN:  Are you having pain? Yes: NPRS scale: 0-4/10 Pain location: knee, back and shoulders Pain description: sharp Aggravating factors: activity Relieving factors: ice, medication  PRECAUTIONS: Fall  WEIGHT BEARING RESTRICTIONS: No  FALLS: Has patient fallen in last 6 months? No  LIVING ENVIRONMENT: Lives with: lives with their spouse lives with wife who is primarily w/c bound and needs assistance with most daily activities Lives in: House/apartment Stairs:  2 story home, but they live on  one level.  They have a platform lift to help get to driveway. Has following equipment at home: Single point cane and Grab bars  PLOF: Independent and Leisure: watching tv and sports  PATIENT GOALS: To be able to function again and get rid of the dizziness.  OBJECTIVE:   DIAGNOSTIC FINDINGS: Unremarkable carotid Doppler ultrasound   COGNITION: Overall cognitive status: Within functional limits for tasks assessed   SENSATION: Reports numbness and tingling down both arms   POSTURE:  rounded shoulders and forward head  Cervical ROM:    Active A/ROM (deg) eval  Flexion 60   Extension 20  Right lateral flexion 40  Left lateral flexion 30  Right rotation 50  Left rotation 40  (Blank rows = not tested)  STRENGTH:   Eval: BUE strength is WFL Bilateral hips 4/5 Right quad/hamstring 4/5   GAIT: Gait pattern: decreased stride length, antalgic, and wide BOS Distance walked: 100 ft Assistive device utilized: Single point cane Level of assistance: Modified independence Comments: Pt with dyspnea on exertion and increased pain  FUNCTIONAL TESTS:  Eval: Timed up and go (TUG): 19.3 sec without assistive device, RPE of 5/10  04/02/2022: Dynamic Visual Acuity:  Can read 20/20 on chart normally, with fast horizontal head movements can read 20/50 (difference of 4 lines)  04/12/2022: 3 minute walk test:  412 ft with RPE of 8/10  PATIENT SURVEYS:  Eval: DHI 68 / 100 Physical Score: 18 / 28 Emotional Score: 14 / 36 Functional Score: 36 / 36  VESTIBULAR ASSESSMENT:  SYMPTOM BEHAVIOR:  Subjective history: Pt reports having dizziness for at least 2 months  Non-Vestibular symptoms: changes in hearing, changes in vision, neck pain, headaches, and nausea/vomiting  Type of dizziness: Blurred Vision, Imbalance (Disequilibrium), and Spinning/Vertigo  Frequency: daily  Duration: varies, but sometimes more than a couple of hours  Aggravating factors: Induced by motion: occur when walking, looking up at the ceiling, bending down to the ground, driving, and activity in general and "any type of exertion"  Relieving factors: head stationary and closing eyes  Progression of symptoms: unchanged  OCULOMOTOR EXAM:  Ocular Alignment: normal  Ocular ROM: No Limitations  Spontaneous Nystagmus: absent  Gaze-Induced Nystagmus: absent  Smooth Pursuits:  occasional nystagmus noted with tracking towards right side  Saccades: slow   VESTIBULAR - OCULAR REFLEX:   Positive head thrust   POSITIONAL TESTING: Right Dix-Hallpike: no nystagmus Left Dix-Hallpike: no  nystagmus    OTHOSTATICS: not done   VESTIBULAR TREATMENT:                                                                                                    DATE: 04/19/2022 Cervical retraction 2x10 Scapular retraction 2x10 Seated shoulder ER and horizontal abduction with yellow tband 2x10 each Standing performing horizontal head turns and head nods x1 min each Side stepping down length of parallel bars and back x3 laps bilat Trigger Point Dry-Needling  Treatment instructions: Expect mild to moderate muscle soreness. S/S of pneumothorax if dry needled over a lung field, and to seek immediate medical attention should they occur. Patient  verbalized understanding of these instructions and education. Patient Consent Given: Yes Education handout provided: Yes Muscles treated: bilateral upper traps Electrical stimulation performed: No Parameters: N/A Treatment response/outcome: Utilized skilled palpation to locate trigger points.  Able to illicit muscle twitch and muscle elongation following Nustep level 1 x4 min with PT present to discuss status   DATE: 04/16/2022 Nustep level 3 x4 min 45 sec with PT present to discuss status Cervical retraction 2x10 Scapular retraction 2x10 Seated shoulder ER and horizontal abduction with yellow tband 2x10 each Standing performing horizontal head turns and head nods x1 min each Manual therapy:  Soft tissue mobilization to cervical paraspinals and upper trap   DATE: 04/12/2022 Pencil pushups 2x10 Visual tracking of alphabet Cervical retraction 2x10 Scapular retraction 2x10 Dix Hallpike negative on right side Standing performing horizontal head turns x20 Amb 412 ft without assistive device with wide base of support and RPE of 8/10 Manual therapy:  Soft tissue mobilization to cervical paraspinals and upper trap Ambulation performing head turns and head nods 2x10 ft each    PATIENT EDUCATION: Education details: Pt issued HEP Person educated:  Patient Education method: Explanation, Demonstration, and Handouts Education comprehension: verbalized understanding and returned demonstration  HOME EXERCISE PROGRAM: Access Code: [email or web address] URL: [email or web address] Date: 04/02/2022 Prepared by: Shelby Dubin Darnella Zeiter  Exercises - Pencil Pushups  - 2 x daily - 7 x weekly - 2 sets - 10 reps - Seated Vertical Smooth Pursuit  - 2 x daily - 7 x weekly - 2 sets - 10 reps - Seated Diagonal Smooth Pursuit  - 2 x daily - 7 x weekly - 2 sets - 10 reps - Seated Horizontal Smooth Pursuit  - 2 x daily - 7 x weekly - 2 sets - 10 reps - Seated Gaze Stabilization with Head Rotation  - 1 x daily - 7 x weekly - 3 sets - 10 reps - Seated Gaze Stabilization with Head Nod  - 1 x daily - 7 x weekly - 3 sets - 10 reps - Seated Cervical Retraction  - 1 x daily - 7 x weekly - 2 sets - 10 reps - Seated Scapular Retraction  - 1 x daily - 7 x weekly - 2 sets - 10 reps - Seated Cervical Rotation AROM  - 1 x daily - 7 x weekly - 2 sets - 10 reps - Seated Cervical Extension AROM  - 1 x daily - 7 x weekly - 2 sets - 10 reps - Seated Upper Trapezius Stretch  - 1 x daily - 7 x weekly - 1 sets - 2 reps - 20 sec hold  GOALS: Goals reviewed with patient? Yes  SHORT TERM GOALS: Target date: 04/11/2022   Pt will be independent with initial HEP. Baseline: Goal status: MET  2.  Pt will report at least a 30% improvement in dizziness. Baseline:  Goal status: MET   LONG TERM GOALS: Target date: 05/16/2022    Pt will be independent with advanced HEP. Baseline:  Goal status: IN PROGRESS  2.  Pt will improve Dizziness Handicapped Inventory to no greater than 50% to demonstrate pts improved functional independence. Baseline: 68/100 Goal status: INITIAL  3.  Pt will increase hip and knee strength to at least 4+/5 to allow him to perform transfers with improved ease. Baseline:  Goal status: INITIAL  4.  Pt will be able to ambulate at least 15 minutes with RPE  no greater than 5/10 and without a loss of balance. Baseline: RPE of 5/10 with  TUG Goal status: INITIAL   ASSESSMENT:  CLINICAL IMPRESSION: Mr Altergott presents to skilled PT reporting that his dizziness seems to be improved, but he is not sure if that is because he is better or that he has been so preoccupied with his wife.  Pt agreeable to trying dry needling today secondary to him having tightness in his shoulders and with his numbness and tingling in his arms.  Patient reported feeling looser following dry needling and states decreased pain with cervical rotation following dry needling.  Patient able to tolerate NuStep easier today with it being at level 1 and limiting to only 4 minutes.  Patient continues to require skilled PT to progress towards goal related activities.    OBJECTIVE IMPAIRMENTS: Abnormal gait, decreased activity tolerance, decreased balance, difficulty walking, decreased strength, dizziness, impaired flexibility, postural dysfunction, obesity, and pain.   ACTIVITY LIMITATIONS: bending, standing, squatting, stairs, locomotion level, and caring for others  PARTICIPATION LIMITATIONS: cleaning and community activity  PERSONAL FACTORS: 3+ comorbidities: obesity, HTN, pre-diabetes, obstructive sleep apnea, cervical radiculopathy  are also affecting patient's functional outcome.   REHAB POTENTIAL: Good  CLINICAL DECISION MAKING: Evolving/moderate complexity  EVALUATION COMPLEXITY: Moderate   PLAN:  PT FREQUENCY: 2x/week  PT DURATION: 8 weeks  PLANNED INTERVENTIONS: Therapeutic exercises, Therapeutic activity, Neuromuscular re-education, Balance training, Gait training, Patient/Family education, Self Care, Joint mobilization, Joint manipulation, Stair training, Vestibular training, Canalith repositioning, Aquatic Therapy, Dry Needling, Electrical stimulation, Spinal manipulation, Spinal mobilization, Cryotherapy, Moist heat, Taping, Ultrasound, Ionotophoresis 5m/ml  Dexamethasone, Manual therapy, and Re-evaluation  PLAN FOR NEXT SESSION: assess and progress HEP, vestibular rehab, [employer] and canalith repositioning as indicated   SJuel Burrow PT, DPT 04/19/2022, 8:47 AM   [employer] [place] [address]Phone # [phone] [phone]

## 2022-04-23 ENCOUNTER — Ambulatory Visit: Payer: PPO | Admitting: Rehabilitative and Restorative Service Providers"

## 2022-04-23 ENCOUNTER — Encounter: Payer: Self-pay | Admitting: Rehabilitative and Restorative Service Providers"

## 2022-04-23 DIAGNOSIS — R252 Cramp and spasm: Secondary | ICD-10-CM

## 2022-04-23 DIAGNOSIS — R42 Dizziness and giddiness: Secondary | ICD-10-CM

## 2022-04-23 DIAGNOSIS — R2689 Other abnormalities of gait and mobility: Secondary | ICD-10-CM

## 2022-04-23 DIAGNOSIS — M6281 Muscle weakness (generalized): Secondary | ICD-10-CM

## 2022-04-23 DIAGNOSIS — M542 Cervicalgia: Secondary | ICD-10-CM

## 2022-04-23 NOTE — Therapy (Signed)
OUTPATIENT PHYSICAL THERAPY VESTIBULAR TREATMENT NOTE     Patient Name: James [name] MRN: [id number] DOB:Dec 28, 1955, 66 y.o., male Today's Date: 04/23/2022   PT End of Session - 04/23/22 1148     Visit Number 7    Date for PT Re-Evaluation 05/16/22    Authorization Type [employer]    Progress Note Due on Visit 10    PT Start Time 1145    PT Stop Time 1225    PT Time Calculation (min) 40 min    Activity Tolerance Patient tolerated treatment well    Behavior During Therapy WFL for tasks assessed/performed             Past Medical History:  Diagnosis Date   Arthritis    Bilateral swelling of feet    Chicken pox    Complication of anesthesia    difficulty waking up   Depression    Hypertension    Joint pain    Obesity    Osteoarthritis    Prediabetes    Shortness of breath    Swelling    feet and legs   Vitamin D deficiency    Past Surgical History:  Procedure Laterality Date   cartilage removal     COLONOSCOPY WITH PROPOFOL N/A 12/14/2019   Procedure: COLONOSCOPY WITH PROPOFOL;  Surgeon: Doran Stabler, MD;  Location: [facility];  Service: Gastroenterology;  Laterality: N/A;   KNEE SURGERY  2000   POLYPECTOMY  12/14/2019   Procedure: POLYPECTOMY;  Surgeon: Doran Stabler, MD;  Location: [facility];  Service: Gastroenterology;;   TOTAL HIP ARTHROPLASTY Left 02/11/2019   Procedure: TOTAL HIP [place];  Surgeon: Gaynelle Arabian, MD;  Location: [facility];  Service: Orthopedics;  Laterality: Left;  125mn   Patient Active Problem List   Diagnosis Date Noted   Depression 09/08/2020   Special screening for malignant neoplasms, colon    Benign neoplasm of rectum    OA (osteoarthritis) of hip 02/11/2019   Other hyperlipidemia 04/10/2017   Vitamin D deficiency 04/10/2017   Prediabetes 12/17/2016   Class 3 severe obesity with serious comorbidity and body mass index (BMI) of 50.0 to 59.9 in adult ([place] 12/17/2016   At risk for diabetes  mellitus 10/02/2016   Hyperglycemia 09/04/2016   Severe obesity (BMI >= 40) [place] 01/15/2013   OBESITY, MORBID 01/20/2007   Essential hypertension 10/31/2006    PCP: NDorothyann Peng NP REFERRING PROVIDER: NDorothyann Peng NP  REFERRING DIAG: R42 (ICD-10-CM) - Vertigo  THERAPY DIAG:  Dizziness and giddiness  Other abnormalities of gait and mobility  Muscle weakness (generalized)  Cervicalgia  Cramp and spasm  ONSET DATE: At least a couple of months  Rationale for Evaluation and Treatment: Rehabilitation  SUBJECTIVE:   SUBJECTIVE STATEMENT: Pt reports that he thinks that the dry needling did help with his pain.  States that he has not been sleeping as well with his wife's cough from being sick. Pt reports dizziness feels "at least 50%" better.  "The biggest thing is the numbness and tingling."  Pt accompanied by: self  PERTINENT HISTORY: pre-diabetes, HTN, cervical radiculopathy, OSA  PAIN:  Are you having pain? Yes: NPRS scale: 0-4/10 Pain location: knee, back and shoulders Pain description: sharp Aggravating factors: activity Relieving factors: ice, medication  PRECAUTIONS: Fall  WEIGHT BEARING RESTRICTIONS: No  FALLS: Has patient fallen in last 6 months? No  LIVING ENVIRONMENT: Lives with: lives with their spouse lives with wife who is primarily w/c bound and needs assistance with  most daily activities Lives in: House/apartment Stairs:  2 story home, but they live on one level.  They have a platform lift to help get to driveway. Has following equipment at home: Single point cane and Grab bars  PLOF: Independent and Leisure: watching tv and sports  PATIENT GOALS: To be able to function again and get rid of the dizziness.  OBJECTIVE:   DIAGNOSTIC FINDINGS: Unremarkable carotid Doppler ultrasound   COGNITION: Overall cognitive status: Within functional limits for tasks assessed   SENSATION: Reports numbness and tingling down both arms   POSTURE:   rounded shoulders and forward head  Cervical ROM:    Active A/ROM (deg) eval  Flexion 60  Extension 20  Right lateral flexion 40  Left lateral flexion 30  Right rotation 50  Left rotation 40  (Blank rows = not tested)  STRENGTH:   Eval: BUE strength is WFL Bilateral hips 4/5 Right quad/hamstring 4/5   GAIT: Gait pattern: decreased stride length, antalgic, and wide BOS Distance walked: 100 ft Assistive device utilized: Single point cane Level of assistance: Modified independence Comments: Pt with dyspnea on exertion and increased pain  FUNCTIONAL TESTS:  Eval: Timed up and go (TUG): 19.3 sec without assistive device, RPE of 5/10  04/02/2022: Dynamic Visual Acuity:  Can read 20/20 on chart normally, with fast horizontal head movements can read 20/50 (difference of 4 lines)  04/12/2022: 3 minute walk test:  412 ft with RPE of 8/10  PATIENT SURVEYS:  Eval: DHI 68 / 100 Physical Score: 18 / 28 Emotional Score: 14 / 36 Functional Score: 36 / 36  04/23/2022: DHI Total Score: 64 / 100 Physical Score: 18 / 28 Emotional Score: 12 / 36 Functional Score: 34 / 36  VESTIBULAR ASSESSMENT:  SYMPTOM BEHAVIOR:  Subjective history: Pt reports having dizziness for at least 2 months  Non-Vestibular symptoms: changes in hearing, changes in vision, neck pain, headaches, and nausea/vomiting  Type of dizziness: Blurred Vision, Imbalance (Disequilibrium), and Spinning/Vertigo  Frequency: daily  Duration: varies, but sometimes more than a couple of hours  Aggravating factors: Induced by motion: occur when walking, looking up at the ceiling, bending down to the ground, driving, and activity in general and "any type of exertion"  Relieving factors: head stationary and closing eyes  Progression of symptoms: unchanged  OCULOMOTOR EXAM:  Ocular Alignment: normal  Ocular ROM: No Limitations  Spontaneous Nystagmus: absent  Gaze-Induced Nystagmus: absent  Smooth Pursuits:   occasional nystagmus noted with tracking towards right side  Saccades: slow   VESTIBULAR - OCULAR REFLEX:   Positive head thrust   POSITIONAL TESTING: Right Dix-Hallpike: no nystagmus Left Dix-Hallpike: no nystagmus    OTHOSTATICS: not done   VESTIBULAR TREATMENT:                                                                                                    DATE: 04/23/2022 Nustep level 2 x5 min with PT present to discuss status Cervical retraction 2x10 Scapular retraction 2x10 Dix Hallpike negative bilat Seated shoulder ER and horizontal abduction with yellow tband 2x10 each Trigger Point  Dry-Needling  Treatment instructions: Expect mild to moderate muscle soreness. S/S of pneumothorax if dry needled over a lung field, and to seek immediate medical attention should they occur. Patient verbalized understanding of these instructions and education. Patient Consent Given: Yes Education handout provided: Yes Muscles treated: bilateral upper traps Electrical stimulation performed: No Parameters: N/A Treatment response/outcome: Utilized skilled palpation to locate trigger points.  Able to illicit muscle twitch and muscle elongation following Manual therapy:  Soft tissue mobilization to cervical paraspinals and upper trap Standing performing horizontal head turns and head nods x1 min each   DATE: 04/19/2022 Cervical retraction 2x10 Scapular retraction 2x10 Seated shoulder ER and horizontal abduction with yellow tband 2x10 each Standing performing horizontal head turns and head nods x1 min each Side stepping down length of parallel bars and back x3 laps bilat Trigger Point Dry-Needling  Treatment instructions: Expect mild to moderate muscle soreness. S/S of pneumothorax if dry needled over a lung field, and to seek immediate medical attention should they occur. Patient verbalized understanding of these instructions and education. Patient Consent Given: Yes Education handout  provided: Yes Muscles treated: bilateral upper traps Electrical stimulation performed: No Parameters: N/A Treatment response/outcome: Utilized skilled palpation to locate trigger points.  Able to illicit muscle twitch and muscle elongation following Nustep level 1 x4 min with PT present to discuss status   DATE: 04/16/2022 Nustep level 3 x4 min 45 sec with PT present to discuss status Cervical retraction 2x10 Scapular retraction 2x10 Seated shoulder ER and horizontal abduction with yellow tband 2x10 each Standing performing horizontal head turns and head nods x1 min each Manual therapy:  Soft tissue mobilization to cervical paraspinals and upper trap   DATE: 04/12/2022 Pencil pushups 2x10 Visual tracking of alphabet Cervical retraction 2x10 Scapular retraction 2x10 Dix Hallpike negative on right side Standing performing horizontal head turns x20 Amb 412 ft without assistive device with wide base of support and RPE of 8/10 Manual therapy:  Soft tissue mobilization to cervical paraspinals and upper trap Ambulation performing head turns and head nods 2x10 ft each    PATIENT EDUCATION: Education details: Pt issued HEP Person educated: Patient Education method: Explanation, Demonstration, and Handouts Education comprehension: verbalized understanding and returned demonstration  HOME EXERCISE PROGRAM: Access Code: [email or web address] URL: [email or web address] Date: 04/02/2022 Prepared by: Shelby Dubin Walda Hertzog  Exercises - Pencil Pushups  - 2 x daily - 7 x weekly - 2 sets - 10 reps - Seated Vertical Smooth Pursuit  - 2 x daily - 7 x weekly - 2 sets - 10 reps - Seated Diagonal Smooth Pursuit  - 2 x daily - 7 x weekly - 2 sets - 10 reps - Seated Horizontal Smooth Pursuit  - 2 x daily - 7 x weekly - 2 sets - 10 reps - Seated Gaze Stabilization with Head Rotation  - 1 x daily - 7 x weekly - 3 sets - 10 reps - Seated Gaze Stabilization with Head Nod  - 1 x daily - 7 x weekly - 3 sets  - 10 reps - Seated Cervical Retraction  - 1 x daily - 7 x weekly - 2 sets - 10 reps - Seated Scapular Retraction  - 1 x daily - 7 x weekly - 2 sets - 10 reps - Seated Cervical Rotation AROM  - 1 x daily - 7 x weekly - 2 sets - 10 reps - Seated Cervical Extension AROM  - 1 x daily - 7 x weekly - 2 sets - 10 reps - Seated  Upper Trapezius Stretch  - 1 x daily - 7 x weekly - 1 sets - 2 reps - 20 sec hold  GOALS: Goals reviewed with patient? Yes  SHORT TERM GOALS: Target date: 04/11/2022   Pt will be independent with initial HEP. Baseline: Goal status: MET  2.  Pt will report at least a 30% improvement in dizziness. Baseline:  Goal status: MET   LONG TERM GOALS: Target date: 05/16/2022    Pt will be independent with advanced HEP. Baseline:  Goal status: IN PROGRESS  2.  Pt will improve Dizziness Handicapped Inventory to no greater than 50% to demonstrate pts improved functional independence. Baseline: 68/100 Goal status: IN PROGRESS  3.  Pt will increase hip and knee strength to at least 4+/5 to allow him to perform transfers with improved ease. Baseline:  Goal status: INITIAL  4.  Pt will be able to ambulate at least 15 minutes with RPE no greater than 5/10 and without a loss of balance. Baseline: RPE of 5/10 with TUG Goal status: INITIAL   ASSESSMENT:  CLINICAL IMPRESSION: Mr Weightman presents to skilled PT with reporting dizziness has improved and he only intermittently has dizziness.  Patient was able to tolerate NuStep longer today without complaints of as much pain or fatigue.  Patient continues to report looseness following dry needling and manual with reports that last time it seemed to help some of his numbness and tingling down his arms.  Patient continues to require skilled PT to progress towards goal related activities.    OBJECTIVE IMPAIRMENTS: Abnormal gait, decreased activity tolerance, decreased balance, difficulty walking, decreased strength, dizziness,  impaired flexibility, postural dysfunction, obesity, and pain.   ACTIVITY LIMITATIONS: bending, standing, squatting, stairs, locomotion level, and caring for others  PARTICIPATION LIMITATIONS: cleaning and community activity  PERSONAL FACTORS: 3+ comorbidities: obesity, HTN, pre-diabetes, obstructive sleep apnea, cervical radiculopathy  are also affecting patient's functional outcome.   REHAB POTENTIAL: Good  CLINICAL DECISION MAKING: Evolving/moderate complexity  EVALUATION COMPLEXITY: Moderate   PLAN:  PT FREQUENCY: 2x/week  PT DURATION: 8 weeks  PLANNED INTERVENTIONS: Therapeutic exercises, Therapeutic activity, Neuromuscular re-education, Balance training, Gait training, Patient/Family education, Self Care, Joint mobilization, Joint manipulation, Stair training, Vestibular training, Canalith repositioning, Aquatic Therapy, Dry Needling, Electrical stimulation, Spinal manipulation, Spinal mobilization, Cryotherapy, Moist heat, Taping, Ultrasound, Ionotophoresis 9m/ml Dexamethasone, Manual therapy, and Re-evaluation  PLAN FOR NEXT SESSION: assess and progress HEP, vestibular rehab, [employer] and canalith repositioning as indicated   SJuel Burrow PT, DPT 04/23/2022, 1:19 PM   [facility]Un[facility] CenterSpecial[facility] [address] SFort Belknap AgencyGLemitar Tucker [id number] # [phone] [phone]

## 2022-04-26 ENCOUNTER — Ambulatory Visit: Payer: PPO | Admitting: Rehabilitative and Restorative Service Providers"

## 2022-05-09 ENCOUNTER — Other Ambulatory Visit: Payer: Self-pay | Admitting: Adult Health

## 2022-05-09 ENCOUNTER — Ambulatory Visit: Payer: HMO | Attending: Adult Health | Admitting: Rehabilitative and Restorative Service Providers"

## 2022-05-09 ENCOUNTER — Encounter: Payer: Self-pay | Admitting: Rehabilitative and Restorative Service Providers"

## 2022-05-09 DIAGNOSIS — R42 Dizziness and giddiness: Secondary | ICD-10-CM | POA: Insufficient documentation

## 2022-05-09 DIAGNOSIS — M6281 Muscle weakness (generalized): Secondary | ICD-10-CM | POA: Diagnosis not present

## 2022-05-09 DIAGNOSIS — M542 Cervicalgia: Secondary | ICD-10-CM

## 2022-05-09 DIAGNOSIS — R2689 Other abnormalities of gait and mobility: Secondary | ICD-10-CM

## 2022-05-09 DIAGNOSIS — I10 Essential (primary) hypertension: Secondary | ICD-10-CM

## 2022-05-09 DIAGNOSIS — R252 Cramp and spasm: Secondary | ICD-10-CM | POA: Insufficient documentation

## 2022-05-09 NOTE — Therapy (Signed)
OUTPATIENT PHYSICAL THERAPY VESTIBULAR TREATMENT NOTE     Patient Name: BRITTNEY CARAWAY MRN: [id number] DOB:04/15/1956, 67 y.o., male Today's Date: 05/09/2022   PT End of Session - 05/09/22 0934     Visit Number 8    Date for PT Re-Evaluation 05/16/22    Authorization Type [employer]    Progress Note Due on Visit 10    PT Start Time 0930    PT Stop Time 1010    PT Time Calculation (min) 40 min    Activity Tolerance Patient tolerated treatment well    Behavior During Therapy WFL for tasks assessed/performed             Past Medical History:  Diagnosis Date   Arthritis    Bilateral swelling of feet    Chicken pox    Complication of anesthesia    difficulty waking up   Depression    Hypertension    Joint pain    Obesity    Osteoarthritis    Prediabetes    Shortness of breath    Swelling    feet and legs   Vitamin D deficiency    Past Surgical History:  Procedure Laterality Date   cartilage removal     COLONOSCOPY WITH PROPOFOL N/A 12/14/2019   Procedure: COLONOSCOPY WITH PROPOFOL;  Surgeon: Doran Stabler, MD;  Location: [facility];  Service: Gastroenterology;  Laterality: N/A;   KNEE SURGERY  2000   POLYPECTOMY  12/14/2019   Procedure: POLYPECTOMY;  Surgeon: Doran Stabler, MD;  Location: [facility];  Service: Gastroenterology;;   TOTAL HIP ARTHROPLASTY Left 02/11/2019   Procedure: TOTAL HIP [place];  Surgeon: Gaynelle Arabian, MD;  Location: [facility];  Service: Orthopedics;  Laterality: Left;  123mn   Patient Active Problem List   Diagnosis Date Noted   Depression 09/08/2020   Special screening for malignant neoplasms, colon    Benign neoplasm of rectum    OA (osteoarthritis) of hip 02/11/2019   Other hyperlipidemia 04/10/2017   Vitamin D deficiency 04/10/2017   Prediabetes 12/17/2016   Class 3 severe obesity with serious comorbidity and body mass index (BMI) of 50.0 to 59.9 in adult [place] 12/17/2016   At risk for diabetes  mellitus 10/02/2016   Hyperglycemia 09/04/2016   Severe obesity (BMI >= 40) ([place] 01/15/2013   OBESITY, MORBID 01/20/2007   Essential hypertension 10/31/2006    PCP: NDorothyann Peng NP REFERRING PROVIDER: NDorothyann Peng NP  REFERRING DIAG: R42 (ICD-10-CM) - Vertigo  THERAPY DIAG:  Dizziness and giddiness  Other abnormalities of gait and mobility  Muscle weakness (generalized)  Cervicalgia  Cramp and spasm  ONSET DATE: At least a couple of months  Rationale for Evaluation and Treatment: Rehabilitation  SUBJECTIVE:   SUBJECTIVE STATEMENT: Pt reports that he had to cancel last visit due to starting Ozempic and having some bad side effects.  Pt states that he is at times unsteady, but he has not been dizzy.  States that the numbness has decreased.  Pt accompanied by: self  PERTINENT HISTORY: pre-diabetes, HTN, cervical radiculopathy, OSA  PAIN:  Are you having pain? Yes: NPRS scale: 0-4/10 Pain location: knee, back and shoulders Pain description: sharp Aggravating factors: activity Relieving factors: ice, medication  PRECAUTIONS: Fall  WEIGHT BEARING RESTRICTIONS: No  FALLS: Has patient fallen in last 6 months? No  LIVING ENVIRONMENT: Lives with: lives with their spouse lives with wife who is primarily w/c bound and needs assistance with most daily activities Lives in: House/apartment Stairs:  2 story home, but they live on one level.  They have a platform lift to help get to driveway. Has following equipment at home: Single point cane and Grab bars  PLOF: Independent and Leisure: watching tv and sports  PATIENT GOALS: To be able to function again and get rid of the dizziness.  OBJECTIVE:   DIAGNOSTIC FINDINGS: Unremarkable carotid Doppler ultrasound   COGNITION: Overall cognitive status: Within functional limits for tasks assessed   SENSATION: Reports numbness and tingling down both arms   POSTURE:  rounded shoulders and forward head  Cervical  ROM:    Active A/ROM (deg) eval  Flexion 60  Extension 20  Right lateral flexion 40  Left lateral flexion 30  Right rotation 50  Left rotation 40  (Blank rows = not tested)  STRENGTH:   Eval: BUE strength is WFL Bilateral hips 4/5 Right quad/hamstring 4/5   GAIT: Gait pattern: decreased stride length, antalgic, and wide BOS Distance walked: 100 ft Assistive device utilized: Single point cane Level of assistance: Modified independence Comments: Pt with dyspnea on exertion and increased pain  FUNCTIONAL TESTS:  Eval: Timed up and go (TUG): 19.3 sec without assistive device, RPE of 5/10  04/02/2022: Dynamic Visual Acuity:  Can read 20/20 on chart normally, with fast horizontal head movements can read 20/50 (difference of 4 lines)  04/12/2022: 3 minute walk test:  412 ft with RPE of 8/10  PATIENT SURVEYS:  Eval: DHI 68 / 100 Physical Score: 18 / 28 Emotional Score: 14 / 36 Functional Score: 36 / 36  04/23/2022: DHI Total Score: 64 / 100 Physical Score: 18 / 28 Emotional Score: 12 / 36 Functional Score: 34 / 36  VESTIBULAR ASSESSMENT:  SYMPTOM BEHAVIOR:  Subjective history: Pt reports having dizziness for at least 2 months  Non-Vestibular symptoms: changes in hearing, changes in vision, neck pain, headaches, and nausea/vomiting  Type of dizziness: Blurred Vision, Imbalance (Disequilibrium), and Spinning/Vertigo  Frequency: daily  Duration: varies, but sometimes more than a couple of hours  Aggravating factors: Induced by motion: occur when walking, looking up at the ceiling, bending down to the ground, driving, and activity in general and "any type of exertion"  Relieving factors: head stationary and closing eyes  Progression of symptoms: unchanged  OCULOMOTOR EXAM:  Ocular Alignment: normal  Ocular ROM: No Limitations  Spontaneous Nystagmus: absent  Gaze-Induced Nystagmus: absent  Smooth Pursuits:  occasional nystagmus noted with tracking towards right  side  Saccades: slow   VESTIBULAR - OCULAR REFLEX:   Positive head thrust   POSITIONAL TESTING: Right Dix-Hallpike: no nystagmus Left Dix-Hallpike: no nystagmus    OTHOSTATICS: not done   VESTIBULAR TREATMENT:                                                                                                    DATE: 05/09/2022 Nustep level 2 x6 min with PT present to discuss status Cervical retraction 2x10 Scapular retraction 2x10 Seated shoulder ER and horizontal abduction with red tband 2x10 each Standing rows and shoulder extension with red tband.  2x10 Alt LE toe taps  on 6" step without UE support 2x10 Tandem gait at barre x5 laps Side stepping at barre down and back x4 laps   DATE: 04/23/2022 Nustep level 2 x5 min with PT present to discuss status Cervical retraction 2x10 Scapular retraction 2x10 Dix Hallpike negative bilat Seated shoulder ER and horizontal abduction with yellow tband 2x10 each Trigger Point Dry-Needling  Treatment instructions: Expect mild to moderate muscle soreness. S/S of pneumothorax if dry needled over a lung field, and to seek immediate medical attention should they occur. Patient verbalized understanding of these instructions and education. Patient Consent Given: Yes Education handout provided: Yes Muscles treated: bilateral upper traps Electrical stimulation performed: No Parameters: N/A Treatment response/outcome: Utilized skilled palpation to locate trigger points.  Able to illicit muscle twitch and muscle elongation following Manual therapy:  Soft tissue mobilization to cervical paraspinals and upper trap Standing performing horizontal head turns and head nods x1 min each   DATE: 04/19/2022 Cervical retraction 2x10 Scapular retraction 2x10 Seated shoulder ER and horizontal abduction with yellow tband 2x10 each Standing performing horizontal head turns and head nods x1 min each Side stepping down length of parallel bars and back x3 laps  bilat Trigger Point Dry-Needling  Treatment instructions: Expect mild to moderate muscle soreness. S/S of pneumothorax if dry needled over a lung field, and to seek immediate medical attention should they occur. Patient verbalized understanding of these instructions and education. Patient Consent Given: Yes Education handout provided: Yes Muscles treated: bilateral upper traps Electrical stimulation performed: No Parameters: N/A Treatment response/outcome: Utilized skilled palpation to locate trigger points.  Able to illicit muscle twitch and muscle elongation following Nustep level 1 x4 min with PT present to discuss status     PATIENT EDUCATION: Education details: Pt issued HEP Person educated: Patient Education method: Explanation, Demonstration, and Handouts Education comprehension: verbalized understanding and returned demonstration  HOME EXERCISE PROGRAM: Access Code: [email or web address] URL: [email or web address] Date: 04/02/2022 Prepared by: Shelby Dubin Daryl Beehler  Exercises - Pencil Pushups  - 2 x daily - 7 x weekly - 2 sets - 10 reps - Seated Vertical Smooth Pursuit  - 2 x daily - 7 x weekly - 2 sets - 10 reps - Seated Diagonal Smooth Pursuit  - 2 x daily - 7 x weekly - 2 sets - 10 reps - Seated Horizontal Smooth Pursuit  - 2 x daily - 7 x weekly - 2 sets - 10 reps - Seated Gaze Stabilization with Head Rotation  - 1 x daily - 7 x weekly - 3 sets - 10 reps - Seated Gaze Stabilization with Head Nod  - 1 x daily - 7 x weekly - 3 sets - 10 reps - Seated Cervical Retraction  - 1 x daily - 7 x weekly - 2 sets - 10 reps - Seated Scapular Retraction  - 1 x daily - 7 x weekly - 2 sets - 10 reps - Seated Cervical Rotation AROM  - 1 x daily - 7 x weekly - 2 sets - 10 reps - Seated Cervical Extension AROM  - 1 x daily - 7 x weekly - 2 sets - 10 reps - Seated Upper Trapezius Stretch  - 1 x daily - 7 x weekly - 1 sets - 2 reps - 20 sec hold  GOALS: Goals reviewed with patient?  Yes  SHORT TERM GOALS: Target date: 04/11/2022   Pt will be independent with initial HEP. Baseline: Goal status: MET  2.  Pt will report at least a 30% improvement in dizziness.  Baseline:  Goal status: MET   LONG TERM GOALS: Target date: 05/16/2022    Pt will be independent with advanced HEP. Baseline:  Goal status: IN PROGRESS  2.  Pt will improve Dizziness Handicapped Inventory to no greater than 50% to demonstrate pts improved functional independence. Baseline: 68/100 Goal status: IN PROGRESS  3.  Pt will increase hip and knee strength to at least 4+/5 to allow him to perform transfers with improved ease. Baseline:  Goal status: INITIAL  4.  Pt will be able to ambulate at least 15 minutes with RPE no greater than 5/10 and without a loss of balance. Baseline: RPE of 5/10 with TUG Goal status: INITIAL   ASSESSMENT:  CLINICAL IMPRESSION: Mr Riegler presents to skilled PT with reports of no dizziness.  States that he does occasionally have some unsteadiness, but he is overall feeling better.  Patient continues to progress with activity tolerance and standing balance during PT appointment and requires less seated recovery periods than he did initially.  Patient continues to require skilled PT to progress with his standing balance and activity tolerance.    OBJECTIVE IMPAIRMENTS: Abnormal gait, decreased activity tolerance, decreased balance, difficulty walking, decreased strength, dizziness, impaired flexibility, postural dysfunction, obesity, and pain.   ACTIVITY LIMITATIONS: bending, standing, squatting, stairs, locomotion level, and caring for others  PARTICIPATION LIMITATIONS: cleaning and community activity  PERSONAL FACTORS: 3+ comorbidities: obesity, HTN, pre-diabetes, obstructive sleep apnea, cervical radiculopathy  are also affecting patient's functional outcome.   REHAB POTENTIAL: Good  CLINICAL DECISION MAKING: Evolving/moderate complexity  EVALUATION  COMPLEXITY: Moderate   PLAN:  PT FREQUENCY: 2x/week  PT DURATION: 8 weeks  PLANNED INTERVENTIONS: Therapeutic exercises, Therapeutic activity, Neuromuscular re-education, Balance training, Gait training, Patient/Family education, Self Care, Joint mobilization, Joint manipulation, Stair training, Vestibular training, Canalith repositioning, Aquatic Therapy, Dry Needling, Electrical stimulation, Spinal manipulation, Spinal mobilization, Cryotherapy, Moist heat, Taping, Ultrasound, Ionotophoresis 58m/ml Dexamethasone, Manual therapy, and Re-evaluation  PLAN FOR NEXT SESSION: assess and progress HEP, vestibular rehab, [employer] and canalith repositioning as indicated   SJuel Burrow PT, DPT 05/09/2022, 10:15 AM   [facility] [address] SKearney ParkGFulton [place] [address]Phone # [phone] [phone]

## 2022-05-11 ENCOUNTER — Encounter: Payer: Self-pay | Admitting: Rehabilitative and Restorative Service Providers"

## 2022-05-11 ENCOUNTER — Ambulatory Visit: Payer: HMO | Admitting: Rehabilitative and Restorative Service Providers"

## 2022-05-11 DIAGNOSIS — R42 Dizziness and giddiness: Secondary | ICD-10-CM

## 2022-05-11 DIAGNOSIS — R252 Cramp and spasm: Secondary | ICD-10-CM

## 2022-05-11 DIAGNOSIS — M6281 Muscle weakness (generalized): Secondary | ICD-10-CM

## 2022-05-11 DIAGNOSIS — R2689 Other abnormalities of gait and mobility: Secondary | ICD-10-CM

## 2022-05-11 DIAGNOSIS — M542 Cervicalgia: Secondary | ICD-10-CM

## 2022-05-11 NOTE — Therapy (Signed)
OUTPATIENT PHYSICAL THERAPY VESTIBULAR TREATMENT NOTE     Patient Name: James [name] MRN: [id number] DOB:Dec 26, 1955, 67 y.o., male Today's Date: 05/11/2022   PT End of Session - 05/11/22 0852     Visit Number 9    Date for PT Re-Evaluation 05/16/22    Authorization Type [employer]    Progress Note Due on Visit 10    PT Start Time 0845    PT Stop Time 0925    PT Time Calculation (min) 40 min    Activity Tolerance Patient tolerated treatment well    Behavior During Therapy [facility] for tasks assessed/performed             Past Medical History:  Diagnosis Date   Arthritis    Bilateral swelling of feet    Chicken pox    Complication of anesthesia    difficulty waking up   Depression    Hypertension    Joint pain    Obesity    Osteoarthritis    Prediabetes    Shortness of breath    Swelling    feet and legs   Vitamin D deficiency    Past Surgical History:  Procedure Laterality Date   cartilage removal     COLONOSCOPY WITH PROPOFOL N/A 12/14/2019   Procedure: COLONOSCOPY WITH PROPOFOL;  Surgeon: Doran Stabler, MD;  Location: [facility];  Service: Gastroenterology;  Laterality: N/A;   KNEE SURGERY  2000   POLYPECTOMY  12/14/2019   Procedure: POLYPECTOMY;  Surgeon: Doran Stabler, MD;  Location: [facility];  Service: Gastroenterology;;   TOTAL HIP ARTHROPLASTY Left 02/11/2019   Procedure: TOTAL HIP [place];  Surgeon: Gaynelle Arabian, MD;  Location: [facility];  Service: Orthopedics;  Laterality: Left;  163mn   Patient Active Problem List   Diagnosis Date Noted   Depression 09/08/2020   Special screening for malignant neoplasms, colon    Benign neoplasm of rectum    OA (osteoarthritis) of hip 02/11/2019   Other hyperlipidemia 04/10/2017   Vitamin D deficiency 04/10/2017   Prediabetes 12/17/2016   Class 3 severe obesity with serious comorbidity and body mass index (BMI) of 50.0 to 59.9 in adult ([facility] 12/17/2016   At risk for diabetes  mellitus 10/02/2016   Hyperglycemia 09/04/2016   Severe obesity (BMI >= 40) ([facility] 01/15/2013   OBESITY, MORBID 01/20/2007   Essential hypertension 10/31/2006    PCP: NDorothyann Peng NP REFERRING PROVIDER: NDorothyann Peng NP  REFERRING DIAG: R42 (ICD-10-CM) - Vertigo  THERAPY DIAG:  Dizziness and giddiness  Other abnormalities of gait and mobility  Muscle weakness (generalized)  Cervicalgia  Cramp and spasm  ONSET DATE: At least a couple of months  Rationale for Evaluation and Treatment: Rehabilitation  SUBJECTIVE:   SUBJECTIVE STATEMENT: Pt denies dizziness.  States that he is having increased cervical pain.  Pt accompanied by: self  PERTINENT HISTORY: pre-diabetes, HTN, cervical radiculopathy, OSA  PAIN:  Are you having pain? Yes: NPRS scale: 4/10 Pain location: knee, back and shoulders Pain description: sharp Aggravating factors: activity Relieving factors: ice, medication  PRECAUTIONS: Fall  WEIGHT BEARING RESTRICTIONS: No  FALLS: Has patient fallen in last 6 months? No  LIVING ENVIRONMENT: Lives with: lives with their spouse lives with wife who is primarily w/c bound and needs assistance with most daily activities Lives in: House/apartment Stairs:  2 story home, but they live on one level.  They have a platform lift to help get to driveway. Has following equipment at home: Single point cane  and Grab bars  PLOF: Independent and Leisure: watching tv and sports  PATIENT GOALS: To be able to function again and get rid of the dizziness.  OBJECTIVE:   DIAGNOSTIC FINDINGS: Unremarkable carotid Doppler ultrasound   COGNITION: Overall cognitive status: Within functional limits for tasks assessed   SENSATION: Reports numbness and tingling down both arms   POSTURE:  rounded shoulders and forward head  Cervical ROM:    Active A/ROM (deg) eval  Flexion 60  Extension 20  Right lateral flexion 40  Left lateral flexion 30  Right rotation 50  Left  rotation 40  (Blank rows = not tested)  STRENGTH:   Eval: BUE strength is WFL Bilateral hips 4/5 Right quad/hamstring 4/5   GAIT: Gait pattern: decreased stride length, antalgic, and wide BOS Distance walked: 100 ft Assistive device utilized: Single point cane Level of assistance: Modified independence Comments: Pt with dyspnea on exertion and increased pain  FUNCTIONAL TESTS:  Eval: Timed up and go (TUG): 19.3 sec without assistive device, RPE of 5/10  04/02/2022: Dynamic Visual Acuity:  Can read 20/20 on chart normally, with fast horizontal head movements can read 20/50 (difference of 4 lines)  04/12/2022: 3 minute walk test:  412 ft with RPE of 8/10  05/11/2022: 3 minute walk test:  445 ft with RPE of 8-9/10  PATIENT SURVEYS:  Eval: DHI 68 / 100 Physical Score: 18 / 28 Emotional Score: 14 / 36 Functional Score: 36 / 36  04/23/2022: DHI Total Score: 64 / 100 Physical Score: 18 / 28 Emotional Score: 12 / 36 Functional Score: 34 / 36  VESTIBULAR ASSESSMENT:  SYMPTOM BEHAVIOR:  Subjective history: Pt reports having dizziness for at least 2 months  Non-Vestibular symptoms: changes in hearing, changes in vision, neck pain, headaches, and nausea/vomiting  Type of dizziness: Blurred Vision, Imbalance (Disequilibrium), and Spinning/Vertigo  Frequency: daily  Duration: varies, but sometimes more than a couple of hours  Aggravating factors: Induced by motion: occur when walking, looking up at the ceiling, bending down to the ground, driving, and activity in general and "any type of exertion"  Relieving factors: head stationary and closing eyes  Progression of symptoms: unchanged  OCULOMOTOR EXAM:  Ocular Alignment: normal  Ocular ROM: No Limitations  Spontaneous Nystagmus: absent  Gaze-Induced Nystagmus: absent  Smooth Pursuits:  occasional nystagmus noted with tracking towards right side  Saccades: slow   VESTIBULAR - OCULAR REFLEX:   Positive head  thrust   POSITIONAL TESTING: Right Dix-Hallpike: no nystagmus Left Dix-Hallpike: no nystagmus    OTHOSTATICS: not done   VESTIBULAR TREATMENT:                                                                                                    DATE: 05/11/2022 Nustep level 3 x6 min with PT present to discuss status Cervical retraction against ball on the wall 2x10 Cervical SNAGs for rotation and extension with use of towel 2x10 each Seated shoulder ER and horizontal abduction with red tband 2x10 each Standing rows and shoulder extension with red tband.  2x10 3 minute walk test  DATE: 05/09/2022 Nustep level 2 x6 min with PT present to discuss status Cervical retraction 2x10 Scapular retraction 2x10 Seated shoulder ER and horizontal abduction with red tband 2x10 each Standing rows and shoulder extension with red tband.  2x10 Alt LE toe taps on 6" step without UE support 2x10 Tandem gait at barre x5 laps Side stepping at barre down and back x4 laps   DATE: 04/23/2022 Nustep level 2 x5 min with PT present to discuss status Cervical retraction 2x10 Scapular retraction 2x10 Dix Hallpike negative bilat Seated shoulder ER and horizontal abduction with yellow tband 2x10 each Trigger Point Dry-Needling  Treatment instructions: Expect mild to moderate muscle soreness. S/S of pneumothorax if dry needled over a lung field, and to seek immediate medical attention should they occur. Patient verbalized understanding of these instructions and education. Patient Consent Given: Yes Education handout provided: Yes Muscles treated: bilateral upper traps Electrical stimulation performed: No Parameters: N/A Treatment response/outcome: Utilized skilled palpation to locate trigger points.  Able to illicit muscle twitch and muscle elongation following Manual therapy:  Soft tissue mobilization to cervical paraspinals and upper trap Standing performing horizontal head turns and head nods x1 min  each      PATIENT EDUCATION: Education details: Pt issued HEP Person educated: Patient Education method: Explanation, Demonstration, and Handouts Education comprehension: verbalized understanding and returned demonstration  HOME EXERCISE PROGRAM: Access Code: [email or web address] URL: [email or web address] Date: 05/11/2022 Prepared by: Shelby Dubin Madeline Bebout  Exercises - Pencil Pushups  - 2 x daily - 7 x weekly - 2 sets - 10 reps - Seated Vertical Smooth Pursuit  - 2 x daily - 7 x weekly - 2 sets - 10 reps - Seated Diagonal Smooth Pursuit  - 2 x daily - 7 x weekly - 2 sets - 10 reps - Seated Horizontal Smooth Pursuit  - 2 x daily - 7 x weekly - 2 sets - 10 reps - Seated Gaze Stabilization with Head Rotation  - 1 x daily - 7 x weekly - 3 sets - 10 reps - Seated Gaze Stabilization with Head Nod  - 1 x daily - 7 x weekly - 3 sets - 10 reps - Seated Cervical Retraction  - 1 x daily - 7 x weekly - 2 sets - 10 reps - Seated Scapular Retraction  - 1 x daily - 7 x weekly - 2 sets - 10 reps - Seated Upper Trapezius Stretch  - 1 x daily - 7 x weekly - 1 sets - 2 reps - 20 sec hold - Seated Assisted Cervical Rotation with Towel  - 1 x daily - 7 x weekly - 2 sets - 10 reps - Cervical Extension AROM with Strap  - 1 x daily - 7 x weekly - 2 sets - 10 reps - Shoulder External Rotation and Scapular Retraction with Resistance  - 1 x daily - 7 x weekly - 2 sets - 10 reps - Standing Shoulder Horizontal Abduction with Resistance  - 1 x daily - 7 x weekly - 2 sets - 10 reps - Standing Shoulder Row with Anchored Resistance  - 1 x daily - 7 x weekly - 2 sets - 10 reps  GOALS: Goals reviewed with patient? Yes  SHORT TERM GOALS: Target date: 04/11/2022   Pt will be independent with initial HEP. Baseline: Goal status: MET  2.  Pt will report at least a 30% improvement in dizziness. Baseline:  Goal status: MET   LONG TERM GOALS: Target date: 05/16/2022    Pt  will be independent with advanced  HEP. Baseline:  Goal status: IN PROGRESS  2.  Pt will improve Dizziness Handicapped Inventory to no greater than 50% to demonstrate pts improved functional independence. Baseline: 68/100 Goal status: IN PROGRESS  3.  Pt will increase hip and knee strength to at least 4+/5 to allow him to perform transfers with improved ease. Baseline:  Goal status: INITIAL  4.  Pt will be able to ambulate at least 15 minutes with RPE no greater than 5/10 and without a loss of balance. Baseline: RPE of 5/10 with TUG Goal status: INITIAL   ASSESSMENT:  CLINICAL IMPRESSION: James [name] presents to skilled PT with reports of no dizziness, but having cervical pain. Patient able to progress HEP and provided him with red theraband for home use.  Patient integrated use of SNAGs to assist with decreased cervical pain and increased ROM.  Patient with reports of decreased pain following this exercise.  Patient with increased distance noted on 3 minute walk test today, but continues to have dyspnea.  However, should be noted that 3 minute walk was at the completion of the visit.    OBJECTIVE IMPAIRMENTS: Abnormal gait, decreased activity tolerance, decreased balance, difficulty walking, decreased strength, dizziness, impaired flexibility, postural dysfunction, obesity, and pain.   ACTIVITY LIMITATIONS: bending, standing, squatting, stairs, locomotion level, and caring for others  PARTICIPATION LIMITATIONS: cleaning and community activity  PERSONAL FACTORS: 3+ comorbidities: obesity, HTN, pre-diabetes, obstructive sleep apnea, cervical radiculopathy  are also affecting patient's functional outcome.   REHAB POTENTIAL: Good  CLINICAL DECISION MAKING: Evolving/moderate complexity  EVALUATION COMPLEXITY: Moderate   PLAN:  PT FREQUENCY: 2x/week  PT DURATION: 8 weeks  PLANNED INTERVENTIONS: Therapeutic exercises, Therapeutic activity, Neuromuscular re-education, Balance training, Gait training,  Patient/Family education, Self Care, Joint mobilization, Joint manipulation, Stair training, Vestibular training, Canalith repositioning, Aquatic Therapy, Dry Needling, Electrical stimulation, Spinal manipulation, Spinal mobilization, Cryotherapy, Moist heat, Taping, Ultrasound, Ionotophoresis '4mg'$ /ml Dexamethasone, Manual therapy, and Re-evaluation  PLAN FOR NEXT SESSION: assess and progress HEP, vestibular rehab, [employer]  and canalith repositioning as indicated   Juel Burrow, PT, DPT 05/11/2022, 9:33 AM   [facility] [address], [place], [place] [address] Phone # [phone] Fax [phone]

## 2022-05-14 ENCOUNTER — Ambulatory Visit: Payer: HMO | Admitting: Rehabilitative and Restorative Service Providers"

## 2022-05-14 ENCOUNTER — Encounter: Payer: Self-pay | Admitting: Rehabilitative and Restorative Service Providers"

## 2022-05-14 DIAGNOSIS — R252 Cramp and spasm: Secondary | ICD-10-CM

## 2022-05-14 DIAGNOSIS — R42 Dizziness and giddiness: Secondary | ICD-10-CM | POA: Diagnosis not present

## 2022-05-14 DIAGNOSIS — M6281 Muscle weakness (generalized): Secondary | ICD-10-CM

## 2022-05-14 DIAGNOSIS — M542 Cervicalgia: Secondary | ICD-10-CM

## 2022-05-14 DIAGNOSIS — R2689 Other abnormalities of gait and mobility: Secondary | ICD-10-CM

## 2022-05-14 NOTE — Therapy (Signed)
OUTPATIENT PHYSICAL THERAPY VESTIBULAR TREATMENT NOTE AND REASSESSMENT     Patient Name: James [name] MRN: [id number] DOB:06-Jul-1955, 67 y.o., male Today's Date: 05/14/2022   Progress Note Reporting Period 03/21/2022 to 05/14/2022  See note below for Objective Data and Assessment of Progress/Goals.        PT End of Session - 05/14/22 1017     Visit Number 10    Date for PT Re-Evaluation 07/06/22    Authorization Type [employer]    Progress Note Due on Visit 20    PT Start Time 1015    PT Stop Time 1055    PT Time Calculation (min) 40 min    Activity Tolerance Patient tolerated treatment well    Behavior During Therapy WFL for tasks assessed/performed             Past Medical History:  Diagnosis Date   Arthritis    Bilateral swelling of feet    Chicken pox    Complication of anesthesia    difficulty waking up   Depression    Hypertension    Joint pain    Obesity    Osteoarthritis    Prediabetes    Shortness of breath    Swelling    feet and legs   Vitamin D deficiency    Past Surgical History:  Procedure Laterality Date   cartilage removal     COLONOSCOPY WITH PROPOFOL N/A 12/14/2019   Procedure: COLONOSCOPY WITH PROPOFOL;  Surgeon: Doran Stabler, MD;  Location: [facility];  Service: Gastroenterology;  Laterality: N/A;   KNEE SURGERY  2000   POLYPECTOMY  12/14/2019   Procedure: POLYPECTOMY;  Surgeon: Doran Stabler, MD;  Location: [facility];  Service: Gastroenterology;;   TOTAL HIP ARTHROPLASTY Left 02/11/2019   Procedure: TOTAL HIP [place];  Surgeon: Gaynelle Arabian, MD;  Location: [facility];  Service: Orthopedics;  Laterality: Left;  198mn   Patient Active Problem List   Diagnosis Date Noted   Depression 09/08/2020   Special screening for malignant neoplasms, colon    Benign neoplasm of rectum    OA (osteoarthritis) of hip 02/11/2019   Other hyperlipidemia 04/10/2017   Vitamin D deficiency 04/10/2017    Prediabetes 12/17/2016   Class 3 severe obesity with serious comorbidity and body mass index (BMI) of 50.0 to 59.9 in adult ([facility] 12/17/2016   At risk for diabetes mellitus 10/02/2016   Hyperglycemia 09/04/2016   Severe obesity (BMI >= 40) ([facility] 01/15/2013   OBESITY, MORBID 01/20/2007   Essential hypertension 10/31/2006    PCP: NDorothyann Peng NP REFERRING PROVIDER: NDorothyann Peng NP  REFERRING DIAG: R42 (ICD-10-CM) - Vertigo  THERAPY DIAG:  Dizziness and giddiness - Plan: PT plan of care cert/re-cert  Other abnormalities of gait and mobility - Plan: PT plan of care cert/re-cert  Muscle weakness (generalized) - Plan: PT plan of care cert/re-cert  Cervicalgia - Plan: PT plan of care cert/re-cert  Cramp and spasm - Plan: PT plan of care cert/re-cert  ONSET DATE: At least a couple of months  Rationale for Evaluation and Treatment: Rehabilitation  SUBJECTIVE:   SUBJECTIVE STATEMENT: Pt denies dizziness.  States he is having increased unsteadiness and bilat knee pain.  Pt accompanied by: self  PERTINENT HISTORY: pre-diabetes, HTN, cervical radiculopathy, OSA  PAIN:  Are you having pain? Yes: NPRS scale: 4-6/10 Pain location: knees, back and shoulders Pain description: sharp Aggravating factors: activity Relieving factors: ice, medication  PRECAUTIONS: Fall  WEIGHT BEARING RESTRICTIONS: No  FALLS: Has  patient fallen in last 6 months? No  LIVING ENVIRONMENT: Lives with: lives with their spouse lives with wife who is primarily w/c bound and needs assistance with most daily activities Lives in: House/apartment Stairs:  2 story home, but they live on one level.  They have a platform lift to help get to driveway. Has following equipment at home: Single point cane and Grab bars  PLOF: Independent and Leisure: watching tv and sports  PATIENT GOALS: To be able to function again and get rid of the dizziness.  OBJECTIVE:   DIAGNOSTIC FINDINGS: Unremarkable carotid  Doppler ultrasound   COGNITION: Overall cognitive status: Within functional limits for tasks assessed   SENSATION: Reports numbness and tingling down both arms   POSTURE:  rounded shoulders and forward head  Cervical ROM:    Active A/ROM (deg) eval A/ROM (deg) 05/14/22  Flexion 60 60  Extension 20 20  Right lateral flexion 40 35  Left lateral flexion 30 35  Right rotation 50 60  Left rotation 40 60  (Blank rows = not tested)  STRENGTH:   Eval: BUE strength is WFL Bilateral hips 4/5 Right quad/hamstring 4/5  05/14/2022: Bilateral hips 4 to 4+/5 Right quad/hamstring 4 to 4+/5   GAIT: Gait pattern: decreased stride length, antalgic, and wide BOS Distance walked: 100 ft Assistive device utilized: Single point cane Level of assistance: Modified independence Comments: Pt with dyspnea on exertion and increased pain  FUNCTIONAL TESTS:  Eval: Timed up and go (TUG): 19.3 sec without assistive device, RPE of 5/10  04/02/2022: Dynamic Visual Acuity:  Can read 20/20 on chart normally, with fast horizontal head movements can read 20/50 (difference of 4 lines)  04/12/2022: 3 minute walk test:  412 ft with RPE of 8/10  05/11/2022: 3 minute walk test:  445 ft with RPE of 8-9/10  05/14/2022: Dynamic Visual Acuity:  Can read 20/20 on chart normally, with fast horizontal head movements can read 20/30 (difference of 2 lines) Timed up and go (TUG): 12.6 sec without assistive device, RPE of 2/10 5 times sit to/from stand:  18.4 sec without UE  PATIENT SURVEYS:  Eval: DHI 68 / 100 Physical Score: 18 / 28 Emotional Score: 14 / 36 Functional Score: 36 / 36  04/23/2022: DHI Total Score: 64 / 100 Physical Score: 18 / 28 Emotional Score: 12 / 36 Functional Score: 34 / 36  05/14/2022: DHI Total Score: 12 / 100 Physical Score: 4 / 28 Emotional Score: 2 / 36 Functional Score: 6 / 36  VESTIBULAR ASSESSMENT:  SYMPTOM BEHAVIOR:  Subjective history: Pt reports having dizziness for  at least 2 months  Non-Vestibular symptoms: changes in hearing, changes in vision, neck pain, headaches, and nausea/vomiting  Type of dizziness: Blurred Vision, Imbalance (Disequilibrium), and Spinning/Vertigo  Frequency: daily  Duration: varies, but sometimes more than a couple of hours  Aggravating factors: Induced by motion: occur when walking, looking up at the ceiling, bending down to the ground, driving, and activity in general and "any type of exertion"  Relieving factors: head stationary and closing eyes  Progression of symptoms: unchanged  OCULOMOTOR EXAM:  Ocular Alignment: normal  Ocular ROM: No Limitations  Spontaneous Nystagmus: absent  Gaze-Induced Nystagmus: absent  Smooth Pursuits:  occasional nystagmus noted with tracking towards right side  Saccades: slow   VESTIBULAR - OCULAR REFLEX:   Positive head thrust   POSITIONAL TESTING: Right Dix-Hallpike: no nystagmus Left Dix-Hallpike: no nystagmus    OTHOSTATICS: not done   VESTIBULAR TREATMENT:  DATE: 05/14/2022 Nustep level 3 x4 min with PT present to discuss status (limited secondary to pain) Cervical SNAGs for rotation and extension with use of towel 2x10 each Dizziness Handicapped Inventory Vestibular testing (see above) Sit to/from stand 2x5 Seated shoulder ER and horizontal abduction with green tband 2x10 each Seated hip clamshell with green tband 2x10 Seated hamstring curl with green tband 2x10 bilat Seated LAQ with 2 sec hold 2x10 bilat   DATE: 05/11/2022 Nustep level 3 x6 min with PT present to discuss status Cervical retraction against ball on the wall 2x10 Cervical SNAGs for rotation and extension with use of towel 2x10 each Seated shoulder ER and horizontal abduction with red tband 2x10 each Standing rows and shoulder extension with red tband.  2x10 3 minute walk test   DATE: 05/09/2022 Nustep level  2 x6 min with PT present to discuss status Cervical retraction 2x10 Scapular retraction 2x10 Seated shoulder ER and horizontal abduction with red tband 2x10 each Standing rows and shoulder extension with red tband.  2x10 Alt LE toe taps on 6" step without UE support 2x10 Tandem gait at barre x5 laps Side stepping at barre down and back x4 laps     PATIENT EDUCATION: Education details: Pt issued HEP Person educated: Patient Education method: Explanation, Demonstration, and Handouts Education comprehension: verbalized understanding and returned demonstration  HOME EXERCISE PROGRAM: Access Code: [email or web address] URL: [email or web address] Date: 05/11/2022 Prepared by: Shelby Dubin Isam Unrein  Exercises - Pencil Pushups  - 2 x daily - 7 x weekly - 2 sets - 10 reps - Seated Vertical Smooth Pursuit  - 2 x daily - 7 x weekly - 2 sets - 10 reps - Seated Diagonal Smooth Pursuit  - 2 x daily - 7 x weekly - 2 sets - 10 reps - Seated Horizontal Smooth Pursuit  - 2 x daily - 7 x weekly - 2 sets - 10 reps - Seated Gaze Stabilization with Head Rotation  - 1 x daily - 7 x weekly - 3 sets - 10 reps - Seated Gaze Stabilization with Head Nod  - 1 x daily - 7 x weekly - 3 sets - 10 reps - Seated Cervical Retraction  - 1 x daily - 7 x weekly - 2 sets - 10 reps - Seated Scapular Retraction  - 1 x daily - 7 x weekly - 2 sets - 10 reps - Seated Upper Trapezius Stretch  - 1 x daily - 7 x weekly - 1 sets - 2 reps - 20 sec hold - Seated Assisted Cervical Rotation with Towel  - 1 x daily - 7 x weekly - 2 sets - 10 reps - Cervical Extension AROM with Strap  - 1 x daily - 7 x weekly - 2 sets - 10 reps - Shoulder External Rotation and Scapular Retraction with Resistance  - 1 x daily - 7 x weekly - 2 sets - 10 reps - Standing Shoulder Horizontal Abduction with Resistance  - 1 x daily - 7 x weekly - 2 sets - 10 reps - Standing Shoulder Row with Anchored Resistance  - 1 x daily - 7 x weekly - 2 sets - 10  reps  GOALS: Goals reviewed with patient? Yes  SHORT TERM GOALS: Target date: 04/11/2022   Pt will be independent with initial HEP. Baseline: Goal status: MET  2.  Pt will report at least a 30% improvement in dizziness. Baseline:  Goal status: MET   LONG TERM GOALS: Target date: 07/06/2022    Pt  will be independent with advanced HEP. Baseline:  Goal status: IN PROGRESS  2.  Pt will improve Dizziness Handicapped Inventory to no greater than 50% to demonstrate pts improved functional independence. Baseline: 68/100 Goal status: MET on 05/14/2022  3.  Pt will increase hip and knee strength to at least 4+/5 to allow him to perform transfers with improved ease. Baseline:  Goal status: IN PROGRESS  4.  Pt will be able to ambulate at least 15 minutes with RPE no greater than 5/10 and without a loss of balance. Baseline: RPE of 5/10 with TUG (on 05/11/22, RPE of 8-9/10 with 3 minute walk test) Goal status: IN PROGRESS   ASSESSMENT:  CLINICAL IMPRESSION: Mr Thayer presents to skilled PT with reports of no dizziness, but continued reports of knee pain and cervical pain.  Patient did admit to continued feelings of unsteadiness with fear of falling.  Patient has improved on Dizziness Handicapped Inventory score as well as dynamic visual acuity score.  Patient with improved score noted on TUG with decreased dyspnea.  However, patient continues to have increased dyspnea with increased ambulation, such as 3 minute walk test last session.  Patient has not yet met his goals and would benefit from continued skilled PT of 1-2x/week for an additional 8 weeks to further progress towards goal related activities.    OBJECTIVE IMPAIRMENTS: Abnormal gait, decreased activity tolerance, decreased balance, difficulty walking, decreased strength, dizziness, impaired flexibility, postural dysfunction, obesity, and pain.   ACTIVITY LIMITATIONS: bending, standing, squatting, stairs, locomotion level, and  caring for others  PARTICIPATION LIMITATIONS: cleaning and community activity  PERSONAL FACTORS: 3+ comorbidities: obesity, HTN, pre-diabetes, obstructive sleep apnea, cervical radiculopathy  are also affecting patient's functional outcome.   REHAB POTENTIAL: Good  CLINICAL DECISION MAKING: Evolving/moderate complexity  EVALUATION COMPLEXITY: Moderate   PLAN:  PT FREQUENCY: 1-2x/week  PT DURATION: 8 weeks  PLANNED INTERVENTIONS: Therapeutic exercises, Therapeutic activity, Neuromuscular re-education, Balance training, Gait training, Patient/Family education, Self Care, Joint mobilization, Joint manipulation, Stair training, Vestibular training, Canalith repositioning, Aquatic Therapy, Dry Needling, Electrical stimulation, Spinal manipulation, Spinal mobilization, Cryotherapy, Moist heat, Taping, Ultrasound, Ionotophoresis '4mg'$ /ml Dexamethasone, Manual therapy, and Re-evaluation  PLAN FOR NEXT SESSION: assess and progress HEP, vestibular rehab, strengthening, Marye Round  and canalith repositioning as indicated   Juel Burrow, PT, DPT 05/14/2022, 11:03 AM   [facility] [address], [place] [address] [place],  [address] Phone # [phone] Fax [phone]

## 2022-05-16 ENCOUNTER — Ambulatory Visit: Payer: HMO | Admitting: Rehabilitative and Restorative Service Providers"

## 2022-05-16 ENCOUNTER — Encounter: Payer: Self-pay | Admitting: Rehabilitative and Restorative Service Providers"

## 2022-05-16 DIAGNOSIS — R252 Cramp and spasm: Secondary | ICD-10-CM

## 2022-05-16 DIAGNOSIS — M542 Cervicalgia: Secondary | ICD-10-CM

## 2022-05-16 DIAGNOSIS — M6281 Muscle weakness (generalized): Secondary | ICD-10-CM

## 2022-05-16 DIAGNOSIS — R2689 Other abnormalities of gait and mobility: Secondary | ICD-10-CM

## 2022-05-16 DIAGNOSIS — R42 Dizziness and giddiness: Secondary | ICD-10-CM

## 2022-05-16 NOTE — Therapy (Signed)
OUTPATIENT PHYSICAL THERAPY VESTIBULAR TREATMENT NOTE     Patient Name: James [name] MRN: [id number] DOB:07/07/1955, 67 y.o., male Today's Date: 05/16/2022        PT End of Session - 05/16/22 0850     Visit Number 11    Date for PT Re-Evaluation 07/06/22    Authorization Type [employer]    Progress Note Due on Visit 20    PT Start Time 0845    PT Stop Time 0925    PT Time Calculation (min) 40 min    Activity Tolerance Patient tolerated treatment well    Behavior During Therapy [facility] for tasks assessed/performed             Past Medical History:  Diagnosis Date   Arthritis    Bilateral swelling of feet    Chicken pox    Complication of anesthesia    difficulty waking up   Depression    Hypertension    Joint pain    Obesity    Osteoarthritis    Prediabetes    Shortness of breath    Swelling    feet and legs   Vitamin D deficiency    Past Surgical History:  Procedure Laterality Date   cartilage removal     COLONOSCOPY WITH PROPOFOL N/A 12/14/2019   Procedure: COLONOSCOPY WITH PROPOFOL;  Surgeon: Doran Stabler, MD;  Location: [facility];  Service: Gastroenterology;  Laterality: N/A;   KNEE SURGERY  2000   POLYPECTOMY  12/14/2019   Procedure: POLYPECTOMY;  Surgeon: Doran Stabler, MD;  Location: [facility];  Service: Gastroenterology;;   TOTAL HIP ARTHROPLASTY Left 02/11/2019   Procedure: TOTAL HIP [place];  Surgeon: Gaynelle Arabian, MD;  Location: [facility];  Service: Orthopedics;  Laterality: Left;  159mn   Patient Active Problem List   Diagnosis Date Noted   Depression 09/08/2020   Special screening for malignant neoplasms, colon    Benign neoplasm of rectum    OA (osteoarthritis) of hip 02/11/2019   Other hyperlipidemia 04/10/2017   Vitamin D deficiency 04/10/2017   Prediabetes 12/17/2016   Class 3 severe obesity with serious comorbidity and body mass index (BMI) of 50.0 to 59.9 in adult ([place] 12/17/2016   At risk for  diabetes mellitus 10/02/2016   Hyperglycemia 09/04/2016   Severe obesity (BMI >= 40) ([place] 01/15/2013   OBESITY, MORBID 01/20/2007   Essential hypertension 10/31/2006    PCP: NDorothyann Peng NP REFERRING PROVIDER: NDorothyann Peng NP  REFERRING DIAG: R42 (ICD-10-CM) - Vertigo  THERAPY DIAG:  Dizziness and giddiness  Other abnormalities of gait and mobility  Muscle weakness (generalized)  Cervicalgia  Cramp and spasm  ONSET DATE: At least a couple of months  Rationale for Evaluation and Treatment: Rehabilitation  SUBJECTIVE:   SUBJECTIVE STATEMENT: Pt denies dizziness.  Pt is reporting increased pain in his knees today.  Pt accompanied by: self  PERTINENT HISTORY: pre-diabetes, HTN, cervical radiculopathy, OSA  PAIN:  Are you having pain? Yes: NPRS scale: 6/10 Pain location: knees, back and shoulders Pain description: sharp Aggravating factors: activity Relieving factors: ice, medication  PRECAUTIONS: Fall  WEIGHT BEARING RESTRICTIONS: No  FALLS: Has patient fallen in last 6 months? No  LIVING ENVIRONMENT: Lives with: lives with their spouse lives with wife who is primarily w/c bound and needs assistance with most daily activities Lives in: House/apartment Stairs:  2 story home, but they live on one level.  They have a platform lift to help get to driveway. Has following  equipment at home: Single point cane and Grab bars  PLOF: Independent and Leisure: watching tv and sports  PATIENT GOALS: To be able to function again and get rid of the dizziness.  OBJECTIVE:   DIAGNOSTIC FINDINGS: Unremarkable carotid Doppler ultrasound   COGNITION: Overall cognitive status: Within functional limits for tasks assessed   SENSATION: Reports numbness and tingling down both arms   POSTURE:  rounded shoulders and forward head  Cervical ROM:    Active A/ROM (deg) eval A/ROM (deg) 05/14/22  Flexion 60 60  Extension 20 20  Right lateral flexion 40 35  Left lateral  flexion 30 35  Right rotation 50 60  Left rotation 40 60  (Blank rows = not tested)  STRENGTH:   Eval: BUE strength is WFL Bilateral hips 4/5 Right quad/hamstring 4/5  05/14/2022: Bilateral hips 4 to 4+/5 Right quad/hamstring 4 to 4+/5   GAIT: Gait pattern: decreased stride length, antalgic, and wide BOS Distance walked: 100 ft Assistive device utilized: Single point cane Level of assistance: Modified independence Comments: Pt with dyspnea on exertion and increased pain  FUNCTIONAL TESTS:  Eval: Timed up and go (TUG): 19.3 sec without assistive device, RPE of 5/10  04/02/2022: Dynamic Visual Acuity:  Can read 20/20 on chart normally, with fast horizontal head movements can read 20/50 (difference of 4 lines)  04/12/2022: 3 minute walk test:  412 ft with RPE of 8/10  05/11/2022: 3 minute walk test:  445 ft with RPE of 8-9/10  05/14/2022: Dynamic Visual Acuity:  Can read 20/20 on chart normally, with fast horizontal head movements can read 20/30 (difference of 2 lines) Timed up and go (TUG): 12.6 sec without assistive device, RPE of 2/10 5 times sit to/from stand:  18.4 sec without UE  PATIENT SURVEYS:  Eval: DHI 68 / 100 Physical Score: 18 / 28 Emotional Score: 14 / 36 Functional Score: 36 / 36  04/23/2022: DHI Total Score: 64 / 100 Physical Score: 18 / 28 Emotional Score: 12 / 36 Functional Score: 34 / 36  05/14/2022: DHI Total Score: 12 / 100 Physical Score: 4 / 28 Emotional Score: 2 / 36 Functional Score: 6 / 36  VESTIBULAR ASSESSMENT:  SYMPTOM BEHAVIOR:  Subjective history: Pt reports having dizziness for at least 2 months  Non-Vestibular symptoms: changes in hearing, changes in vision, neck pain, headaches, and nausea/vomiting  Type of dizziness: Blurred Vision, Imbalance (Disequilibrium), and Spinning/Vertigo  Frequency: daily  Duration: varies, but sometimes more than a couple of hours  Aggravating factors: Induced by motion: occur when walking,  looking up at the ceiling, bending down to the ground, driving, and activity in general and "any type of exertion"  Relieving factors: head stationary and closing eyes  Progression of symptoms: unchanged  OCULOMOTOR EXAM:  Ocular Alignment: normal  Ocular ROM: No Limitations  Spontaneous Nystagmus: absent  Gaze-Induced Nystagmus: absent  Smooth Pursuits:  occasional nystagmus noted with tracking towards right side  Saccades: slow   VESTIBULAR - OCULAR REFLEX:   Positive head thrust   POSITIONAL TESTING: Right Dix-Hallpike: no nystagmus Left Dix-Hallpike: no nystagmus    OTHOSTATICS: not done   VESTIBULAR TREATMENT:  DATE: 05/16/2022 Nustep level 3 x6 min with PT present to discuss status Cervical SNAGs for rotation and extension with use of towel 2x10 each Sit to/from stand 2x5 Seated shoulder ER and horizontal abduction with green tband 2x10 each Seated LAQ with 2 sec hold 2x10 bilat Seated hip clamshell with green tband 2x10 Seated hamstring curl with green tband 2x10 bilat   DATE: 05/14/2022 Nustep level 3 x4 min with PT present to discuss status (limited secondary to pain) Cervical SNAGs for rotation and extension with use of towel 2x10 each Dizziness Handicapped Inventory Vestibular testing (see above) Sit to/from stand 2x5 Seated shoulder ER and horizontal abduction with green tband 2x10 each Seated hip clamshell with green tband 2x10 Seated hamstring curl with green tband 2x10 bilat Seated LAQ with 2 sec hold 2x10 bilat   DATE: 05/11/2022 Nustep level 3 x6 min with PT present to discuss status Cervical retraction against ball on the wall 2x10 Cervical SNAGs for rotation and extension with use of towel 2x10 each Seated shoulder ER and horizontal abduction with red tband 2x10 each Standing rows and shoulder extension with red tband.  2x10 3 minute walk  test    PATIENT EDUCATION: Education details: Pt issued HEP Person educated: Patient Education method: Explanation, Demonstration, and Handouts Education comprehension: verbalized understanding and returned demonstration  HOME EXERCISE PROGRAM: Access Code: [email or web address] URL: [email or web address] Date: 05/11/2022 Prepared by: Shelby Dubin Lochlan Grygiel  Exercises - Pencil Pushups  - 2 x daily - 7 x weekly - 2 sets - 10 reps - Seated Vertical Smooth Pursuit  - 2 x daily - 7 x weekly - 2 sets - 10 reps - Seated Diagonal Smooth Pursuit  - 2 x daily - 7 x weekly - 2 sets - 10 reps - Seated Horizontal Smooth Pursuit  - 2 x daily - 7 x weekly - 2 sets - 10 reps - Seated Gaze Stabilization with Head Rotation  - 1 x daily - 7 x weekly - 3 sets - 10 reps - Seated Gaze Stabilization with Head Nod  - 1 x daily - 7 x weekly - 3 sets - 10 reps - Seated Cervical Retraction  - 1 x daily - 7 x weekly - 2 sets - 10 reps - Seated Scapular Retraction  - 1 x daily - 7 x weekly - 2 sets - 10 reps - Seated Upper Trapezius Stretch  - 1 x daily - 7 x weekly - 1 sets - 2 reps - 20 sec hold - Seated Assisted Cervical Rotation with Towel  - 1 x daily - 7 x weekly - 2 sets - 10 reps - Cervical Extension AROM with Strap  - 1 x daily - 7 x weekly - 2 sets - 10 reps - Shoulder External Rotation and Scapular Retraction with Resistance  - 1 x daily - 7 x weekly - 2 sets - 10 reps - Standing Shoulder Horizontal Abduction with Resistance  - 1 x daily - 7 x weekly - 2 sets - 10 reps - Standing Shoulder Row with Anchored Resistance  - 1 x daily - 7 x weekly - 2 sets - 10 reps  GOALS: Goals reviewed with patient? Yes  SHORT TERM GOALS: Target date: 04/11/2022   Pt will be independent with initial HEP. Baseline: Goal status: MET  2.  Pt will report at least a 30% improvement in dizziness. Baseline:  Goal status: MET   LONG TERM GOALS: Target date: 07/06/2022    Pt will be independent with advanced  HEP. Baseline:   Goal status: IN PROGRESS  2.  Pt will improve Dizziness Handicapped Inventory to no greater than 50% to demonstrate pts improved functional independence. Baseline: 68/100 Goal status: MET on 05/14/2022  3.  Pt will increase hip and knee strength to at least 4+/5 to allow him to perform transfers with improved ease. Baseline:  Goal status: IN PROGRESS  4.  Pt will be able to ambulate at least 15 minutes with RPE no greater than 5/10 and without a loss of balance. Baseline: RPE of 5/10 with TUG (on 05/11/22, RPE of 8-9/10 with 3 minute walk test) Goal status: IN PROGRESS   ASSESSMENT:  CLINICAL IMPRESSION: Mr Eisner presents to skilled PT with reports that he is not feeling as unsteady today and did not feel that he needed to use the cane.  Patient is reporting that he is continuing to have some increased knee pain.  Patient able to complete full 6 minutes on Nustep today, but did admit to some increased pain during the last minute of the Nustep.  Patient continues to progress towards goal related activities with new certification already being signed by Dorothyann Peng, NP.    OBJECTIVE IMPAIRMENTS: Abnormal gait, decreased activity tolerance, decreased balance, difficulty walking, decreased strength, dizziness, impaired flexibility, postural dysfunction, obesity, and pain.   ACTIVITY LIMITATIONS: bending, standing, squatting, stairs, locomotion level, and caring for others  PARTICIPATION LIMITATIONS: cleaning and community activity  PERSONAL FACTORS: 3+ comorbidities: obesity, HTN, pre-diabetes, obstructive sleep apnea, cervical radiculopathy  are also affecting patient's functional outcome.   REHAB POTENTIAL: Good  CLINICAL DECISION MAKING: Evolving/moderate complexity  EVALUATION COMPLEXITY: Moderate   PLAN:  PT FREQUENCY: 1-2x/week  PT DURATION: 8 weeks  PLANNED INTERVENTIONS: Therapeutic exercises, Therapeutic activity, Neuromuscular re-education, Balance training, Gait  training, Patient/Family education, Self Care, Joint mobilization, Joint manipulation, Stair training, Vestibular training, Canalith repositioning, Aquatic Therapy, Dry Needling, Electrical stimulation, Spinal manipulation, Spinal mobilization, Cryotherapy, Moist heat, Taping, Ultrasound, Ionotophoresis '4mg'$ /ml Dexamethasone, Manual therapy, and Re-evaluation  PLAN FOR NEXT SESSION: assess and progress HEP, vestibular rehab, strengthening, Marye Round  and canalith repositioning as indicated   Juel Burrow, PT, DPT 05/16/2022, 9:38 AM   [facility] [address] [place] [address] [place], [place] [address] Phone # [phone] Fax [phone]

## 2022-05-17 ENCOUNTER — Encounter: Payer: Self-pay | Admitting: Adult Health

## 2022-05-17 NOTE — Telephone Encounter (Signed)
FYI

## 2022-05-22 ENCOUNTER — Encounter: Payer: Self-pay | Admitting: Rehabilitative and Restorative Service Providers"

## 2022-05-22 ENCOUNTER — Ambulatory Visit: Payer: HMO | Admitting: Rehabilitative and Restorative Service Providers"

## 2022-05-22 DIAGNOSIS — R252 Cramp and spasm: Secondary | ICD-10-CM

## 2022-05-22 DIAGNOSIS — M542 Cervicalgia: Secondary | ICD-10-CM

## 2022-05-22 DIAGNOSIS — R42 Dizziness and giddiness: Secondary | ICD-10-CM

## 2022-05-22 DIAGNOSIS — M6281 Muscle weakness (generalized): Secondary | ICD-10-CM

## 2022-05-22 DIAGNOSIS — R2689 Other abnormalities of gait and mobility: Secondary | ICD-10-CM

## 2022-05-22 NOTE — Therapy (Signed)
OUTPATIENT PHYSICAL THERAPY VESTIBULAR TREATMENT NOTE     Patient Name: James [name] MRN: [id number] DOB:07/04/1955, 67 y.o., male Today's Date: 05/22/2022        PT End of Session - 05/22/22 1235     Visit Number 12    Date for PT Re-Evaluation 07/06/22    Authorization Type [employer]    Progress Note Due on Visit 20    PT Start Time 1232    PT Stop Time 1310    PT Time Calculation (min) 38 min    Activity Tolerance Patient tolerated treatment well    Behavior During Therapy [facility] for tasks assessed/performed             Past Medical History:  Diagnosis Date   Arthritis    Bilateral swelling of feet    Chicken pox    Complication of anesthesia    difficulty waking up   Depression    Hypertension    Joint pain    Obesity    Osteoarthritis    Prediabetes    Shortness of breath    Swelling    feet and legs   Vitamin D deficiency    Past Surgical History:  Procedure Laterality Date   cartilage removal     COLONOSCOPY WITH PROPOFOL N/A 12/14/2019   Procedure: COLONOSCOPY WITH PROPOFOL;  Surgeon: Doran Stabler, MD;  Location: [facility];  Service: Gastroenterology;  Laterality: N/A;   KNEE SURGERY  2000   POLYPECTOMY  12/14/2019   Procedure: POLYPECTOMY;  Surgeon: Doran Stabler, MD;  Location: [facility];  Service: Gastroenterology;;   TOTAL HIP ARTHROPLASTY Left 02/11/2019   Procedure: TOTAL HIP [place];  Surgeon: Gaynelle Arabian, MD;  Location: [facility];  Service: Orthopedics;  Laterality: Left;  120mn   Patient Active Problem List   Diagnosis Date Noted   Depression 09/08/2020   Special screening for malignant neoplasms, colon    Benign neoplasm of rectum    OA (osteoarthritis) of hip 02/11/2019   Other hyperlipidemia 04/10/2017   Vitamin D deficiency 04/10/2017   Prediabetes 12/17/2016   Class 3 severe obesity with serious comorbidity and body mass index (BMI) of 50.0 to 59.9 in adult ([facility] 12/17/2016   At risk for  diabetes mellitus 10/02/2016   Hyperglycemia 09/04/2016   Severe obesity (BMI >= 40) ([facility] 01/15/2013   OBESITY, MORBID 01/20/2007   Essential hypertension 10/31/2006    PCP: NDorothyann Peng NP REFERRING PROVIDER: NDorothyann Peng NP  REFERRING DIAG: R42 (ICD-10-CM) - Vertigo  THERAPY DIAG:  Dizziness and giddiness  Other abnormalities of gait and mobility  Muscle weakness (generalized)  Cervicalgia  Cramp and spasm  ONSET DATE: At least a couple of months  Rationale for Evaluation and Treatment: Rehabilitation  SUBJECTIVE:   SUBJECTIVE STATEMENT: Pt states that he only has occasional dizziness.  Overall, is feeling better.  Pt accompanied by: self  PERTINENT HISTORY: pre-diabetes, HTN, cervical radiculopathy, OSA  PAIN:  Are you having pain? Yes: NPRS scale: 6/10 Pain location: knees, back and shoulders Pain description: sharp Aggravating factors: activity Relieving factors: ice, medication  PRECAUTIONS: Fall  WEIGHT BEARING RESTRICTIONS: No  FALLS: Has patient fallen in last 6 months? No  LIVING ENVIRONMENT: Lives with: lives with their spouse lives with wife who is primarily w/c bound and needs assistance with most daily activities Lives in: House/apartment Stairs:  2 story home, but they live on one level.  They have a platform lift to help get to driveway. Has following  equipment at home: Single point cane and Grab bars  PLOF: Independent and Leisure: watching tv and sports  PATIENT GOALS: To be able to function again and get rid of the dizziness.  OBJECTIVE:   DIAGNOSTIC FINDINGS: Unremarkable carotid Doppler ultrasound   COGNITION: Overall cognitive status: Within functional limits for tasks assessed   SENSATION: Reports numbness and tingling down both arms   POSTURE:  rounded shoulders and forward head  Cervical ROM:    Active A/ROM (deg) eval A/ROM (deg) 05/14/22  Flexion 60 60  Extension 20 20  Right lateral flexion 40 35  Left  lateral flexion 30 35  Right rotation 50 60  Left rotation 40 60  (Blank rows = not tested)  STRENGTH:   Eval: BUE strength is WFL Bilateral hips 4/5 Right quad/hamstring 4/5  05/14/2022: Bilateral hips 4 to 4+/5 Right quad/hamstring 4 to 4+/5   GAIT: Gait pattern: decreased stride length, antalgic, and wide BOS Distance walked: 100 ft Assistive device utilized: Single point cane Level of assistance: Modified independence Comments: Pt with dyspnea on exertion and increased pain  FUNCTIONAL TESTS:  Eval: Timed up and go (TUG): 19.3 sec without assistive device, RPE of 5/10  04/02/2022: Dynamic Visual Acuity:  Can read 20/20 on chart normally, with fast horizontal head movements can read 20/50 (difference of 4 lines)  04/12/2022: 3 minute walk test:  412 ft with RPE of 8/10  05/11/2022: 3 minute walk test:  445 ft with RPE of 8-9/10  05/14/2022: Dynamic Visual Acuity:  Can read 20/20 on chart normally, with fast horizontal head movements can read 20/30 (difference of 2 lines) Timed up and go (TUG): 12.6 sec without assistive device, RPE of 2/10 5 times sit to/from stand:  18.4 sec without UE  PATIENT SURVEYS:  Eval: DHI 68 / 100 Physical Score: 18 / 28 Emotional Score: 14 / 36 Functional Score: 36 / 36  04/23/2022: DHI Total Score: 64 / 100 Physical Score: 18 / 28 Emotional Score: 12 / 36 Functional Score: 34 / 36  05/14/2022: DHI Total Score: 12 / 100 Physical Score: 4 / 28 Emotional Score: 2 / 36 Functional Score: 6 / 36  VESTIBULAR ASSESSMENT:  SYMPTOM BEHAVIOR:  Subjective history: Pt reports having dizziness for at least 2 months  Non-Vestibular symptoms: changes in hearing, changes in vision, neck pain, headaches, and nausea/vomiting  Type of dizziness: Blurred Vision, Imbalance (Disequilibrium), and Spinning/Vertigo  Frequency: daily  Duration: varies, but sometimes more than a couple of hours  Aggravating factors: Induced by motion: occur when  walking, looking up at the ceiling, bending down to the ground, driving, and activity in general and "any type of exertion"  Relieving factors: head stationary and closing eyes  Progression of symptoms: unchanged  OCULOMOTOR EXAM:  Ocular Alignment: normal  Ocular ROM: No Limitations  Spontaneous Nystagmus: absent  Gaze-Induced Nystagmus: absent  Smooth Pursuits:  occasional nystagmus noted with tracking towards right side  Saccades: slow   VESTIBULAR - OCULAR REFLEX:   Positive head thrust   POSITIONAL TESTING: Right Dix-Hallpike: no nystagmus Left Dix-Hallpike: no nystagmus    OTHOSTATICS: not done   VESTIBULAR TREATMENT:  DATE: 05/22/2022 Cervical SNAGs for rotation and extension with use of towel 2x10 each Nustep level 3 x6 min with PT present to discuss status Seated LAQ with 2 sec hold 2x10 bilat Seated shoulder ER and horizontal abduction with green tband 2x10 each Seated hip clamshell with green tband 2x10 Seated hamstring curl with green tband 2x10 bilat Sit to/from stand 2x5   DATE: 05/16/2022 Nustep level 3 x6 min with PT present to discuss status Cervical SNAGs for rotation and extension with use of towel 2x10 each Sit to/from stand 2x5 Seated shoulder ER and horizontal abduction with green tband 2x10 each Seated LAQ with 2 sec hold 2x10 bilat Seated hip clamshell with green tband 2x10 Seated hamstring curl with green tband 2x10 bilat   DATE: 05/14/2022 Nustep level 3 x4 min with PT present to discuss status (limited secondary to pain) Cervical SNAGs for rotation and extension with use of towel 2x10 each Dizziness Handicapped Inventory Vestibular testing (see above) Sit to/from stand 2x5 Seated shoulder ER and horizontal abduction with green tband 2x10 each Seated hip clamshell with green tband 2x10 Seated hamstring curl with green tband 2x10 bilat Seated LAQ  with 2 sec hold 2x10 bilat    PATIENT EDUCATION: Education details: Pt issued HEP Person educated: Patient Education method: Explanation, Demonstration, and Handouts Education comprehension: verbalized understanding and returned demonstration  HOME EXERCISE PROGRAM: Access Code: [email or web address] URL: [email or web address] Date: 05/11/2022 Prepared by: Shelby Dubin Yong Wahlquist  Exercises - Pencil Pushups  - 2 x daily - 7 x weekly - 2 sets - 10 reps - Seated Vertical Smooth Pursuit  - 2 x daily - 7 x weekly - 2 sets - 10 reps - Seated Diagonal Smooth Pursuit  - 2 x daily - 7 x weekly - 2 sets - 10 reps - Seated Horizontal Smooth Pursuit  - 2 x daily - 7 x weekly - 2 sets - 10 reps - Seated Gaze Stabilization with Head Rotation  - 1 x daily - 7 x weekly - 3 sets - 10 reps - Seated Gaze Stabilization with Head Nod  - 1 x daily - 7 x weekly - 3 sets - 10 reps - Seated Cervical Retraction  - 1 x daily - 7 x weekly - 2 sets - 10 reps - Seated Scapular Retraction  - 1 x daily - 7 x weekly - 2 sets - 10 reps - Seated Upper Trapezius Stretch  - 1 x daily - 7 x weekly - 1 sets - 2 reps - 20 sec hold - Seated Assisted Cervical Rotation with Towel  - 1 x daily - 7 x weekly - 2 sets - 10 reps - Cervical Extension AROM with Strap  - 1 x daily - 7 x weekly - 2 sets - 10 reps - Shoulder External Rotation and Scapular Retraction with Resistance  - 1 x daily - 7 x weekly - 2 sets - 10 reps - Standing Shoulder Horizontal Abduction with Resistance  - 1 x daily - 7 x weekly - 2 sets - 10 reps - Standing Shoulder Row with Anchored Resistance  - 1 x daily - 7 x weekly - 2 sets - 10 reps  GOALS: Goals reviewed with patient? Yes  SHORT TERM GOALS: Target date: 04/11/2022   Pt will be independent with initial HEP. Baseline: Goal status: MET  2.  Pt will report at least a 30% improvement in dizziness. Baseline:  Goal status: MET   LONG TERM GOALS: Target date: 07/06/2022    Pt  will be independent with  advanced HEP. Baseline:  Goal status: IN PROGRESS  2.  Pt will improve Dizziness Handicapped Inventory to no greater than 50% to demonstrate pts improved functional independence. Baseline: 68/100 Goal status: MET on 05/14/2022  3.  Pt will increase hip and knee strength to at least 4+/5 to allow him to perform transfers with improved ease. Baseline:  Goal status: IN PROGRESS  4.  Pt will be able to ambulate at least 15 minutes with RPE no greater than 5/10 and without a loss of balance. Baseline: RPE of 5/10 with TUG (on 05/11/22, RPE of 8-9/10 with 3 minute walk test) Goal status: IN PROGRESS   ASSESSMENT:  CLINICAL IMPRESSION: Mr Caudillo presents to skilled PT he is progressing towards overall decreased balance, requested to continue with strengthening.  Patient is progressing towards continued improved improved strengthening.  Patient continues to progress with increasing activity tolerance. Patient without any complaints of dizziness throughout session.    OBJECTIVE IMPAIRMENTS: Abnormal gait, decreased activity tolerance, decreased balance, difficulty walking, decreased strength, dizziness, impaired flexibility, postural dysfunction, obesity, and pain.   ACTIVITY LIMITATIONS: bending, standing, squatting, stairs, locomotion level, and caring for others  PARTICIPATION LIMITATIONS: cleaning and community activity  PERSONAL FACTORS: 3+ comorbidities: obesity, HTN, pre-diabetes, obstructive sleep apnea, cervical radiculopathy  are also affecting patient's functional outcome.   REHAB POTENTIAL: Good  CLINICAL DECISION MAKING: Evolving/moderate complexity  EVALUATION COMPLEXITY: Moderate   PLAN:  PT FREQUENCY: 1-2x/week  PT DURATION: 8 weeks  PLANNED INTERVENTIONS: Therapeutic exercises, Therapeutic activity, Neuromuscular re-education, Balance training, Gait training, Patient/Family education, Self Care, Joint mobilization, Joint manipulation, Stair training, Vestibular  training, Canalith repositioning, Aquatic Therapy, Dry Needling, Electrical stimulation, Spinal manipulation, Spinal mobilization, Cryotherapy, Moist heat, Taping, Ultrasound, Ionotophoresis '4mg'$ /ml Dexamethasone, Manual therapy, and Re-evaluation  PLAN FOR NEXT SESSION: assess and progress HEP, vestibular rehab, strengthening, Marye Round  and canalith repositioning as indicated   Juel Burrow, PT, DPT 05/22/2022, 1:26 PM   [facility] [address], [place], [place] [address] Phone # [phone] Fax [phone]

## 2022-05-25 ENCOUNTER — Telehealth: Payer: Self-pay | Admitting: *Deleted

## 2022-05-25 ENCOUNTER — Encounter: Payer: Self-pay | Admitting: *Deleted

## 2022-05-25 NOTE — Patient Outreach (Signed)
  Care Coordination   Initial Visit Note   05/25/2022 Name: James [name] MRN: [id number] DOB: [birth year]  James [name] is a 67 y.o. year old male who sees Nafziger, Tommi Rumps, NP for primary care. I spoke with  [name] by phone today.  What matters to the patients health and wellness today?  No needs    Goals Addressed             This Visit's Progress    COMPLETED: Care coordination activity       Care Coordination Interventions: Reviewed medications with patient and discussed adherence with no needed refills Reviewed scheduled/upcoming provider appointments including sufficient transportation source Screening for signs and symptoms of depression related to chronic disease state  Assessed social determinant of health barriers Educated on care management services with no acute needs at this time         SDOH assessments and interventions completed:  Yes  SDOH Interventions Today    Flowsheet Row Most Recent Value  SDOH Interventions   Food Insecurity Interventions Intervention Not Indicated  Housing Interventions Intervention Not Indicated  Transportation Interventions Intervention Not Indicated  Utilities Interventions Intervention Not Indicated        Care Coordination Interventions:  Yes, provided   Follow up plan: No further intervention required.   Encounter Outcome:  Pt. Visit Completed   Raina Mina, RN Care Management Coordinator [place] Office [phone]

## 2022-05-25 NOTE — Patient Instructions (Signed)
Visit Information  Thank you for taking time to visit with me today. Please don't hesitate to contact me if I can be of assistance to you.   Following are the goals we discussed today:   Goals Addressed             This Visit's Progress    COMPLETED: Care coordination activity       Care Coordination Interventions: Reviewed medications with patient and discussed adherence with no needed refills Reviewed scheduled/upcoming provider appointments including sufficient transportation source Screening for signs and symptoms of depression related to chronic disease state  Assessed social determinant of health barriers Educated on care management services with no acute needs at this time         Please call the care guide team at [phone] if you need to cancel or reschedule your appointment.   If you are experiencing a Mental Health or [place] or need someone to talk to, please call the Suicide and Crisis Lifeline: 988  Patient verbalizes understanding of instructions and care plan provided today and agrees to view in [place]. Active MyChart status and patient understanding of how to access instructions and care plan via MyChart confirmed with patient.     No further follow up required: No needs  Raina Mina, RN Care Management Coordinator [place] Office [phone]

## 2022-05-29 ENCOUNTER — Encounter: Payer: Self-pay | Admitting: Rehabilitative and Restorative Service Providers"

## 2022-05-29 ENCOUNTER — Ambulatory Visit: Payer: HMO | Admitting: Rehabilitative and Restorative Service Providers"

## 2022-05-29 DIAGNOSIS — M542 Cervicalgia: Secondary | ICD-10-CM

## 2022-05-29 DIAGNOSIS — R42 Dizziness and giddiness: Secondary | ICD-10-CM | POA: Diagnosis not present

## 2022-05-29 DIAGNOSIS — M6281 Muscle weakness (generalized): Secondary | ICD-10-CM

## 2022-05-29 DIAGNOSIS — R2689 Other abnormalities of gait and mobility: Secondary | ICD-10-CM

## 2022-05-29 DIAGNOSIS — R252 Cramp and spasm: Secondary | ICD-10-CM

## 2022-05-29 NOTE — Therapy (Signed)
OUTPATIENT PHYSICAL THERAPY TREATMENT NOTE     Patient Name: James [name] MRN: [id number] DOB:1955/09/30, 67 y.o., male Today's Date: 05/29/2022        PT End of Session - 05/29/22 1233     Visit Number 13    Date for PT Re-Evaluation 07/06/22    Authorization Type [employer]    Progress Note Due on Visit 20    PT Start Time 1230    PT Stop Time 1310    PT Time Calculation (min) 40 min    Activity Tolerance Patient tolerated treatment well    Behavior During Therapy WFL for tasks assessed/performed             Past Medical History:  Diagnosis Date   Arthritis    Bilateral swelling of feet    Chicken pox    Complication of anesthesia    difficulty waking up   Depression    Hypertension    Joint pain    Obesity    Osteoarthritis    Prediabetes    Shortness of breath    Swelling    feet and legs   Vitamin D deficiency    Past Surgical History:  Procedure Laterality Date   cartilage removal     COLONOSCOPY WITH PROPOFOL N/A 12/14/2019   Procedure: COLONOSCOPY WITH PROPOFOL;  Surgeon: Doran Stabler, MD;  Location: [facility];  Service: Gastroenterology;  Laterality: N/A;   KNEE SURGERY  2000   POLYPECTOMY  12/14/2019   Procedure: POLYPECTOMY;  Surgeon: Doran Stabler, MD;  Location: [facility];  Service: Gastroenterology;;   TOTAL HIP ARTHROPLASTY Left 02/11/2019   Procedure: TOTAL HIP [place];  Surgeon: Gaynelle Arabian, MD;  Location: [facility];  Service: Orthopedics;  Laterality: Left;  136mn   Patient Active Problem List   Diagnosis Date Noted   Depression 09/08/2020   Special screening for malignant neoplasms, colon    Benign neoplasm of rectum    OA (osteoarthritis) of hip 02/11/2019   Other hyperlipidemia 04/10/2017   Vitamin D deficiency 04/10/2017   Prediabetes 12/17/2016   Class 3 severe obesity with serious comorbidity and body mass index (BMI) of 50.0 to 59.9 in adult ([facility] 12/17/2016   At risk for diabetes  mellitus 10/02/2016   Hyperglycemia 09/04/2016   Severe obesity (BMI >= 40) ([facility] 01/15/2013   OBESITY, MORBID 01/20/2007   Essential hypertension 10/31/2006    PCP: NDorothyann Peng NP REFERRING PROVIDER: NDorothyann Peng NP  REFERRING DIAG: R42 (ICD-10-CM) - Vertigo  THERAPY DIAG:  Dizziness and giddiness  Other abnormalities of gait and mobility  Muscle weakness (generalized)  Cervicalgia  Cramp and spasm  ONSET DATE: At least a couple of months  Rationale for Evaluation and Treatment: Rehabilitation  SUBJECTIVE:   SUBJECTIVE STATEMENT: Pt states that his dizziness seems to be improved.  States that he is having more leg pain today.  Pt accompanied by: self  PERTINENT HISTORY: pre-diabetes, HTN, cervical radiculopathy, OSA  PAIN:  Are you having pain? Yes: NPRS scale: 5-9/10 Pain location: knees, back and hips Pain description: sharp Aggravating factors: activity Relieving factors: ice, medication  PRECAUTIONS: Fall  WEIGHT BEARING RESTRICTIONS: No  FALLS: Has patient fallen in last 6 months? No  LIVING ENVIRONMENT: Lives with: lives with their spouse lives with wife who is primarily w/c bound and needs assistance with most daily activities Lives in: House/apartment Stairs:  2 story home, but they live on one level.  They have a platform lift to help  get to driveway. Has following equipment at home: Single point cane and Grab bars  PLOF: Independent and Leisure: watching tv and sports  PATIENT GOALS: To be able to function again and get rid of the dizziness.  OBJECTIVE:   DIAGNOSTIC FINDINGS: Unremarkable carotid Doppler ultrasound   COGNITION: Overall cognitive status: Within functional limits for tasks assessed   SENSATION: Reports numbness and tingling down both arms   POSTURE:  rounded shoulders and forward head  Cervical ROM:    Active A/ROM (deg) eval A/ROM (deg) 05/14/22  Flexion 60 60  Extension 20 20  Right lateral flexion 40 35   Left lateral flexion 30 35  Right rotation 50 60  Left rotation 40 60  (Blank rows = not tested)  STRENGTH:   Eval: BUE strength is WFL Bilateral hips 4/5 Right quad/hamstring 4/5  05/14/2022: Bilateral hips 4 to 4+/5 Right quad/hamstring 4 to 4+/5   GAIT: Gait pattern: decreased stride length, antalgic, and wide BOS Distance walked: 100 ft Assistive device utilized: Single point cane Level of assistance: Modified independence Comments: Pt with dyspnea on exertion and increased pain  FUNCTIONAL TESTS:  Eval: Timed up and go (TUG): 19.3 sec without assistive device, RPE of 5/10  04/02/2022: Dynamic Visual Acuity:  Can read 20/20 on chart normally, with fast horizontal head movements can read 20/50 (difference of 4 lines)  04/12/2022: 3 minute walk test:  412 ft with RPE of 8/10  05/11/2022: 3 minute walk test:  445 ft with RPE of 8-9/10  05/14/2022: Dynamic Visual Acuity:  Can read 20/20 on chart normally, with fast horizontal head movements can read 20/30 (difference of 2 lines) Timed up and go (TUG): 12.6 sec without assistive device, RPE of 2/10 5 times sit to/from stand:  18.4 sec without UE  PATIENT SURVEYS:  Eval: DHI 68 / 100 Physical Score: 18 / 28 Emotional Score: 14 / 36 Functional Score: 36 / 36  04/23/2022: DHI Total Score: 64 / 100 Physical Score: 18 / 28 Emotional Score: 12 / 36 Functional Score: 34 / 36  05/14/2022: DHI Total Score: 12 / 100 Physical Score: 4 / 28 Emotional Score: 2 / 36 Functional Score: 6 / 36  VESTIBULAR ASSESSMENT:  SYMPTOM BEHAVIOR:  Subjective history: Pt reports having dizziness for at least 2 months  Non-Vestibular symptoms: changes in hearing, changes in vision, neck pain, headaches, and nausea/vomiting  Type of dizziness: Blurred Vision, Imbalance (Disequilibrium), and Spinning/Vertigo  Frequency: daily  Duration: varies, but sometimes more than a couple of hours  Aggravating factors: Induced by motion: occur when  walking, looking up at the ceiling, bending down to the ground, driving, and activity in general and "any type of exertion"  Relieving factors: head stationary and closing eyes  Progression of symptoms: unchanged  OCULOMOTOR EXAM:  Ocular Alignment: normal  Ocular ROM: No Limitations  Spontaneous Nystagmus: absent  Gaze-Induced Nystagmus: absent  Smooth Pursuits:  occasional nystagmus noted with tracking towards right side  Saccades: slow   VESTIBULAR - OCULAR REFLEX:   Positive head thrust   POSITIONAL TESTING: Right Dix-Hallpike: no nystagmus Left Dix-Hallpike: no nystagmus    OTHOSTATICS: not done   VESTIBULAR TREATMENT:  DATE: 05/29/2022 Nustep level 3 x6 min with PT present to discuss status Seated hip clamshell with green tband 2x10 Seated hamstring curl with green tband 2x10 bilat Seated shoulder ER and horizontal abduction with green tband 2x10 each Seated with 1#:  LAQ, hip side step/back.  2x10 bilat each Standing rows and shoulder extension with green tband 2x10 each bilat Sit to/from stand 2x5   DATE: 05/22/2022 Cervical SNAGs for rotation and extension with use of towel 2x10 each Nustep level 3 x6 min with PT present to discuss status Seated LAQ with 2 sec hold 2x10 bilat Seated shoulder ER and horizontal abduction with green tband 2x10 each Seated hip clamshell with green tband 2x10 Seated hamstring curl with green tband 2x10 bilat Sit to/from stand 2x5   DATE: 05/16/2022 Nustep level 3 x6 min with PT present to discuss status Cervical SNAGs for rotation and extension with use of towel 2x10 each Sit to/from stand 2x5 Seated shoulder ER and horizontal abduction with green tband 2x10 each Seated LAQ with 2 sec hold 2x10 bilat Seated hip clamshell with green tband 2x10 Seated hamstring curl with green tband 2x10 bilat    PATIENT EDUCATION: Education  details: Pt issued HEP Person educated: Patient Education method: Explanation, Demonstration, and Handouts Education comprehension: verbalized understanding and returned demonstration  HOME EXERCISE PROGRAM: Access Code: [email or web address] URL: [email or web address] Date: 05/11/2022 Prepared by: Shelby Dubin Sephora Boyar  Exercises - Pencil Pushups  - 2 x daily - 7 x weekly - 2 sets - 10 reps - Seated Vertical Smooth Pursuit  - 2 x daily - 7 x weekly - 2 sets - 10 reps - Seated Diagonal Smooth Pursuit  - 2 x daily - 7 x weekly - 2 sets - 10 reps - Seated Horizontal Smooth Pursuit  - 2 x daily - 7 x weekly - 2 sets - 10 reps - Seated Gaze Stabilization with Head Rotation  - 1 x daily - 7 x weekly - 3 sets - 10 reps - Seated Gaze Stabilization with Head Nod  - 1 x daily - 7 x weekly - 3 sets - 10 reps - Seated Cervical Retraction  - 1 x daily - 7 x weekly - 2 sets - 10 reps - Seated Scapular Retraction  - 1 x daily - 7 x weekly - 2 sets - 10 reps - Seated Upper Trapezius Stretch  - 1 x daily - 7 x weekly - 1 sets - 2 reps - 20 sec hold - Seated Assisted Cervical Rotation with Towel  - 1 x daily - 7 x weekly - 2 sets - 10 reps - Cervical Extension AROM with Strap  - 1 x daily - 7 x weekly - 2 sets - 10 reps - Shoulder External Rotation and Scapular Retraction with Resistance  - 1 x daily - 7 x weekly - 2 sets - 10 reps - Standing Shoulder Horizontal Abduction with Resistance  - 1 x daily - 7 x weekly - 2 sets - 10 reps - Standing Shoulder Row with Anchored Resistance  - 1 x daily - 7 x weekly - 2 sets - 10 reps  GOALS: Goals reviewed with patient? Yes  SHORT TERM GOALS: Target date: 04/11/2022   Pt will be independent with initial HEP. Baseline: Goal status: MET  2.  Pt will report at least a 30% improvement in dizziness. Baseline:  Goal status: MET   LONG TERM GOALS: Target date: 07/06/2022    Pt will be independent with advanced HEP. Baseline:  Goal status: IN PROGRESS  2.  Pt will  improve Dizziness Handicapped Inventory to no greater than 50% to demonstrate pts improved functional independence. Baseline: 68/100 Goal status: MET on 05/14/2022  3.  Pt will increase hip and knee strength to at least 4+/5 to allow him to perform transfers with improved ease. Baseline:  Goal status: IN PROGRESS  4.  Pt will be able to ambulate at least 15 minutes with RPE no greater than 5/10 and without a loss of balance. Baseline: RPE of 5/10 with TUG (on 05/11/22, RPE of 8-9/10 with 3 minute walk test) Goal status: IN PROGRESS   ASSESSMENT:  CLINICAL IMPRESSION: Mr Gorelik presents to skilled PT with increased LE pain reported today.  Patient able to progress with LE strength with weights.  Patient able to use weights on LE during exercises today.  Patient limited some today secondary to increased pain, but still able to complete exercises.     OBJECTIVE IMPAIRMENTS: Abnormal gait, decreased activity tolerance, decreased balance, difficulty walking, decreased strength, dizziness, impaired flexibility, postural dysfunction, obesity, and pain.   ACTIVITY LIMITATIONS: bending, standing, squatting, stairs, locomotion level, and caring for others  PARTICIPATION LIMITATIONS: cleaning and community activity  PERSONAL FACTORS: 3+ comorbidities: obesity, HTN, pre-diabetes, obstructive sleep apnea, cervical radiculopathy  are also affecting patient's functional outcome.   REHAB POTENTIAL: Good  CLINICAL DECISION MAKING: Evolving/moderate complexity  EVALUATION COMPLEXITY: Moderate   PLAN:  PT FREQUENCY: 1-2x/week  PT DURATION: 8 weeks  PLANNED INTERVENTIONS: Therapeutic exercises, Therapeutic activity, Neuromuscular re-education, Balance training, Gait training, Patient/Family education, Self Care, Joint mobilization, Joint manipulation, Stair training, Vestibular training, Canalith repositioning, Aquatic Therapy, Dry Needling, Electrical stimulation, Spinal manipulation, Spinal  mobilization, Cryotherapy, Moist heat, Taping, Ultrasound, Ionotophoresis '4mg'$ /ml Dexamethasone, Manual therapy, and Re-evaluation  PLAN FOR NEXT SESSION: assess and progress HEP, vestibular rehab, strengthening, [employer]  and canalith repositioning as indicated   Juel Burrow, PT, DPT 05/29/2022, 1:32 PM   [facility] [address], [place], [place] [address] Phone # [phone] Fax [phone]

## 2022-06-01 ENCOUNTER — Ambulatory Visit (INDEPENDENT_AMBULATORY_CARE_PROVIDER_SITE_OTHER): Payer: PPO

## 2022-06-01 VITALS — Ht 64.0 in | Wt 357.0 lb

## 2022-06-01 DIAGNOSIS — Z0000 Encounter for general adult medical examination without abnormal findings: Secondary | ICD-10-CM | POA: Diagnosis not present

## 2022-06-01 NOTE — Progress Notes (Signed)
Subjective:   James [name] is a 67 y.o. male who presents for Medicare Annual/Subsequent preventive examination.  Review of Systems    Virtual Visit via Telephone Note  I connected with  James [name] on 06/04/22 at  3:45 PM EST by telephone and verified that I am speaking with the correct person using two identifiers.  Location: Patient: Home  Provider: Office Persons participating in the virtual visit: patient/Nurse Health Advisor   I discussed the limitations, risks, security and privacy concerns of performing an evaluation and management service by telephone and the availability of in person appointments. The patient expressed understanding and agreed to proceed.  Interactive audio and video telecommunications were attempted between this nurse and patient, however failed, due to patient having technical difficulties OR patient did not have access to video capability.  We continued and completed visit with audio only.  Some vital signs may be absent or patient reported.   Criselda Peaches, LPN  Cardiac Risk Factors include: advanced age (>17mn, >>51women);diabetes mellitus;male gender;hypertension;obesity (BMI >30kg/m2)     Objective:    Today's Vitals   06/01/22 1551  Weight: (!) 357 lb (161.9 kg)  Height: '5\' 4"'$  (1.626 m)   Body mass index is 61.28 kg/m.     06/01/2022    4:04 PM 03/21/2022    8:05 AM 05/26/2021    9:23 AM 05/31/2020    8:55 AM 12/14/2019    8:28 AM 02/11/2019    3:30 PM 02/04/2019   11:16 AM  Advanced Directives  Does Patient Have a Medical Advance Directive? No Yes Yes Yes Yes Yes Yes  Type of Advance Directive  Living will  [place] will [place] will  Does patient want to make changes to medical advance directive?  No - Patient declined No - Patient declined No - Patient declined  No - Guardian declined No -  Guardian declined  Copy of [place] Chart?    No - copy requested No - copy requested No - copy requested No - copy requested  Would patient like information on creating a medical advance directive? No - Patient declined          Current Medications (verified) Outpatient Encounter Medications as of 06/01/2022  Medication Sig   acetaminophen (TYLENOL) 500 MG tablet Take 1,000 mg by mouth at bedtime as needed for moderate pain.   amoxicillin (AMOXIL) 500 MG capsule Take 2,000 mg by mouth See admin instructions. Take 2000 mg 1 hour prior to dental work   cetirizine (ZYRTEC) 10 MG tablet Take 10 mg by mouth daily.   cholecalciferol (VITAMIN D3) 25 MCG (1000 UNIT) tablet Take 2,000 Units by mouth daily.   fluticasone (FLONASE) 50 MCG/ACT nasal spray Place 1 spray into both nostrils daily.    hydrochlorothiazide (HYDRODIURIL) 25 MG tablet TAKE 1 TABLET (25 MG TOTAL) BY MOUTH DAILY.   ibuprofen (ADVIL) 200 MG tablet Take 400 mg by mouth at bedtime as needed for moderate pain.   Ketotifen Fumarate (ALLERGY EYE DROPS OP) Place 1 drop into both eyes daily as needed (allergies).   lidocaine (LMX) 4 % cream Apply 1 application topically daily as needed (pain).   lisinopril (ZESTRIL) 10 MG tablet TAKE 2 TABLETS BY MOUTH EVERY DAY   Multiple Vitamin (MULTIVITAMIN) tablet Take 1 tablet by mouth daily.   No facility-administered encounter medications on file as of 06/01/2022.  Allergies (verified) Patient has no known allergies.   History: Past Medical History:  Diagnosis Date   Arthritis    Bilateral swelling of feet    Chicken pox    Complication of anesthesia    difficulty waking up   Depression    Hypertension    Joint pain    Obesity    Osteoarthritis    Prediabetes    Shortness of breath    Swelling    feet and legs   Vitamin D deficiency    Past Surgical History:  Procedure Laterality Date   cartilage removal     COLONOSCOPY WITH PROPOFOL N/A 12/14/2019    Procedure: COLONOSCOPY WITH PROPOFOL;  Surgeon: Doran Stabler, MD;  Location: [facility];  Service: Gastroenterology;  Laterality: N/A;   KNEE SURGERY  2000   POLYPECTOMY  12/14/2019   Procedure: POLYPECTOMY;  Surgeon: Doran Stabler, MD;  Location: [facility];  Service: Gastroenterology;;   TOTAL HIP ARTHROPLASTY Left 02/11/2019   Procedure: TOTAL HIP [place];  Surgeon: Gaynelle Arabian, MD;  Location: [facility];  Service: Orthopedics;  Laterality: Left;  126mn   Family History  Problem Relation Age of Onset   Cancer Mother        lung and soft tissue cancer from radiation   Stroke Mother    Obesity Mother    Heart disease Father        CHF   Diabetes Father    Hypertension Father    Obesity Father    Heart disease Maternal Grandfather    Diabetes Paternal Grandmother    Ovarian cancer Sister    Colon cancer Neg Hx    Colon polyps Neg Hx    Esophageal cancer Neg Hx    Rectal cancer Neg Hx    Stomach cancer Neg Hx    Social History   Socioeconomic History   Marital status: Married    Spouse name: Not on file   Number of children: Not on file   Years of education: Not on file   Highest education level: Not on file  Occupational History   Occupation: At home caregiver  Tobacco Use   Smoking status: Former    Types: Cigarettes    Quit date: 07/16/1992    Years since quitting: 29.9   Smokeless tobacco: Never  Substance and Sexual Activity   Alcohol use: Yes    Comment: very little per patient    Drug use: No   Sexual activity: Not on file  Other Topics Concern   Not on file  Social History Narrative   Work or School: self employed, 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      Home Situation: living with and caring for wife whom had stroke and brain tumor      Spiritual Beliefs: Methodist      Lifestyle: no regular exercise, diet poor            Social Determinants of Health   Financial Resource Strain: Low Risk  (06/01/2022)   Overall Financial  Resource Strain (CARDIA)    Difficulty of Paying Living Expenses: Not hard at all  Food Insecurity: No Food Insecurity (06/01/2022)   Hunger Vital Sign    Worried About Running Out of Food in the Last Year: Never true    Ran Out of Food in the Last Year: Never true  Transportation Needs: No Transportation Needs (06/01/2022)   PRAPARE - THydrologist(Medical): No  Lack of Transportation (Non-Medical): No  Physical Activity: Insufficiently Active (06/01/2022)   Exercise Vital Sign    Days of Exercise per Week: 1 day    Minutes of Exercise per Session: 40 min  Stress: No Stress Concern Present (06/01/2022)   [place]    Feeling of Stress : Not at all  Social Connections: [place] (06/01/2022)   Social Connection and Isolation Panel [NHANES]    Frequency of Communication with Friends and Family: More than three times a week    Frequency of Social Gatherings with Friends and Family: More than three times a week    Attends Religious Services: 1 to 4 times per year    Active Member of [employer] or Organizations: Yes    Attends Music therapist: More than 4 times per year    Marital Status: Married    Tobacco Counseling Counseling given: Not Answered   Clinical Intake:  Pre-visit preparation completed: No  Pain : No/denies pain     BMI - recorded: 61.28 Nutritional Status: BMI > 30  Obese Nutritional Risks: None Diabetes: No  How often do you need to have someone help you when you read instructions, pamphlets, or other written materials from your doctor or pharmacy?: 1 - Never  Diabetic?  No  Interpreter Needed?: No  Information entered by :: Rolene Arbour LPN   Activities of Daily Living    06/01/2022    3:59 PM 05/29/2022   11:19 AM  In your present state of health, do you have any difficulty performing the following activities:  Hearing? 0 0  Vision? 0  0  Difficulty concentrating or making decisions? 0 0  Walking or climbing stairs? 0 1  Comment Uses [employer] or bathing? 0 1  Doing errands, shopping? 0 0  Preparing Food and eating ? N Y  Using the Toilet? N N  In the past six months, have you accidently leaked urine? Y Y  Comment Wears  breifsfollowed by PCP   Do you have problems with loss of bowel control? N Y  Managing your Medications? N N  Managing your Finances? N N  Housekeeping or managing your Housekeeping? Aggie Moats    Patient Care Team: Dorothyann Peng, NP as PCP - General (Family Medicine) Gaynelle Arabian, MD as Consulting Physician (Orthopedic Surgery)  Indicate any recent Medical Services you may have received from other than [facility] providers in the past year (date may be approximate).     Assessment:   This is a routine wellness examination for James [name].  Hearing/Vision screen Hearing Screening - Comments:: Denies hearing difficulties   Vision Screening - Comments:: Wears rx glasses - up to date with routine eye exams with  Dr Herbert Deaner  Dietary issues and exercise activities discussed: Current Exercise Habits: Structured exercise class, Type of exercise: Other - see comments (PT exercises), Time (Minutes): 40, Frequency (Times/Week): 1, Weekly Exercise (Minutes/Week): 40, Intensity: Moderate, Exercise limited by: orthopedic condition(s)   Goals Addressed               This Visit's Progress     Patient stated (pt-stated)        I want to at least walk 1/2 mile per day.       Depression Screen    06/01/2022    3:59 PM 06/01/2022    3:58 PM 05/25/2022    3:01 PM 02/23/2022    1:53 PM 09/08/2021  9:31 AM 05/26/2021    9:11 AM 05/31/2020    8:57 AM  PHQ 2/9 Scores  PHQ - 2 Score 0 0 0 0 0 0 3  PHQ- 9 Score       13    Fall Risk    06/01/2022    4:02 PM 05/29/2022   11:19 AM 02/23/2022    1:52 PM 09/06/2021    9:25 AM 05/26/2021    9:15 AM  Fall Risk   Falls in the past year? 1 0 '1 1 1   '$ Number falls in past yr: 1 0 '1 1 1  '$ Injury with Fall? 0 0 0 0 0  Comment     Fall w/o injury or medical attention needed  Risk for fall due to : Orthopedic patient;Impaired balance/gait  History of fall(s)    Follow up Falls prevention discussed  Falls evaluation completed      FALL RISK PREVENTION PERTAINING TO THE HOME:  Any stairs in or around the home? Yes  If so, are there any without handrails? No  Home free of loose throw rugs in walkways, pet beds, electrical cords, etc? Yes  Adequate lighting in your home to reduce risk of falls? Yes   ASSISTIVE DEVICES UTILIZED TO PREVENT FALLS:  Life alert? No  Use of a cane, walker or w/c? Yes  Grab bars in the bathroom? Yes  Shower chair or bench in shower? No  Elevated toilet seat or a handicapped toilet? No   TIMED UP AND GO:  Was the test performed? No . Audio Visit  Cognitive Function:        06/01/2022    4:04 PM 05/26/2021    9:21 AM  6CIT Screen  What Year? 0 points 0 points  What month? 0 points 0 points  What time? 0 points 0 points  Count back from 20 0 points 0 points  Months in reverse 0 points 0 points  Repeat phrase 0 points 0 points  Total Score 0 points 0 points    Immunizations Immunization History  Administered Date(s) Administered   Fluad Quad(high Dose 65+) 01/13/2019, 01/19/2020, 02/23/2021   Influenza Inj Mdck Quad Pf 01/27/2018, 01/27/2022   Influenza,inj,Quad PF,6+ Mos 01/15/2013, 05/17/2015, 01/10/2016   Influenza,inj,quad, With Preservative 01/27/2018   Influenza-Unspecified 05/13/2014, 02/24/2017   PFIZER Comirnaty(Gray Top)Covid-19 Tri-Sucrose Vaccine 08/30/2020   PFIZER(Purple Top)SARS-COV-2 Vaccination 06/12/2019, 07/08/2019, 02/16/2020, 08/30/2020   Pfizer Covid-19 Vaccine Bivalent Booster 75yr & up 05/30/2021   Pneumococcal Conjugate-13 01/13/2019   Pneumococcal Polysaccharide-23 01/19/2020   Td 05/07/2005   Tdap 05/17/2015   Unspecified SARS-COV-2 Vaccination 01/27/2022   Zoster  Recombinat (Shingrix) 05/30/2021, 07/31/2021    TDAP status: Up to date  Flu Vaccine status: Up to date  Pneumococcal vaccine status: Up to date  Covid-19 vaccine status: Completed vaccines  Qualifies for Shingles Vaccine? Yes   Zostavax completed Yes   Shingrix Completed?: Yes  Screening Tests Health Maintenance  Topic Date Due   COVID-19 Vaccine (8 - 2023-24 season) 06/17/2022 (Originally 03/24/2022)   [place]  11/30/2022 (Originally 12/13/2020)   Hepatitis C Screening  09/12/2025 (Originally 12/09/1973)   Fecal DNA (Cologuard)  10/15/2022   Medicare Annual Wellness (AWV)  06/02/2023   DTaP/Tdap/Td (3 - Td or Tdap) 05/16/2025   Pneumonia Vaccine 67 Years old  Completed   INFLUENZA VACCINE  Completed   Zoster Vaccines- Shingrix  Completed   HPV VACCINES  Aged Out    Health Maintenance  There are no  preventive care reminders to display for this patient.   Colorectal cancer screening: Referral to GI placed Deferred. Pt aware the office will call re: appt.  Lung Cancer Screening: (Low Dose CT Chest recommended if Age 57-80 years, 30 pack-year currently smoking OR have quit w/in 15years.) does not qualify.     Additional Screening:  Hepatitis C Screening: does qualify; Completed   Vision Screening: Recommended annual ophthalmology exams for early detection of glaucoma and other disorders of the eye. Is the patient up to date with their annual eye exam?  Yes  Who is the provider or what is the name of the office in which the patient attends annual eye exams? Dr Laban Emperor If pt is not established with a provider, would they like to be referred to a provider to establish care? No .   Dental Screening: Recommended annual dental exams for proper oral hygiene  Community Resource Referral / Chronic Care Management:  CRR required this visit?  No   CCM required this visit?  No      Plan:     I have personally reviewed and noted the following in  the patient's chart:   Medical and social history Use of alcohol, tobacco or illicit drugs  Current medications and supplements including opioid prescriptions. Patient is not currently taking opioid prescriptions. Functional ability and status Nutritional status Physical activity Advanced directives List of other physicians Hospitalizations, surgeries, and ER visits in previous 12 months Vitals Screenings to include cognitive, depression, and falls Referrals and appointments  In addition, I have reviewed and discussed with patient certain preventive protocols, quality metrics, and best practice recommendations. A written personalized care plan for preventive services as well as general preventive health recommendations were provided to patient.     Criselda Peaches, LPN   [id number]   Nurse Notes:

## 2022-06-01 NOTE — Patient Instructions (Addendum)
[name] , Thank you for taking time to come for your Medicare Wellness Visit. I appreciate your ongoing commitment to your health goals. Please review the following plan we discussed and let me know if I can assist you in the future.   These are the goals we discussed:  Goals       Exercise 150 min/wk Moderate Activity      Patient wants to continue to exercise.(Walking 3 days per week X 30 min.)      Patient stated (pt-stated)      I want to at least walk 1/2 mile per day.        This is a list of the screening recommended for you and due dates:  Health Maintenance  Topic Date Due   COVID-19 Vaccine (8 - 2023-24 season) 06/17/2022*   Stool Blood Test  11/30/2022*   Hepatitis C Screening: USPSTF Recommendation to screen - Ages 18-79 yo.  09/12/2025*   Cologuard (Stool DNA test)  10/15/2022   Medicare Annual Wellness Visit  06/02/2023   DTaP/Tdap/Td vaccine (3 - Td or Tdap) 05/16/2025   Pneumonia Vaccine  Completed   Flu Shot  Completed   Zoster (Shingles) Vaccine  Completed   HPV Vaccine  Aged Out  *Topic was postponed. The date shown is not the original due date.    Advanced directives: Advance directive discussed with you today. Even though you declined this today, please call our office should you change your mind, and we can give you the proper paperwork for you to fill out.   Conditions/risks identified: None  Next appointment: Follow up in one year for your annual wellness visit.    Preventive Care 3 Years and Older, Male  Preventive care refers to lifestyle choices and visits with your health care provider that can promote health and wellness. What does preventive care include? A yearly physical exam. This is also called an annual well check. Dental exams once or twice a year. Routine eye exams. Ask your health care provider how often you should have your eyes checked. Personal lifestyle choices, including: Daily care of your teeth and gums. Regular  physical activity. Eating a healthy diet. Avoiding tobacco and drug use. Limiting alcohol use. Practicing safe sex. Taking low doses of aspirin every day. Taking vitamin and mineral supplements as recommended by your health care provider. What happens during an annual well check? The services and screenings done by your health care provider during your annual well check will depend on your age, overall health, lifestyle risk factors, and family history of disease. Counseling  Your health care provider may ask you questions about your: Alcohol use. Tobacco use. Drug use. Emotional well-being. Home and relationship well-being. Sexual activity. Eating habits. History of falls. Memory and ability to understand (cognition). Work and work Statistician. Screening  You may have the following tests or measurements: Height, weight, and BMI. Blood pressure. Lipid and cholesterol levels. These may be checked every 5 years, or more frequently if you are over 3 years old. Skin check. Lung cancer screening. You may have this screening every year starting at age 84 if you have a 30-pack-year history of smoking and currently smoke or have quit within the past 15 years. Fecal occult blood test (FOBT) of the stool. You may have this test every year starting at age 63. Flexible sigmoidoscopy or colonoscopy. You may have a sigmoidoscopy every 5 years or a colonoscopy every 10 years starting at age 52. Prostate cancer screening. Recommendations will  vary depending on your family history and other risks. Hepatitis C blood test. Hepatitis B blood test. Sexually transmitted disease (STD) testing. Diabetes screening. This is done by checking your blood sugar (glucose) after you have not eaten for a while (fasting). You may have this done every 1-3 years. Abdominal aortic aneurysm (AAA) screening. You may need this if you are a current or former smoker. Osteoporosis. You may be screened starting at age 81  if you are at high risk. Talk with your health care provider about your test results, treatment options, and if necessary, the need for more tests. Vaccines  Your health care provider may recommend certain vaccines, such as: Influenza vaccine. This is recommended every year. Tetanus, diphtheria, and acellular pertussis (Tdap, Td) vaccine. You may need a Td booster every 10 years. Zoster vaccine. You may need this after age 59. Pneumococcal 13-valent conjugate (PCV13) vaccine. One dose is recommended after age 60. Pneumococcal polysaccharide (PPSV23) vaccine. One dose is recommended after age 66. Talk to your health care provider about which screenings and vaccines you need and how often you need them. This information is not intended to replace advice given to you by your health care provider. Make sure you discuss any questions you have with your health care provider. Document Released: 05/20/2015 Document Revised: 01/11/2016 Document Reviewed: 02/22/2015 Elsevier Interactive Patient Education  2017 [place] Prevention in the Home Falls can cause injuries. They can happen to people of all ages. There are many things you can do to make your home safe and to help prevent falls. What can I do on the outside of my home? Regularly fix the edges of walkways and driveways and fix any cracks. Remove anything that might make you trip as you walk through a door, such as a raised step or threshold. Trim any bushes or trees on the path to your home. Use bright outdoor lighting. Clear any walking paths of anything that might make someone trip, such as rocks or tools. Regularly check to see if handrails are loose or broken. Make sure that both sides of any steps have handrails. Any raised decks and porches should have guardrails on the edges. Have any leaves, snow, or ice cleared regularly. Use sand or salt on walking paths during winter. Clean up any spills in your garage right away. This  includes oil or grease spills. What can I do in the bathroom? Use night lights. Install grab bars by the toilet and in the tub and shower. Do not use towel bars as grab bars. Use non-skid mats or decals in the tub or shower. If you need to sit down in the shower, use a plastic, non-slip stool. Keep the floor dry. Clean up any water that spills on the floor as soon as it happens. Remove soap buildup in the tub or shower regularly. Attach bath mats securely with double-sided non-slip rug tape. Do not have throw rugs and other things on the floor that can make you trip. What can I do in the bedroom? Use night lights. Make sure that you have a light by your bed that is easy to reach. Do not use any sheets or blankets that are too big for your bed. They should not hang down onto the floor. Have a firm chair that has side arms. You can use this for support while you get dressed. Do not have throw rugs and other things on the floor that can make you trip. What can I do in  the kitchen? Clean up any spills right away. Avoid walking on wet floors. Keep items that you use a lot in easy-to-reach places. If you need to reach something above you, use a strong step stool that has a grab bar. Keep electrical cords out of the way. Do not use floor polish or wax that makes floors slippery. If you must use wax, use non-skid floor wax. Do not have throw rugs and other things on the floor that can make you trip. What can I do with my stairs? Do not leave any items on the stairs. Make sure that there are handrails on both sides of the stairs and use them. Fix handrails that are broken or loose. Make sure that handrails are as long as the stairways. Check any carpeting to make sure that it is firmly attached to the stairs. Fix any carpet that is loose or worn. Avoid having throw rugs at the top or bottom of the stairs. If you do have throw rugs, attach them to the floor with carpet tape. Make sure that you  have a light switch at the top of the stairs and the bottom of the stairs. If you do not have them, ask someone to add them for you. What else can I do to help prevent falls? Wear shoes that: Do not have high heels. Have rubber bottoms. Are comfortable and fit you well. Are closed at the toe. Do not wear sandals. If you use a stepladder: Make sure that it is fully opened. Do not climb a closed stepladder. Make sure that both sides of the stepladder are locked into place. Ask someone to hold it for you, if possible. Clearly mark and make sure that you can see: Any grab bars or handrails. First and last steps. Where the edge of each step is. Use tools that help you move around (mobility aids) if they are needed. These include: Canes. Walkers. Scooters. Crutches. Turn on the lights when you go into a dark area. Replace any light bulbs as soon as they burn out. Set up your furniture so you have a clear path. Avoid moving your furniture around. If any of your floors are uneven, fix them. If there are any pets around you, be aware of where they are. Review your medicines with your doctor. Some medicines can make you feel dizzy. This can increase your chance of falling. Ask your doctor what other things that you can do to help prevent falls. This information is not intended to replace advice given to you by your health care provider. Make sure you discuss any questions you have with your health care provider. Document Released: 02/17/2009 Document Revised: 09/29/2015 Document Reviewed: 05/28/2014 Elsevier Interactive Patient Education  2017 [employer].

## 2022-06-05 ENCOUNTER — Encounter: Payer: Self-pay | Admitting: Rehabilitative and Restorative Service Providers"

## 2022-06-05 ENCOUNTER — Ambulatory Visit: Payer: HMO | Admitting: Rehabilitative and Restorative Service Providers"

## 2022-06-05 DIAGNOSIS — R42 Dizziness and giddiness: Secondary | ICD-10-CM

## 2022-06-05 DIAGNOSIS — R252 Cramp and spasm: Secondary | ICD-10-CM

## 2022-06-05 DIAGNOSIS — M542 Cervicalgia: Secondary | ICD-10-CM

## 2022-06-05 DIAGNOSIS — R2689 Other abnormalities of gait and mobility: Secondary | ICD-10-CM

## 2022-06-05 DIAGNOSIS — M6281 Muscle weakness (generalized): Secondary | ICD-10-CM

## 2022-06-05 NOTE — Therapy (Signed)
OUTPATIENT PHYSICAL THERAPY TREATMENT NOTE     Patient Name: COVE HAYDON MRN: [id number] DOB:05/11/1955, 67 y.o., male Today's Date: 06/05/2022        PT End of Session - 06/05/22 1231     Visit Number 14    Date for PT Re-Evaluation 07/06/22    Authorization Type [employer]    Progress Note Due on Visit 20    PT Start Time 1229    PT Stop Time 1310    PT Time Calculation (min) 41 min    Activity Tolerance Patient tolerated treatment well    Behavior During Therapy WFL for tasks assessed/performed             Past Medical History:  Diagnosis Date   Arthritis    Bilateral swelling of feet    Chicken pox    Complication of anesthesia    difficulty waking up   Depression    Hypertension    Joint pain    Obesity    Osteoarthritis    Prediabetes    Shortness of breath    Swelling    feet and legs   Vitamin D deficiency    Past Surgical History:  Procedure Laterality Date   cartilage removal     COLONOSCOPY WITH PROPOFOL N/A 12/14/2019   Procedure: COLONOSCOPY WITH PROPOFOL;  Surgeon: Doran Stabler, MD;  Location: [facility];  Service: Gastroenterology;  Laterality: N/A;   KNEE SURGERY  2000   POLYPECTOMY  12/14/2019   Procedure: POLYPECTOMY;  Surgeon: Doran Stabler, MD;  Location: [facility];  Service: Gastroenterology;;   TOTAL HIP ARTHROPLASTY Left 02/11/2019   Procedure: TOTAL HIP [place];  Surgeon: Gaynelle Arabian, MD;  Location: [facility];  Service: Orthopedics;  Laterality: Left;  148mn   Patient Active Problem List   Diagnosis Date Noted   Depression 09/08/2020   Special screening for malignant neoplasms, colon    Benign neoplasm of rectum    OA (osteoarthritis) of hip 02/11/2019   Other hyperlipidemia 04/10/2017   Vitamin D deficiency 04/10/2017   Prediabetes 12/17/2016   Class 3 severe obesity with serious comorbidity and body mass index (BMI) of 50.0 to 59.9 in adult ([facility] [place] 12/17/2016   At risk for diabetes  mellitus 10/02/2016   Hyperglycemia 09/04/2016   Severe obesity (BMI >= 40) ([facility] 01/15/2013   OBESITY, MORBID 01/20/2007   Essential hypertension 10/31/2006    PCP: NDorothyann Peng NP REFERRING PROVIDER: NDorothyann Peng NP  REFERRING DIAG: R42 (ICD-10-CM) - Vertigo  THERAPY DIAG:  Dizziness and giddiness  Other abnormalities of gait and mobility  Muscle weakness (generalized)  Cervicalgia  Cramp and spasm  ONSET DATE: At least a couple of months  Rationale for Evaluation and Treatment: Rehabilitation  SUBJECTIVE:   SUBJECTIVE STATEMENT: Pt reports primarily having knee pain today.  Pt accompanied by: self  PERTINENT HISTORY: pre-diabetes, HTN, cervical radiculopathy, OSA  PAIN:  Are you having pain? Yes: NPRS scale: 5/10 Pain location: knees Pain description: sharp Aggravating factors: activity Relieving factors: ice, medication  PRECAUTIONS: Fall  WEIGHT BEARING RESTRICTIONS: No  FALLS: Has patient fallen in last 6 months? No  LIVING ENVIRONMENT: Lives with: lives with their spouse lives with wife who is primarily w/c bound and needs assistance with most daily activities Lives in: House/apartment Stairs:  2 story home, but they live on one level.  They have a platform lift to help get to driveway. Has following equipment at home: Single point cane and Grab bars  PLOF: Independent and Leisure: watching tv and sports  PATIENT GOALS: To be able to function again and get rid of the dizziness.  OBJECTIVE:   DIAGNOSTIC FINDINGS: Unremarkable carotid Doppler ultrasound   COGNITION: Overall cognitive status: Within functional limits for tasks assessed   SENSATION: Reports numbness and tingling down both arms   POSTURE:  rounded shoulders and forward head  Cervical ROM:    Active A/ROM (deg) eval A/ROM (deg) 05/14/22  Flexion 60 60  Extension 20 20  Right lateral flexion 40 35  Left lateral flexion 30 35  Right rotation 50 60  Left rotation 40  60  (Blank rows = not tested)  STRENGTH:   Eval: BUE strength is WFL Bilateral hips 4/5 Right quad/hamstring 4/5  05/14/2022: Bilateral hips 4 to 4+/5 Right quad/hamstring 4 to 4+/5   GAIT: Gait pattern: decreased stride length, antalgic, and wide BOS Distance walked: 100 ft Assistive device utilized: Single point cane Level of assistance: Modified independence Comments: Pt with dyspnea on exertion and increased pain  FUNCTIONAL TESTS:  Eval: Timed up and go (TUG): 19.3 sec without assistive device, RPE of 5/10  04/02/2022: Dynamic Visual Acuity:  Can read 20/20 on chart normally, with fast horizontal head movements can read 20/50 (difference of 4 lines)  04/12/2022: 3 minute walk test:  412 ft with RPE of 8/10  05/11/2022: 3 minute walk test:  445 ft with RPE of 8-9/10  05/14/2022: Dynamic Visual Acuity:  Can read 20/20 on chart normally, with fast horizontal head movements can read 20/30 (difference of 2 lines) Timed up and go (TUG): 12.6 sec without assistive device, RPE of 2/10 5 times sit to/from stand:  18.4 sec without UE  06/05/2022: 5 times sit to/from stand:  14.34 sec without UE  PATIENT SURVEYS:  Eval: DHI 68 / 100 Physical Score: 18 / 28 Emotional Score: 14 / 36 Functional Score: 36 / 36  04/23/2022: DHI Total Score: 64 / 100 Physical Score: 18 / 28 Emotional Score: 12 / 36 Functional Score: 34 / 36  05/14/2022: DHI Total Score: 12 / 100 Physical Score: 4 / 28 Emotional Score: 2 / 36 Functional Score: 6 / 36  VESTIBULAR ASSESSMENT:  SYMPTOM BEHAVIOR:  Subjective history: Pt reports having dizziness for at least 2 months  Non-Vestibular symptoms: changes in hearing, changes in vision, neck pain, headaches, and nausea/vomiting  Type of dizziness: Blurred Vision, Imbalance (Disequilibrium), and Spinning/Vertigo  Frequency: daily  Duration: varies, but sometimes more than a couple of hours  Aggravating factors: Induced by motion: occur when  walking, looking up at the ceiling, bending down to the ground, driving, and activity in general and "any type of exertion"  Relieving factors: head stationary and closing eyes  Progression of symptoms: unchanged  OCULOMOTOR EXAM:  Ocular Alignment: normal  Ocular ROM: No Limitations  Spontaneous Nystagmus: absent  Gaze-Induced Nystagmus: absent  Smooth Pursuits:  occasional nystagmus noted with tracking towards right side  Saccades: slow   VESTIBULAR - OCULAR REFLEX:   Positive head thrust   POSITIONAL TESTING: Right Dix-Hallpike: no nystagmus Left Dix-Hallpike: no nystagmus    OTHOSTATICS: not done   VESTIBULAR TREATMENT:  DATE: 06/05/2022 Nustep level 3 x6 min with PT present to discuss status Seated hip clamshell with green tband 2x10 Seated hamstring curl with green tband 2x10 bilat Seated with 1.5#:  LAQ, hip side step/back.  2x10 bilat each Seated shoulder ER and horizontal abduction with green tband 2x10 each Standing rows and shoulder extension with green tband 2x10 each bilat Sit to/from stand 2x5   DATE: 05/29/2022 Nustep level 3 x6 min with PT present to discuss status Seated hip clamshell with green tband 2x10 Seated hamstring curl with green tband 2x10 bilat Seated shoulder ER and horizontal abduction with green tband 2x10 each Seated with 1#:  LAQ, hip side step/back.  2x10 bilat each Standing rows and shoulder extension with green tband 2x10 each bilat Sit to/from stand 2x5   DATE: 05/22/2022 Cervical SNAGs for rotation and extension with use of towel 2x10 each Nustep level 3 x6 min with PT present to discuss status Seated LAQ with 2 sec hold 2x10 bilat Seated shoulder ER and horizontal abduction with green tband 2x10 each Seated hip clamshell with green tband 2x10 Seated hamstring curl with green tband 2x10 bilat Sit to/from stand 2x5    PATIENT  EDUCATION: Education details: Pt issued HEP Person educated: Patient Education method: Explanation, Demonstration, and Handouts Education comprehension: verbalized understanding and returned demonstration  HOME EXERCISE PROGRAM: Access Code: [email or web address] URL: [email or web address] Date: 05/11/2022 Prepared by: Shelby Dubin Kymoni Monday  Exercises - Pencil Pushups  - 2 x daily - 7 x weekly - 2 sets - 10 reps - Seated Vertical Smooth Pursuit  - 2 x daily - 7 x weekly - 2 sets - 10 reps - Seated Diagonal Smooth Pursuit  - 2 x daily - 7 x weekly - 2 sets - 10 reps - Seated Horizontal Smooth Pursuit  - 2 x daily - 7 x weekly - 2 sets - 10 reps - Seated Gaze Stabilization with Head Rotation  - 1 x daily - 7 x weekly - 3 sets - 10 reps - Seated Gaze Stabilization with Head Nod  - 1 x daily - 7 x weekly - 3 sets - 10 reps - Seated Cervical Retraction  - 1 x daily - 7 x weekly - 2 sets - 10 reps - Seated Scapular Retraction  - 1 x daily - 7 x weekly - 2 sets - 10 reps - Seated Upper Trapezius Stretch  - 1 x daily - 7 x weekly - 1 sets - 2 reps - 20 sec hold - Seated Assisted Cervical Rotation with Towel  - 1 x daily - 7 x weekly - 2 sets - 10 reps - Cervical Extension AROM with Strap  - 1 x daily - 7 x weekly - 2 sets - 10 reps - Shoulder External Rotation and Scapular Retraction with Resistance  - 1 x daily - 7 x weekly - 2 sets - 10 reps - Standing Shoulder Horizontal Abduction with Resistance  - 1 x daily - 7 x weekly - 2 sets - 10 reps - Standing Shoulder Row with Anchored Resistance  - 1 x daily - 7 x weekly - 2 sets - 10 reps  GOALS: Goals reviewed with patient? Yes  SHORT TERM GOALS: Target date: 04/11/2022   Pt will be independent with initial HEP. Baseline: Goal status: MET  2.  Pt will report at least a 30% improvement in dizziness. Baseline:  Goal status: MET   LONG TERM GOALS: Target date: 07/06/2022    Pt will be independent with advanced  HEP. Baseline:  Goal status: IN  PROGRESS  2.  Pt will improve Dizziness Handicapped Inventory to no greater than 50% to demonstrate pts improved functional independence. Baseline: 68/100 Goal status: MET on 05/14/2022  3.  Pt will increase hip and knee strength to at least 4+/5 to allow him to perform transfers with improved ease. Baseline:  Goal status: IN PROGRESS  4.  Pt will be able to ambulate at least 15 minutes with RPE no greater than 5/10 and without a loss of balance. Baseline: RPE of 5/10 with TUG (on 05/11/22, RPE of 8-9/10 with 3 minute walk test) Goal status: IN PROGRESS   ASSESSMENT:  CLINICAL IMPRESSION: Mr Engelstad presents to skilled PT with reports of only knee pain today, otherwise feeling better.  Patient states that he can tell that he has gotten significantly better since his initial evaluation and is no longer having the same dizziness.  Patient with improved time noted with 5 times sit to/from stand and continues to progress with standing tolerance during session.  However, limited in standing secondary to knee pain.  Patient continues to require skilled PT to further progress towards goal related activities.    OBJECTIVE IMPAIRMENTS: Abnormal gait, decreased activity tolerance, decreased balance, difficulty walking, decreased strength, dizziness, impaired flexibility, postural dysfunction, obesity, and pain.   ACTIVITY LIMITATIONS: bending, standing, squatting, stairs, locomotion level, and caring for others  PARTICIPATION LIMITATIONS: cleaning and community activity  PERSONAL FACTORS: 3+ comorbidities: obesity, HTN, pre-diabetes, obstructive sleep apnea, cervical radiculopathy  are also affecting patient's functional outcome.   REHAB POTENTIAL: Good  CLINICAL DECISION MAKING: Evolving/moderate complexity  EVALUATION COMPLEXITY: Moderate   PLAN:  PT FREQUENCY: 1-2x/week  PT DURATION: 8 weeks  PLANNED INTERVENTIONS: Therapeutic exercises, Therapeutic activity, Neuromuscular  re-education, Balance training, Gait training, Patient/Family education, Self Care, Joint mobilization, Joint manipulation, Stair training, Vestibular training, Canalith repositioning, Aquatic Therapy, Dry Needling, Electrical stimulation, Spinal manipulation, Spinal mobilization, Cryotherapy, Moist heat, Taping, Ultrasound, Ionotophoresis '4mg'$ /ml Dexamethasone, Manual therapy, and Re-evaluation  PLAN FOR NEXT SESSION: assess and progress HEP, vestibular rehab, strengthening, Marye Round  and canalith repositioning as indicated   Juel Burrow, PT, DPT 06/05/2022, 1:14 PM   [facility] [address], [place] [address] [place], [place] [address] Phone # [phone] Fax [phone]

## 2022-06-12 ENCOUNTER — Encounter: Payer: Self-pay | Admitting: Rehabilitative and Restorative Service Providers"

## 2022-06-12 ENCOUNTER — Ambulatory Visit: Payer: HMO | Attending: Adult Health | Admitting: Rehabilitative and Restorative Service Providers"

## 2022-06-12 DIAGNOSIS — R2689 Other abnormalities of gait and mobility: Secondary | ICD-10-CM | POA: Insufficient documentation

## 2022-06-12 DIAGNOSIS — M6281 Muscle weakness (generalized): Secondary | ICD-10-CM | POA: Diagnosis not present

## 2022-06-12 DIAGNOSIS — R252 Cramp and spasm: Secondary | ICD-10-CM | POA: Insufficient documentation

## 2022-06-12 DIAGNOSIS — R42 Dizziness and giddiness: Secondary | ICD-10-CM | POA: Insufficient documentation

## 2022-06-12 DIAGNOSIS — M542 Cervicalgia: Secondary | ICD-10-CM | POA: Insufficient documentation

## 2022-06-12 NOTE — Therapy (Signed)
OUTPATIENT PHYSICAL THERAPY TREATMENT NOTE     Patient Name: James [name] MRN: [id number] DOB:[birth year], 67 y.o., male Today's Date: 06/12/2022        PT End of Session - 06/12/22 1230     Visit Number 15    Date for PT Re-Evaluation 07/06/22    Authorization Type [employer]    Progress Note Due on Visit 20    PT Start Time 1228    PT Stop Time 1251    PT Time Calculation (min) 23 min    Activity Tolerance Patient tolerated treatment well    Behavior During Therapy WFL for tasks assessed/performed             Past Medical History:  Diagnosis Date   Arthritis    Bilateral swelling of feet    Chicken pox    Complication of anesthesia    difficulty waking up   Depression    Hypertension    Joint pain    Obesity    Osteoarthritis    Prediabetes    Shortness of breath    Swelling    feet and legs   Vitamin D deficiency    Past Surgical History:  Procedure Laterality Date   cartilage removal     COLONOSCOPY WITH PROPOFOL N/A 12/14/2019   Procedure: COLONOSCOPY WITH PROPOFOL;  Surgeon: Doran Stabler, MD;  Location: [facility];  Service: Gastroenterology;  Laterality: N/A;   KNEE SURGERY  2000   POLYPECTOMY  12/14/2019   Procedure: POLYPECTOMY;  Surgeon: Doran Stabler, MD;  Location: [facility];  Service: Gastroenterology;;   TOTAL HIP ARTHROPLASTY Left 02/11/2019   Procedure: TOTAL HIP [place];  Surgeon: Gaynelle Arabian, MD;  Location: [facility];  Service: Orthopedics;  Laterality: Left;  113mn   Patient Active Problem List   Diagnosis Date Noted   Depression 09/08/2020   Special screening for malignant neoplasms, colon    Benign neoplasm of rectum    OA (osteoarthritis) of hip 02/11/2019   Other hyperlipidemia 04/10/2017   Vitamin D deficiency 04/10/2017   Prediabetes 12/17/2016   Class 3 severe obesity with serious comorbidity and body mass index (BMI) of 50.0 to 59.9 in adult ([facility] 12/17/2016   At risk for diabetes  mellitus 10/02/2016   Hyperglycemia 09/04/2016   Severe obesity (BMI >= 40) ([facility] 01/15/2013   OBESITY, MORBID 01/20/2007   Essential hypertension 10/31/2006    PCP: NDorothyann Peng NP REFERRING PROVIDER: NDorothyann Peng NP  REFERRING DIAG: R42 (ICD-10-CM) - Vertigo  THERAPY DIAG:  Dizziness and giddiness  Other abnormalities of gait and mobility  Muscle weakness (generalized)  Cervicalgia  Cramp and spasm  ONSET DATE: At least a couple of months  Rationale for Evaluation and Treatment: Rehabilitation  SUBJECTIVE:   SUBJECTIVE STATEMENT: Pt reports that he is feeling weaker today and having a difficult time concentrating.  States general body pain.  Patient states that his CPAP has been picking up on more "events" at night the past week.  Pt accompanied by: self  PERTINENT HISTORY: pre-diabetes, HTN, cervical radiculopathy, OSA  PAIN:  Are you having pain? Yes: NPRS scale: 3-5/10 Pain location: knees Pain description: sharp Aggravating factors: activity Relieving factors: ice, medication  PRECAUTIONS: Fall  WEIGHT BEARING RESTRICTIONS: No  FALLS: Has patient fallen in last 6 months? No  LIVING ENVIRONMENT: Lives with: lives with their spouse lives with wife who is primarily w/c bound and needs assistance with most daily activities Lives in: House/apartment Stairs:  2 story  home, but they live on one level.  They have a platform lift to help get to driveway. Has following equipment at home: Single point cane and Grab bars  PLOF: Independent and Leisure: watching tv and sports  PATIENT GOALS: To be able to function again and get rid of the dizziness.  OBJECTIVE:   DIAGNOSTIC FINDINGS: Unremarkable carotid Doppler ultrasound   COGNITION: Overall cognitive status: Within functional limits for tasks assessed   SENSATION: Reports numbness and tingling down both arms   POSTURE:  rounded shoulders and forward head  Cervical ROM:    Active A/ROM  (deg) eval A/ROM (deg) 05/14/22  Flexion 60 60  Extension 20 20  Right lateral flexion 40 35  Left lateral flexion 30 35  Right rotation 50 60  Left rotation 40 60  (Blank rows = not tested)  STRENGTH:   Eval: BUE strength is WFL Bilateral hips 4/5 Right quad/hamstring 4/5  05/14/2022: Bilateral hips 4 to 4+/5 Right quad/hamstring 4 to 4+/5   GAIT: Gait pattern: decreased stride length, antalgic, and wide BOS Distance walked: 100 ft Assistive device utilized: Single point cane Level of assistance: Modified independence Comments: Pt with dyspnea on exertion and increased pain  FUNCTIONAL TESTS:  Eval: Timed up and go (TUG): 19.3 sec without assistive device, RPE of 5/10  04/02/2022: Dynamic Visual Acuity:  Can read 20/20 on chart normally, with fast horizontal head movements can read 20/50 (difference of 4 lines)  04/12/2022: 3 minute walk test:  412 ft with RPE of 8/10  05/11/2022: 3 minute walk test:  445 ft with RPE of 8-9/10  05/14/2022: Dynamic Visual Acuity:  Can read 20/20 on chart normally, with fast horizontal head movements can read 20/30 (difference of 2 lines) Timed up and go (TUG): 12.6 sec without assistive device, RPE of 2/10 5 times sit to/from stand:  18.4 sec without UE  06/05/2022: 5 times sit to/from stand:  14.34 sec without UE  PATIENT SURVEYS:  Eval: DHI 68 / 100 Physical Score: 18 / 28 Emotional Score: 14 / 36 Functional Score: 36 / 36  04/23/2022: DHI Total Score: 64 / 100 Physical Score: 18 / 28 Emotional Score: 12 / 36 Functional Score: 34 / 36  05/14/2022: DHI Total Score: 12 / 100 Physical Score: 4 / 28 Emotional Score: 2 / 36 Functional Score: 6 / 36  VESTIBULAR ASSESSMENT:  SYMPTOM BEHAVIOR:  Subjective history: Pt reports having dizziness for at least 2 months  Non-Vestibular symptoms: changes in hearing, changes in vision, neck pain, headaches, and nausea/vomiting  Type of dizziness: Blurred Vision, Imbalance  (Disequilibrium), and Spinning/Vertigo  Frequency: daily  Duration: varies, but sometimes more than a couple of hours  Aggravating factors: Induced by motion: occur when walking, looking up at the ceiling, bending down to the ground, driving, and activity in general and "any type of exertion"  Relieving factors: head stationary and closing eyes  Progression of symptoms: unchanged  OCULOMOTOR EXAM:  Ocular Alignment: normal  Ocular ROM: No Limitations  Spontaneous Nystagmus: absent  Gaze-Induced Nystagmus: absent  Smooth Pursuits:  occasional nystagmus noted with tracking towards right side  Saccades: slow   VESTIBULAR - OCULAR REFLEX:   Positive head thrust   POSITIONAL TESTING: Right Dix-Hallpike: no nystagmus Left Dix-Hallpike: no nystagmus    OTHOSTATICS: not done   VESTIBULAR TREATMENT:  DATE: 06/12/2022 Nustep level 3 x4 min with PT present to discuss status Seated shoulder ER and horizontal abduction with green tband 2x10 each Seated upper trap stretch 2x20 sec bilat Seated SNAGS for rotation and extension 2x10 each Standing rows and shoulder extension with green tband 2x10 each bilat   DATE: 06/05/2022 Nustep level 3 x6 min with PT present to discuss status Seated hip clamshell with green tband 2x10 Seated hamstring curl with green tband 2x10 bilat Seated with 1.5#:  LAQ, hip side step/back.  2x10 bilat each Seated shoulder ER and horizontal abduction with green tband 2x10 each Standing rows and shoulder extension with green tband 2x10 each bilat Sit to/from stand 2x5   DATE: 05/29/2022 Nustep level 3 x6 min with PT present to discuss status Seated hip clamshell with green tband 2x10 Seated hamstring curl with green tband 2x10 bilat Seated shoulder ER and horizontal abduction with green tband 2x10 each Seated with 1#:  LAQ, hip side step/back.  2x10 bilat  each Standing rows and shoulder extension with green tband 2x10 each bilat Sit to/from stand 2x5    PATIENT EDUCATION: Education details: Pt issued HEP Person educated: Patient Education method: Explanation, Demonstration, and Handouts Education comprehension: verbalized understanding and returned demonstration  HOME EXERCISE PROGRAM: Access Code: [email or web address] URL: [email or web address] Date: 05/11/2022 Prepared by: Shelby Dubin Chabely Nor[place]  Exercises - Pencil Pushups  - 2 x daily - 7 x weekly - 2 sets - 10 reps - Seated Vertical Smooth Pursuit  - 2 x daily - 7 x weekly - 2 sets - 10 reps - Seated Diagonal Smooth Pursuit  - 2 x daily - 7 x weekly - 2 sets - 10 reps - Seated Horizontal Smooth Pursuit  - 2 x daily - 7 x weekly - 2 sets - 10 reps - Seated Gaze Stabilization with Head Rotation  - 1 x daily - 7 x weekly - 3 sets - 10 reps - Seated Gaze Stabilization with Head Nod  - 1 x daily - 7 x weekly - 3 sets - 10 reps - Seated Cervical Retraction  - 1 x daily - 7 x weekly - 2 sets - 10 reps - Seated Scapular Retraction  - 1 x daily - 7 x weekly - 2 sets - 10 reps - Seated Upper Trapezius Stretch  - 1 x daily - 7 x weekly - 1 sets - 2 reps - 20 sec hold - Seated Assisted Cervical Rotation with Towel  - 1 x daily - 7 x weekly - 2 sets - 10 reps - Cervical Extension AROM with Strap  - 1 x daily - 7 x weekly - 2 sets - 10 reps - Shoulder External Rotation and Scapular Retraction with Resistance  - 1 x daily - 7 x weekly - 2 sets - 10 reps - Standing Shoulder Horizontal Abduction with Resistance  - 1 x daily - 7 x weekly - 2 sets - 10 reps - Standing Shoulder Row with Anchored Resistance  - 1 x daily - 7 x weekly - 2 sets - 10 reps  GOALS: Goals reviewed with patient? Yes  SHORT TERM GOALS: Target date: 04/11/2022   Pt will be independent with initial HEP. Baseline: Goal status: MET  2.  Pt will report at least a 30% improvement in dizziness. Baseline:  Goal status:  MET   LONG TERM GOALS: Target date: 07/06/2022    Pt will be independent with advanced HEP. Baseline:  Goal status: IN PROGRESS  2.  Pt will  improve Dizziness Handicapped Inventory to no greater than 50% to demonstrate pts improved functional independence. Baseline: 68/100 Goal status: MET on 05/14/2022  3.  Pt will increase hip and knee strength to at least 4+/5 to allow him to perform transfers with improved ease. Baseline:  Goal status: IN PROGRESS  4.  Pt will be able to ambulate at least 15 minutes with RPE no greater than 5/10 and without a loss of balance. Baseline: RPE of 5/10 with TUG (on 05/11/22, RPE of 8-9/10 with 3 minute walk test) Goal status: IN PROGRESS   ASSESSMENT:  CLINICAL IMPRESSION: Mr Petrucelli presents to skilled PT with reports of not sleeping well and not feeling well.  Patient was able to participate in seated exercises secondary to fatigue and was utilizing his [facility] today.  Patient opted for shorter session today secondary to overall fatigue.    OBJECTIVE IMPAIRMENTS: Abnormal gait, decreased activity tolerance, decreased balance, difficulty walking, decreased strength, dizziness, impaired flexibility, postural dysfunction, obesity, and pain.   ACTIVITY LIMITATIONS: bending, standing, squatting, stairs, locomotion level, and caring for others  PARTICIPATION LIMITATIONS: cleaning and community activity  PERSONAL FACTORS: 3+ comorbidities: obesity, HTN, pre-diabetes, obstructive sleep apnea, cervical radiculopathy  are also affecting patient's functional outcome.   REHAB POTENTIAL: Good  CLINICAL DECISION MAKING: Evolving/moderate complexity  EVALUATION COMPLEXITY: Moderate   PLAN:  PT FREQUENCY: 1-2x/week  PT DURATION: 8 weeks  PLANNED INTERVENTIONS: Therapeutic exercises, Therapeutic activity, Neuromuscular re-education, Balance training, Gait training, Patient/Family education, Self Care, Joint mobilization, Joint manipulation, Stair training,  Vestibular training, Canalith repositioning, Aquatic Therapy, Dry Needling, Electrical stimulation, Spinal manipulation, Spinal mobilization, Cryotherapy, Moist heat, Taping, Ultrasound, Ionotophoresis '4mg'$ /ml Dexamethasone, Manual therapy, and Re-evaluation  PLAN FOR NEXT SESSION: assess and progress HEP, vestibular rehab, strengthening, Marye Round  and canalith repositioning as indicated   Juel Burrow, PT, DPT 06/12/2022, 1:07 PM   [facility] [address] [place], [place] [address] Phone # [phone] Fax [phone]

## 2022-06-19 ENCOUNTER — Encounter: Payer: Self-pay | Admitting: Rehabilitative and Restorative Service Providers"

## 2022-06-19 ENCOUNTER — Ambulatory Visit: Payer: HMO | Admitting: Rehabilitative and Restorative Service Providers"

## 2022-06-19 DIAGNOSIS — M542 Cervicalgia: Secondary | ICD-10-CM

## 2022-06-19 DIAGNOSIS — R42 Dizziness and giddiness: Secondary | ICD-10-CM | POA: Diagnosis not present

## 2022-06-19 DIAGNOSIS — R2689 Other abnormalities of gait and mobility: Secondary | ICD-10-CM

## 2022-06-19 DIAGNOSIS — R252 Cramp and spasm: Secondary | ICD-10-CM

## 2022-06-19 DIAGNOSIS — M6281 Muscle weakness (generalized): Secondary | ICD-10-CM

## 2022-06-19 NOTE — Therapy (Signed)
OUTPATIENT PHYSICAL THERAPY TREATMENT NOTE     Patient Name: James [name] MRN: [id number] DOB:27-Apr-1956, 67 y.o., male Today's Date: 06/19/2022        PT End of Session - 06/19/22 1227     Visit Number 16    Date for PT Re-Evaluation 07/06/22    Authorization Type [employer]    Progress Note Due on Visit 20    PT Start Time 1225    PT Stop Time 1305    PT Time Calculation (min) 40 min    Activity Tolerance Patient tolerated treatment well    Behavior During Therapy WFL for tasks assessed/performed             Past Medical History:  Diagnosis Date   Arthritis    Bilateral swelling of feet    Chicken pox    Complication of anesthesia    difficulty waking up   Depression    Hypertension    Joint pain    Obesity    Osteoarthritis    Prediabetes    Shortness of breath    Swelling    feet and legs   Vitamin D deficiency    Past Surgical History:  Procedure Laterality Date   cartilage removal     COLONOSCOPY WITH PROPOFOL N/A 12/14/2019   Procedure: COLONOSCOPY WITH PROPOFOL;  Surgeon: Doran Stabler, MD;  Location: [facility];  Service: Gastroenterology;  Laterality: N/A;   KNEE SURGERY  2000   POLYPECTOMY  12/14/2019   Procedure: POLYPECTOMY;  Surgeon: Doran Stabler, MD;  Location: [facility];  Service: Gastroenterology;;   TOTAL HIP ARTHROPLASTY Left 02/11/2019   Procedure: TOTAL HIP [place];  Surgeon: Gaynelle Arabian, MD;  Location: [facility];  Service: Orthopedics;  Laterality: Left;  139mn   Patient Active Problem List   Diagnosis Date Noted   Depression 09/08/2020   Special screening for malignant neoplasms, colon    Benign neoplasm of rectum    OA (osteoarthritis) of hip 02/11/2019   Other hyperlipidemia 04/10/2017   Vitamin D deficiency 04/10/2017   Prediabetes 12/17/2016   Class 3 severe obesity with serious comorbidity and body mass index (BMI) of 50.0 to 59.9 in adult ([facility] 12/17/2016   At risk for diabetes  mellitus 10/02/2016   Hyperglycemia 09/04/2016   Severe obesity (BMI >= 40) ([facility] 01/15/2013   OBESITY, MORBID 01/20/2007   Essential hypertension 10/31/2006    PCP: NDorothyann Peng NP REFERRING PROVIDER: NDorothyann Peng NP  REFERRING DIAG: R42 (ICD-10-CM) - Vertigo  THERAPY DIAG:  Dizziness and giddiness  Other abnormalities of gait and mobility  Muscle weakness (generalized)  Cervicalgia  Cramp and spasm  ONSET DATE: At least a couple of months  Rationale for Evaluation and Treatment: Rehabilitation  SUBJECTIVE:   SUBJECTIVE STATEMENT: Pt reports that he is having some increased knee pain today, but his CPAP is showing less "events" than last visit and he is sleeping some better.  Pt accompanied by: self  PERTINENT HISTORY: pre-diabetes, HTN, cervical radiculopathy, OSA  PAIN:  Are you having pain? Yes: NPRS scale: 6/10 Pain location: knees, neck and back Pain description: sharp Aggravating factors: activity Relieving factors: ice, medication  PRECAUTIONS: Fall  WEIGHT BEARING RESTRICTIONS: No  FALLS: Has patient fallen in last 6 months? No  LIVING ENVIRONMENT: Lives with: lives with their spouse lives with wife who is primarily w/c bound and needs assistance with most daily activities Lives in: House/apartment Stairs:  2 story home, but they live on one level.  They have a platform lift to help get to driveway. Has following equipment at home: Single point cane and Grab bars  PLOF: Independent and Leisure: watching tv and sports  PATIENT GOALS: To be able to function again and get rid of the dizziness.  OBJECTIVE:   DIAGNOSTIC FINDINGS: Unremarkable carotid Doppler ultrasound   COGNITION: Overall cognitive status: Within functional limits for tasks assessed   SENSATION: Reports numbness and tingling down both arms   POSTURE:  rounded shoulders and forward head  Cervical ROM:    Active A/ROM (deg) eval A/ROM (deg) 05/14/22  Flexion 60 60   Extension 20 20  Right lateral flexion 40 35  Left lateral flexion 30 35  Right rotation 50 60  Left rotation 40 60  (Blank rows = not tested)  STRENGTH:   Eval: BUE strength is WFL Bilateral hips 4/5 Right quad/hamstring 4/5  05/14/2022: Bilateral hips 4 to 4+/5 Right quad/hamstring 4 to 4+/5   GAIT: Gait pattern: decreased stride length, antalgic, and wide BOS Distance walked: 100 ft Assistive device utilized: Single point cane Level of assistance: Modified independence Comments: Pt with dyspnea on exertion and increased pain  FUNCTIONAL TESTS:  Eval: Timed up and go (TUG): 19.3 sec without assistive device, RPE of 5/10  04/02/2022: Dynamic Visual Acuity:  Can read 20/20 on chart normally, with fast horizontal head movements can read 20/50 (difference of 4 lines)  04/12/2022: 3 minute walk test:  412 ft with RPE of 8/10  05/11/2022: 3 minute walk test:  445 ft with RPE of 8-9/10  05/14/2022: Dynamic Visual Acuity:  Can read 20/20 on chart normally, with fast horizontal head movements can read 20/30 (difference of 2 lines) Timed up and go (TUG): 12.6 sec without assistive device, RPE of 2/10 5 times sit to/from stand:  18.4 sec without UE  06/05/2022: 5 times sit to/from stand:  14.34 sec without UE  PATIENT SURVEYS:  Eval: DHI 68 / 100 Physical Score: 18 / 28 Emotional Score: 14 / 36 Functional Score: 36 / 36  04/23/2022: DHI Total Score: 64 / 100 Physical Score: 18 / 28 Emotional Score: 12 / 36 Functional Score: 34 / 36  05/14/2022: DHI Total Score: 12 / 100 Physical Score: 4 / 28 Emotional Score: 2 / 36 Functional Score: 6 / 36  VESTIBULAR ASSESSMENT:  SYMPTOM BEHAVIOR:  Subjective history: Pt reports having dizziness for at least 2 months  Non-Vestibular symptoms: changes in hearing, changes in vision, neck pain, headaches, and nausea/vomiting  Type of dizziness: Blurred Vision, Imbalance (Disequilibrium), and Spinning/Vertigo  Frequency:  daily  Duration: varies, but sometimes more than a couple of hours  Aggravating factors: Induced by motion: occur when walking, looking up at the ceiling, bending down to the ground, driving, and activity in general and "any type of exertion"  Relieving factors: head stationary and closing eyes  Progression of symptoms: unchanged  OCULOMOTOR EXAM:  Ocular Alignment: normal  Ocular ROM: No Limitations  Spontaneous Nystagmus: absent  Gaze-Induced Nystagmus: absent  Smooth Pursuits:  occasional nystagmus noted with tracking towards right side  Saccades: slow   VESTIBULAR - OCULAR REFLEX:   Positive head thrust   POSITIONAL TESTING: Right Dix-Hallpike: no nystagmus Left Dix-Hallpike: no nystagmus    OTHOSTATICS: not done   VESTIBULAR TREATMENT:  DATE: 06/19/2022 Nustep level 3 x6 min with PT present to discuss status Seated shoulder ER and horizontal abduction with green tband 2x10 each Seated rows and shoulder extension with green tband 2x10 each bilat Seated hamstring curl with green tband 2x10 bilat Seated hip abduction with green tband 2x10 bilat Sit to/from stand 2x5 Seated with 1.5#:  LAQ, hip side step/back, marching.  2x10 bilat each   DATE: 06/12/2022 Nustep level 3 x4 min with PT present to discuss status Seated shoulder ER and horizontal abduction with green tband 2x10 each Seated upper trap stretch 2x20 sec bilat Seated SNAGS for rotation and extension 2x10 each Standing rows and shoulder extension with green tband 2x10 each bilat   DATE: 06/05/2022 Nustep level 3 x6 min with PT present to discuss status Seated hip clamshell with green tband 2x10 Seated hamstring curl with green tband 2x10 bilat Seated with 1.5#:  LAQ, hip side step/back.  2x10 bilat each Seated shoulder ER and horizontal abduction with green tband 2x10 each Standing rows and shoulder extension with  green tband 2x10 each bilat Sit to/from stand 2x5    PATIENT EDUCATION: Education details: Pt issued HEP Person educated: Patient Education method: Explanation, Demonstration, and Handouts Education comprehension: verbalized understanding and returned demonstration  HOME EXERCISE PROGRAM: Access Code: [email or web address] URL: [email or web address] Date: 05/11/2022 Prepared by: Shelby Dubin [place]  Exercises - Pencil Pushups  - 2 x daily - 7 x weekly - 2 sets - 10 reps - Seated Vertical Smooth Pursuit  - 2 x daily - 7 x weekly - 2 sets - 10 reps - Seated Diagonal Smooth Pursuit  - 2 x daily - 7 x weekly - 2 sets - 10 reps - Seated Horizontal Smooth Pursuit  - 2 x daily - 7 x weekly - 2 sets - 10 reps - Seated Gaze Stabilization with Head Rotation  - 1 x daily - 7 x weekly - 3 sets - 10 reps - Seated Gaze Stabilization with Head Nod  - 1 x daily - 7 x weekly - 3 sets - 10 reps - Seated Cervical Retraction  - 1 x daily - 7 x weekly - 2 sets - 10 reps - Seated Scapular Retraction  - 1 x daily - 7 x weekly - 2 sets - 10 reps - Seated Upper Trapezius Stretch  - 1 x daily - 7 x weekly - 1 sets - 2 reps - 20 sec hold - Seated Assisted Cervical Rotation with Towel  - 1 x daily - 7 x weekly - 2 sets - 10 reps - Cervical Extension AROM with Strap  - 1 x daily - 7 x weekly - 2 sets - 10 reps - Shoulder External Rotation and Scapular Retraction with Resistance  - 1 x daily - 7 x weekly - 2 sets - 10 reps - Standing Shoulder Horizontal Abduction with Resistance  - 1 x daily - 7 x weekly - 2 sets - 10 reps - Standing Shoulder Row with Anchored Resistance  - 1 x daily - 7 x weekly - 2 sets - 10 reps  GOALS: Goals reviewed with patient? Yes  SHORT TERM GOALS: Target date: 04/11/2022   Pt will be independent with initial HEP. Baseline: Goal status: MET  2.  Pt will report at least a 30% improvement in dizziness. Baseline:  Goal status: MET   LONG TERM GOALS: Target date: 07/06/2022    Pt  will be independent with advanced HEP. Baseline:  Goal status: IN PROGRESS  2.  Pt will improve Dizziness Handicapped Inventory to no greater than 50% to demonstrate pts improved functional independence. Baseline: 68/100 Goal status: MET on 05/14/2022  3.  Pt will increase hip and knee strength to at least 4+/5 to allow him to perform transfers with improved ease. Baseline:  Goal status: IN PROGRESS  4.  Pt will be able to ambulate at least 15 minutes with RPE no greater than 5/10 and without a loss of balance. Baseline: RPE of 5/10 with TUG (on 05/11/22, RPE of 8-9/10 with 3 minute walk test) Goal status: IN PROGRESS   ASSESSMENT:  CLINICAL IMPRESSION: Mr Bentler presents to skilled PT with reports of feeling some better than last week.  Patient able to progress through exercises, but primarily in seated position, secondary to knee pain.  Patient without a loss of balance during ambulation and has not had any new complaints of dizziness.  Patient's main limiting factor is increased pain, but he is able to progress with increased seated activity tolerance.      OBJECTIVE IMPAIRMENTS: Abnormal gait, decreased activity tolerance, decreased balance, difficulty walking, decreased strength, dizziness, impaired flexibility, postural dysfunction, obesity, and pain.   ACTIVITY LIMITATIONS: bending, standing, squatting, stairs, locomotion level, and caring for others  PARTICIPATION LIMITATIONS: cleaning and community activity  PERSONAL FACTORS: 3+ comorbidities: obesity, HTN, pre-diabetes, obstructive sleep apnea, cervical radiculopathy  are also affecting patient's functional outcome.   REHAB POTENTIAL: Good  CLINICAL DECISION MAKING: Evolving/moderate complexity  EVALUATION COMPLEXITY: Moderate   PLAN:  PT FREQUENCY: 1-2x/week  PT DURATION: 8 weeks  PLANNED INTERVENTIONS: Therapeutic exercises, Therapeutic activity, Neuromuscular re-education, Balance training, Gait training,  Patient/Family education, Self Care, Joint mobilization, Joint manipulation, Stair training, Vestibular training, Canalith repositioning, Aquatic Therapy, Dry Needling, Electrical stimulation, Spinal manipulation, Spinal mobilization, Cryotherapy, Moist heat, Taping, Ultrasound, Ionotophoresis 24m/ml Dexamethasone, Manual therapy, and Re-evaluation  PLAN FOR NEXT SESSION: assess and progress HEP, vestibular rehab, strengthening, [employer] and canalith repositioning as indicated   SJuel Burrow PT, DPT 06/19/2022, 1:15 PM   [facility] [place] [address]Phone # [phone] [phone]

## 2022-06-26 ENCOUNTER — Ambulatory Visit: Payer: HMO | Admitting: Rehabilitative and Restorative Service Providers"

## 2022-06-26 ENCOUNTER — Encounter: Payer: Self-pay | Admitting: Rehabilitative and Restorative Service Providers"

## 2022-06-26 DIAGNOSIS — R2689 Other abnormalities of gait and mobility: Secondary | ICD-10-CM

## 2022-06-26 DIAGNOSIS — R42 Dizziness and giddiness: Secondary | ICD-10-CM | POA: Diagnosis not present

## 2022-06-26 DIAGNOSIS — M542 Cervicalgia: Secondary | ICD-10-CM

## 2022-06-26 DIAGNOSIS — R252 Cramp and spasm: Secondary | ICD-10-CM

## 2022-06-26 DIAGNOSIS — M6281 Muscle weakness (generalized): Secondary | ICD-10-CM

## 2022-06-26 NOTE — Therapy (Signed)
OUTPATIENT PHYSICAL THERAPY TREATMENT NOTE     Patient Name: James [name] BELOW MRN: [id number] DOB:12-Aug-1955, 67 y.o., male Today's Date: 06/26/2022        PT End of Session - 06/26/22 1231     Visit Number 17    Date for PT Re-Evaluation 07/06/22    Authorization Type [employer]    Progress Note Due on Visit 20    PT Start Time 1228    PT Stop Time 1300    PT Time Calculation (min) 32 min    Activity Tolerance Patient tolerated treatment well    Behavior During Therapy WFL for tasks assessed/performed             Past Medical History:  Diagnosis Date   Arthritis    Bilateral swelling of feet    Chicken pox    Complication of anesthesia    difficulty waking up   Depression    Hypertension    Joint pain    Obesity    Osteoarthritis    Prediabetes    Shortness of breath    Swelling    feet and legs   Vitamin D deficiency    Past Surgical History:  Procedure Laterality Date   cartilage removal     COLONOSCOPY WITH PROPOFOL N/A 12/14/2019   Procedure: COLONOSCOPY WITH PROPOFOL;  Surgeon: Doran Stabler, MD;  Location: [facility];  Service: Gastroenterology;  Laterality: N/A;   KNEE SURGERY  2000   POLYPECTOMY  12/14/2019   Procedure: POLYPECTOMY;  Surgeon: Doran Stabler, MD;  Location: [facility];  Service: Gastroenterology;;   TOTAL HIP ARTHROPLASTY Left 02/11/2019   Procedure: TOTAL HIP [place];  Surgeon: Gaynelle Arabian, MD;  Location: [facility];  Service: Orthopedics;  Laterality: Left;  172mn   Patient Active Problem List   Diagnosis Date Noted   Depression 09/08/2020   Special screening for malignant neoplasms, colon    Benign neoplasm of rectum    OA (osteoarthritis) of hip 02/11/2019   Other hyperlipidemia 04/10/2017   Vitamin D deficiency 04/10/2017   Prediabetes 12/17/2016   Class 3 severe obesity with serious comorbidity and body mass index (BMI) of 50.0 to 59.9 in adult ([facility] 12/17/2016   At risk for diabetes  mellitus 10/02/2016   Hyperglycemia 09/04/2016   Severe obesity (BMI >= 40) ([facility] 01/15/2013   OBESITY, MORBID 01/20/2007   Essential hypertension 10/31/2006    PCP: NDorothyann Peng NP REFERRING PROVIDER: NDorothyann Peng NP  REFERRING DIAG: R42 (ICD-10-CM) - Vertigo  THERAPY DIAG:  Muscle weakness (generalized)  Other abnormalities of gait and mobility  Cervicalgia  Cramp and spasm  Dizziness and giddiness  ONSET DATE: At least a couple of months  Rationale for Evaluation and Treatment: Rehabilitation  SUBJECTIVE:   SUBJECTIVE STATEMENT: Pt continues to deny dizziness.  States less pain overall, today.  Pt accompanied by: self  PERTINENT HISTORY: pre-diabetes, HTN, cervical radiculopathy, OSA  PAIN:  Are you having pain? Yes: NPRS scale: 3/10 Pain location: knees, neck and back Pain description: sharp Aggravating factors: activity Relieving factors: ice, medication  PRECAUTIONS: Fall  WEIGHT BEARING RESTRICTIONS: No  FALLS: Has patient fallen in last 6 months? No  LIVING ENVIRONMENT: Lives with: lives with their spouse lives with wife who is primarily w/c bound and needs assistance with most daily activities Lives in: House/apartment Stairs:  2 story home, but they live on one level.  They have a platform lift to help get to driveway. Has following equipment at home:  Single point cane and Grab bars  PLOF: Independent and Leisure: watching tv and sports  PATIENT GOALS: To be able to function again and get rid of the dizziness.  OBJECTIVE:   DIAGNOSTIC FINDINGS: Unremarkable carotid Doppler ultrasound   COGNITION: Overall cognitive status: Within functional limits for tasks assessed   SENSATION: Reports numbness and tingling down both arms   POSTURE:  rounded shoulders and forward head  Cervical ROM:    Active A/ROM (deg) eval A/ROM (deg) 05/14/22  Flexion 60 60  Extension 20 20  Right lateral flexion 40 35  Left lateral flexion 30 35   Right rotation 50 60  Left rotation 40 60  (Blank rows = not tested)  STRENGTH:   Eval: BUE strength is WFL Bilateral hips 4/5 Right quad/hamstring 4/5  05/14/2022: Bilateral hips 4 to 4+/5 Right quad/hamstring 4 to 4+/5   GAIT: Gait pattern: decreased stride length, antalgic, and wide BOS Distance walked: 100 ft Assistive device utilized: Single point cane Level of assistance: Modified independence Comments: Pt with dyspnea on exertion and increased pain  FUNCTIONAL TESTS:  Eval: Timed up and go (TUG): 19.3 sec without assistive device, RPE of 5/10  04/02/2022: Dynamic Visual Acuity:  Can read 20/20 on chart normally, with fast horizontal head movements can read 20/50 (difference of 4 lines)  04/12/2022: 3 minute walk test:  412 ft with RPE of 8/10  05/11/2022: 3 minute walk test:  445 ft with RPE of 8-9/10  05/14/2022: Dynamic Visual Acuity:  Can read 20/20 on chart normally, with fast horizontal head movements can read 20/30 (difference of 2 lines) Timed up and go (TUG): 12.6 sec without assistive device, RPE of 2/10 5 times sit to/from stand:  18.4 sec without UE  06/05/2022: 5 times sit to/from stand:  14.34 sec without UE  06/26/2022: 3 minute walk test:  518 ft with RPE of 8/10  PATIENT SURVEYS:  Eval: DHI 68 / 100 Physical Score: 18 / 28 Emotional Score: 14 / 36 Functional Score: 36 / 36  04/23/2022: DHI Total Score: 64 / 100 Physical Score: 18 / 28 Emotional Score: 12 / 36 Functional Score: 34 / 36  05/14/2022: DHI Total Score: 12 / 100 Physical Score: 4 / 28 Emotional Score: 2 / 36 Functional Score: 6 / 36  VESTIBULAR ASSESSMENT:  SYMPTOM BEHAVIOR:  Subjective history: Pt reports having dizziness for at least 2 months  Non-Vestibular symptoms: changes in hearing, changes in vision, neck pain, headaches, and nausea/vomiting  Type of dizziness: Blurred Vision, Imbalance (Disequilibrium), and Spinning/Vertigo  Frequency: daily  Duration: varies,  but sometimes more than a couple of hours  Aggravating factors: Induced by motion: occur when walking, looking up at the ceiling, bending down to the ground, driving, and activity in general and "any type of exertion"  Relieving factors: head stationary and closing eyes  Progression of symptoms: unchanged  OCULOMOTOR EXAM:  Ocular Alignment: normal  Ocular ROM: No Limitations  Spontaneous Nystagmus: absent  Gaze-Induced Nystagmus: absent  Smooth Pursuits:  occasional nystagmus noted with tracking towards right side  Saccades: slow   VESTIBULAR - OCULAR REFLEX:   Positive head thrust   POSITIONAL TESTING: Right Dix-Hallpike: no nystagmus Left Dix-Hallpike: no nystagmus    OTHOSTATICS: not done   VESTIBULAR TREATMENT:  DATE: 06/26/2022 3 Minute ambulation around PT clinic for 518 ft Seated shoulder ER and horizontal abduction with green tband 2x10 each Seated rows and shoulder extension with green tband 2x10 each bilat Seated hamstring curl with green tband 2x10 bilat Sit to/from stand 2x5   DATE: 06/19/2022 Nustep level 3 x6 min with PT present to discuss status Seated shoulder ER and horizontal abduction with green tband 2x10 each Seated rows and shoulder extension with green tband 2x10 each bilat Seated hamstring curl with green tband 2x10 bilat Seated hip abduction with green tband 2x10 bilat Sit to/from stand 2x5 Seated with 1.5#:  LAQ, hip side step/back, marching.  2x10 bilat each   DATE: 06/12/2022 Nustep level 3 x4 min with PT present to discuss status Seated shoulder ER and horizontal abduction with green tband 2x10 each Seated upper trap stretch 2x20 sec bilat Seated SNAGS for rotation and extension 2x10 each Standing rows and shoulder extension with green tband 2x10 each bilat    PATIENT EDUCATION: Education details: Pt issued HEP Person educated:  Patient Education method: Explanation, Demonstration, and Handouts Education comprehension: verbalized understanding and returned demonstration  HOME EXERCISE PROGRAM: Access Code: [email or web address] URL: [email or web address] Date: 05/11/2022 Prepared by: Shelby Dubin Shayden [place]  Exercises - Pencil Pushups  - 2 x daily - 7 x weekly - 2 sets - 10 reps - Seated Vertical Smooth Pursuit  - 2 x daily - 7 x weekly - 2 sets - 10 reps - Seated Diagonal Smooth Pursuit  - 2 x daily - 7 x weekly - 2 sets - 10 reps - Seated Horizontal Smooth Pursuit  - 2 x daily - 7 x weekly - 2 sets - 10 reps - Seated Gaze Stabilization with Head Rotation  - 1 x daily - 7 x weekly - 3 sets - 10 reps - Seated Gaze Stabilization with Head Nod  - 1 x daily - 7 x weekly - 3 sets - 10 reps - Seated Cervical Retraction  - 1 x daily - 7 x weekly - 2 sets - 10 reps - Seated Scapular Retraction  - 1 x daily - 7 x weekly - 2 sets - 10 reps - Seated Upper Trapezius Stretch  - 1 x daily - 7 x weekly - 1 sets - 2 reps - 20 sec hold - Seated Assisted Cervical Rotation with Towel  - 1 x daily - 7 x weekly - 2 sets - 10 reps - Cervical Extension AROM with Strap  - 1 x daily - 7 x weekly - 2 sets - 10 reps - Shoulder External Rotation and Scapular Retraction with Resistance  - 1 x daily - 7 x weekly - 2 sets - 10 reps - Standing Shoulder Horizontal Abduction with Resistance  - 1 x daily - 7 x weekly - 2 sets - 10 reps - Standing Shoulder Row with Anchored Resistance  - 1 x daily - 7 x weekly - 2 sets - 10 reps  GOALS: Goals reviewed with patient? Yes  SHORT TERM GOALS: Target date: 04/11/2022   Pt will be independent with initial HEP. Baseline: Goal status: MET  2.  Pt will report at least a 30% improvement in dizziness. Baseline:  Goal status: MET   LONG TERM GOALS: Target date: 07/06/2022    Pt will be independent with advanced HEP. Baseline:  Goal status: MET on 06/26/2022  2.  Pt will improve Dizziness Handicapped  Inventory to no greater than 50% to demonstrate pts improved functional independence. Baseline: 68/100 Goal  status: MET on 05/14/2022  3.  Pt will increase hip and knee strength to at least 4+/5 to allow him to perform transfers with improved ease. Baseline:  Goal status: IN PROGRESS  4.  Pt will be able to ambulate at least 15 minutes with RPE no greater than 5/10 and without a loss of balance. Baseline: RPE of 5/10 with TUG (on 05/11/22, RPE of 8-9/10 with 3 minute walk test) Goal status: IN PROGRESS   ASSESSMENT:  CLINICAL IMPRESSION: Mr Garcia presents to skilled PT with reports of less pain overall.  Patient able to ambulate increased distance in 3 minute walk test, but continues to be limited with dyspnea following.  Patient states that pain goes away when he returns to sitting.  Patient continues to deny dizziness.  Limited during standing activities secondary to overall pain with increased standing and ambulation.  Anticipate discharge from services next session with most goals met.    OBJECTIVE IMPAIRMENTS: Abnormal gait, decreased activity tolerance, decreased balance, difficulty walking, decreased strength, dizziness, impaired flexibility, postural dysfunction, obesity, and pain.   ACTIVITY LIMITATIONS: bending, standing, squatting, stairs, locomotion level, and caring for others  PARTICIPATION LIMITATIONS: cleaning and community activity  PERSONAL FACTORS: 3+ comorbidities: obesity, HTN, pre-diabetes, obstructive sleep apnea, cervical radiculopathy  are also affecting patient's functional outcome.   REHAB POTENTIAL: Good  CLINICAL DECISION MAKING: Evolving/moderate complexity  EVALUATION COMPLEXITY: Moderate   PLAN:  PT FREQUENCY: 1-2x/week  PT DURATION: 8 weeks  PLANNED INTERVENTIONS: Therapeutic exercises, Therapeutic activity, Neuromuscular re-education, Balance training, Gait training, Patient/Family education, Self Care, Joint mobilization, Joint manipulation,  Stair training, Vestibular training, Canalith repositioning, Aquatic Therapy, Dry Needling, Electrical stimulation, Spinal manipulation, Spinal mobilization, Cryotherapy, Moist heat, Taping, Ultrasound, Ionotophoresis 59m/ml Dexamethasone, Manual therapy, and Re-evaluation  PLAN FOR NEXT SESSION: Discharge Visit anticipated   SDysart PT, DPT 06/26/2022, 1:11 PM   [facility] [address] [place] [address]Phone # [phone] [phone]

## 2022-07-03 ENCOUNTER — Ambulatory Visit: Payer: HMO | Admitting: Rehabilitative and Restorative Service Providers"

## 2022-07-03 ENCOUNTER — Encounter: Payer: Self-pay | Admitting: Rehabilitative and Restorative Service Providers"

## 2022-07-03 DIAGNOSIS — M6281 Muscle weakness (generalized): Secondary | ICD-10-CM

## 2022-07-03 DIAGNOSIS — R2689 Other abnormalities of gait and mobility: Secondary | ICD-10-CM

## 2022-07-03 DIAGNOSIS — R42 Dizziness and giddiness: Secondary | ICD-10-CM | POA: Diagnosis not present

## 2022-07-03 DIAGNOSIS — R252 Cramp and spasm: Secondary | ICD-10-CM

## 2022-07-03 DIAGNOSIS — M542 Cervicalgia: Secondary | ICD-10-CM

## 2022-07-03 NOTE — Therapy (Signed)
OUTPATIENT PHYSICAL THERAPY TREATMENT NOTE AND DISCHARGE SUMMARY     Patient Name: James [name] MRN: [id number] DOB:11/14/1955, 67 y.o., male Today's Date: 07/03/2022        PT End of Session - 07/03/22 1234     Visit Number 18    Date for PT Re-Evaluation 07/06/22    Authorization Type [employer]    Progress Note Due on Visit 20    PT Start Time 1230    PT Stop Time 1300    PT Time Calculation (min) 30 min    Activity Tolerance Patient tolerated treatment well    Behavior During Therapy WFL for tasks assessed/performed             Past Medical History:  Diagnosis Date   Arthritis    Bilateral swelling of feet    Chicken pox    Complication of anesthesia    difficulty waking up   Depression    Hypertension    Joint pain    Obesity    Osteoarthritis    Prediabetes    Shortness of breath    Swelling    feet and legs   Vitamin D deficiency    Past Surgical History:  Procedure Laterality Date   cartilage removal     COLONOSCOPY WITH PROPOFOL N/A 12/14/2019   Procedure: COLONOSCOPY WITH PROPOFOL;  Surgeon: Doran Stabler, MD;  Location: [facility];  Service: Gastroenterology;  Laterality: N/A;   KNEE SURGERY  2000   POLYPECTOMY  12/14/2019   Procedure: POLYPECTOMY;  Surgeon: Doran Stabler, MD;  Location: [facility];  Service: Gastroenterology;;   TOTAL HIP ARTHROPLASTY Left 02/11/2019   Procedure: TOTAL HIP [place];  Surgeon: Gaynelle Arabian, MD;  Location: [facility];  Service: Orthopedics;  Laterality: Left;  131mn   Patient Active Problem List   Diagnosis Date Noted   Depression 09/08/2020   Special screening for malignant neoplasms, colon    Benign neoplasm of rectum    OA (osteoarthritis) of hip 02/11/2019   Other hyperlipidemia 04/10/2017   Vitamin D deficiency 04/10/2017   Prediabetes 12/17/2016   Class 3 severe obesity with serious comorbidity and body mass index (BMI) of 50.0 to 59.9 in adult ([place] 12/17/2016    At risk for diabetes mellitus 10/02/2016   Hyperglycemia 09/04/2016   Severe obesity (BMI >= 40) ([place]ak 01/15/2013   OBESITY, MORBID 01/20/2007   Essential hypertension 10/31/2006    PCP: NDorothyann Peng NP REFERRING PROVIDER: NDorothyann Peng NP  REFERRING DIAG: R42 (ICD-10-CM) - Vertigo  THERAPY DIAG:  Muscle weakness (generalized)  Other abnormalities of gait and mobility  Cervicalgia  Cramp and spasm  Dizziness and giddiness  ONSET DATE: At least a couple of months  Rationale for Evaluation and Treatment: Rehabilitation  SUBJECTIVE:   SUBJECTIVE STATEMENT: Pt continues to deny dizziness.  Pt reports that he is still using the motorized cart for shopping, states that 3-5 min on his feet is his max.  Pt accompanied by: self  PERTINENT HISTORY: pre-diabetes, HTN, cervical radiculopathy, OSA  PAIN:  Are you having pain? Yes: NPRS scale: 3/10 Pain location: knees, neck and back Pain description: sharp Aggravating factors: activity Relieving factors: ice, medication  PRECAUTIONS: Fall  WEIGHT BEARING RESTRICTIONS: No  FALLS: Has patient fallen in last 6 months? No  LIVING ENVIRONMENT: Lives with: lives with their spouse lives with wife who is primarily w/c bound and needs assistance with most daily activities Lives in: House/apartment Stairs:  2 story home, but they  live on one level.  They have a platform lift to help get to driveway. Has following equipment at home: Single point cane and Grab bars  PLOF: Independent and Leisure: watching tv and sports  PATIENT GOALS: To be able to function again and get rid of the dizziness.  OBJECTIVE:   DIAGNOSTIC FINDINGS: Unremarkable carotid Doppler ultrasound   COGNITION: Overall cognitive status: Within functional limits for tasks assessed   SENSATION: Reports numbness and tingling down both arms   POSTURE:  rounded shoulders and forward head  Cervical ROM:    Active A/ROM (deg) eval A/ROM (deg) 05/14/22   Flexion 60 60  Extension 20 20  Right lateral flexion 40 35  Left lateral flexion 30 35  Right rotation 50 60  Left rotation 40 60  (Blank rows = not tested)  STRENGTH:   Eval: BUE strength is WFL Bilateral hips 4/5 Right quad/hamstring 4/5  05/14/2022: Bilateral hips 4 to 4+/5 Right quad/hamstring 4 to 4+/5  07/03/2022: BLE strength grossly 5- to 5/5 throughout   GAIT: Gait pattern: decreased stride length, antalgic, and wide BOS Distance walked: 100 ft Assistive device utilized: Single point cane Level of assistance: Modified independence Comments: Pt with dyspnea on exertion and increased pain  FUNCTIONAL TESTS:  Eval: Timed up and go (TUG): 19.3 sec without assistive device, RPE of 5/10  04/02/2022: Dynamic Visual Acuity:  Can read 20/20 on chart normally, with fast horizontal head movements can read 20/50 (difference of 4 lines)  04/12/2022: 3 minute walk test:  412 ft with RPE of 8/10  05/11/2022: 3 minute walk test:  445 ft with RPE of 8-9/10  05/14/2022: Dynamic Visual Acuity:  Can read 20/20 on chart normally, with fast horizontal head movements can read 20/30 (difference of 2 lines) Timed up and go (TUG): 12.6 sec without assistive device, RPE of 2/10 5 times sit to/from stand:  18.4 sec without UE  06/05/2022: 5 times sit to/from stand:  14.34 sec without UE  06/26/2022: 3 minute walk test:  518 ft with RPE of 8/10  PATIENT SURVEYS:  Eval: DHI 68 / 100 Physical Score: 18 / 28 Emotional Score: 14 / 36 Functional Score: 36 / 36  04/23/2022: DHI Total Score: 64 / 100 Physical Score: 18 / 28 Emotional Score: 12 / 36 Functional Score: 34 / 36  05/14/2022: DHI Total Score: 12 / 100 Physical Score: 4 / 28 Emotional Score: 2 / 36 Functional Score: 6 / 36  VESTIBULAR ASSESSMENT:  SYMPTOM BEHAVIOR:  Subjective history: Pt reports having dizziness for at least 2 months  Non-Vestibular symptoms: changes in hearing, changes in vision, neck pain,  headaches, and nausea/vomiting  Type of dizziness: Blurred Vision, Imbalance (Disequilibrium), and Spinning/Vertigo  Frequency: daily  Duration: varies, but sometimes more than a couple of hours  Aggravating factors: Induced by motion: occur when walking, looking up at the ceiling, bending down to the ground, driving, and activity in general and "any type of exertion"  Relieving factors: head stationary and closing eyes  Progression of symptoms: unchanged  OCULOMOTOR EXAM:  Ocular Alignment: normal  Ocular ROM: No Limitations  Spontaneous Nystagmus: absent  Gaze-Induced Nystagmus: absent  Smooth Pursuits:  occasional nystagmus noted with tracking towards right side  Saccades: slow   VESTIBULAR - OCULAR REFLEX:   Positive head thrust   POSITIONAL TESTING: Right Dix-Hallpike: no nystagmus Left Dix-Hallpike: no nystagmus    OTHOSTATICS: not done   VESTIBULAR TREATMENT:  DATE: 07/03/2022 Seated shoulder ER and horizontal abduction with green tband 2x10 each Seated rows and shoulder extension with green tband 2x10 each bilat Seated hip abduction with green tband 2x10 bilat Seated hamstring curl with green tband 2x10 bilat Sit to/from stand 2x5 Seated with 1.5#:  LAQ, hip side step/back, marching.  2x10 bilat each   DATE: 06/26/2022 3 Minute ambulation around PT clinic for 518 ft Seated shoulder ER and horizontal abduction with green tband 2x10 each Seated rows and shoulder extension with green tband 2x10 each bilat Seated hamstring curl with green tband 2x10 bilat Sit to/from stand 2x5   DATE: 06/19/2022 Nustep level 3 x6 min with PT present to discuss status Seated shoulder ER and horizontal abduction with green tband 2x10 each Seated rows and shoulder extension with green tband 2x10 each bilat Seated hamstring curl with green tband 2x10 bilat Seated hip abduction with green  tband 2x10 bilat Sit to/from stand 2x5 Seated with 1.5#:  LAQ, hip side step/back, marching.  2x10 bilat each    PATIENT EDUCATION: Education details: Pt issued HEP Person educated: Patient Education method: Explanation, Demonstration, and Handouts Education comprehension: verbalized understanding and returned demonstration  HOME EXERCISE PROGRAM: Access Code: [email or web address] URL: [email or web address] Date: 05/11/2022 Prepared by: Shelby Dubin Joesph Marcy  Exercises - Pencil Pushups  - 2 x daily - 7 x weekly - 2 sets - 10 reps - Seated Vertical Smooth Pursuit  - 2 x daily - 7 x weekly - 2 sets - 10 reps - Seated Diagonal Smooth Pursuit  - 2 x daily - 7 x weekly - 2 sets - 10 reps - Seated Horizontal Smooth Pursuit  - 2 x daily - 7 x weekly - 2 sets - 10 reps - Seated Gaze Stabilization with Head Rotation  - 1 x daily - 7 x weekly - 3 sets - 10 reps - Seated Gaze Stabilization with Head Nod  - 1 x daily - 7 x weekly - 3 sets - 10 reps - Seated Cervical Retraction  - 1 x daily - 7 x weekly - 2 sets - 10 reps - Seated Scapular Retraction  - 1 x daily - 7 x weekly - 2 sets - 10 reps - Seated Upper Trapezius Stretch  - 1 x daily - 7 x weekly - 1 sets - 2 reps - 20 sec hold - Seated Assisted Cervical Rotation with Towel  - 1 x daily - 7 x weekly - 2 sets - 10 reps - Cervical Extension AROM with Strap  - 1 x daily - 7 x weekly - 2 sets - 10 reps - Shoulder External Rotation and Scapular Retraction with Resistance  - 1 x daily - 7 x weekly - 2 sets - 10 reps - Standing Shoulder Horizontal Abduction with Resistance  - 1 x daily - 7 x weekly - 2 sets - 10 reps - Standing Shoulder Row with Anchored Resistance  - 1 x daily - 7 x weekly - 2 sets - 10 reps  GOALS: Goals reviewed with patient? Yes  SHORT TERM GOALS: Target date: 04/11/2022   Pt will be independent with initial HEP. Baseline: Goal status: MET  2.  Pt will report at least a 30% improvement in dizziness. Baseline:  Goal  status: MET   LONG TERM GOALS: Target date: 07/06/2022    Pt will be independent with advanced HEP. Baseline:  Goal status: MET on 06/26/2022  2.  Pt will improve Dizziness Handicapped Inventory to no greater than 50% to demonstrate  pts improved functional independence. Baseline: 68/100 Goal status: MET on 05/14/2022  3.  Pt will increase hip and knee strength to at least 4+/5 to allow him to perform transfers with improved ease. Baseline:  Goal status: MET  4.  Pt will be able to ambulate at least 15 minutes with RPE no greater than 5/10 and without a loss of balance. Baseline: RPE of 5/10 with TUG (on 05/11/22, RPE of 8-9/10 with 3 minute walk test) Goal status: GOAL PARTIALLY MET/PLATEAUED   ASSESSMENT:  CLINICAL IMPRESSION: James [name] presents to skilled PT with continued denial of dizziness.  Patient does admit that he notes that his overall strength and mobility has improved.  Patient with increased strength noted during PT session. Patient states that he continues to have some pain radiating down his arm and he will reach back out to his PCP about further assessment.  Patient is compliant with HEP and states that he feels like he is at a good place and can continue his HEP following discharge.  Patient to follow up with PCP as needed.    OBJECTIVE IMPAIRMENTS: Abnormal gait, decreased activity tolerance, decreased balance, difficulty walking, decreased strength, dizziness, impaired flexibility, postural dysfunction, obesity, and pain.   ACTIVITY LIMITATIONS: bending, standing, squatting, stairs, locomotion level, and caring for others  PARTICIPATION LIMITATIONS: cleaning and community activity  PERSONAL FACTORS: 3+ comorbidities: obesity, HTN, pre-diabetes, obstructive sleep apnea, cervical radiculopathy  are also affecting patient's functional outcome.   REHAB POTENTIAL: Good  CLINICAL DECISION MAKING: Evolving/moderate complexity  EVALUATION COMPLEXITY:  Moderate   PLAN:  PT FREQUENCY: 1-2x/week  PT DURATION: 8 weeks  PLANNED INTERVENTIONS: Therapeutic exercises, Therapeutic activity, Neuromuscular re-education, Balance training, Gait training, Patient/Family education, Self Care, Joint mobilization, Joint manipulation, Stair training, Vestibular training, Canalith repositioning, Aquatic Therapy, Dry Needling, Electrical stimulation, Spinal manipulation, Spinal mobilization, Cryotherapy, Moist heat, Taping, Ultrasound, Ionotophoresis '4mg'$ /ml Dexamethasone, Manual therapy, and Re-evaluation   PHYSICAL THERAPY DISCHARGE SUMMARY   Patient agrees to discharge. Patient goals were  all vestibular goals met (reason for initial referral), but has not met pain goals . Patient is being discharged due to being pleased with the current functional level.    Juel Burrow, PT, DPT 07/03/2022, 1:22 PM   [facility] [address] [place],  [address] Phone # [phone] Fax [phone]

## 2022-08-02 ENCOUNTER — Encounter: Payer: Self-pay | Admitting: Pulmonary Disease

## 2022-08-02 ENCOUNTER — Ambulatory Visit (INDEPENDENT_AMBULATORY_CARE_PROVIDER_SITE_OTHER): Payer: HMO

## 2022-08-02 ENCOUNTER — Ambulatory Visit (INDEPENDENT_AMBULATORY_CARE_PROVIDER_SITE_OTHER): Payer: HMO | Admitting: Pulmonary Disease

## 2022-08-02 VITALS — BP 112/72 | HR 69 | Ht 65.0 in | Wt 353.4 lb

## 2022-08-02 DIAGNOSIS — R0602 Shortness of breath: Secondary | ICD-10-CM

## 2022-08-02 NOTE — Progress Notes (Addendum)
James [name]    [id number]    Jul 21, 1955  Primary Care Physician:Nafziger, Tommi Rumps, NP  Referring Physician: Dorothyann Peng, NP [address] [place],  [place] [address]  Chief complaint:   In for follow-up for severe obstructive sleep apnea  HPI:  Tolerates CPAP regularly Sleeping with CPAP most of the night  Energy levels feels good  Still does have some nasal stuffiness and congestion  Some shortness of breath on exertion  Has no significant difficulty tolerating CPAP Denies any significant mask issues  Patient is getting about 7 hours of sleep every night  Usually goes to bed between 1030 and 11 Falls asleep in 10 to 30 minutes 2-5 awakenings Final wake up time between 730 and 830  Continues to work on weight loss Both parents snored  Reformed smoker-quit over 25 years ago  Memory is good, able to drive safely, no morning headaches  Outpatient Encounter Medications as of 08/02/2022  Medication Sig   acetaminophen (TYLENOL) 500 MG tablet Take 1,000 mg by mouth at bedtime as needed for moderate pain.   amoxicillin (AMOXIL) 500 MG capsule Take 2,000 mg by mouth See admin instructions. Take 2000 mg 1 hour prior to dental work   cetirizine (ZYRTEC) 10 MG tablet Take 10 mg by mouth daily.   cholecalciferol (VITAMIN D3) 25 MCG (1000 UNIT) tablet Take 2,000 Units by mouth daily.   fluticasone (FLONASE) 50 MCG/ACT nasal spray Place 1 spray into both nostrils daily.    hydrochlorothiazide (HYDRODIURIL) 25 MG tablet TAKE 1 TABLET (25 MG TOTAL) BY MOUTH DAILY.   ibuprofen (ADVIL) 200 MG tablet Take 400 mg by mouth at bedtime as needed for moderate pain.   Ketotifen Fumarate (ALLERGY EYE DROPS OP) Place 1 drop into both eyes daily as needed (allergies).   lidocaine (LMX) 4 % cream Apply 1 application topically daily as needed (pain).   lisinopril (ZESTRIL) 10 MG tablet TAKE 2 TABLETS BY MOUTH EVERY DAY   Multiple Vitamin (MULTIVITAMIN) tablet Take 1 tablet  by mouth daily.   No facility-administered encounter medications on file as of 08/02/2022.    Allergies as of 08/02/2022   (No Known Allergies)    Past Medical History:  Diagnosis Date   Arthritis    Bilateral swelling of feet    Chicken pox    Complication of anesthesia    difficulty waking up   Depression    Hypertension    Joint pain    Obesity    Osteoarthritis    Prediabetes    Shortness of breath    Swelling    feet and legs   Vitamin D deficiency     Past Surgical History:  Procedure Laterality Date   cartilage removal     COLONOSCOPY WITH PROPOFOL N/A 12/14/2019   Procedure: COLONOSCOPY WITH PROPOFOL;  Surgeon: Doran Stabler, MD;  Location: [facility];  Service: Gastroenterology;  Laterality: N/A;   KNEE SURGERY  2000   POLYPECTOMY  12/14/2019   Procedure: POLYPECTOMY;  Surgeon: Doran Stabler, MD;  Location: [facility];  Service: Gastroenterology;;   TOTAL HIP ARTHROPLASTY Left 02/11/2019   Procedure: TOTAL HIP [place];  Surgeon: Gaynelle Arabian, MD;  Location: [facility];  Service: Orthopedics;  Laterality: Left;  180min    Family History  Problem Relation Age of Onset   Cancer Mother        lung and soft tissue cancer from radiation   Stroke Mother  Obesity Mother    Heart disease Father        CHF   Diabetes Father    Hypertension Father    Obesity Father    Heart disease Maternal Grandfather    Diabetes Paternal Grandmother    Ovarian cancer Sister    Colon cancer Neg Hx    Colon polyps Neg Hx    Esophageal cancer Neg Hx    Rectal cancer Neg Hx    Stomach cancer Neg Hx     Social History   Socioeconomic History   Marital status: Married    Spouse name: Not on file   Number of children: Not on file   Years of education: Not on file   Highest education level: Not on file  Occupational History   Occupation: At home caregiver  Tobacco Use   Smoking status: Former    Types: Cigarettes    Quit date: 07/16/1992     Years since quitting: 30.0   Smokeless tobacco: Never  Substance and Sexual Activity   Alcohol use: Yes    Comment: very little per patient    Drug use: No   Sexual activity: Not on file  Other Topics Concern   Not on file  Social History Narrative   Work or School: self employed, 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      Home Situation: living with and caring for wife whom had stroke and brain tumor      Spiritual Beliefs: Methodist      Lifestyle: no regular exercise, diet poor            Social Determinants of Health   Financial Resource Strain: Low Risk  (06/01/2022)   Overall Financial Resource Strain (CARDIA)    Difficulty of Paying Living Expenses: Not hard at all  Food Insecurity: No Food Insecurity (06/01/2022)   Hunger Vital Sign    Worried About Running Out of Food in the Last Year: Never true    Ran Out of Food in the Last Year: Never true  Transportation Needs: No Transportation Needs (06/01/2022)   PRAPARE - Hydrologist (Medical): No    Lack of Transportation (Non-Medical): No  Physical Activity: Insufficiently Active (06/01/2022)   Exercise Vital Sign    Days of Exercise per Week: 1 day    Minutes of Exercise per Session: 40 min  Stress: No Stress Concern Present (06/01/2022)   [place]    Feeling of Stress : Not at all  Social Connections: [place] (06/01/2022)   Social Connection and Isolation Panel [NHANES]    Frequency of Communication with Friends and Family: More than three times a week    Frequency of Social Gatherings with Friends and Family: More than three times a week    Attends Religious Services: 1 to 4 times per year    Active Member of [employer] or Organizations: Yes    Attends Music therapist: More than 4 times per year    Marital Status: Married  Human resources officer Violence: Not At Risk (06/01/2022)   Humiliation, Afraid, Rape, and  Kick questionnaire    Fear of Current or Ex-Partner: No    Emotionally Abused: No    Physically Abused: No    Sexually Abused: No    Review of Systems  Respiratory:  Positive for apnea.   Psychiatric/Behavioral:  Positive for sleep disturbance.     Vitals:   08/02/22 0915  BP:  112/72  Pulse: 69  SpO2: 97%      Physical Exam Constitutional:      Appearance: He is obese.  HENT:     Head: Normocephalic.     Nose: Nose normal. No congestion.     Mouth/Throat:     Mouth: Mucous membranes are moist.     Comments: Mallampati 4, crowded oropharynx, macroglossia Eyes:     General:        Right eye: No discharge.        Left eye: No discharge.     Pupils: Pupils are equal, round, and reactive to light.  Cardiovascular:     Rate and Rhythm: Normal rate and regular rhythm.     Heart sounds: No murmur heard.    No friction rub.  Pulmonary:     Effort: No respiratory distress.     Breath sounds: No stridor. No wheezing or rhonchi.  Musculoskeletal:     Cervical back: No rigidity or tenderness.  Neurological:     General: No focal deficit present.     Mental Status: He is alert.  Psychiatric:        Mood and Affect: Mood normal.   Compliance data reviewed showing excellent compliance 93% Machine set between 5 and 20 Residual AHI of 14.5 some issues with mask leak   Assessment:  .  Severe obstructive sleep apnea on CPAP therapy -Continues to tolerate CPAP well -Sleeping much better -Energy levels continue to improve -Concerned about elevated AHI  Some concerns with elevated AHI Patient continues to feel well with CPAP use however  Continues to benefit from CPAP use on a nightly basis  Obesity -Encouraged to continue weight loss efforts -Diet and regular exercise   Plan/Recommendations: Continue CPAP on a nightly basis  Will follow-up in about 3 months  Nasal congestion encouraged to try Xyzal in place of Zyrtec  May use Flonase/Nasonex  regularly  Obtain chest x-ray -Chest x-ray shows no acute infiltrate today  Schedule PFT for next visit  Follow-up in 3 months  Encourage graded activities as tolerated     Sherrilyn Rist MD [facility] 08/02/2022, 9:26 AM  CC: Dorothyann Peng, NP

## 2022-08-02 NOTE — Patient Instructions (Addendum)
Continue using your CPAP nightly  With respect to the congestion Continue Zyrtec May consider Xyzal  Flonase/Nasonex regularly  Regarding shortness of breath  -Chest x-ray today -Schedule for PFT at next visit  We will get a download from your machine again in about 3 months  Follow-up in about 3 months  Graded activities as tolerated, escalation as tolerated

## 2022-08-21 IMAGING — DX DG CHEST 2V
2 series · 2 of 2 positions shown · non-contrast
Comparison: Chest radiograph 10/15/2019

CLINICAL DATA: Shortness of breath.

EXAM:
CHEST - 2 VIEW

[chest pa]
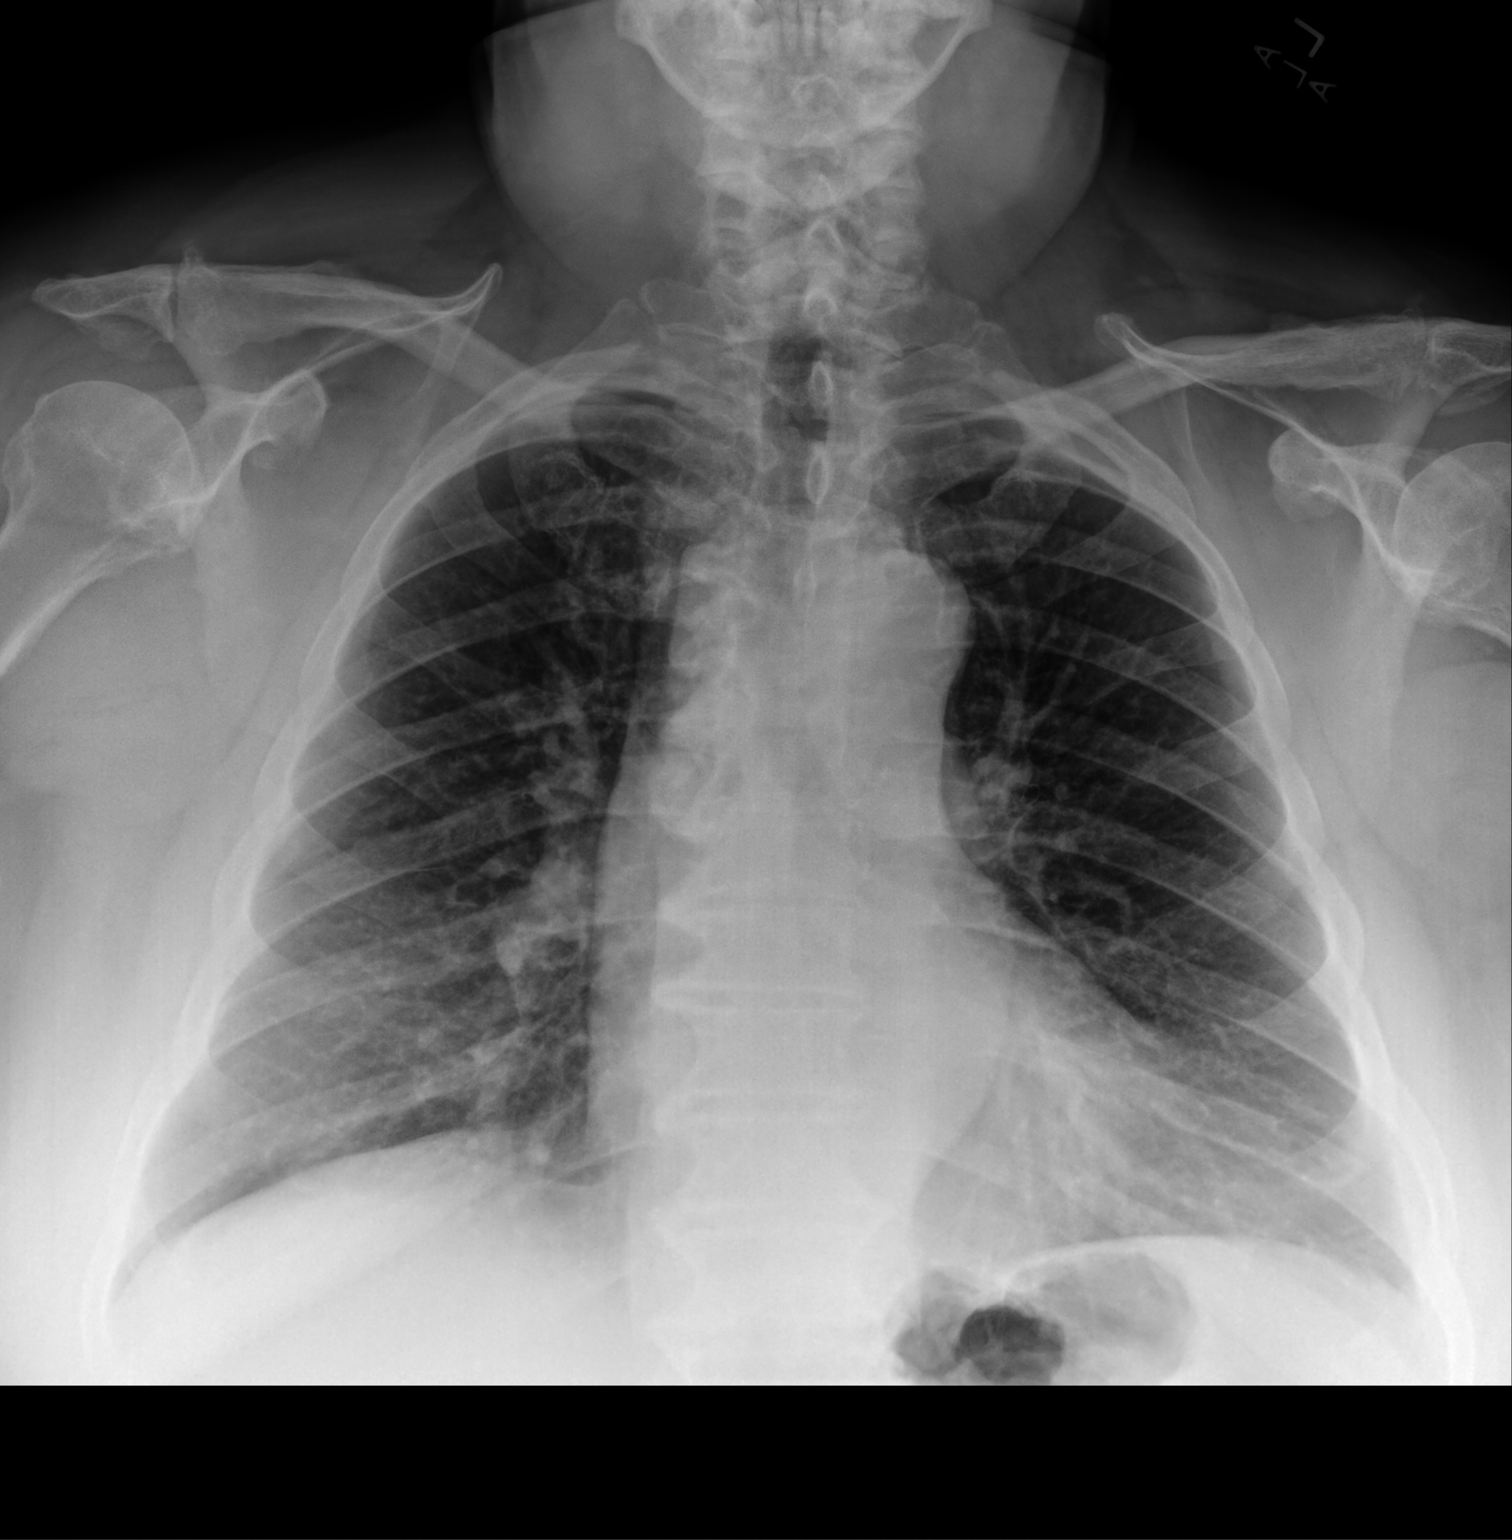

[chest lat]
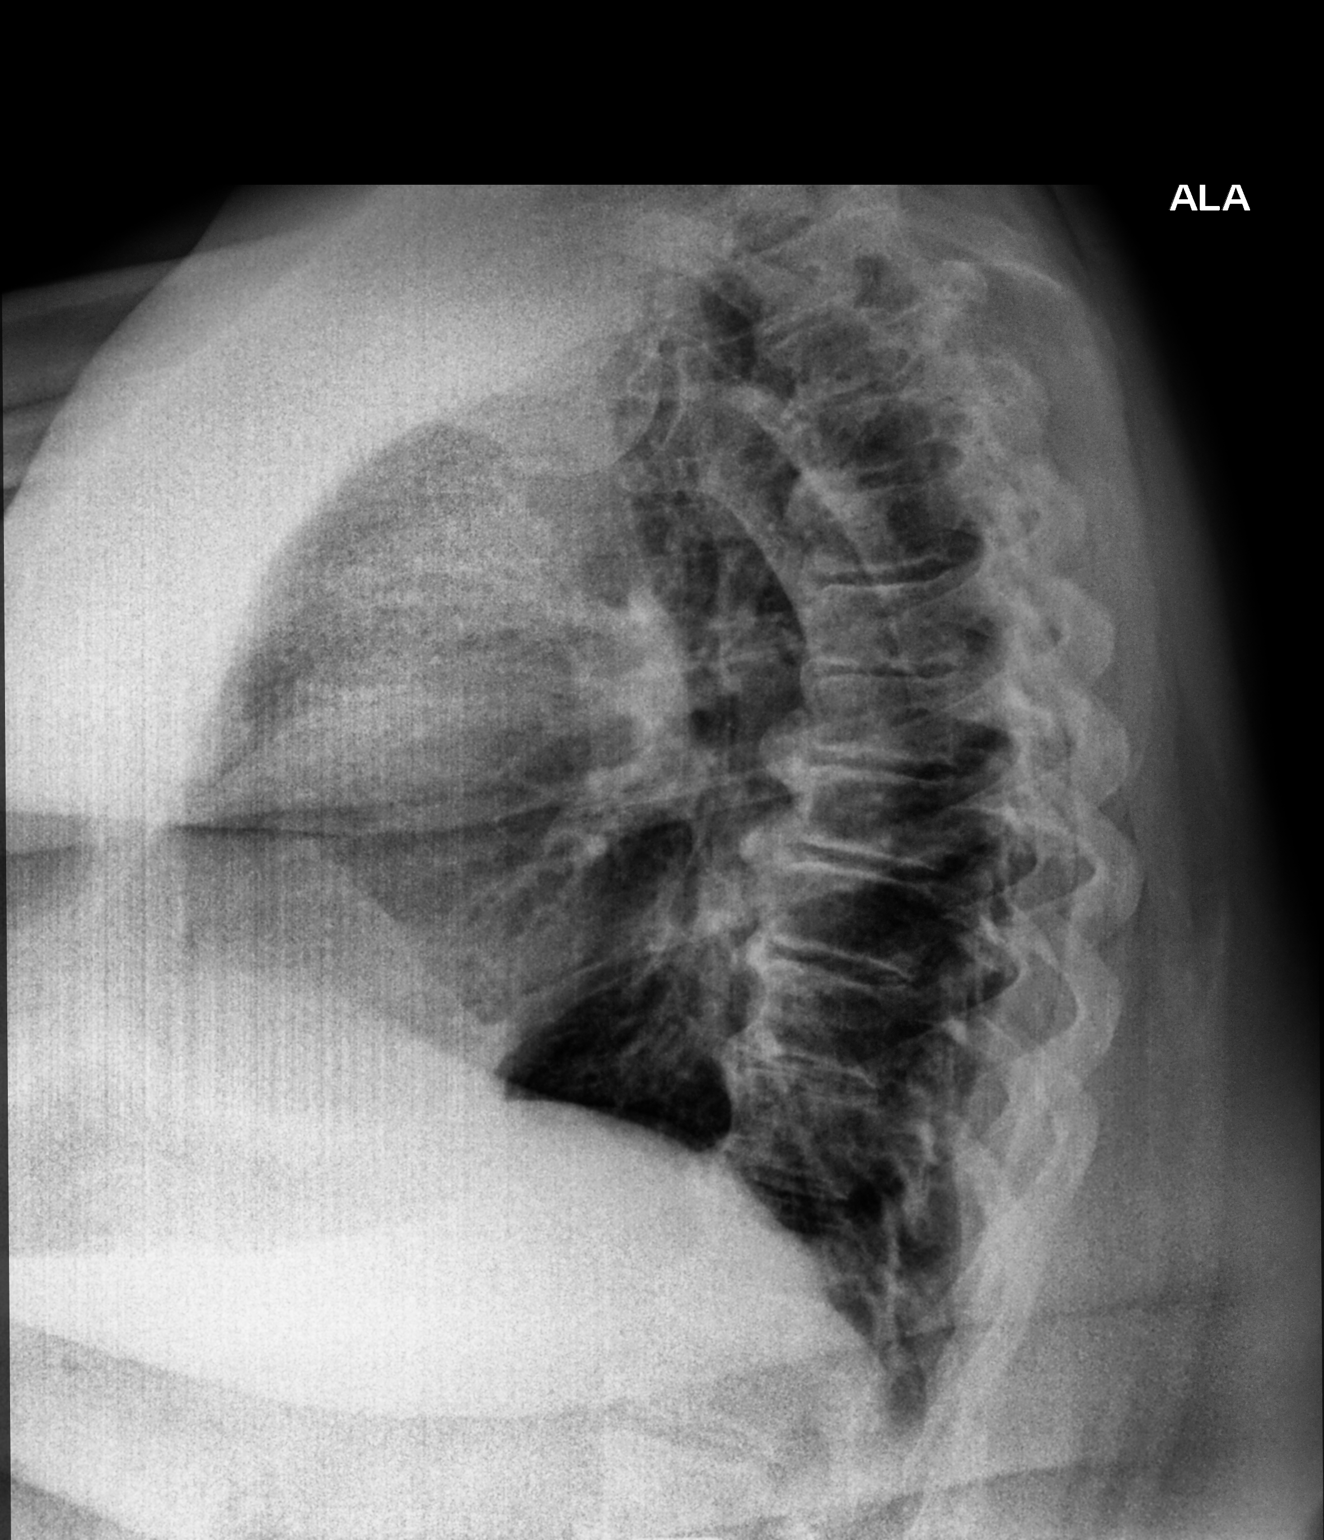

[2 of 2 positions shown; findings below may reference images not displayed]

FINDINGS: Stable cardiac and mediastinal contours. Tortuosity of the thoracic
aorta. Basilar atelectasis. Thoracic spine and right shoulder joint
degenerative changes.
IMPRESSION: No active cardiopulmonary disease.

## 2022-10-10 ENCOUNTER — Ambulatory Visit (INDEPENDENT_AMBULATORY_CARE_PROVIDER_SITE_OTHER): Payer: HMO | Admitting: Pulmonary Disease

## 2022-10-10 ENCOUNTER — Ambulatory Visit: Payer: HMO | Admitting: Pulmonary Disease

## 2022-10-10 ENCOUNTER — Encounter: Payer: Self-pay | Admitting: Pulmonary Disease

## 2022-10-10 VITALS — BP 122/62 | HR 87 | Ht 65.0 in | Wt 320.0 lb

## 2022-10-10 DIAGNOSIS — R0602 Shortness of breath: Secondary | ICD-10-CM

## 2022-10-10 LAB — PULMONARY FUNCTION TEST
DL/VA % pred: 136 %
DL/VA: 5.69 ml/min/mmHg/L
DLCO cor % pred: 109 %
DLCO cor: 24.42 ml/min/mmHg
DLCO unc % pred: 109 %
DLCO unc: 24.42 ml/min/mmHg
FEF 25-75 Post: 2.08 L/s
FEF 25-75 Pre: 1.72 L/s
FEF2575-%Change-Post: 21 %
FEF2575-%Pred-Post: 98 %
FEF2575-%Pred-Pre: 80 %
FEV1-%Change-Post: 8 %
FEV1-%Pred-Post: 76 %
FEV1-%Pred-Pre: 70 %
FEV1-Post: 2.07 L
FEV1-Pre: 1.92 L
FEV1FVC-%Change-Post: 5 %
FEV1FVC-%Pred-Pre: 102 %
FEV6-%Change-Post: 2 %
FEV6-%Pred-Post: 74 %
FEV6-%Pred-Pre: 73 %
FEV6-Post: 2.6 L
FEV6-Pre: 2.54 L
FEV6FVC-%Pred-Post: 106 %
FEV6FVC-%Pred-Pre: 106 %
FVC-%Change-Post: 2 %
FVC-%Pred-Post: 70 %
FVC-%Pred-Pre: 68 %
FVC-Post: 2.61 L
FVC-Pre: 2.54 L
Post FEV1/FVC ratio: 79 %
Post FEV6/FVC ratio: 100 %
Pre FEV1/FVC ratio: 76 %
Pre FEV6/FVC Ratio: 100 %
RV % pred: 74 %
RV: 1.58 L
TLC % pred: 70 %
TLC: 4.23 L

## 2022-10-10 NOTE — Progress Notes (Signed)
James [name]    [id number]    [birth year]  Primary Care Physician:Nafziger, Kandee Keen, NP  Referring Physician: Shirline Frees, NP [address] [place],  [place] [address]  Chief complaint:   In for follow-up for severe obstructive sleep apnea  HPI:  Tolerating CPAP well Sleeps well most nights  Energy levels feel better  Did have some nasal stuffiness and congestion earlier in the year Has been using Flonase, generic Zyrtec Feels allergy symptoms are better generally  Exercise tolerance remains about the same  Does have shortness of breath with exertion  Has no significant difficulty tolerating CPAP Denies any significant mask issues  Patient is getting about 7 hours of sleep every night  Usually goes to bed between 1030 and 11 Falls asleep in 10 to 30 minutes 2-5 awakenings Final wake up time between 730 and 830  Continues to work on weight loss Both parents snored  Reformed smoker-quit over 25 years ago  Memory is good, able to drive safely, no morning headaches  Outpatient Encounter Medications as of 10/10/2022  Medication Sig   acetaminophen (TYLENOL) 500 MG tablet Take 1,000 mg by mouth at bedtime as needed for moderate pain.   amoxicillin (AMOXIL) 500 MG capsule Take 2,000 mg by mouth See admin instructions. Take 2000 mg 1 hour prior to dental work   cetirizine (ZYRTEC) 10 MG tablet Take 10 mg by mouth daily.   cholecalciferol (VITAMIN D3) 25 MCG (1000 UNIT) tablet Take 2,000 Units by mouth daily.   fluticasone (FLONASE) 50 MCG/ACT nasal spray Place 1 spray into both nostrils daily.    hydrochlorothiazide (HYDRODIURIL) 25 MG tablet TAKE 1 TABLET (25 MG TOTAL) BY MOUTH DAILY.   ibuprofen (ADVIL) 200 MG tablet Take 400 mg by mouth at bedtime as needed for moderate pain.   Ketotifen Fumarate (ALLERGY EYE DROPS OP) Place 1 drop into both eyes daily as needed (allergies).   lidocaine (LMX) 4 % cream Apply 1 application topically daily as needed  (pain).   lisinopril (ZESTRIL) 10 MG tablet TAKE 2 TABLETS BY MOUTH EVERY DAY   Multiple Vitamin (MULTIVITAMIN) tablet Take 1 tablet by mouth daily.   No facility-administered encounter medications on file as of 10/10/2022.    Allergies as of 10/10/2022   (No Known Allergies)    Past Medical History:  Diagnosis Date   Arthritis    Bilateral swelling of feet    Chicken pox    Complication of anesthesia    difficulty waking up   Depression    Hypertension    Joint pain    Obesity    Osteoarthritis    Prediabetes    Shortness of breath    Swelling    feet and legs   Vitamin D deficiency     Past Surgical History:  Procedure Laterality Date   cartilage removal     COLONOSCOPY WITH PROPOFOL N/A 12/14/2019   Procedure: COLONOSCOPY WITH PROPOFOL;  Surgeon: Sherrilyn Rist, MD;  Location: [facility];  Service: Gastroenterology;  Laterality: N/A;   KNEE SURGERY  2000   POLYPECTOMY  12/14/2019   Procedure: POLYPECTOMY;  Surgeon: Sherrilyn Rist, MD;  Location: [facility];  Service: Gastroenterology;;   TOTAL HIP ARTHROPLASTY Left 02/11/2019   Procedure: TOTAL HIP ARTHROPLASTY-POSTERIOR;  Surgeon: Ollen Gross, MD;  Location: [facility];  Service: Orthopedics;  Laterality: Left;     Family History  Problem Relation Age of Onset   Cancer Mother  lung and soft tissue cancer from radiation   Stroke Mother    Obesity Mother    Heart disease Father        CHF   Diabetes Father    Hypertension Father    Obesity Father    Heart disease Maternal Grandfather    Diabetes Paternal Grandmother    Ovarian cancer Sister    Colon cancer Neg Hx    Colon polyps Neg Hx    Esophageal cancer Neg Hx    Rectal cancer Neg Hx    Stomach cancer Neg Hx     Social History   Socioeconomic History   Marital status: Married    Spouse name: Not on file   Number of children: Not on file   Years of education: Not on file   Highest education level: Not on file  Occupational  History   Occupation: At home caregiver  Tobacco Use   Smoking status: Former    Types: Cigarettes    Quit date: 07/16/1992    Years since quitting: 30.2   Smokeless tobacco: Never  Substance and Sexual Activity   Alcohol use: Yes    Comment: very little per patient    Drug use: No   Sexual activity: Not on file  Other Topics Concern   Not on file  Social History Narrative   Work or School: self employed, 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      Home Situation: living with and caring for wife whom had stroke and brain tumor      Spiritual Beliefs: Methodist      Lifestyle: no regular exercise, diet poor            Social Determinants of Health   Financial Resource Strain: Low Risk  (06/01/2022)   Overall Financial Resource Strain (CARDIA)    Difficulty of Paying Living Expenses: Not hard at all  Food Insecurity: No Food Insecurity (06/01/2022)   Hunger Vital Sign    Worried About Running Out of Food in the Last Year: Never true    Ran Out of Food in the Last Year: Never true  Transportation Needs: No Transportation Needs (06/01/2022)   PRAPARE - Administrator, Civil Service (Medical): No    Lack of Transportation (Non-Medical): No  Physical Activity: Insufficiently Active (06/01/2022)   Exercise Vital Sign    Days of Exercise per Week: 1 day    Minutes of Exercise per Session: 40 min  Stress: No Stress Concern Present (06/01/2022)   [employer] of Occupational Health - Occupational Stress Questionnaire    Feeling of Stress : Not at all  Social Connections: Socially Integrated (06/01/2022)   Social Connection and Isolation Panel [NHANES]    Frequency of Communication with Friends and Family: More than three times a week    Frequency of Social Gatherings with Friends and Family: More than three times a week    Attends Religious Services: 1 to 4 times per year    Active Member of [employer] or Organizations: Yes    Attends Engineer, structural: More than 4  times per year    Marital Status: Married  Catering manager Violence: Not At Risk (06/01/2022)   Humiliation, Afraid, Rape, and Kick questionnaire    Fear of Current or Ex-Partner: No    Emotionally Abused: No    Physically Abused: No    Sexually Abused: No    Review of Systems  Respiratory:  Positive for apnea.   Psychiatric/Behavioral:  Positive  for sleep disturbance.     Vitals:   10/10/22 1323  BP: 122/62  Pulse: 87  SpO2: 95%      Physical Exam Constitutional:      Appearance: He is obese.  HENT:     Head: Normocephalic.     Nose: Nose normal. No congestion.     Mouth/Throat:     Mouth: Mucous membranes are moist.     Comments: Mallampati 4, crowded oropharynx, macroglossia Eyes:     General:        Right eye: No discharge.        Left eye: No discharge.     Pupils: Pupils are equal, round, and reactive to light.  Cardiovascular:     Rate and Rhythm: Normal rate and regular rhythm.     Heart sounds: No murmur heard.    No friction rub.  Pulmonary:     Effort: No respiratory distress.     Breath sounds: No stridor. No wheezing or rhonchi.  Musculoskeletal:     Cervical back: No rigidity or tenderness.  Neurological:     General: No focal deficit present.     Mental Status: He is alert.  Psychiatric:        Mood and Affect: Mood normal.    Compliance data reviewed showing excellent compliance with CPAP auto setting of 5-20 residual AHI of 9.8, better than previous of 14.8  PFT with mild restrictive disease likely related to body habitus  Assessment:  .  Severe obstructive sleep apnea on CPAP therapy -Continues to benefit from CPAP -Continues to tolerate CPAP well -Energy levels better -Residual AHI numbers better  Some concerns with elevated AHI Patient continues to feel well with CPAP use however  Continues to benefit from CPAP use on a nightly basis  Obesity -Encouraged to continue weight loss efforts -Diet and regular  exercise   Plan/Recommendations: Continue CPAP on a nightly basis  Encouraged to institute graded activities as tolerated  Continue medications for allergies including Zyrtec, nasal steroid spray  Follow-up a year from now   Virl Diamond MD [facility] 10/10/2022, 1:43 PM  CC: Shirline Frees, NP

## 2022-10-10 NOTE — Patient Instructions (Signed)
I will see you about a year from now  Continue CPAP on a nightly basis  Graded activities as tolerated  You will PFT was reassuring as we reviewed during the visit today  Call us with significant concerns

## 2022-10-10 NOTE — Progress Notes (Signed)
Full PFT performed today. °

## 2022-10-10 NOTE — Patient Instructions (Signed)
Full PFT performed today. °

## 2022-10-29 ENCOUNTER — Other Ambulatory Visit: Payer: Self-pay | Admitting: Adult Health

## 2022-10-29 DIAGNOSIS — I10 Essential (primary) hypertension: Secondary | ICD-10-CM

## 2022-10-31 ENCOUNTER — Ambulatory Visit (INDEPENDENT_AMBULATORY_CARE_PROVIDER_SITE_OTHER): Payer: HMO | Admitting: Adult Health

## 2022-10-31 ENCOUNTER — Encounter: Payer: Self-pay | Admitting: Adult Health

## 2022-10-31 VITALS — BP 110/60 | HR 82 | Temp 98.2°F | Ht 65.0 in | Wt 353.0 lb

## 2022-10-31 DIAGNOSIS — R252 Cramp and spasm: Secondary | ICD-10-CM | POA: Diagnosis not present

## 2022-10-31 DIAGNOSIS — R29898 Other symptoms and signs involving the musculoskeletal system: Secondary | ICD-10-CM

## 2022-10-31 DIAGNOSIS — R7303 Prediabetes: Secondary | ICD-10-CM | POA: Diagnosis not present

## 2022-10-31 LAB — BASIC METABOLIC PANEL
BUN: 14 mg/dL (ref 6–23)
CO2: 30 mEq/L (ref 19–32)
Calcium: 9.7 mg/dL (ref 8.4–10.5)
Chloride: 96 mEq/L (ref 96–112)
Creatinine, Ser: 0.99 mg/dL (ref 0.40–1.50)
GFR: 78.06 mL/min (ref >60.00)
Glucose, Bld: 94 mg/dL (ref 70–99)
Potassium: 3.7 mEq/L (ref 3.5–5.1)
Sodium: 135 mEq/L (ref 135–145)

## 2022-10-31 LAB — MAGNESIUM: Magnesium: 1.8 mg/dL (ref 1.5–2.5)

## 2022-10-31 LAB — VITAMIN D 25 HYDROXY (VIT D DEFICIENCY, FRACTURES): VITD: 34.85 ng/mL (ref 30.00–100.00)

## 2022-10-31 LAB — HEMOGLOBIN A1C: Hgb A1c MFr Bld: 6 % (ref 4.6–6.5)

## 2022-10-31 NOTE — Patient Instructions (Signed)
It was great meeting you today   I am going to do some lab work and refer you to a vascular specialist to make sure there are no blockages in your legs

## 2022-10-31 NOTE — Progress Notes (Signed)
Subjective:    Patient ID: James [name], male    DOB: Sep 11, 1955, 67 y.o.   MRN: [id number]  Leg Pain    67 year old male who  has a past medical history of Arthritis, Bilateral swelling of feet, Chicken pox, Complication of anesthesia, Depression, Hypertension, Joint pain, Obesity, Osteoarthritis, Prediabetes, Shortness of breath, Swelling, and Vitamin D deficiency.  He presents to the office today for pain and cramping in both legs. He reports that over the last 2-3 months he has been experiencing consistent pain and cramping in his lower extremities. When he is doing active such as walking his legs feel fatigued. When he is resting or laying down he gets pain and cramping in his lower extremities. The cramping is located throughout his whole leg. Pain and cramping is worse on the right side.   He does take vitamin D but not consistently. He is staying hydrated.   He also wants to check his A1c due to prediabetes Lab Results  Component Value Date   HGBA1C 6.1 02/23/2022      Review of Systems See HPI   Past Medical History:  Diagnosis Date   Arthritis    Bilateral swelling of feet    Chicken pox    Complication of anesthesia    difficulty waking up   Depression    Hypertension    Joint pain    Obesity    Osteoarthritis    Prediabetes    Shortness of breath    Swelling    feet and legs   Vitamin D deficiency     Social History   Socioeconomic History   Marital status: Married    Spouse name: Not on file   Number of children: Not on file   Years of education: Not on file   Highest education level: Associate degree: occupational, Scientist, product/process development, or vocational program  Occupational History   Occupation: At home caregiver  Tobacco Use   Smoking status: Former    Types: Cigarettes    Quit date: 07/16/1992    Years since quitting: 30.3   Smokeless tobacco: Never  Substance and Sexual Activity   Alcohol use: Yes    Comment: very little per patient    Drug use:  No   Sexual activity: Not on file  Other Topics Concern   Not on file  Social History Narrative   Work or School: self employed, 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      Home Situation: living with and caring for wife whom had stroke and brain tumor      Spiritual Beliefs: Methodist      Lifestyle: no regular exercise, diet poor            Social Determinants of Health   Financial Resource Strain: Low Risk  (10/30/2022)   Overall Financial Resource Strain (CARDIA)    Difficulty of Paying Living Expenses: Not very hard  Food Insecurity: No Food Insecurity (10/30/2022)   Hunger Vital Sign    Worried About Running Out of Food in the Last Year: Never true    Ran Out of Food in the Last Year: Never true  Transportation Needs: No Transportation Needs (10/30/2022)   PRAPARE - Administrator, Civil Service (Medical): No    Lack of Transportation (Non-Medical): No  Physical Activity: Inactive (10/30/2022)   Exercise Vital Sign    Days of Exercise per Week: 0 days    Minutes of Exercise per Session: 40 min  Stress: Stress  Concern Present (10/30/2022)   [employer] of Occupational Health - Occupational Stress Questionnaire    Feeling of Stress : To some extent  Social Connections: Moderately Isolated (10/30/2022)   Social Connection and Isolation Panel [NHANES]    Frequency of Communication with Friends and Family: Once a week    Frequency of Social Gatherings with Friends and Family: Never    Attends Religious Services: Never    Database administrator or Organizations: Yes    Attends Engineer, structural: More than 4 times per year    Marital Status: Married  Catering manager Violence: Not At Risk (06/01/2022)   Humiliation, Afraid, Rape, and Kick questionnaire    Fear of Current or Ex-Partner: No    Emotionally Abused: No    Physically Abused: No    Sexually Abused: No    Past Surgical History:  Procedure Laterality Date   cartilage removal     COLONOSCOPY  WITH PROPOFOL N/A 12/14/2019   Procedure: COLONOSCOPY WITH PROPOFOL;  Surgeon: Sherrilyn Rist, MD;  Location: [facility];  Service: Gastroenterology;  Laterality: N/A;   KNEE SURGERY  2000   POLYPECTOMY  12/14/2019   Procedure: POLYPECTOMY;  Surgeon: Sherrilyn Rist, MD;  Location: [facility];  Service: Gastroenterology;;   TOTAL HIP ARTHROPLASTY Left 02/11/2019   Procedure: TOTAL HIP ARTHROPLASTY-POSTERIOR;  Surgeon: Ollen Gross, MD;  Location: [facility];  Service: Orthopedics;  Laterality: Left;     Family History  Problem Relation Age of Onset   Cancer Mother        lung and soft tissue cancer from radiation   Stroke Mother    Obesity Mother    Heart disease Father        CHF   Diabetes Father    Hypertension Father    Obesity Father    Heart disease Maternal Grandfather    Diabetes Paternal Grandmother    Ovarian cancer Sister    Colon cancer Neg Hx    Colon polyps Neg Hx    Esophageal cancer Neg Hx    Rectal cancer Neg Hx    Stomach cancer Neg Hx     No Known Allergies  Current Outpatient Medications on File Prior to Visit  Medication Sig Dispense Refill   cetirizine (ZYRTEC) 10 MG tablet Take 10 mg by mouth daily.     cholecalciferol (VITAMIN D3) 25 MCG (1000 UNIT) tablet Take 2,000 Units by mouth daily.     fluticasone (FLONASE) 50 MCG/ACT nasal spray Place 1 spray into both nostrils daily.      hydrochlorothiazide (HYDRODIURIL) 25 MG tablet TAKE 1 TABLET (25 MG TOTAL) BY MOUTH DAILY. 90 tablet 1   ibuprofen (ADVIL) 200 MG tablet Take 400 mg by mouth at bedtime as needed for moderate pain.     Ketotifen Fumarate (ALLERGY EYE DROPS OP) Place 1 drop into both eyes daily as needed (allergies).     lidocaine (LMX) 4 % cream Apply 1 application topically daily as needed (pain).     lisinopril (ZESTRIL) 10 MG tablet TAKE 2 TABLETS BY MOUTH EVERY DAY 180 tablet 1   Multiple Vitamin (MULTIVITAMIN) tablet Take 1 tablet by mouth daily.     acetaminophen (TYLENOL)  500 MG tablet Take 1,000 mg by mouth at bedtime as needed for moderate pain. (Patient not taking: Reported on 10/31/2022)     amoxicillin (AMOXIL) 500 MG capsule Take 2,000 mg by mouth See admin instructions. Take 2000 mg 1 hour prior to dental  work (Patient not taking: Reported on 10/31/2022)     No current facility-administered medications on file prior to visit.    BP 110/60   Pulse 82   Temp 98.2 F (36.8 C) (Oral)   Ht 5\' 5"  (1.651 m)   Wt (!) 353 lb (160.1 kg)   SpO2 96%   BMI 58.74 kg/m       Objective:   Physical Exam Vitals and nursing note reviewed.  Constitutional:      Appearance: Normal appearance.  Cardiovascular:     Rate and Rhythm: Normal rate and regular rhythm.     Pulses: Normal pulses.     Heart sounds: Normal heart sounds.  Pulmonary:     Effort: Pulmonary effort is normal.     Breath sounds: Normal breath sounds.  Musculoskeletal:        General: Normal range of motion.     Right lower leg: Edema (non pitting) present.     Left lower leg: Edema (non pitting) present.  Skin:    General: Skin is warm and dry.     Comments: Venous stasis changes in bilateral lower extremities   Neurological:     General: No focal deficit present.     Mental Status: He is alert and oriented to person, place, and time.  Psychiatric:        Mood and Affect: Mood normal.        Behavior: Behavior normal.        Thought Content: Thought content normal.        Judgment: Judgment normal.       Assessment & Plan:  1. Leg cramping  - Basic Metabolic Panel; Future - Magnesium; Future - VITAMIN D 25 Hydroxy (Vit-D Deficiency, Fractures); Future  2. Leg fatigue - There is some concern for claudication  - Ambulatory referral to Vascular Surgery  3. Prediabetes  - Hemoglobin A1c; Future  Shirline Frees, NP

## 2022-11-09 ENCOUNTER — Other Ambulatory Visit: Payer: Self-pay | Admitting: *Deleted

## 2022-11-09 DIAGNOSIS — M79606 Pain in leg, unspecified: Secondary | ICD-10-CM

## 2022-11-09 NOTE — Addendum Note (Signed)
Addended by: Thomasena Edis on: 11/09/2022 07:21 AM   Modules accepted: Orders

## 2022-11-21 ENCOUNTER — Ambulatory Visit (HOSPITAL_COMMUNITY)
Admission: RE | Admit: 2022-11-21 | Discharge: 2022-11-21 | Disposition: A | Discharge status: Home / Self Care | Payer: HMO | Source: Ambulatory Visit | Attending: Vascular Surgery | Admitting: Vascular Surgery

## 2022-11-21 DIAGNOSIS — M79606 Pain in leg, unspecified: Secondary | ICD-10-CM | POA: Insufficient documentation

## 2022-11-21 LAB — VAS US ABI WITH/WO TBI
Left ABI: 1.19
Right ABI: 1.33

## 2022-11-25 NOTE — Progress Notes (Unsigned)
[facility] [place]  ASSESSMENT / PLAN: 67 y.o. male with reassuring clinical vascular exam and noninvasive testing.  He has chronic venous insufficiency of bilateral lower extremities with inflammatory skin changes (C4 disease).  I do not think he has significant peripheral arterial disease.  His symptoms are worrisome to me for neurogenic claudication.  I encouraged him to follow-up with his primary care physician.  He can follow-up with me on an as-needed basis.  CHIEF COMPLAINT: bilateral leg pain  HISTORY OF PRESENT ILLNESS: James [name] is a 67 y.o. male who presents to clinic for evaluation of bilateral lower extremity pain.  The pain is throughout the bilateral legs.  The pain is described as cramping.  The pain occurs at rest and with activity.  He notices it when seated for long period of time or when standing for long period of time, or when walking.  He also notices tingling discomfort in his upper extremities especially after sleeping.  He does not have clear-cut effort related claudication symptoms.  He has no rest pain.  He has no ulcers about his feet.   Past Medical History:  Diagnosis Date   Arthritis    Bilateral swelling of feet    Chicken pox    Complication of anesthesia    difficulty waking up   Depression    Hypertension    Joint pain    Obesity    Osteoarthritis    Prediabetes    Shortness of breath    Swelling    feet and legs   Vitamin D deficiency     Past Surgical History:  Procedure Laterality Date   cartilage removal     COLONOSCOPY WITH PROPOFOL N/A 12/14/2019   Procedure: COLONOSCOPY WITH PROPOFOL;  Surgeon: Sherrilyn Rist, MD;  Location: [facility];  Service: Gastroenterology;  Laterality: N/A;   KNEE SURGERY  2000   POLYPECTOMY  12/14/2019   Procedure: POLYPECTOMY;  Surgeon: Sherrilyn Rist, MD;  Location: [facility];  Service: Gastroenterology;;   TOTAL HIP ARTHROPLASTY Left 02/11/2019   Procedure: TOTAL HIP  ARTHROPLASTY-POSTERIOR;  Surgeon: Ollen Gross, MD;  Location: [facility];  Service: Orthopedics;  Laterality: Left;     Family History  Problem Relation Age of Onset   Cancer Mother        lung and soft tissue cancer from radiation   Stroke Mother    Obesity Mother    Heart disease Father        CHF   Diabetes Father    Hypertension Father    Obesity Father    Heart disease Maternal Grandfather    Diabetes Paternal Grandmother    Ovarian cancer Sister    Colon cancer Neg Hx    Colon polyps Neg Hx    Esophageal cancer Neg Hx    Rectal cancer Neg Hx    Stomach cancer Neg Hx     Social History   Socioeconomic History   Marital status: Married    Spouse name: Not on file   Number of children: Not on file   Years of education: Not on file   Highest education level: Associate degree: occupational, Scientist, product/process development, or vocational program  Occupational History   Occupation: At home caregiver  Tobacco Use   Smoking status: Former    Current packs/day: 0.00    Types: Cigarettes    Quit date: 07/16/1992    Years since quitting: 30.3   Smokeless tobacco: Never  Substance and Sexual  Activity   Alcohol use: Yes    Comment: very little per patient    Drug use: No   Sexual activity: Not on file  Other Topics Concern   Not on file  Social History Narrative   Work or School: self employed, 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      Home Situation: living with and caring for wife whom had stroke and brain tumor      Spiritual Beliefs: Methodist      Lifestyle: no regular exercise, diet poor            Social Determinants of Health   Financial Resource Strain: Low Risk  (10/30/2022)   Overall Financial Resource Strain (CARDIA)    Difficulty of Paying Living Expenses: Not very hard  Food Insecurity: No Food Insecurity (10/30/2022)   Hunger Vital Sign    Worried About Running Out of Food in the Last Year: Never true    Ran Out of Food in the Last Year: Never true  Transportation  Needs: No Transportation Needs (10/30/2022)   PRAPARE - Administrator, Civil Service (Medical): No    Lack of Transportation (Non-Medical): No  Physical Activity: Inactive (10/30/2022)   Exercise Vital Sign    Days of Exercise per Week: 0 days    Minutes of Exercise per Session: 40 min  Stress: Stress Concern Present (10/30/2022)   [employer] of Occupational Health - Occupational Stress Questionnaire    Feeling of Stress : To some extent  Social Connections: Moderately Isolated (10/30/2022)   Social Connection and Isolation Panel [NHANES]    Frequency of Communication with Friends and Family: Once a week    Frequency of Social Gatherings with Friends and Family: Never    Attends Religious Services: Never    Database administrator or Organizations: Yes    Attends Engineer, structural: More than 4 times per year    Marital Status: Married  Catering manager Violence: Not At Risk (06/01/2022)   Humiliation, Afraid, Rape, and Kick questionnaire    Fear of Current or Ex-Partner: No    Emotionally Abused: No    Physically Abused: No    Sexually Abused: No    No Known Allergies  Current Outpatient Medications  Medication Sig Dispense Refill   acetaminophen (TYLENOL) 500 MG tablet Take 1,000 mg by mouth at bedtime as needed for moderate pain. (Patient not taking: Reported on 10/31/2022)     amoxicillin (AMOXIL) 500 MG capsule Take 2,000 mg by mouth See admin instructions. Take 2000 mg 1 hour prior to dental work (Patient not taking: Reported on 10/31/2022)     cetirizine (ZYRTEC) 10 MG tablet Take 10 mg by mouth daily.     cholecalciferol (VITAMIN D3) 25 MCG (1000 UNIT) tablet Take 2,000 Units by mouth daily.     fluticasone (FLONASE) 50 MCG/ACT nasal spray Place 1 spray into both nostrils daily.      hydrochlorothiazide (HYDRODIURIL) 25 MG tablet TAKE 1 TABLET (25 MG TOTAL) BY MOUTH DAILY. 90 tablet 1   ibuprofen (ADVIL) 200 MG tablet Take 400 mg by mouth at bedtime  as needed for moderate pain.     Ketotifen Fumarate (ALLERGY EYE DROPS OP) Place 1 drop into both eyes daily as needed (allergies).     lidocaine (LMX) 4 % cream Apply 1 application topically daily as needed (pain).     lisinopril (ZESTRIL) 10 MG tablet TAKE 2 TABLETS BY MOUTH EVERY DAY 180 tablet 1   Multiple  Vitamin (MULTIVITAMIN) tablet Take 1 tablet by mouth daily.     No current facility-administered medications for this visit.    PHYSICAL EXAM Vitals:   11/26/22 0834  BP: 102/67  Pulse: 78  Resp: 20  Temp: 98.2 F (36.8 C)  SpO2: 93%  Weight: (!) 354 lb (160.6 kg)  Height: 5\' 5"  (1.651 m)    Morbidly obese man in no distress Regular rate and rhythm Unlabored breathing 1+ dorsalis pedis pulse bilaterally  PERTINENT LABORATORY AND RADIOLOGIC DATA  Most recent CBC    Latest Ref Rng & Units 02/23/2022    2:38 PM 02/23/2021    8:30 AM 01/19/2020    1:46 PM  CBC  WBC 4.0 - 10.5 K/uL 7.8  6.0  7.1   Hemoglobin 13.0 - 17.0 g/dL 13.0  86.5  78.4   Hematocrit 39.0 - 52.0 % 42.7  42.0  44.1   Platelets 150.0 - 400.0 K/uL 207.0  163.0  208      Most recent CMP    Latest Ref Rng & Units 10/31/2022    2:31 PM 02/23/2022    2:38 PM 09/08/2021    8:53 AM  CMP  Glucose 70 - 99 mg/dL 94  93  97   BUN 6 - 23 mg/dL 14  14  17    Creatinine 0.40 - 1.50 mg/dL 6.96  2.95  2.84   Sodium 135 - 145 mEq/L 135  136  141   Potassium 3.5 - 5.1 mEq/L 3.7  3.8  4.1   Chloride 96 - 112 mEq/L 96  98  103   CO2 19 - 32 mEq/L 30  28  30    Calcium 8.4 - 10.5 mg/dL 9.7  9.3  9.0   Total Protein 6.0 - 8.3 g/dL  7.5    Total Bilirubin 0.2 - 1.2 mg/dL  0.4    Alkaline Phos 39 - 117 U/L  49    AST 0 - 37 U/L  22    ALT 0 - 53 U/L  25      Renal function CrCl cannot be calculated (Patient's most recent lab result is older than the maximum 21 days allowed.).  Hgb A1c MFr Bld (%)  Date Value  10/31/2022 6.0    LDL Cholesterol (Calc)  Date Value Ref Range Status  01/19/2020 105 (H) mg/dL  (calc) Final    Comment:    Reference range: <100 . Desirable range <100 mg/dL for primary prevention;   <70 mg/dL for patients with CHD or diabetic patients  with > or = 2 CHD risk factors. Marland Kitchen LDL-C is now calculated using the Martin-Hopkins  calculation, which is a validated novel method providing  better accuracy than the Friedewald equation in the  estimation of LDL-C.  Horald Pollen et al. Lenox Ahr. 1324;401(02): 2061-2068  (http://education.[facility].com/faq/FAQ164)    LDL Chol Calc (NIH)  Date Value Ref Range Status  09/22/2020 113 (H) 0 - 99 mg/dL Final   LDL Cholesterol  Date Value Ref Range Status  02/23/2022 93 0 - 99 mg/dL Final     +-------+-----------+-----------+------------+------------+  ABI/TBIToday's ABIToday's TBIPrevious ABIPrevious TBI  +-------+-----------+-----------+------------+------------+  Right 1.33       1.03                                 +-------+-----------+-----------+------------+------------+  Left  1.19       1.07                                 +-------+-----------+-----------+------------+------------+  Rande Brunt. Lenell Antu, MD FACS [facility] Phone Number: [phone] 11/26/2022 10:56 AM   Total time spent on preparing this encounter including chart review, data review, collecting history, examining the patient, coordinating care for this new patient, 45 minutes.  Portions of this report may have been transcribed using voice recognition software.  Every effort has been made to ensure accuracy; however, inadvertent computerized transcription errors may still be present.

## 2022-11-26 ENCOUNTER — Ambulatory Visit (INDEPENDENT_AMBULATORY_CARE_PROVIDER_SITE_OTHER): Payer: HMO | Admitting: Vascular Surgery

## 2022-11-26 ENCOUNTER — Encounter: Payer: Self-pay | Admitting: Adult Health

## 2022-11-26 ENCOUNTER — Encounter: Payer: Self-pay | Admitting: Vascular Surgery

## 2022-11-26 VITALS — BP 102/67 | HR 78 | Temp 98.2°F | Resp 20 | Ht 65.0 in | Wt 354.0 lb

## 2022-11-26 DIAGNOSIS — M79604 Pain in right leg: Secondary | ICD-10-CM | POA: Diagnosis not present

## 2022-11-26 DIAGNOSIS — M79605 Pain in left leg: Secondary | ICD-10-CM

## 2022-11-27 NOTE — Telephone Encounter (Signed)
Please advise 

## 2022-12-06 ENCOUNTER — Ambulatory Visit (INDEPENDENT_AMBULATORY_CARE_PROVIDER_SITE_OTHER): Payer: HMO | Admitting: Adult Health

## 2022-12-06 ENCOUNTER — Ambulatory Visit: Payer: HMO

## 2022-12-06 ENCOUNTER — Encounter: Payer: Self-pay | Admitting: Adult Health

## 2022-12-06 VITALS — BP 120/70 | HR 66 | Temp 97.7°F | Ht 65.0 in | Wt 353.0 lb

## 2022-12-06 DIAGNOSIS — M5416 Radiculopathy, lumbar region: Secondary | ICD-10-CM

## 2022-12-06 DIAGNOSIS — M4726 Other spondylosis with radiculopathy, lumbar region: Secondary | ICD-10-CM | POA: Diagnosis not present

## 2022-12-06 DIAGNOSIS — M5412 Radiculopathy, cervical region: Secondary | ICD-10-CM | POA: Diagnosis not present

## 2022-12-06 DIAGNOSIS — M5010 Cervical disc disorder with radiculopathy, unspecified cervical region: Secondary | ICD-10-CM | POA: Diagnosis not present

## 2022-12-06 DIAGNOSIS — M4722 Other spondylosis with radiculopathy, cervical region: Secondary | ICD-10-CM | POA: Diagnosis not present

## 2022-12-06 DIAGNOSIS — M5116 Intervertebral disc disorders with radiculopathy, lumbar region: Secondary | ICD-10-CM | POA: Diagnosis not present

## 2022-12-06 NOTE — Patient Instructions (Signed)
It was great seeing you today   I am going to do some xrays on your back and I have ordered MRI's.   Let me know if you need anything

## 2022-12-06 NOTE — Progress Notes (Signed)
Subjective:    Patient ID: James [name], male    DOB: 1955/10/24, 67 y.o.   MRN: [id number]  HPI 67 year old male who  has a past medical history of Arthritis, Bilateral swelling of feet, Chicken pox, Complication of anesthesia, Depression, Hypertension, Joint pain, Obesity, Osteoarthritis, Prediabetes, Shortness of breath, Swelling, and Vitamin D deficiency.  He presents to the office today for follow up after being seen at Vascular Surgery for concern for claudication.   Per Vascular surgery  " He has chronic venous insufficiency of bilateral lower extremities with inflammatory skin changes (C4 disease).  I do not think he has significant peripheral arterial disease.  His symptoms are worrisome to me for neurogenic claudication.  I encouraged him to follow-up with his primary care physician.  He can follow-up with me on an as-needed basis".   He continues to have pain daily in his lower extremities. Denies issues with bowel or bladder.   He also reports that for the last few years he has been experiencing numbness and tingling in both upper extremities. This happens mostly when he lays down in bed. Feels as though this affects grip strength.    Review of Systems See HPI   Past Medical History:  Diagnosis Date   Arthritis    Bilateral swelling of feet    Chicken pox    Complication of anesthesia    difficulty waking up   Depression    Hypertension    Joint pain    Obesity    Osteoarthritis    Prediabetes    Shortness of breath    Swelling    feet and legs   Vitamin D deficiency     Social History   Socioeconomic History   Marital status: Married    Spouse name: Not on file   Number of children: Not on file   Years of education: Not on file   Highest education level: Associate degree: occupational, Scientist, product/process development, or vocational program  Occupational History   Occupation: At home caregiver  Tobacco Use   Smoking status: Former    Current packs/day: 0.00    Types:  Cigarettes    Quit date: 07/16/1992    Years since quitting: 30.4   Smokeless tobacco: Never  Substance and Sexual Activity   Alcohol use: Yes    Comment: very little per patient    Drug use: No   Sexual activity: Not on file  Other Topics Concern   Not on file  Social History Narrative   Work or School: self employed, 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      Home Situation: living with and caring for wife whom had stroke and brain tumor      Spiritual Beliefs: Methodist      Lifestyle: no regular exercise, diet poor            Social Determinants of Health   Financial Resource Strain: Low Risk  (10/30/2022)   Overall Financial Resource Strain (CARDIA)    Difficulty of Paying Living Expenses: Not very hard  Food Insecurity: No Food Insecurity (10/30/2022)   Hunger Vital Sign    Worried About Running Out of Food in the Last Year: Never true    Ran Out of Food in the Last Year: Never true  Transportation Needs: No Transportation Needs (10/30/2022)   PRAPARE - Administrator, Civil Service (Medical): No    Lack of Transportation (Non-Medical): No  Physical Activity: Inactive (10/30/2022)   Exercise Vital  Sign    Days of Exercise per Week: 0 days    Minutes of Exercise per Session: 40 min  Stress: Stress Concern Present (10/30/2022)   [employer] of Occupational Health - Occupational Stress Questionnaire    Feeling of Stress : To some extent  Social Connections: Moderately Isolated (10/30/2022)   Social Connection and Isolation Panel [NHANES]    Frequency of Communication with Friends and Family: Once a week    Frequency of Social Gatherings with Friends and Family: Never    Attends Religious Services: Never    Database administrator or Organizations: Yes    Attends Engineer, structural: More than 4 times per year    Marital Status: Married  Catering manager Violence: Not At Risk (06/01/2022)   Humiliation, Afraid, Rape, and Kick questionnaire    Fear of  Current or Ex-Partner: No    Emotionally Abused: No    Physically Abused: No    Sexually Abused: No    Past Surgical History:  Procedure Laterality Date   cartilage removal     COLONOSCOPY WITH PROPOFOL N/A 12/14/2019   Procedure: COLONOSCOPY WITH PROPOFOL;  Surgeon: Sherrilyn Rist, MD;  Location: [facility];  Service: Gastroenterology;  Laterality: N/A;   KNEE SURGERY  2000   POLYPECTOMY  12/14/2019   Procedure: POLYPECTOMY;  Surgeon: Sherrilyn Rist, MD;  Location: [facility];  Service: Gastroenterology;;   TOTAL HIP ARTHROPLASTY Left 02/11/2019   Procedure: TOTAL HIP ARTHROPLASTY-POSTERIOR;  Surgeon: Ollen Gross, MD;  Location: [facility];  Service: Orthopedics;  Laterality: Left;     Family History  Problem Relation Age of Onset   Cancer Mother        lung and soft tissue cancer from radiation   Stroke Mother    Obesity Mother    Heart disease Father        CHF   Diabetes Father    Hypertension Father    Obesity Father    Heart disease Maternal Grandfather    Diabetes Paternal Grandmother    Ovarian cancer Sister    Colon cancer Neg Hx    Colon polyps Neg Hx    Esophageal cancer Neg Hx    Rectal cancer Neg Hx    Stomach cancer Neg Hx     No Known Allergies  Current Outpatient Medications on File Prior to Visit  Medication Sig Dispense Refill   acetaminophen (TYLENOL) 500 MG tablet Take 1,000 mg by mouth at bedtime as needed for moderate pain.     amoxicillin (AMOXIL) 500 MG capsule Take 2,000 mg by mouth See admin instructions. Take 2000 mg 1 hour prior to dental work     cetirizine (ZYRTEC) 10 MG tablet Take 10 mg by mouth daily.     cholecalciferol (VITAMIN D3) 25 MCG (1000 UNIT) tablet Take 2,000 Units by mouth daily.     fluticasone (FLONASE) 50 MCG/ACT nasal spray Place 1 spray into both nostrils daily.      hydrochlorothiazide (HYDRODIURIL) 25 MG tablet TAKE 1 TABLET (25 MG TOTAL) BY MOUTH DAILY. 90 tablet 1   ibuprofen (ADVIL) 200 MG tablet Take  400 mg by mouth at bedtime as needed for moderate pain.     Ketotifen Fumarate (ALLERGY EYE DROPS OP) Place 1 drop into both eyes daily as needed (allergies).     lidocaine (LMX) 4 % cream Apply 1 application topically daily as needed (pain).     lisinopril (ZESTRIL) 10 MG tablet TAKE 2 TABLETS  BY MOUTH EVERY DAY 180 tablet 1   Magnesium 250 MG TABS Take by mouth.     Multiple Vitamin (MULTIVITAMIN) tablet Take 1 tablet by mouth daily.     No current facility-administered medications on file prior to visit.    BP 120/70   Pulse 66   Temp 97.7 F (36.5 C) (Oral)   Ht 5\' 5"  (1.651 m)   Wt (!) 353 lb (160.1 kg)   SpO2 96%   BMI 58.74 kg/m       Objective:   Physical Exam Vitals and nursing note reviewed.  Constitutional:      Appearance: Normal appearance. He is obese.  Cardiovascular:     Rate and Rhythm: Normal rate and regular rhythm.     Pulses: Normal pulses.     Heart sounds: Normal heart sounds.  Pulmonary:     Effort: Pulmonary effort is normal.     Breath sounds: Normal breath sounds.  Musculoskeletal:        General: Tenderness present. Normal range of motion.  Skin:    General: Skin is warm and dry.  Neurological:     General: No focal deficit present.     Mental Status: He is alert and oriented to person, place, and time.     Cranial Nerves: Cranial nerves 2-12 are intact.     Sensory: Sensation is intact.     Motor: Motor function is intact.     Coordination: Coordination is intact.     Gait: Gait abnormal.  Psychiatric:        Mood and Affect: Mood normal.        Behavior: Behavior normal.        Thought Content: Thought content normal.        Judgment: Judgment normal.        Assessment & Plan:   1. Lumbar radiculopathy, chronic - Will get xray today and order MRI  - DG Lumbar Spine Complete; Future - MR Lumbar Spine Wo Contrast; Future  2. Cervical radiculopathy - Will get xray today and order MRI  - DG Cervical Spine Complete; Future - MR  Cervical Spine Wo Contrast; Future   Shirline Frees, NP  Time spent with patient today was 31 minutes which consisted of chart review, discussing cervical and lumbar radiculopathy work up, treatment answering questions and documentation.

## 2022-12-12 ENCOUNTER — Ambulatory Visit
Admission: RE | Admit: 2022-12-12 | Discharge: 2022-12-12 | Disposition: A | Discharge status: Home / Self Care | Payer: HMO | Source: Ambulatory Visit | Attending: Adult Health | Admitting: Adult Health

## 2022-12-12 DIAGNOSIS — M5416 Radiculopathy, lumbar region: Secondary | ICD-10-CM | POA: Diagnosis not present

## 2022-12-12 DIAGNOSIS — M5412 Radiculopathy, cervical region: Secondary | ICD-10-CM | POA: Diagnosis not present

## 2022-12-12 DIAGNOSIS — M48062 Spinal stenosis, lumbar region with neurogenic claudication: Secondary | ICD-10-CM | POA: Diagnosis not present

## 2022-12-12 DIAGNOSIS — M4722 Other spondylosis with radiculopathy, cervical region: Secondary | ICD-10-CM | POA: Diagnosis not present

## 2022-12-12 DIAGNOSIS — R200 Anesthesia of skin: Secondary | ICD-10-CM | POA: Diagnosis not present

## 2022-12-21 ENCOUNTER — Encounter: Payer: Self-pay | Admitting: Adult Health

## 2022-12-26 ENCOUNTER — Other Ambulatory Visit: Payer: Self-pay

## 2022-12-26 DIAGNOSIS — M48061 Spinal stenosis, lumbar region without neurogenic claudication: Secondary | ICD-10-CM

## 2022-12-31 ENCOUNTER — Telehealth: Payer: HMO | Admitting: Family Medicine

## 2022-12-31 ENCOUNTER — Encounter: Payer: Self-pay | Admitting: Adult Health

## 2022-12-31 DIAGNOSIS — U071 COVID-19: Secondary | ICD-10-CM | POA: Diagnosis not present

## 2023-01-10 DIAGNOSIS — Z6841 Body Mass Index (BMI) 40.0 and over, adult: Secondary | ICD-10-CM | POA: Diagnosis not present

## 2023-01-10 DIAGNOSIS — G5603 Carpal tunnel syndrome, bilateral upper limbs: Secondary | ICD-10-CM | POA: Diagnosis not present

## 2023-01-10 DIAGNOSIS — M47816 Spondylosis without myelopathy or radiculopathy, lumbar region: Secondary | ICD-10-CM | POA: Diagnosis not present

## 2023-01-10 DIAGNOSIS — M4802 Spinal stenosis, cervical region: Secondary | ICD-10-CM | POA: Diagnosis not present

## 2023-01-22 DIAGNOSIS — M47816 Spondylosis without myelopathy or radiculopathy, lumbar region: Secondary | ICD-10-CM | POA: Diagnosis not present

## 2023-02-11 DIAGNOSIS — G5603 Carpal tunnel syndrome, bilateral upper limbs: Secondary | ICD-10-CM | POA: Diagnosis not present

## 2023-02-26 DIAGNOSIS — H2513 Age-related nuclear cataract, bilateral: Secondary | ICD-10-CM | POA: Diagnosis not present

## 2023-02-28 DIAGNOSIS — Z6841 Body Mass Index (BMI) 40.0 and over, adult: Secondary | ICD-10-CM | POA: Diagnosis not present

## 2023-02-28 DIAGNOSIS — M4802 Spinal stenosis, cervical region: Secondary | ICD-10-CM | POA: Diagnosis not present

## 2023-03-04 ENCOUNTER — Encounter: Payer: Self-pay | Admitting: Adult Health

## 2023-03-06 ENCOUNTER — Other Ambulatory Visit: Payer: Self-pay | Admitting: Adult Health

## 2023-03-06 ENCOUNTER — Ambulatory Visit (INDEPENDENT_AMBULATORY_CARE_PROVIDER_SITE_OTHER): Payer: HMO | Admitting: Adult Health

## 2023-03-06 ENCOUNTER — Encounter: Payer: Self-pay | Admitting: Adult Health

## 2023-03-06 VITALS — BP 120/70 | HR 84 | Temp 98.4°F | Ht 65.0 in | Wt 351.0 lb

## 2023-03-06 DIAGNOSIS — R600 Localized edema: Secondary | ICD-10-CM

## 2023-03-06 MED ORDER — POTASSIUM CHLORIDE CRYS ER 20 MEQ PO TBCR: 20.0000 meq | EXTENDED_RELEASE_TABLET | Freq: Every day | ORAL | 0 refills | Status: AC

## 2023-03-06 MED ORDER — FUROSEMIDE 40 MG PO TABS: ORAL_TABLET | ORAL | 0 refills | Status: DC

## 2023-03-06 NOTE — Progress Notes (Signed)
Subjective:    Patient ID: James [name], male    DOB: 06-13-1955, 67 y.o.   MRN: [id number]  HPI 67 year old male who  has a past medical history of Arthritis, Bilateral swelling of feet, Chicken pox, Complication of anesthesia, Depression, Hypertension, Joint pain, Obesity, Osteoarthritis, Prediabetes, Shortness of breath, Swelling, and Vitamin D deficiency.  He presents to the office today for worsening lower extremity edema.  Reports that over the weekend his aches became very tight and he developed a blister on his right shin.  He doubled up on bribed hydrochlorothiazide and this helped minimally, his legs do not feel as tight but they are still edematous.   Review of Systems See HPI   Past Medical History:  Diagnosis Date   Arthritis    Bilateral swelling of feet    Chicken pox    Complication of anesthesia    difficulty waking up   Depression    Hypertension    Joint pain    Obesity    Osteoarthritis    Prediabetes    Shortness of breath    Swelling    feet and legs   Vitamin D deficiency     Social History   Socioeconomic History   Marital status: Married    Spouse name: Not on file   Number of children: Not on file   Years of education: Not on file   Highest education level: Bachelor's degree (e.g., BA, AB, BS)  Occupational History   Occupation: At home caregiver  Tobacco Use   Smoking status: Former    Current packs/day: 0.00    Types: Cigarettes    Quit date: 07/16/1992    Years since quitting: 30.6   Smokeless tobacco: Never  Substance and Sexual Activity   Alcohol use: Yes    Comment: very little per patient    Drug use: No   Sexual activity: Not on file  Other Topics Concern   Not on file  Social History Narrative   Work or School: self employed, 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      Home Situation: living with and caring for wife whom had stroke and brain tumor      Spiritual Beliefs: Methodist      Lifestyle: no regular exercise, diet  poor            Social Determinants of Health   Financial Resource Strain: Low Risk  (03/05/2023)   Overall Financial Resource Strain (CARDIA)    Difficulty of Paying Living Expenses: Not very hard  Food Insecurity: Unknown (03/05/2023)   Hunger Vital Sign    Worried About Running Out of Food in the Last Year: Never true    Ran Out of Food in the Last Year: Patient declined  Transportation Needs: No Transportation Needs (03/05/2023)   PRAPARE - Administrator, Civil Service (Medical): No    Lack of Transportation (Non-Medical): No  Physical Activity: Inactive (03/05/2023)   Exercise Vital Sign    Days of Exercise per Week: 0 days    Minutes of Exercise per Session: 40 min  Stress: No Stress Concern Present (03/05/2023)   [employer] of Occupational Health - Occupational Stress Questionnaire    Feeling of Stress : Only a little  Social Connections: Unknown (03/05/2023)   Social Connection and Isolation Panel [NHANES]    Frequency of Communication with Friends and Family: Patient declined    Frequency of Social Gatherings with Friends and Family: Patient declined  Attends Religious Services: Patient declined    Active Member of Clubs or Organizations: Patient declined    Attends Banker Meetings: More than 4 times per year    Marital Status: Married  Catering manager Violence: Not At Risk (06/01/2022)   Humiliation, Afraid, Rape, and Kick questionnaire    Fear of Current or Ex-Partner: No    Emotionally Abused: No    Physically Abused: No    Sexually Abused: No    Past Surgical History:  Procedure Laterality Date   cartilage removal     COLONOSCOPY WITH PROPOFOL N/A 12/14/2019   Procedure: COLONOSCOPY WITH PROPOFOL;  Surgeon: Sherrilyn Rist, MD;  Location: [facility];  Service: Gastroenterology;  Laterality: N/A;   KNEE SURGERY  2000   POLYPECTOMY  12/14/2019   Procedure: POLYPECTOMY;  Surgeon: Sherrilyn Rist, MD;  Location: [facility];  Service: Gastroenterology;;   TOTAL HIP ARTHROPLASTY Left 02/11/2019   Procedure: TOTAL HIP ARTHROPLASTY-POSTERIOR;  Surgeon: Ollen Gross, MD;  Location: [facility];  Service: Orthopedics;  Laterality: Left;     Family History  Problem Relation Age of Onset   Cancer Mother        lung and soft tissue cancer from radiation   Stroke Mother    Obesity Mother    Heart disease Father        CHF   Diabetes Father    Hypertension Father    Obesity Father    Heart disease Maternal Grandfather    Diabetes Paternal Grandmother    Ovarian cancer Sister    Colon cancer Neg Hx    Colon polyps Neg Hx    Esophageal cancer Neg Hx    Rectal cancer Neg Hx    Stomach cancer Neg Hx     No Known Allergies  Current Outpatient Medications on File Prior to Visit  Medication Sig Dispense Refill   acetaminophen (TYLENOL) 500 MG tablet Take 1,000 mg by mouth at bedtime as needed for moderate pain.     amoxicillin (AMOXIL) 500 MG capsule Take 2,000 mg by mouth See admin instructions. Take 2000 mg 1 hour prior to dental work     cetirizine (ZYRTEC) 10 MG tablet Take 10 mg by mouth daily.     cholecalciferol (VITAMIN D3) 25 MCG (1000 UNIT) tablet Take 2,000 Units by mouth daily.     fluticasone (FLONASE) 50 MCG/ACT nasal spray Place 1 spray into both nostrils daily.      hydrochlorothiazide (HYDRODIURIL) 25 MG tablet TAKE 1 TABLET (25 MG TOTAL) BY MOUTH DAILY. 90 tablet 1   ibuprofen (ADVIL) 200 MG tablet Take 400 mg by mouth at bedtime as needed for moderate pain.     Ketotifen Fumarate (ALLERGY EYE DROPS OP) Place 1 drop into both eyes daily as needed (allergies).     lidocaine (LMX) 4 % cream Apply 1 application topically daily as needed (pain).     lisinopril (ZESTRIL) 10 MG tablet TAKE 2 TABLETS BY MOUTH EVERY DAY 180 tablet 1   Magnesium 250 MG TABS Take by mouth.     Multiple Vitamin (MULTIVITAMIN) tablet Take 1 tablet by mouth daily.     No current facility-administered  medications on file prior to visit.    BP 120/70   Pulse 84   Temp 98.4 F (36.9 C) (Oral)   Ht 5\' 5"  (1.651 m)   Wt (!) 351 lb (159.2 kg)   SpO2 96%   BMI 58.41 kg/m  Objective:   Physical Exam Vitals and nursing note reviewed.  Constitutional:      Appearance: Normal appearance.  Cardiovascular:     Rate and Rhythm: Normal rate and regular rhythm.     Pulses: Normal pulses.     Heart sounds: Normal heart sounds.     Comments: Blister noted on lower right shin  Pulmonary:     Effort: Pulmonary effort is normal.     Breath sounds: Normal breath sounds.  Musculoskeletal:     Right lower leg: 2+ Pitting Edema present.     Left lower leg: 2+ Pitting Edema present.  Skin:    General: Skin is warm and dry.  Neurological:     General: No focal deficit present.     Mental Status: He is alert and oriented to person, place, and time.  Psychiatric:        Mood and Affect: Mood normal.        Behavior: Behavior normal.        Thought Content: Thought content normal.        Judgment: Judgment normal.       Assessment & Plan:  1. Lower extremity edema -No signs of DVT. -He is on Lasix 40 mg daily, in the past we have used Lasix 20 mg daily and this did not work well.  Will add potassium chloride 20 mill equivalents to take when he takes his potassium supplementation.  Will have him take for 3 days in a row and reevaluate edema at that point. - furosemide (LASIX) 40 MG tablet; Take for three days in a row.  Dispense: 30 tablet; Refill: 0 - potassium chloride SA (KLOR-CON M) 20 MEQ tablet; Take 1 tablet (20 mEq total) by mouth daily. Take with lasix  Dispense: 30 tablet; Refill: 0

## 2023-03-07 ENCOUNTER — Other Ambulatory Visit: Payer: Self-pay

## 2023-03-07 ENCOUNTER — Encounter: Payer: Self-pay | Admitting: Rehabilitative and Restorative Service Providers"

## 2023-03-07 ENCOUNTER — Ambulatory Visit: Payer: HMO | Attending: Neurosurgery | Admitting: Rehabilitative and Restorative Service Providers"

## 2023-03-07 DIAGNOSIS — M542 Cervicalgia: Secondary | ICD-10-CM

## 2023-03-07 DIAGNOSIS — M6281 Muscle weakness (generalized): Secondary | ICD-10-CM

## 2023-03-07 DIAGNOSIS — R252 Cramp and spasm: Secondary | ICD-10-CM

## 2023-03-07 DIAGNOSIS — R2689 Other abnormalities of gait and mobility: Secondary | ICD-10-CM

## 2023-03-07 NOTE — Telephone Encounter (Signed)
Please provide clarification on the signature.

## 2023-03-07 NOTE — Therapy (Signed)
OUTPATIENT PHYSICAL THERAPY CERVICAL EVALUATION   Patient Name: James [name] MRN: [id number] DOB:[birth year], 67 y.o., male Today's Date: 03/07/2023  END OF SESSION:  PT End of Session - 03/07/23 1149     Visit Number 1    Date for PT Re-Evaluation 05/03/23    Authorization Type [employer]    Progress Note Due on Visit 10    PT Start Time 1145    PT Stop Time 1225    PT Time Calculation (min) 40 min    Activity Tolerance Patient tolerated treatment well    Behavior During Therapy WFL for tasks assessed/performed             Past Medical History:  Diagnosis Date   Arthritis    Bilateral swelling of feet    Chicken pox    Complication of anesthesia    difficulty waking up   Depression    Hypertension    Joint pain    Obesity    Osteoarthritis    Prediabetes    Shortness of breath    Swelling    feet and legs   Vitamin D deficiency    Past Surgical History:  Procedure Laterality Date   cartilage removal     COLONOSCOPY WITH PROPOFOL N/A 12/14/2019   Procedure: COLONOSCOPY WITH PROPOFOL;  Surgeon: Sherrilyn Rist, MD;  Location: [facility];  Service: Gastroenterology;  Laterality: N/A;   KNEE SURGERY  2000   POLYPECTOMY  12/14/2019   Procedure: POLYPECTOMY;  Surgeon: Sherrilyn Rist, MD;  Location: [facility];  Service: Gastroenterology;;   TOTAL HIP ARTHROPLASTY Left 02/11/2019   Procedure: TOTAL HIP ARTHROPLASTY-POSTERIOR;  Surgeon: Ollen Gross, MD;  Location: [facility];  Service: Orthopedics;  Laterality: Left;    Patient Active Problem List   Diagnosis Date Noted   Depression 09/08/2020   Special screening for malignant neoplasms, colon    Benign neoplasm of rectum    OA (osteoarthritis) of hip 02/11/2019   Other hyperlipidemia 04/10/2017   Vitamin D deficiency 04/10/2017   Prediabetes 12/17/2016   Class 3 severe obesity with serious comorbidity and body mass index (BMI) of 50.0 to 59.9 in adult (HCC) 12/17/2016   At risk for  diabetes mellitus 10/02/2016   Hyperglycemia 09/04/2016   Severe obesity (BMI >= 40) (HCC) 01/15/2013   OBESITY, MORBID 01/20/2007   Essential hypertension 10/31/2006    PCP: Shirline Frees, NP  REFERRING PROVIDER: Julio Sicks, MD  REFERRING DIAG: [phone] (ICD-10-CM) - Spinal stenosis, cervical region  THERAPY DIAG:  Cervicalgia  Muscle weakness (generalized)  Cramp and spasm  Other abnormalities of gait and mobility  Rationale for Evaluation and Treatment: Rehabilitation  ONSET DATE: June 2024 pain started getting worse  SUBJECTIVE:  SUBJECTIVE STATEMENT: Patient has history of chronic neck pain.  Patient states that he has gotten radiographs and MRI and is now seeing Dr Jordan Likes.  Patient states that he has been having "terrible headaches".  Pt does state that he has not been having vertigo, like he did last time he was seen at this office.    Hand dominance: Right  PERTINENT HISTORY:  pre-diabetes, HTN, cervical radiculopathy, OSA, vertigo  PAIN:  Are you having pain? Yes: NPRS scale: 8/10 Pain location: cervical, scapular, shoulders Pain description: sharp, tingling into bilateral hands Aggravating factors: certain positions (especially with lying on his side) Relieving factors: certain supportive positions  PRECAUTIONS: None  RED FLAGS: None     WEIGHT BEARING RESTRICTIONS: No  FALLS:  Has patient fallen in last 6 months? No  LIVING ENVIRONMENT: Lives with: lives with their spouse lives with wife who is primarily w/c bound and needs assistance with most daily activities Lives in: House/apartment Stairs:  2 story home, but they live on one level.  They have a platform lift to help get to driveway. Has following equipment at home: Single point cane and Grab  bars  OCCUPATION: Retired  PLOF: Independent  PATIENT GOALS: To avoid cervical surgery and some pain relief.  NEXT MD VISIT:  Dr Jordan Likes on 04/11/2023  OBJECTIVE:  Note: Objective measures were completed at Evaluation unless otherwise noted.  DIAGNOSTIC FINDINGS:  Cervical MRI on 12/12/2022: IMPRESSION: 1. Degenerative disc bulge with uncovertebral and facet hypertrophy at C5-6 with resultant mild canal, with severe right worse than left C6 foraminal stenosis. Reactive endplate changes with marrow edema at this level could contribute to neck pain. 2. Mild left C7 foraminal narrowing related to disc bulge and uncovertebral disease. 3. Additional mild spondylosis elsewhere within the cervical spine as above without significant stenosis or impingement.  Lumbar MRI on 12/12/2022: IMPRESSION: 1. Mild spinal and bilateral lateral recess stenosis at L4-5. There is also a shallow broad-based left foraminal disc protrusion potentially irritating the left nerve root. 2. Mild bilateral foraminal encroachment at L3-4. 3. Advanced facet disease at L5-S1 but no spinal or foraminal stenosis.  PATIENT SURVEYS:  Eval:  NDI 32 / 50 = 64.0 %  COGNITION: Overall cognitive status: Within functional limits for tasks assessed  SENSATION: Pt reports tingling down bilateral UE  POSTURE: rounded shoulders, forward head, and flexed trunk   PALPATION: Tenderness to palpation with muscle spasms noted   CERVICAL ROM:   Active ROM A/ROM (deg) eval  Flexion 55  Extension 25  Right lateral flexion 30 with pain  Left lateral flexion 35  Right rotation 48  Left rotation 55   (Blank rows = not tested)  UPPER EXTREMITY ROM:  Eval:  WFL  UPPER EXTREMITY MMT:  Eval:   Right shoulder strength grossly 4-/5 Left is WFL  CERVICAL SPECIAL TESTS:  Eval:  Distraction test: decrease in pain  FUNCTIONAL TESTS:  Eval: 5 times sit to stand: 15.92 sec Timed up and go (TUG): 15.12 sec  TODAY'S TREATMENT:  DATE: 03/07/2023 Reviewed HEP Manual Therapy:  Soft tissue mobilization and manual cervical traction to promote decreased pain.   PATIENT EDUCATION:  Education details: Issued HEP Person educated: Patient Education method: Explanation, Facilities manager, and Handouts Education comprehension: verbalized understanding  HOME EXERCISE PROGRAM: Access Code: [email or web address] URL: [email or web address] Date: 03/07/2023 Prepared by: Reather Laurence  Exercises - Seated Scapular Retraction  - 1 x daily - 7 x weekly - 2 sets - 10 reps - Seated Cervical Retraction  - 1 x daily - 7 x weekly - 2 sets - 10 reps - Seated Assisted Cervical Rotation with Towel  - 1 x daily - 7 x weekly - 1-2 sets - 10 reps - Mid-Lower Cervical Extension SNAG with Strap  - 1 x daily - 7 x weekly - 1-2 sets - 10 reps  ASSESSMENT:  CLINICAL IMPRESSION: Patient is a 67 y.o. male who was seen today for physical therapy evaluation and treatment for cervical spinal stenosis. Patient is known to this PT from a previous episode secondary to vertigo.  Patient has had a worsening of cervical pain since that time and was referred to PT to address.  Patient presents with decreased cervical A/ROM, right shoulder weakness, abnormal posture, muscle spasms, and increased cervical pain with tingling into his bilat UE.  Patient reported decreased pain following manual therapy and denied cervical pain by end of evaluation.  Patient would benefit from skilled PT to address his functional impairments and allow him to decrease pain.  OBJECTIVE IMPAIRMENTS: decreased mobility, decreased ROM, dizziness, impaired perceived functional ability, increased muscle spasms, impaired UE functional use, postural dysfunction, and pain.   ACTIVITY LIMITATIONS: carrying, lifting, sleeping, and transfers  PARTICIPATION LIMITATIONS:  cleaning, laundry, driving, and community activity  PERSONAL FACTORS: Past/current experiences, Time since onset of injury/illness/exacerbation, and 3+ comorbidities: pre-diabetes, carpal tunnel, OA  are also affecting patient's functional outcome.   REHAB POTENTIAL: Good  CLINICAL DECISION MAKING: Evolving/moderate complexity  EVALUATION COMPLEXITY: Moderate   GOALS: Goals reviewed with patient? Yes  SHORT TERM GOALS: Target date: 03/22/2023  Pt will be independent with initial HEP. Baseline:  Goal status: INITIAL  2.  Patient will report at least a 25% improvement in symptoms since starting PT. Baseline:  Goal status: INITIAL   LONG TERM GOALS: Target date: 05/03/2023  Patient will be independent with advanced HEP. Baseline:  Goal status: INITIAL  2.  Patient will improve Neck Disability Index to no greater than 50% to demonstrate improvements in functional tasks. Baseline: 64% Goal status: INITIAL  3.  Patient will increase cervical A/ROM to [facility] without increased pain or dizziness to allow for improved ease with driving. Baseline:  Goal status: INITIAL  4.  Patient will increase right shoulder strength to at least 4/5 to allow him to be able to perform functional tasks with increased ease. Baseline:  Goal status: INITIAL  5.  Patient will report at least a 50% improvement in pain symptoms since starting PT. Baseline:  Goal status: INITIAL    PLAN:  PT FREQUENCY: 2x/week  PT DURATION: 8 weeks  PLANNED INTERVENTIONS: 97164- PT Re-evaluation, 97110-Therapeutic exercises, 97530- Therapeutic activity, [id number]- Neuromuscular re-education, 97535- Self Care, 16109- Manual therapy, [id number]- Canalith repositioning, [id number]- Aquatic Therapy, 97014- Electrical stimulation (unattended), [id number]- Electrical stimulation (manual), [id number]- Ultrasound, [id number]- Traction (mechanical), [id number]- Ionotophoresis 4mg /ml Dexamethasone, Patient/Family education, Balance training, Taping, Dry  Needling, Joint mobilization, Joint manipulation, Spinal manipulation, Spinal mobilization, Cryotherapy, and Moist heat  PLAN FOR NEXT SESSION: Assess and progress HEP as indicated, strengthening, ROM, manual/dry  needling as indicated   Reather Laurence, PT, DPT 03/07/23, 11:51 AM  [facility] [address] [place], [place] [address] Phone # [phone] Fax [phone]

## 2023-03-08 DIAGNOSIS — I872 Venous insufficiency (chronic) (peripheral): Secondary | ICD-10-CM | POA: Diagnosis not present

## 2023-03-08 DIAGNOSIS — L600 Ingrowing nail: Secondary | ICD-10-CM | POA: Diagnosis not present

## 2023-03-08 DIAGNOSIS — B351 Tinea unguium: Secondary | ICD-10-CM | POA: Diagnosis not present

## 2023-03-08 DIAGNOSIS — R238 Other skin changes: Secondary | ICD-10-CM | POA: Diagnosis not present

## 2023-03-08 DIAGNOSIS — L03031 Cellulitis of right toe: Secondary | ICD-10-CM | POA: Diagnosis not present

## 2023-03-12 ENCOUNTER — Ambulatory Visit: Payer: HMO | Attending: Neurosurgery | Admitting: Rehabilitative and Restorative Service Providers"

## 2023-03-12 ENCOUNTER — Encounter: Payer: Self-pay | Admitting: Rehabilitative and Restorative Service Providers"

## 2023-03-12 DIAGNOSIS — R252 Cramp and spasm: Secondary | ICD-10-CM

## 2023-03-12 DIAGNOSIS — R2689 Other abnormalities of gait and mobility: Secondary | ICD-10-CM

## 2023-03-12 DIAGNOSIS — M6281 Muscle weakness (generalized): Secondary | ICD-10-CM

## 2023-03-12 DIAGNOSIS — R42 Dizziness and giddiness: Secondary | ICD-10-CM | POA: Insufficient documentation

## 2023-03-12 DIAGNOSIS — M542 Cervicalgia: Secondary | ICD-10-CM | POA: Diagnosis not present

## 2023-03-12 NOTE — Therapy (Signed)
OUTPATIENT PHYSICAL THERAPY TREATMENT NOTE   Patient Name: James [name] MRN: [id number] DOB:05/20/1955, 67 y.o., male Today's Date: 03/12/2023  END OF SESSION:  PT End of Session - 03/12/23 0731     Visit Number 2    Date for PT Re-Evaluation 05/03/23    Authorization Type [employer]    Progress Note Due on Visit 10    PT Start Time 0729    PT Stop Time 0800    PT Time Calculation (min) 31 min    Activity Tolerance Patient tolerated treatment well    Behavior During Therapy [facility] for tasks assessed/performed             Past Medical History:  Diagnosis Date   Arthritis    Bilateral swelling of feet    Chicken pox    Complication of anesthesia    difficulty waking up   Depression    Hypertension    Joint pain    Obesity    Osteoarthritis    Prediabetes    Shortness of breath    Swelling    feet and legs   Vitamin D deficiency    Past Surgical History:  Procedure Laterality Date   cartilage removal     COLONOSCOPY WITH PROPOFOL N/A 12/14/2019   Procedure: COLONOSCOPY WITH PROPOFOL;  Surgeon: Sherrilyn Rist, MD;  Location: [facility];  Service: Gastroenterology;  Laterality: N/A;   KNEE SURGERY  2000   POLYPECTOMY  12/14/2019   Procedure: POLYPECTOMY;  Surgeon: Sherrilyn Rist, MD;  Location: [facility];  Service: Gastroenterology;;   TOTAL HIP ARTHROPLASTY Left 02/11/2019   Procedure: TOTAL HIP ARTHROPLASTY-POSTERIOR;  Surgeon: Ollen Gross, MD;  Location: [facility];  Service: Orthopedics;  Laterality: Left;    Patient Active Problem List   Diagnosis Date Noted   Depression 09/08/2020   Special screening for malignant neoplasms, colon    Benign neoplasm of rectum    OA (osteoarthritis) of hip 02/11/2019   Other hyperlipidemia 04/10/2017   Vitamin D deficiency 04/10/2017   Prediabetes 12/17/2016   Class 3 severe obesity with serious comorbidity and body mass index (BMI) of 50.0 to 59.9 in adult (HCC) 12/17/2016   At risk for  diabetes mellitus 10/02/2016   Hyperglycemia 09/04/2016   Severe obesity (BMI >= 40) (HCC) 01/15/2013   OBESITY, MORBID 01/20/2007   Essential hypertension 10/31/2006    PCP: Shirline Frees, NP  REFERRING PROVIDER: Julio Sicks, MD  REFERRING DIAG: [phone] (ICD-10-CM) - Spinal stenosis, cervical region  THERAPY DIAG:  Cervicalgia  Muscle weakness (generalized)  Cramp and spasm  Other abnormalities of gait and mobility  Rationale for Evaluation and Treatment: Rehabilitation  ONSET DATE: June 2024 pain started getting worse  SUBJECTIVE:  SUBJECTIVE STATEMENT: Patient reports that he is having increased knee pain today.  States that his knee pain is a 9/10.  Hand dominance: Right  PERTINENT HISTORY:  pre-diabetes, HTN, cervical radiculopathy, OSA, vertigo  PAIN:  Are you having pain? Yes: NPRS scale: 3/10 Pain location: cervical, scapular, shoulders Pain description: sharp, tingling into bilateral hands Aggravating factors: certain positions (especially with lying on his side) Relieving factors: certain supportive positions  PRECAUTIONS: None  RED FLAGS: None     WEIGHT BEARING RESTRICTIONS: No  FALLS:  Has patient fallen in last 6 months? No  LIVING ENVIRONMENT: Lives with: lives with their spouse lives with wife who is primarily w/c bound and needs assistance with most daily activities Lives in: House/apartment Stairs:  2 story home, but they live on one level.  They have a platform lift to help get to driveway. Has following equipment at home: Single point cane and Grab bars  OCCUPATION: Retired  PLOF: Independent  PATIENT GOALS: To avoid cervical surgery and some pain relief.  NEXT MD VISIT:  Dr Jordan Likes on 04/11/2023  OBJECTIVE:  Note: Objective measures  were completed at Evaluation unless otherwise noted.  DIAGNOSTIC FINDINGS:  Cervical MRI on 12/12/2022: IMPRESSION: 1. Degenerative disc bulge with uncovertebral and facet hypertrophy at C5-6 with resultant mild canal, with severe right worse than left C6 foraminal stenosis. Reactive endplate changes with marrow edema at this level could contribute to neck pain. 2. Mild left C7 foraminal narrowing related to disc bulge and uncovertebral disease. 3. Additional mild spondylosis elsewhere within the cervical spine as above without significant stenosis or impingement.  Lumbar MRI on 12/12/2022: IMPRESSION: 1. Mild spinal and bilateral lateral recess stenosis at L4-5. There is also a shallow broad-based left foraminal disc protrusion potentially irritating the left nerve root. 2. Mild bilateral foraminal encroachment at L3-4. 3. Advanced facet disease at L5-S1 but no spinal or foraminal stenosis.  PATIENT SURVEYS:  Eval:  NDI 32 / 50 = 64.0 %  COGNITION: Overall cognitive status: Within functional limits for tasks assessed  SENSATION: Pt reports tingling down bilateral UE  POSTURE: rounded shoulders, forward head, and flexed trunk   PALPATION: Tenderness to palpation with muscle spasms noted   CERVICAL ROM:   Active ROM A/ROM (deg) eval  Flexion 55  Extension 25  Right lateral flexion 30 with pain  Left lateral flexion 35  Right rotation 48  Left rotation 55   (Blank rows = not tested)  UPPER EXTREMITY ROM:  Eval:  WFL  UPPER EXTREMITY MMT:  Eval:   Right shoulder strength grossly 4-/5 Left is WFL  CERVICAL SPECIAL TESTS:  Eval:  Distraction test: decrease in pain  FUNCTIONAL TESTS:  Eval: 5 times sit to stand: 15.92 sec Timed up and go (TUG): 15.12 sec  TODAY'S TREATMENT:  DATE:  03/12/2023 Nustep level 1 x3 min with PT present to discuss  status Cervical SNAGs for rotation x10 bilat Cervical SNAGs for extension x10 Seated shoulder shoulder ER with red tband 2x10 Seated shoulder flexion, scaption, and abduction with 1# 2x10 each bilat Seated shoulder punches with 1# 2x10 Seated shoulder overhead press with 1# 2x10   03/07/2023 Reviewed HEP Manual Therapy:  Soft tissue mobilization and manual cervical traction to promote decreased pain.   PATIENT EDUCATION:  Education details: Issued HEP Person educated: Patient Education method: Explanation, Facilities manager, and Handouts Education comprehension: verbalized understanding  HOME EXERCISE PROGRAM: Access Code: [email or web address] URL: [email or web address] Date: 03/07/2023 Prepared by: Reather Laurence  Exercises - Seated Scapular Retraction  - 1 x daily - 7 x weekly - 2 sets - 10 reps - Seated Cervical Retraction  - 1 x daily - 7 x weekly - 2 sets - 10 reps - Seated Assisted Cervical Rotation with Towel  - 1 x daily - 7 x weekly - 1-2 sets - 10 reps - Mid-Lower Cervical Extension SNAG with Strap  - 1 x daily - 7 x weekly - 1-2 sets - 10 reps  ASSESSMENT:  CLINICAL IMPRESSION: Mr Bohlken presents to PT limited by increased bilateral knee pain today.  Patient able to progress with shoulder strengthening during session.  Patient with less pain in her cervical and shoulder pain today than on initial evaluation.  Patient continues to require skilled PT to progress towards goal related activities.  OBJECTIVE IMPAIRMENTS: decreased mobility, decreased ROM, dizziness, impaired perceived functional ability, increased muscle spasms, impaired UE functional use, postural dysfunction, and pain.   ACTIVITY LIMITATIONS: carrying, lifting, sleeping, and transfers  PARTICIPATION LIMITATIONS: cleaning, laundry, driving, and community activity  PERSONAL FACTORS: Past/current experiences, Time since onset of injury/illness/exacerbation, and 3+ comorbidities: pre-diabetes, carpal  tunnel, OA  are also affecting patient's functional outcome.   REHAB POTENTIAL: Good  CLINICAL DECISION MAKING: Evolving/moderate complexity  EVALUATION COMPLEXITY: Moderate   GOALS: Goals reviewed with patient? Yes  SHORT TERM GOALS: Target date: 03/22/2023  Pt will be independent with initial HEP. Baseline:  Goal status: Ongoing  2.  Patient will report at least a 25% improvement in symptoms since starting PT. Baseline:  Goal status: INITIAL   LONG TERM GOALS: Target date: 05/03/2023  Patient will be independent with advanced HEP. Baseline:  Goal status: INITIAL  2.  Patient will improve Neck Disability Index to no greater than 50% to demonstrate improvements in functional tasks. Baseline: 64% Goal status: INITIAL  3.  Patient will increase cervical A/ROM to [facility] without increased pain or dizziness to allow for improved ease with driving. Baseline:  Goal status: INITIAL  4.  Patient will increase right shoulder strength to at least 4/5 to allow him to be able to perform functional tasks with increased ease. Baseline:  Goal status: INITIAL  5.  Patient will report at least a 50% improvement in pain symptoms since starting PT. Baseline:  Goal status: INITIAL    PLAN:  PT FREQUENCY: 2x/week  PT DURATION: 8 weeks  PLANNED INTERVENTIONS: 97164- PT Re-evaluation, 97110-Therapeutic exercises, 97530- Therapeutic activity, [id number]- Neuromuscular re-education, 97535- Self Care, 09811- Manual therapy, [id number]- Canalith repositioning, [id number]- Aquatic Therapy, 97014- Electrical stimulation (unattended), [id number]- Electrical stimulation (manual), [id number]- Ultrasound, [id number]- Traction (mechanical), [id number]- Ionotophoresis 4mg /ml Dexamethasone, Patient/Family education, Balance training, Taping, Dry Needling, Joint mobilization, Joint manipulation, Spinal manipulation, Spinal mobilization, Cryotherapy, and Moist heat  PLAN FOR NEXT SESSION: Assess and progress HEP as indicated,  strengthening, ROM, manual/dry  needling as indicated   Reather Laurence, PT, DPT 03/12/23, 8:01 AM  [facility] [address], [address] [place], [place] [address] Phone # [phone] Fax [phone]

## 2023-03-14 ENCOUNTER — Ambulatory Visit: Payer: HMO | Admitting: Rehabilitative and Restorative Service Providers"

## 2023-03-18 ENCOUNTER — Ambulatory Visit: Payer: HMO | Admitting: Rehabilitative and Restorative Service Providers"

## 2023-03-18 DIAGNOSIS — M542 Cervicalgia: Secondary | ICD-10-CM | POA: Diagnosis not present

## 2023-03-18 DIAGNOSIS — R42 Dizziness and giddiness: Secondary | ICD-10-CM

## 2023-03-18 DIAGNOSIS — M6281 Muscle weakness (generalized): Secondary | ICD-10-CM

## 2023-03-18 DIAGNOSIS — R252 Cramp and spasm: Secondary | ICD-10-CM

## 2023-03-18 DIAGNOSIS — R2689 Other abnormalities of gait and mobility: Secondary | ICD-10-CM

## 2023-03-18 NOTE — Therapy (Signed)
OUTPATIENT PHYSICAL THERAPY TREATMENT NOTE   Patient Name: James [name] MRN: [id number] DOB:1955/11/04, 67 y.o., male Today's Date: 03/18/2023  END OF SESSION:  PT End of Session - 03/18/23 0755     Visit Number 3    Date for PT Re-Evaluation 05/03/23    Authorization Type [employer]    Progress Note Due on Visit 10    PT Start Time 0752    PT Stop Time 0830    PT Time Calculation (min) 38 min    Activity Tolerance Patient tolerated treatment well    Behavior During Therapy WFL for tasks assessed/performed             Past Medical History:  Diagnosis Date   Arthritis    Bilateral swelling of feet    Chicken pox    Complication of anesthesia    difficulty waking up   Depression    Hypertension    Joint pain    Obesity    Osteoarthritis    Prediabetes    Shortness of breath    Swelling    feet and legs   Vitamin D deficiency    Past Surgical History:  Procedure Laterality Date   cartilage removal     COLONOSCOPY WITH PROPOFOL N/A 12/14/2019   Procedure: COLONOSCOPY WITH PROPOFOL;  Surgeon: Sherrilyn Rist, MD;  Location: [facility];  Service: Gastroenterology;  Laterality: N/A;   KNEE SURGERY  2000   POLYPECTOMY  12/14/2019   Procedure: POLYPECTOMY;  Surgeon: Sherrilyn Rist, MD;  Location: [facility];  Service: Gastroenterology;;   TOTAL HIP ARTHROPLASTY Left 02/11/2019   Procedure: TOTAL HIP ARTHROPLASTY-POSTERIOR;  Surgeon: Ollen Gross, MD;  Location: [facility];  Service: Orthopedics;  Laterality: Left;    Patient Active Problem List   Diagnosis Date Noted   Depression 09/08/2020   Special screening for malignant neoplasms, colon    Benign neoplasm of rectum    OA (osteoarthritis) of hip 02/11/2019   Other hyperlipidemia 04/10/2017   Vitamin D deficiency 04/10/2017   Prediabetes 12/17/2016   Class 3 severe obesity with serious comorbidity and body mass index (BMI) of 50.0 to 59.9 in adult (HCC) 12/17/2016   At risk for  diabetes mellitus 10/02/2016   Hyperglycemia 09/04/2016   Severe obesity (BMI >= 40) (HCC) 01/15/2013   OBESITY, MORBID 01/20/2007   Essential hypertension 10/31/2006    PCP: Shirline Frees, NP  REFERRING PROVIDER: Julio Sicks, MD  REFERRING DIAG: [phone] (ICD-10-CM) - Spinal stenosis, cervical region  THERAPY DIAG:  Cervicalgia  Muscle weakness (generalized)  Cramp and spasm  Other abnormalities of gait and mobility  Dizziness and giddiness  Rationale for Evaluation and Treatment: Rehabilitation  ONSET DATE: June 2024 pain started getting worse  SUBJECTIVE:  SUBJECTIVE STATEMENT: Patient reports that he is having a bad headach of 9/10.  States that his knee pain is a 9/10.  Hand dominance: Right  PERTINENT HISTORY:  pre-diabetes, HTN, cervical radiculopathy, OSA, vertigo  PAIN:  Are you having pain? Yes: NPRS scale: 2/10 Pain location: cervical, scapular, shoulders Pain description: sharp, tingling into bilateral hands Aggravating factors: certain positions (especially with lying on his side) Relieving factors: certain supportive positions  PRECAUTIONS: None  RED FLAGS: None     WEIGHT BEARING RESTRICTIONS: No  FALLS:  Has patient fallen in last 6 months? No  LIVING ENVIRONMENT: Lives with: lives with their spouse lives with wife who is primarily w/c bound and needs assistance with most daily activities Lives in: House/apartment Stairs:  2 story home, but they live on one level.  They have a platform lift to help get to driveway. Has following equipment at home: Single point cane and Grab bars  OCCUPATION: Retired  PLOF: Independent  PATIENT GOALS: To avoid cervical surgery and some pain relief.  NEXT MD VISIT:  Dr Jordan Likes on 04/11/2023  OBJECTIVE:  Note:  Objective measures were completed at Evaluation unless otherwise noted.  DIAGNOSTIC FINDINGS:  Cervical MRI on 12/12/2022: IMPRESSION: 1. Degenerative disc bulge with uncovertebral and facet hypertrophy at C5-6 with resultant mild canal, with severe right worse than left C6 foraminal stenosis. Reactive endplate changes with marrow edema at this level could contribute to neck pain. 2. Mild left C7 foraminal narrowing related to disc bulge and uncovertebral disease. 3. Additional mild spondylosis elsewhere within the cervical spine as above without significant stenosis or impingement.  Lumbar MRI on 12/12/2022: IMPRESSION: 1. Mild spinal and bilateral lateral recess stenosis at L4-5. There is also a shallow broad-based left foraminal disc protrusion potentially irritating the left nerve root. 2. Mild bilateral foraminal encroachment at L3-4. 3. Advanced facet disease at L5-S1 but no spinal or foraminal stenosis.  PATIENT SURVEYS:  Eval:  NDI 32 / 50 = 64.0 %  COGNITION: Overall cognitive status: Within functional limits for tasks assessed  SENSATION: Pt reports tingling down bilateral UE  POSTURE: rounded shoulders, forward head, and flexed trunk   PALPATION: Tenderness to palpation with muscle spasms noted   CERVICAL ROM:   Active ROM A/ROM (deg) eval  Flexion 55  Extension 25  Right lateral flexion 30 with pain  Left lateral flexion 35  Right rotation 48  Left rotation 55   (Blank rows = not tested)  UPPER EXTREMITY ROM:  Eval:  WFL  UPPER EXTREMITY MMT:  Eval:   Right shoulder strength grossly 4-/5 Left is WFL  CERVICAL SPECIAL TESTS:  Eval:  Distraction test: decrease in pain  FUNCTIONAL TESTS:  Eval: 5 times sit to stand: 15.92 sec Timed up and go (TUG): 15.12 sec  TODAY'S TREATMENT:  DATE:  03/18/2023 Nustep level 1 x3 min with PT  present to discuss status Cervical SNAGs for rotation x10 bilat Cervical SNAGs for extension x10 Seated shoulder shoulder ER with red tband 2x10 Seated shoulder horizontal abduction with red tband 2x10 Seated shoulder flexion, scaption, and abduction with 1# 2x10 each bilat Ultrasound to cervical and upper trap regions:  3.3 Hz, 2.5 W/cm2, x10 min   03/12/2023 Nustep level 1 x3 min with PT present to discuss status Cervical SNAGs for rotation x10 bilat Cervical SNAGs for extension x10 Seated shoulder shoulder ER with red tband 2x10 Seated shoulder flexion, scaption, and abduction with 1# 2x10 each bilat Seated shoulder punches with 1# 2x10 Seated shoulder overhead press with 1# 2x10   03/07/2023 Reviewed HEP Manual Therapy:  Soft tissue mobilization and manual cervical traction to promote decreased pain.   PATIENT EDUCATION:  Education details: Issued HEP Person educated: Patient Education method: Explanation, Facilities manager, and Handouts Education comprehension: verbalized understanding  HOME EXERCISE PROGRAM: Access Code: [email or web address] URL: [email or web address] Date: 03/07/2023 Prepared by: Reather Laurence  Exercises - Seated Scapular Retraction  - 1 x daily - 7 x weekly - 2 sets - 10 reps - Seated Cervical Retraction  - 1 x daily - 7 x weekly - 2 sets - 10 reps - Seated Assisted Cervical Rotation with Towel  - 1 x daily - 7 x weekly - 1-2 sets - 10 reps - Mid-Lower Cervical Extension SNAG with Strap  - 1 x daily - 7 x weekly - 1-2 sets - 10 reps  ASSESSMENT:  CLINICAL IMPRESSION: Mr Toro presents to PT reporting increased headache pain and knee pain.  Patient was agreeable to trying ultrasound treatment today to cervical paraspinal region.  Following, patient did report feeling that his shoulders were feeling a little looser.  Patient with good response to treatment.  OBJECTIVE IMPAIRMENTS: decreased mobility, decreased ROM, dizziness, impaired perceived  functional ability, increased muscle spasms, impaired UE functional use, postural dysfunction, and pain.   ACTIVITY LIMITATIONS: carrying, lifting, sleeping, and transfers  PARTICIPATION LIMITATIONS: cleaning, laundry, driving, and community activity  PERSONAL FACTORS: Past/current experiences, Time since onset of injury/illness/exacerbation, and 3+ comorbidities: pre-diabetes, carpal tunnel, OA  are also affecting patient's functional outcome.   REHAB POTENTIAL: Good  CLINICAL DECISION MAKING: Evolving/moderate complexity  EVALUATION COMPLEXITY: Moderate   GOALS: Goals reviewed with patient? Yes  SHORT TERM GOALS: Target date: 03/22/2023  Pt will be independent with initial HEP. Baseline:  Goal status: Met on 03/18/23  2.  Patient will report at least a 25% improvement in symptoms since starting PT. Baseline:  Goal status: Ongoing   LONG TERM GOALS: Target date: 05/03/2023  Patient will be independent with advanced HEP. Baseline:  Goal status: INITIAL  2.  Patient will improve Neck Disability Index to no greater than 50% to demonstrate improvements in functional tasks. Baseline: 64% Goal status: INITIAL  3.  Patient will increase cervical A/ROM to [facility] without increased pain or dizziness to allow for improved ease with driving. Baseline:  Goal status: INITIAL  4.  Patient will increase right shoulder strength to at least 4/5 to allow him to be able to perform functional tasks with increased ease. Baseline:  Goal status: INITIAL  5.  Patient will report at least a 50% improvement in pain symptoms since starting PT. Baseline:  Goal status: INITIAL    PLAN:  PT FREQUENCY: 2x/week  PT DURATION: 8 weeks  PLANNED INTERVENTIONS: 97164- PT Re-evaluation, 97110-Therapeutic exercises, 97530- Therapeutic activity, [id number]- Neuromuscular re-education,  40981- Self Care, 19147- Manual therapy, [id number]- Canalith repositioning, [id number]- Aquatic Therapy, 97014- Electrical  stimulation (unattended), [id number]- Electrical stimulation (manual), [id number]- Ultrasound, [id number]- Traction (mechanical), [id number]- Ionotophoresis 4mg /ml Dexamethasone, Patient/Family education, Balance training, Taping, Dry Needling, Joint mobilization, Joint manipulation, Spinal manipulation, Spinal mobilization, Cryotherapy, and Moist heat  PLAN FOR NEXT SESSION: Assess and progress HEP as indicated, strengthening, ROM, manual/dry needling as indicated, assess response to ultrasound.   Reather Laurence, PT, DPT 03/18/23, 8:40 AM  [facility] [address] [place], [place] [address] Phone # [phone] Fax [phone]

## 2023-03-20 ENCOUNTER — Encounter: Payer: Self-pay | Admitting: Rehabilitative and Restorative Service Providers"

## 2023-03-20 ENCOUNTER — Ambulatory Visit: Payer: HMO | Admitting: Rehabilitative and Restorative Service Providers"

## 2023-03-20 DIAGNOSIS — M542 Cervicalgia: Secondary | ICD-10-CM | POA: Diagnosis not present

## 2023-03-20 DIAGNOSIS — M6281 Muscle weakness (generalized): Secondary | ICD-10-CM

## 2023-03-20 DIAGNOSIS — R252 Cramp and spasm: Secondary | ICD-10-CM

## 2023-03-20 DIAGNOSIS — R42 Dizziness and giddiness: Secondary | ICD-10-CM

## 2023-03-20 DIAGNOSIS — R2689 Other abnormalities of gait and mobility: Secondary | ICD-10-CM

## 2023-03-20 NOTE — Patient Instructions (Signed)

## 2023-03-20 NOTE — Therapy (Signed)
OUTPATIENT PHYSICAL THERAPY TREATMENT NOTE   Patient Name: James [name] MRN: [id number] DOB:August 10, 1955, 67 y.o., male Today's Date: 03/20/2023  END OF SESSION:  PT End of Session - 03/20/23 0729     Visit Number 4    Date for PT Re-Evaluation 05/03/23    Authorization Type [employer]    Progress Note Due on Visit 10    PT Start Time 0728    PT Stop Time 0808    PT Time Calculation (min) 40 min    Activity Tolerance Patient tolerated treatment well    Behavior During Therapy [facility] for tasks assessed/performed             Past Medical History:  Diagnosis Date   Arthritis    Bilateral swelling of feet    Chicken pox    Complication of anesthesia    difficulty waking up   Depression    Hypertension    Joint pain    Obesity    Osteoarthritis    Prediabetes    Shortness of breath    Swelling    feet and legs   Vitamin D deficiency    Past Surgical History:  Procedure Laterality Date   cartilage removal     COLONOSCOPY WITH PROPOFOL N/A 12/14/2019   Procedure: COLONOSCOPY WITH PROPOFOL;  Surgeon: Sherrilyn Rist, MD;  Location: [facility];  Service: Gastroenterology;  Laterality: N/A;   KNEE SURGERY  2000   POLYPECTOMY  12/14/2019   Procedure: POLYPECTOMY;  Surgeon: Sherrilyn Rist, MD;  Location: [facility];  Service: Gastroenterology;;   TOTAL HIP ARTHROPLASTY Left 02/11/2019   Procedure: TOTAL HIP ARTHROPLASTY-POSTERIOR;  Surgeon: Ollen Gross, MD;  Location: [facility];  Service: Orthopedics;  Laterality: Left;    Patient Active Problem List   Diagnosis Date Noted   Depression 09/08/2020   Special screening for malignant neoplasms, colon    Benign neoplasm of rectum    OA (osteoarthritis) of hip 02/11/2019   Other hyperlipidemia 04/10/2017   Vitamin D deficiency 04/10/2017   Prediabetes 12/17/2016   Class 3 severe obesity with serious comorbidity and body mass index (BMI) of 50.0 to 59.9 in adult (HCC) 12/17/2016   At risk for  diabetes mellitus 10/02/2016   Hyperglycemia 09/04/2016   Severe obesity (BMI >= 40) (HCC) 01/15/2013   OBESITY, MORBID 01/20/2007   Essential hypertension 10/31/2006    PCP: Shirline Frees, NP  REFERRING PROVIDER: Julio Sicks, MD  REFERRING DIAG: [phone] (ICD-10-CM) - Spinal stenosis, cervical region  THERAPY DIAG:  Cervicalgia  Muscle weakness (generalized)  Cramp and spasm  Other abnormalities of gait and mobility  Dizziness and giddiness  Rationale for Evaluation and Treatment: Rehabilitation  ONSET DATE: June 2024 pain started getting worse  SUBJECTIVE:  SUBJECTIVE STATEMENT: Patient reports that he is feeling okay this morning.  Cervical/shoulder pain of 2/10.  Hand dominance: Right  PERTINENT HISTORY:  pre-diabetes, HTN, cervical radiculopathy, OSA, vertigo  PAIN:  Are you having pain? Yes: NPRS scale: 2/10 Pain location: cervical, scapular, shoulders Pain description: sharp, tingling into bilateral hands Aggravating factors: certain positions (especially with lying on his side) Relieving factors: certain supportive positions  PRECAUTIONS: None  RED FLAGS: None     WEIGHT BEARING RESTRICTIONS: No  FALLS:  Has patient fallen in last 6 months? No  LIVING ENVIRONMENT: Lives with: lives with their spouse lives with wife who is primarily w/c bound and needs assistance with most daily activities Lives in: House/apartment Stairs:  2 story home, but they live on one level.  They have a platform lift to help get to driveway. Has following equipment at home: Single point cane and Grab bars  OCCUPATION: Retired  PLOF: Independent  PATIENT GOALS: To avoid cervical surgery and some pain relief.  NEXT MD VISIT:  Dr Jordan Likes on 04/11/2023  OBJECTIVE:  Note:  Objective measures were completed at Evaluation unless otherwise noted.  DIAGNOSTIC FINDINGS:  Cervical MRI on 12/12/2022: IMPRESSION: 1. Degenerative disc bulge with uncovertebral and facet hypertrophy at C5-6 with resultant mild canal, with severe right worse than left C6 foraminal stenosis. Reactive endplate changes with marrow edema at this level could contribute to neck pain. 2. Mild left C7 foraminal narrowing related to disc bulge and uncovertebral disease. 3. Additional mild spondylosis elsewhere within the cervical spine as above without significant stenosis or impingement.  Lumbar MRI on 12/12/2022: IMPRESSION: 1. Mild spinal and bilateral lateral recess stenosis at L4-5. There is also a shallow broad-based left foraminal disc protrusion potentially irritating the left nerve root. 2. Mild bilateral foraminal encroachment at L3-4. 3. Advanced facet disease at L5-S1 but no spinal or foraminal stenosis.  PATIENT SURVEYS:  Eval:  NDI 32 / 50 = 64.0 %  COGNITION: Overall cognitive status: Within functional limits for tasks assessed  SENSATION: Pt reports tingling down bilateral UE  POSTURE: rounded shoulders, forward head, and flexed trunk   PALPATION: Tenderness to palpation with muscle spasms noted   CERVICAL ROM:   Active ROM A/ROM (deg) eval  Flexion 55  Extension 25  Right lateral flexion 30 with pain  Left lateral flexion 35  Right rotation 48  Left rotation 55   (Blank rows = not tested)  UPPER EXTREMITY ROM:  Eval:  WFL  UPPER EXTREMITY MMT:  Eval:   Right shoulder strength grossly 4-/5 Left is WFL  CERVICAL SPECIAL TESTS:  Eval:  Distraction test: decrease in pain  FUNCTIONAL TESTS:  Eval: 5 times sit to stand: 15.92 sec Timed up and go (TUG): 15.12 sec  TODAY'S TREATMENT:                                                                                                                              DATE:  03/20/2023 Nustep level 1 x3 min with PT  present to discuss status Cervical SNAGs for rotation x10 bilat Cervical SNAGs for extension x10 Seated shoulder shoulder ER with red tband 2x10 Seated shoulder horizontal abduction with red tband 2x10 Seated shoulder flexion, scaption, and abduction with 1# 2x10 each bilat Seated shoulder rows and extension with red tband x20 each bilat Seated blue pball rollout x15 Trigger Point Dry-Needling  Treatment instructions: Expect mild to moderate muscle soreness. S/S of pneumothorax if dry needled over a lung field, and to seek immediate medical attention should they occur. Patient verbalized understanding of these instructions and education. Patient Consent Given: Yes Education handout provided: Yes Muscles treated: bilateral upper traps and levators Electrical stimulation performed: No Parameters: N/A Treatment response/outcome: Utilized skilled palpation to identify bony landmarks and trigger points.  Able to illicit twitch response and muscle elongation.  Soft tissue mobilization following to further promote tissue elongation.     03/18/2023 Nustep level 1 x3 min with PT present to discuss status Cervical SNAGs for rotation x10 bilat Cervical SNAGs for extension x10 Seated shoulder shoulder ER with red tband 2x10 Seated shoulder horizontal abduction with red tband 2x10 Seated shoulder flexion, scaption, and abduction with 1# 2x10 each bilat Ultrasound to cervical and upper trap regions:  3.3 Hz, 2.5 W/cm2, x10 min   03/12/2023 Nustep level 1 x3 min with PT present to discuss status Cervical SNAGs for rotation x10 bilat Cervical SNAGs for extension x10 Seated shoulder shoulder ER with red tband 2x10 Seated shoulder flexion, scaption, and abduction with 1# 2x10 each bilat Seated shoulder punches with 1# 2x10 Seated shoulder overhead press with 1# 2x10    PATIENT EDUCATION:  Education details: Issued HEP Person educated: Patient Education method: Explanation, Facilities manager, and  Handouts Education comprehension: verbalized understanding  HOME EXERCISE PROGRAM: Access Code: [email or web address] URL: [email or web address] Date: 03/07/2023 Prepared by: Reather Laurence  Exercises - Seated Scapular Retraction  - 1 x daily - 7 x weekly - 2 sets - 10 reps - Seated Cervical Retraction  - 1 x daily - 7 x weekly - 2 sets - 10 reps - Seated Assisted Cervical Rotation with Towel  - 1 x daily - 7 x weekly - 1-2 sets - 10 reps - Mid-Lower Cervical Extension SNAG with Strap  - 1 x daily - 7 x weekly - 1-2 sets - 10 reps  ASSESSMENT:  CLINICAL IMPRESSION: Mr Abraha presents to PT reporting that the ultrasound did seem to help some with relaxing his shoulders, but he is wanting to try dry needling today.  Patient with good participation during session with ability to perform increased exercises today.  Patient with strong twitch response to right upper trap.  With soft tissue mobilization at end of session, patient with further response with reports of decreased pain and muscle elongation.  Patient continues to require skilled PT to progress towards goal related activities.  OBJECTIVE IMPAIRMENTS: decreased mobility, decreased ROM, dizziness, impaired perceived functional ability, increased muscle spasms, impaired UE functional use, postural dysfunction, and pain.   ACTIVITY LIMITATIONS: carrying, lifting, sleeping, and transfers  PARTICIPATION LIMITATIONS: cleaning, laundry, driving, and community activity  PERSONAL FACTORS: Past/current experiences, Time since onset of injury/illness/exacerbation, and 3+ comorbidities: pre-diabetes, carpal tunnel, OA  are also affecting patient's functional outcome.   REHAB POTENTIAL: Good  CLINICAL DECISION MAKING: Evolving/moderate complexity  EVALUATION COMPLEXITY: Moderate   GOALS: Goals reviewed with patient? Yes  SHORT TERM GOALS: Target date: 03/22/2023  Pt will be independent with initial HEP. Baseline:  Goal status: Met  on 03/18/23  2.  Patient will report at least a 25% improvement in symptoms since starting PT. Baseline:  Goal status: Ongoing   LONG TERM GOALS: Target date: 05/03/2023  Patient will be independent with advanced HEP. Baseline:  Goal status: INITIAL  2.  Patient will improve Neck Disability Index to no greater than 50% to demonstrate improvements in functional tasks. Baseline: 64% Goal status: INITIAL  3.  Patient will increase cervical A/ROM to [facility] without increased pain or dizziness to allow for improved ease with driving. Baseline:  Goal status: INITIAL  4.  Patient will increase right shoulder strength to at least 4/5 to allow him to be able to perform functional tasks with increased ease. Baseline:  Goal status: INITIAL  5.  Patient will report at least a 50% improvement in pain symptoms since starting PT. Baseline:  Goal status: INITIAL    PLAN:  PT FREQUENCY: 2x/week  PT DURATION: 8 weeks  PLANNED INTERVENTIONS: 97164- PT Re-evaluation, 97110-Therapeutic exercises, 97530- Therapeutic activity, [id number]- Neuromuscular re-education, 97535- Self Care, 78295- Manual therapy, [id number]- Canalith repositioning, [id number]- Aquatic Therapy, 97014- Electrical stimulation (unattended), [id number]- Electrical stimulation (manual), [id number]- Ultrasound, [id number]- Traction (mechanical), [id number]- Ionotophoresis 4mg /ml Dexamethasone, Patient/Family education, Balance training, Taping, Dry Needling, Joint mobilization, Joint manipulation, Spinal manipulation, Spinal mobilization, Cryotherapy, and Moist heat  PLAN FOR NEXT SESSION: Assess and progress HEP as indicated, strengthening, ROM, manual/dry needling as indicated, assess response to ultrasound.   Reather Laurence, PT, DPT 03/20/23, 8:21 AM  [facility] [address] [place], [place] [address] Phone # [phone] Fax [phone]

## 2023-03-23 DIAGNOSIS — B351 Tinea unguium: Secondary | ICD-10-CM | POA: Diagnosis not present

## 2023-03-25 ENCOUNTER — Encounter: Payer: Self-pay | Admitting: Rehabilitative and Restorative Service Providers"

## 2023-03-25 ENCOUNTER — Ambulatory Visit: Payer: HMO | Admitting: Rehabilitative and Restorative Service Providers"

## 2023-03-25 DIAGNOSIS — R252 Cramp and spasm: Secondary | ICD-10-CM

## 2023-03-25 DIAGNOSIS — M542 Cervicalgia: Secondary | ICD-10-CM

## 2023-03-25 DIAGNOSIS — M6281 Muscle weakness (generalized): Secondary | ICD-10-CM

## 2023-03-25 DIAGNOSIS — R2689 Other abnormalities of gait and mobility: Secondary | ICD-10-CM

## 2023-03-25 NOTE — Therapy (Signed)
OUTPATIENT PHYSICAL THERAPY TREATMENT NOTE   Patient Name: James [name] MRN: [id number] DOB:1955-09-16, 67 y.o., male Today's Date: 03/25/2023  END OF SESSION:  PT End of Session - 03/25/23 0753     Visit Number 5    Date for PT Re-Evaluation 05/03/23    Authorization Type [employer]    Progress Note Due on Visit 10    PT Start Time 0751    PT Stop Time 0830    PT Time Calculation (min) 39 min    Activity Tolerance Patient tolerated treatment well    Behavior During Therapy [facility] for tasks assessed/performed             Past Medical History:  Diagnosis Date   Arthritis    Bilateral swelling of feet    Chicken pox    Complication of anesthesia    difficulty waking up   Depression    Hypertension    Joint pain    Obesity    Osteoarthritis    Prediabetes    Shortness of breath    Swelling    feet and legs   Vitamin D deficiency    Past Surgical History:  Procedure Laterality Date   cartilage removal     COLONOSCOPY WITH PROPOFOL N/A 12/14/2019   Procedure: COLONOSCOPY WITH PROPOFOL;  Surgeon: Sherrilyn Rist, MD;  Location: [facility];  Service: Gastroenterology;  Laterality: N/A;   KNEE SURGERY  2000   POLYPECTOMY  12/14/2019   Procedure: POLYPECTOMY;  Surgeon: Sherrilyn Rist, MD;  Location: [facility];  Service: Gastroenterology;;   TOTAL HIP ARTHROPLASTY Left 02/11/2019   Procedure: TOTAL HIP ARTHROPLASTY-POSTERIOR;  Surgeon: Ollen Gross, MD;  Location: [facility];  Service: Orthopedics;  Laterality: Left;    Patient Active Problem List   Diagnosis Date Noted   Depression 09/08/2020   Special screening for malignant neoplasms, colon    Benign neoplasm of rectum    OA (osteoarthritis) of hip 02/11/2019   Other hyperlipidemia 04/10/2017   Vitamin D deficiency 04/10/2017   Prediabetes 12/17/2016   Class 3 severe obesity with serious comorbidity and body mass index (BMI) of 50.0 to 59.9 in adult (HCC) 12/17/2016   At risk for  diabetes mellitus 10/02/2016   Hyperglycemia 09/04/2016   Severe obesity (BMI >= 40) (HCC) 01/15/2013   OBESITY, MORBID 01/20/2007   Essential hypertension 10/31/2006    PCP: Shirline Frees, NP  REFERRING PROVIDER: Julio Sicks, MD  REFERRING DIAG: [phone] (ICD-10-CM) - Spinal stenosis, cervical region  THERAPY DIAG:  Cervicalgia  Muscle weakness (generalized)  Cramp and spasm  Other abnormalities of gait and mobility  Rationale for Evaluation and Treatment: Rehabilitation  ONSET DATE: June 2024 pain started getting worse  SUBJECTIVE:  SUBJECTIVE STATEMENT: Patient reports that he had some soreness the day after dry needling, but states that he did not feel as tight after.  Pt reports that he goes tomorrow for the injection.  Hand dominance: Right  PERTINENT HISTORY:  pre-diabetes, HTN, cervical radiculopathy, OSA, vertigo  PAIN:  Are you having pain? Yes: NPRS scale: 3/10 Pain location: cervical, scapular, shoulders Pain description: sharp, tingling into bilateral hands Aggravating factors: certain positions (especially with lying on his side) Relieving factors: certain supportive positions  PRECAUTIONS: None  RED FLAGS: None     WEIGHT BEARING RESTRICTIONS: No  FALLS:  Has patient fallen in last 6 months? No  LIVING ENVIRONMENT: Lives with: lives with their spouse lives with wife who is primarily w/c bound and needs assistance with most daily activities Lives in: House/apartment Stairs:  2 story home, but they live on one level.  They have a platform lift to help get to driveway. Has following equipment at home: Single point cane and Grab bars  OCCUPATION: Retired  PLOF: Independent  PATIENT GOALS: To avoid cervical surgery and some pain relief.  NEXT MD  VISIT:  Dr Jordan Likes on 04/11/2023  OBJECTIVE:  Note: Objective measures were completed at Evaluation unless otherwise noted.  DIAGNOSTIC FINDINGS:  Cervical MRI on 12/12/2022: IMPRESSION: 1. Degenerative disc bulge with uncovertebral and facet hypertrophy at C5-6 with resultant mild canal, with severe right worse than left C6 foraminal stenosis. Reactive endplate changes with marrow edema at this level could contribute to neck pain. 2. Mild left C7 foraminal narrowing related to disc bulge and uncovertebral disease. 3. Additional mild spondylosis elsewhere within the cervical spine as above without significant stenosis or impingement.  Lumbar MRI on 12/12/2022: IMPRESSION: 1. Mild spinal and bilateral lateral recess stenosis at L4-5. There is also a shallow broad-based left foraminal disc protrusion potentially irritating the left nerve root. 2. Mild bilateral foraminal encroachment at L3-4. 3. Advanced facet disease at L5-S1 but no spinal or foraminal stenosis.  PATIENT SURVEYS:  Eval:  NDI 32 / 50 = 64.0 %  COGNITION: Overall cognitive status: Within functional limits for tasks assessed  SENSATION: Pt reports tingling down bilateral UE  POSTURE: rounded shoulders, forward head, and flexed trunk   PALPATION: Tenderness to palpation with muscle spasms noted   CERVICAL ROM:   Active ROM A/ROM (deg) eval  Flexion 55  Extension 25  Right lateral flexion 30 with pain  Left lateral flexion 35  Right rotation 48  Left rotation 55   (Blank rows = not tested)  UPPER EXTREMITY ROM:  Eval:  WFL  UPPER EXTREMITY MMT:  Eval:   Right shoulder strength grossly 4-/5 Left is WFL  CERVICAL SPECIAL TESTS:  Eval:  Distraction test: decrease in pain  FUNCTIONAL TESTS:  Eval: 5 times sit to stand: 15.92 sec Timed up and go (TUG): 15.12 sec  TODAY'S TREATMENT:  DATE:  03/25/2023 Nustep level 1 x3 min with PT present to discuss status Cervical SNAGs for rotation x10 bilat Cervical SNAGs for extension x10 Seated shoulder shoulder ER with red tband 2x10 Seated shoulder horizontal abduction with red tband 2x10 Seated shoulder flexion, scaption, and abduction with 1# 2x10 each bilat Seated shoulder punches with 1# 2x10 Seated shoulder rows and extension with red tband x20 each bilat Seated blue pball rollout x15 Ultrasound to cervical and upper trap regions:  3.3 Hz, 2.5 W/cm2, 50% duty cycle, x10 min   03/20/2023 Nustep level 1 x3 min with PT present to discuss status Cervical SNAGs for rotation x10 bilat Cervical SNAGs for extension x10 Seated shoulder shoulder ER with red tband 2x10 Seated shoulder horizontal abduction with red tband 2x10 Seated shoulder flexion, scaption, and abduction with 1# 2x10 each bilat Seated shoulder rows and extension with red tband x20 each bilat Seated blue pball rollout x15 Trigger Point Dry-Needling  Treatment instructions: Expect mild to moderate muscle soreness. S/S of pneumothorax if dry needled over a lung field, and to seek immediate medical attention should they occur. Patient verbalized understanding of these instructions and education. Patient Consent Given: Yes Education handout provided: Yes Muscles treated: bilateral upper traps and levators Electrical stimulation performed: No Parameters: N/A Treatment response/outcome: Utilized skilled palpation to identify bony landmarks and trigger points.  Able to illicit twitch response and muscle elongation.  Soft tissue mobilization following to further promote tissue elongation.     03/18/2023 Nustep level 1 x3 min with PT present to discuss status Cervical SNAGs for rotation x10 bilat Cervical SNAGs for extension x10 Seated shoulder shoulder ER with red tband 2x10 Seated shoulder horizontal abduction with red tband 2x10 Seated shoulder flexion,  scaption, and abduction with 1# 2x10 each bilat Ultrasound to cervical and upper trap regions:  3.3 Hz, 2.5 W/cm2, x10 min    PATIENT EDUCATION:  Education details: Issued HEP Person educated: Patient Education method: Explanation, Facilities manager, and Handouts Education comprehension: verbalized understanding  HOME EXERCISE PROGRAM: Access Code: [email or web address] URL: [email or web address] Date: 03/07/2023 Prepared by: Reather Laurence  Exercises - Seated Scapular Retraction  - 1 x daily - 7 x weekly - 2 sets - 10 reps - Seated Cervical Retraction  - 1 x daily - 7 x weekly - 2 sets - 10 reps - Seated Assisted Cervical Rotation with Towel  - 1 x daily - 7 x weekly - 1-2 sets - 10 reps - Mid-Lower Cervical Extension SNAG with Strap  - 1 x daily - 7 x weekly - 1-2 sets - 10 reps  ASSESSMENT:  CLINICAL IMPRESSION: Mr Carkhuff presents to PT reporting that the dry needling did help some, stating that he gets his injection tomorrow morning.  Patient continues to have increased pain and tightness reported.  Patient with good response to ultrasound reporting feeling better following.  Encouraged pt to ask MD tomorrow if there will be any restrictions following injection tomorrow morning, he verbalizes his understanding.  OBJECTIVE IMPAIRMENTS: decreased mobility, decreased ROM, dizziness, impaired perceived functional ability, increased muscle spasms, impaired UE functional use, postural dysfunction, and pain.   ACTIVITY LIMITATIONS: carrying, lifting, sleeping, and transfers  PARTICIPATION LIMITATIONS: cleaning, laundry, driving, and community activity  PERSONAL FACTORS: Past/current experiences, Time since onset of injury/illness/exacerbation, and 3+ comorbidities: pre-diabetes, carpal tunnel, OA  are also affecting patient's functional outcome.   REHAB POTENTIAL: Good  CLINICAL DECISION MAKING: Evolving/moderate complexity  EVALUATION COMPLEXITY: Moderate   GOALS: Goals  reviewed with patient? Yes  SHORT  TERM GOALS: Target date: 03/22/2023  Pt will be independent with initial HEP. Baseline:  Goal status: Met on 03/18/23  2.  Patient will report at least a 25% improvement in symptoms since starting PT. Baseline:  Goal status: Ongoing   LONG TERM GOALS: Target date: 05/03/2023  Patient will be independent with advanced HEP. Baseline:  Goal status: Ongoing  2.  Patient will improve Neck Disability Index to no greater than 50% to demonstrate improvements in functional tasks. Baseline: 64% Goal status: INITIAL  3.  Patient will increase cervical A/ROM to [facility] without increased pain or dizziness to allow for improved ease with driving. Baseline:  Goal status: INITIAL  4.  Patient will increase right shoulder strength to at least 4/5 to allow him to be able to perform functional tasks with increased ease. Baseline:  Goal status: INITIAL  5.  Patient will report at least a 50% improvement in pain symptoms since starting PT. Baseline:  Goal status: INITIAL    PLAN:  PT FREQUENCY: 2x/week  PT DURATION: 8 weeks  PLANNED INTERVENTIONS: 97164- PT Re-evaluation, 97110-Therapeutic exercises, 97530- Therapeutic activity, 97112- Neuromuscular re-education, 97535- Self Care, 16109- Manual therapy, [id number]- Canalith repositioning, [id number]- Aquatic Therapy, 97014- Electrical stimulation (unattended), [id number]- Electrical stimulation (manual), [id number]- Ultrasound, 60454- Traction (mechanical), [id number]- Ionotophoresis 4mg /ml Dexamethasone, Patient/Family education, Balance training, Taping, Dry Needling, Joint mobilization, Joint manipulation, Spinal manipulation, Spinal mobilization, Cryotherapy, and Moist heat  PLAN FOR NEXT SESSION: Assess and progress HEP as indicated, strengthening, ROM, manual/dry needling as indicated, assess response to ultrasound.   Reather Laurence, PT, DPT 03/25/23, 8:43 AM  [facility] [address] [place], [place] [address] Phone # [phone] Fax [phone]

## 2023-03-26 DIAGNOSIS — M5412 Radiculopathy, cervical region: Secondary | ICD-10-CM | POA: Diagnosis not present

## 2023-03-27 ENCOUNTER — Encounter: Payer: Self-pay | Admitting: Rehabilitative and Restorative Service Providers"

## 2023-03-27 ENCOUNTER — Ambulatory Visit: Payer: HMO | Admitting: Rehabilitative and Restorative Service Providers"

## 2023-03-27 DIAGNOSIS — R252 Cramp and spasm: Secondary | ICD-10-CM

## 2023-03-27 DIAGNOSIS — M6281 Muscle weakness (generalized): Secondary | ICD-10-CM

## 2023-03-27 DIAGNOSIS — M542 Cervicalgia: Secondary | ICD-10-CM

## 2023-03-27 DIAGNOSIS — R2689 Other abnormalities of gait and mobility: Secondary | ICD-10-CM

## 2023-03-27 DIAGNOSIS — R42 Dizziness and giddiness: Secondary | ICD-10-CM

## 2023-03-27 NOTE — Therapy (Signed)
OUTPATIENT PHYSICAL THERAPY TREATMENT NOTE   Patient Name: James [name] MRN: [id number] DOB:December 07, 1955, 67 y.o., male Today's Date: 03/27/2023  END OF SESSION:  PT End of Session - 03/27/23 0759     Visit Number 6    Date for PT Re-Evaluation 05/03/23    Authorization Type [employer]    Progress Note Due on Visit 10    PT Start Time 0758    PT Stop Time 0840    PT Time Calculation (min) 42 min    Activity Tolerance Patient tolerated treatment well    Behavior During Therapy [facility] for tasks assessed/performed             Past Medical History:  Diagnosis Date   Arthritis    Bilateral swelling of feet    Chicken pox    Complication of anesthesia    difficulty waking up   Depression    Hypertension    Joint pain    Obesity    Osteoarthritis    Prediabetes    Shortness of breath    Swelling    feet and legs   Vitamin D deficiency    Past Surgical History:  Procedure Laterality Date   cartilage removal     COLONOSCOPY WITH PROPOFOL N/A 12/14/2019   Procedure: COLONOSCOPY WITH PROPOFOL;  Surgeon: Sherrilyn Rist, MD;  Location: [facility];  Service: Gastroenterology;  Laterality: N/A;   KNEE SURGERY  2000   POLYPECTOMY  12/14/2019   Procedure: POLYPECTOMY;  Surgeon: Sherrilyn Rist, MD;  Location: [facility];  Service: Gastroenterology;;   TOTAL HIP ARTHROPLASTY Left 02/11/2019   Procedure: TOTAL HIP ARTHROPLASTY-POSTERIOR;  Surgeon: Ollen Gross, MD;  Location: [facility];  Service: Orthopedics;  Laterality: Left;    Patient Active Problem List   Diagnosis Date Noted   Depression 09/08/2020   Special screening for malignant neoplasms, colon    Benign neoplasm of rectum    OA (osteoarthritis) of hip 02/11/2019   Other hyperlipidemia 04/10/2017   Vitamin D deficiency 04/10/2017   Prediabetes 12/17/2016   Class 3 severe obesity with serious comorbidity and body mass index (BMI) of 50.0 to 59.9 in adult (HCC) 12/17/2016   At risk for  diabetes mellitus 10/02/2016   Hyperglycemia 09/04/2016   Severe obesity (BMI >= 40) (HCC) 01/15/2013   OBESITY, MORBID 01/20/2007   Essential hypertension 10/31/2006    PCP: Shirline Frees, NP  REFERRING PROVIDER: Julio Sicks, MD  REFERRING DIAG: [phone] (ICD-10-CM) - Spinal stenosis, cervical region  THERAPY DIAG:  Cervicalgia  Muscle weakness (generalized)  Cramp and spasm  Other abnormalities of gait and mobility  Dizziness and giddiness  Rationale for Evaluation and Treatment: Rehabilitation  ONSET DATE: June 2024 pain started getting worse  SUBJECTIVE:  SUBJECTIVE STATEMENT: Patient reports that he had his injection.  States that he had some pain the rest of the day and yesterday.  States that he is having some hot flashes today.  Patient states that his pain does feel some better.  Pt reports this morning, he is feeling 80% better.  Hand dominance: Right  PERTINENT HISTORY:  pre-diabetes, HTN, cervical radiculopathy, OSA, vertigo  PAIN:  Are you having pain? Yes: NPRS scale: 2/10 Pain location: cervical, scapular, shoulders Pain description: sharp, tingling into bilateral hands Aggravating factors: certain positions (especially with lying on his side) Relieving factors: certain supportive positions  PRECAUTIONS: None  RED FLAGS: None     WEIGHT BEARING RESTRICTIONS: No  FALLS:  Has patient fallen in last 6 months? No  LIVING ENVIRONMENT: Lives with: lives with their spouse lives with wife who is primarily w/c bound and needs assistance with most daily activities Lives in: House/apartment Stairs:  2 story home, but they live on one level.  They have a platform lift to help get to driveway. Has following equipment at home: Single point cane and Grab  bars  OCCUPATION: Retired  PLOF: Independent  PATIENT GOALS: To avoid cervical surgery and some pain relief.  NEXT MD VISIT:  Dr Jordan Likes on 04/11/2023  OBJECTIVE:  Note: Objective measures were completed at Evaluation unless otherwise noted.  DIAGNOSTIC FINDINGS:  Cervical MRI on 12/12/2022: IMPRESSION: 1. Degenerative disc bulge with uncovertebral and facet hypertrophy at C5-6 with resultant mild canal, with severe right worse than left C6 foraminal stenosis. Reactive endplate changes with marrow edema at this level could contribute to neck pain. 2. Mild left C7 foraminal narrowing related to disc bulge and uncovertebral disease. 3. Additional mild spondylosis elsewhere within the cervical spine as above without significant stenosis or impingement.  Lumbar MRI on 12/12/2022: IMPRESSION: 1. Mild spinal and bilateral lateral recess stenosis at L4-5. There is also a shallow broad-based left foraminal disc protrusion potentially irritating the left nerve root. 2. Mild bilateral foraminal encroachment at L3-4. 3. Advanced facet disease at L5-S1 but no spinal or foraminal stenosis.  PATIENT SURVEYS:  Eval:  NDI 32 / 50 = 64.0 %  COGNITION: Overall cognitive status: Within functional limits for tasks assessed  SENSATION: Pt reports tingling down bilateral UE  POSTURE: rounded shoulders, forward head, and flexed trunk   PALPATION: Tenderness to palpation with muscle spasms noted   CERVICAL ROM:   Active ROM A/ROM (deg) eval  Flexion 55  Extension 25  Right lateral flexion 30 with pain  Left lateral flexion 35  Right rotation 48  Left rotation 55   (Blank rows = not tested)  UPPER EXTREMITY ROM:  Eval:  WFL  UPPER EXTREMITY MMT:  Eval:   Right shoulder strength grossly 4-/5 Left is WFL  CERVICAL SPECIAL TESTS:  Eval:  Distraction test: decrease in pain  FUNCTIONAL TESTS:  Eval: 5 times sit to stand: 15.92 sec Timed up and go (TUG): 15.12 sec  TODAY'S TREATMENT:  DATE:  03/27/2023 Nustep level 1 x4 min with PT present to discuss status Cervical SNAGs for rotation x10 bilat Cervical SNAGs for extension x10 Seated shoulder shoulder ER with red tband 2x10 Seated shoulder horizontal abduction with red tband 2x10 Seated shoulder D2 with red tband 2x10 Seated shoulder flexion, scaption, and abduction with 1# 2x10 each bilat Seated shoulder rows and extension with red tband x20 each bilat Seated shoulder punches with 2# 2x10 Seated blue pball rollout x15 Seated upper trap stretch 2x20 sec bilat   03/25/2023 Nustep level 1 x3 min with PT present to discuss status Cervical SNAGs for rotation x10 bilat Cervical SNAGs for extension x10 Seated shoulder shoulder ER with red tband 2x10 Seated shoulder horizontal abduction with red tband 2x10 Seated shoulder flexion, scaption, and abduction with 1# 2x10 each bilat Seated shoulder punches with 1# 2x10 Seated shoulder rows and extension with red tband x20 each bilat Seated blue pball rollout x15 Ultrasound to cervical and upper trap regions:  3.3 Hz, 2.5 W/cm2, 50% duty cycle, x10 min   03/20/2023 Nustep level 1 x3 min with PT present to discuss status Cervical SNAGs for rotation x10 bilat Cervical SNAGs for extension x10 Seated shoulder shoulder ER with red tband 2x10 Seated shoulder horizontal abduction with red tband 2x10 Seated shoulder flexion, scaption, and abduction with 1# 2x10 each bilat Seated shoulder rows and extension with red tband x20 each bilat Seated blue pball rollout x15 Trigger Point Dry-Needling  Treatment instructions: Expect mild to moderate muscle soreness. S/S of pneumothorax if dry needled over a lung field, and to seek immediate medical attention should they occur. Patient verbalized understanding of these instructions and education. Patient Consent  Given: Yes Education handout provided: Yes Muscles treated: bilateral upper traps and levators Electrical stimulation performed: No Parameters: N/A Treatment response/outcome: Utilized skilled palpation to identify bony landmarks and trigger points.  Able to illicit twitch response and muscle elongation.  Soft tissue mobilization following to further promote tissue elongation.     PATIENT EDUCATION:  Education details: Issued HEP Person educated: Patient Education method: Explanation, Facilities manager, and Handouts Education comprehension: verbalized understanding  HOME EXERCISE PROGRAM: Access Code: [email or web address] URL: [email or web address] Date: 03/07/2023 Prepared by: Clydie [name] Matti Minney  Exercises - Seated Scapular Retraction  - 1 x daily - 7 x weekly - 2 sets - 10 reps - Seated Cervical Retraction  - 1 x daily - 7 x weekly - 2 sets - 10 reps - Seated Assisted Cervical Rotation with Towel  - 1 x daily - 7 x weekly - 1-2 sets - 10 reps - Mid-Lower Cervical Extension SNAG with Strap  - 1 x daily - 7 x weekly - 1-2 sets - 10 reps  ASSESSMENT:  CLINICAL IMPRESSION: Mr Hewatt presents to PT reporting that he had initial pain following his injection, but he is feeling better now with 80% improvement reported.  Patient states that he feels that his motion is better and he is able to perform more activities.  Patient is progressing well towards goal related activities. Patient able to increase resistance on some of the exercises during session today.  OBJECTIVE IMPAIRMENTS: decreased mobility, decreased ROM, dizziness, impaired perceived functional ability, increased muscle spasms, impaired UE functional use, postural dysfunction, and pain.   ACTIVITY LIMITATIONS: carrying, lifting, sleeping, and transfers  PARTICIPATION LIMITATIONS: cleaning, laundry, driving, and community activity  PERSONAL FACTORS: Past/current experiences, Time since onset of injury/illness/exacerbation, and  3+ comorbidities: pre-diabetes, carpal tunnel, OA  are also affecting patient's functional outcome.  REHAB POTENTIAL: Good  CLINICAL DECISION MAKING: Evolving/moderate complexity  EVALUATION COMPLEXITY: Moderate   GOALS: Goals reviewed with patient? Yes  SHORT TERM GOALS: Target date: 03/22/2023  Pt will be independent with initial HEP. Baseline:  Goal status: Met on 03/18/23  2.  Patient will report at least a 25% improvement in symptoms since starting PT. Baseline:  Goal status: Met on 03/27/2023   LONG TERM GOALS: Target date: 05/03/2023  Patient will be independent with advanced HEP. Baseline:  Goal status: Ongoing  2.  Patient will improve Neck Disability Index to no greater than 50% to demonstrate improvements in functional tasks. Baseline: 64% Goal status: INITIAL  3.  Patient will increase cervical A/ROM to [facility] without increased pain or dizziness to allow for improved ease with driving. Baseline:  Goal status: Ongoing  4.  Patient will increase right shoulder strength to at least 4/5 to allow him to be able to perform functional tasks with increased ease. Baseline:  Goal status: Ongoing  5.  Patient will report at least a 50% improvement in pain symptoms since starting PT. Baseline:  Goal status: Met on 03/27/2023 (following cervical injection)    PLAN:  PT FREQUENCY: 2x/week  PT DURATION: 8 weeks  PLANNED INTERVENTIONS: 97164- PT Re-evaluation, 97110-Therapeutic exercises, 97530- Therapeutic activity, 97112- Neuromuscular re-education, 97535- Self Care, 40981- Manual therapy, [id number]- Canalith repositioning, [id number]- Aquatic Therapy, 97014- Electrical stimulation (unattended), [id number]- Electrical stimulation (manual), [id number]- Ultrasound, 19147- Traction (mechanical), [id number]- Ionotophoresis 4mg /ml Dexamethasone, Patient/Family education, Balance training, Taping, Dry Needling, Joint mobilization, Joint manipulation, Spinal manipulation, Spinal mobilization,  Cryotherapy, and Moist heat  PLAN FOR NEXT SESSION: Assess and progress HEP as indicated, strengthening, ROM, manual/dry needling as indicated, assess response to ultrasound.   Reather Laurence, PT, DPT 03/27/23, 8:46 AM  [facility] [address] [place], [place] [address] Phone # [phone] Fax [phone]

## 2023-04-01 ENCOUNTER — Ambulatory Visit: Payer: HMO | Admitting: Rehabilitative and Restorative Service Providers"

## 2023-04-01 ENCOUNTER — Ambulatory Visit (INDEPENDENT_AMBULATORY_CARE_PROVIDER_SITE_OTHER): Payer: HMO | Admitting: Family Medicine

## 2023-04-01 ENCOUNTER — Telehealth: Payer: Self-pay | Admitting: Rehabilitative and Restorative Service Providers"

## 2023-04-01 VITALS — BP 122/60 | HR 75 | Temp 98.3°F | Ht 65.0 in | Wt 352.5 lb

## 2023-04-01 DIAGNOSIS — R202 Paresthesia of skin: Secondary | ICD-10-CM | POA: Diagnosis not present

## 2023-04-01 DIAGNOSIS — G4452 New daily persistent headache (NDPH): Secondary | ICD-10-CM

## 2023-04-01 NOTE — Progress Notes (Signed)
Established Patient Office Visit  Subjective   Patient ID: SHRISH STATE, male    DOB: [birth year]  Age: 67 y.o. MRN: [id number]  Chief Complaint  Patient presents with   Numbness    Patient complains of upper extremity numbness,    Shortness of Breath    HPI   James [name] is seen today with recent symptoms of numbness involving left side of his body predominantly left arm and left lower extremity.  His recent history is that he states he had steroid injection cervical spine 6 days ago on Tuesday.  He felt fine afterwards and Wednesday went for physical therapy.  By Wednesday evening he developed some bilateral shoulder pain and some transient dyspnea.  No chest pain.  Applied some ice and shoulder pain improved somewhat.  Friday he woke up with some paresthesias involving left upper extremity and left lower extremity but no associated weakness. Symptoms lasted for about one hour.    He described this as somewhat of a tingling sensation.  No speech changes.  No confusion.  No right-sided symptoms.  No visual changes.  He had history of carotid Dopplers 11/23 with no significant obstruction.  No recent chest pains or palpitations.  He does relate having more frequent headaches past few months and these have been almost daily and bifrontal and relatively mild but persistent.  No reported exertional headache but he is very sedentary.  Chronic problems include hypertension, osteoarthritis, morbid obesity, hyperlipidemia, history of prediabetes.  A1c back in June 6.0.  Past Medical History:  Diagnosis Date   Arthritis    Bilateral swelling of feet    Chicken pox    Complication of anesthesia    difficulty waking up   Depression    Hypertension    Joint pain    Obesity    Osteoarthritis    Prediabetes    Shortness of breath    Swelling    feet and legs   Vitamin D deficiency    Past Surgical History:  Procedure Laterality Date   cartilage removal     COLONOSCOPY WITH PROPOFOL N/A  12/14/2019   Procedure: COLONOSCOPY WITH PROPOFOL;  Surgeon: Sherrilyn Rist, MD;  Location: [facility];  Service: Gastroenterology;  Laterality: N/A;   KNEE SURGERY  2000   POLYPECTOMY  12/14/2019   Procedure: POLYPECTOMY;  Surgeon: Sherrilyn Rist, MD;  Location: [facility];  Service: Gastroenterology;;   TOTAL HIP ARTHROPLASTY Left 02/11/2019   Procedure: TOTAL HIP ARTHROPLASTY-POSTERIOR;  Surgeon: Ollen Gross, MD;  Location: [facility];  Service: Orthopedics;  Laterality: Left;     reports that he quit smoking about 30 years ago. His smoking use included cigarettes. He has never used smokeless tobacco. He reports current alcohol use. He reports that he does not use drugs. family history includes Cancer in his mother; Diabetes in his father and paternal grandmother; Heart disease in his father and maternal grandfather; Hypertension in his father; Obesity in his father and mother; Ovarian cancer in his sister; Stroke in his mother. No Known Allergies  Review of Systems  Constitutional:  Negative for chills and fever.  Neurological:  Positive for tingling, sensory change and headaches. Negative for dizziness, speech change, focal weakness and weakness.      Objective:     BP 122/60 (BP Location: Left Arm, Patient Position: Sitting, Cuff Size: Large)   Pulse 75   Temp 98.3 F (36.8 C) (Oral)   Ht 5\' 5"  (1.651 m)   Wt Marland Kitchen)  352 lb 8 oz (159.9 kg)   SpO2 96%   BMI 58.66 kg/m  BP Readings from Last 3 Encounters:  04/01/23 122/60  03/06/23 120/70  12/06/22 120/70   Wt Readings from Last 3 Encounters:  04/01/23 (!) 352 lb 8 oz (159.9 kg)  03/06/23 (!) 351 lb (159.2 kg)  12/06/22 (!) 353 lb (160.1 kg)      Physical Exam Vitals reviewed.  Constitutional:      General: He is not in acute distress.    Appearance: He is not toxic-appearing.  Eyes:     Extraocular Movements: Extraocular movements intact.     Pupils: Pupils are equal, round, and reactive to light.   Neck:     Comments: No carotid bruits Cardiovascular:     Rate and Rhythm: Normal rate and regular rhythm.  Pulmonary:     Effort: Pulmonary effort is normal.     Breath sounds: Normal breath sounds.  Neurological:     General: No focal deficit present.     Mental Status: He is alert.     Cranial Nerves: No cranial nerve deficit.     Comments: Cranial nerves II through XII intact.  No strength deficits on exam.  No ataxia.  Gait normal.  No sensory impairment to touch.  Psychiatric:        Mood and Affect: Mood normal. Mood is not anxious.      No results found for any visits on 04/01/23.  Last CBC Lab Results  Component Value Date   WBC 7.8 02/23/2022   HGB 14.2 02/23/2022   HCT 42.7 02/23/2022   MCV 88.8 02/23/2022   MCH 29.4 01/19/2020   RDW 14.7 02/23/2022   PLT 207.0 02/23/2022   Last metabolic panel Lab Results  Component Value Date   GLUCOSE 94 10/31/2022   NA 135 10/31/2022   K 3.7 10/31/2022   CL 96 10/31/2022   CO2 30 10/31/2022   BUN 14 10/31/2022   CREATININE 0.99 10/31/2022   GFR 78.06 10/31/2022   CALCIUM 9.7 10/31/2022   PROT 7.5 02/23/2022   ALBUMIN 4.1 02/23/2022   LABGLOB 2.4 09/22/2020   AGRATIO 1.8 09/22/2020   BILITOT 0.4 02/23/2022   ALKPHOS 49 02/23/2022   AST 22 02/23/2022   ALT 25 02/23/2022   ANIONGAP 11 02/13/2019   Last lipids Lab Results  Component Value Date   CHOL 177 02/23/2022   HDL 50.60 02/23/2022   LDLCALC 93 02/23/2022   TRIG 166.0 (H) 02/23/2022   CHOLHDL 3 02/23/2022   Last hemoglobin A1c Lab Results  Component Value Date   HGBA1C 6.0 10/31/2022      The 10-year ASCVD risk score (Arnett DK, et al., 2019) is: 16.9%    Assessment & Plan:   67 year old male with episode this past Friday of left upper and lower extremity paresthesias lasting about 1 hour.  No associated speech changes, focal facial weakness, or focal muscle weakness.  Nonfocal neuroexam at this time.  Carotid Dopplers a year ago  unremarkable.  Regular rhythm on exam.  ?TIA  He is also describing 78-month history of almost daily bifrontal headaches.  In view of his age 66 with daily persistent headache and new symptoms above left-sided numbness recommend MRI brain to further assess  Consider baby aspirin one daily.  Evelena Peat, MD

## 2023-04-01 NOTE — Telephone Encounter (Signed)
Patient arrived to appointment and was called back to treatment area.  Upon receiving patient subjective, he revealed that he has not been feeling well and he woke up one morning recently with full left side of body numbness.  Patient states that the numbness has improved, but he is still having the numbness over his left upper quadrant region to some degree.  Patient states that he has been having difficulty standing to care for his wife for prolonged periods.  Patient reports that he has not notified his MD yet, but has been checking his BP at home, and it has been okay.  This PT advised pt that it is not safe to perform PT session at this time and he needs to follow up with his MD (either PCP or referring MD) to notify of these new symptoms prior to next PT session.  Patient verbalizes his understanding and that he will reach out to PCP office first.

## 2023-04-01 NOTE — Patient Instructions (Signed)
I will be setting up MRI brain to further assess.  Follow up immediately for any recurrent sensory loss of weakness or slurred speech.

## 2023-04-08 ENCOUNTER — Ambulatory Visit: Payer: HMO | Attending: Neurosurgery | Admitting: Rehabilitative and Restorative Service Providers"

## 2023-04-08 ENCOUNTER — Encounter: Payer: Self-pay | Admitting: Rehabilitative and Restorative Service Providers"

## 2023-04-08 DIAGNOSIS — R252 Cramp and spasm: Secondary | ICD-10-CM | POA: Diagnosis not present

## 2023-04-08 DIAGNOSIS — R42 Dizziness and giddiness: Secondary | ICD-10-CM | POA: Insufficient documentation

## 2023-04-08 DIAGNOSIS — M6281 Muscle weakness (generalized): Secondary | ICD-10-CM | POA: Diagnosis not present

## 2023-04-08 DIAGNOSIS — M542 Cervicalgia: Secondary | ICD-10-CM | POA: Diagnosis not present

## 2023-04-08 DIAGNOSIS — R2689 Other abnormalities of gait and mobility: Secondary | ICD-10-CM | POA: Insufficient documentation

## 2023-04-08 NOTE — Therapy (Signed)
OUTPATIENT PHYSICAL THERAPY TREATMENT NOTE   Patient Name: James [name] MRN: [id number] DOB:03-11-1956, 67 y.o., male Today's Date: 04/08/2023  END OF SESSION:  PT End of Session - 04/08/23 0929     Visit Number 7    Date for PT Re-Evaluation 05/03/23    Authorization Type [employer]    Progress Note Due on Visit 10    PT Start Time 0925    PT Stop Time 1005    PT Time Calculation (min) 40 min    Activity Tolerance Patient tolerated treatment well    Behavior During Therapy [facility] for tasks assessed/performed             Past Medical History:  Diagnosis Date   Arthritis    Bilateral swelling of feet    Chicken pox    Complication of anesthesia    difficulty waking up   Depression    Hypertension    Joint pain    Obesity    Osteoarthritis    Prediabetes    Shortness of breath    Swelling    feet and legs   Vitamin D deficiency    Past Surgical History:  Procedure Laterality Date   cartilage removal     COLONOSCOPY WITH PROPOFOL N/A 12/14/2019   Procedure: COLONOSCOPY WITH PROPOFOL;  Surgeon: Sherrilyn Rist, MD;  Location: [facility];  Service: Gastroenterology;  Laterality: N/A;   KNEE SURGERY  2000   POLYPECTOMY  12/14/2019   Procedure: POLYPECTOMY;  Surgeon: Sherrilyn Rist, MD;  Location: [facility];  Service: Gastroenterology;;   TOTAL HIP ARTHROPLASTY Left 02/11/2019   Procedure: TOTAL HIP ARTHROPLASTY-POSTERIOR;  Surgeon: Ollen Gross, MD;  Location: [facility];  Service: Orthopedics;  Laterality: Left;    Patient Active Problem List   Diagnosis Date Noted   Depression 09/08/2020   Special screening for malignant neoplasms, colon    Benign neoplasm of rectum    OA (osteoarthritis) of hip 02/11/2019   Other hyperlipidemia 04/10/2017   Vitamin D deficiency 04/10/2017   Prediabetes 12/17/2016   Class 3 severe obesity with serious comorbidity and body mass index (BMI) of 50.0 to 59.9 in adult (HCC) 12/17/2016   At risk for  diabetes mellitus 10/02/2016   Hyperglycemia 09/04/2016   Severe obesity (BMI >= 40) (HCC) 01/15/2013   OBESITY, MORBID 01/20/2007   Essential hypertension 10/31/2006    PCP: Shirline Frees, NP  REFERRING PROVIDER: Julio Sicks, MD  REFERRING DIAG: [phone] (ICD-10-CM) - Spinal stenosis, cervical region  THERAPY DIAG:  Cervicalgia  Muscle weakness (generalized)  Cramp and spasm  Other abnormalities of gait and mobility  Dizziness and giddiness  Rationale for Evaluation and Treatment: Rehabilitation  ONSET DATE: June 2024 pain started getting worse  SUBJECTIVE:  SUBJECTIVE STATEMENT: Patient reports that he is still having some of the numbness, but he went to the MD and he will have a MRI of his brain next week.  Patient also states that he has a follow up coming up with Dr Jordan Likes.  Hand dominance: Right  PERTINENT HISTORY:  pre-diabetes, HTN, cervical radiculopathy, OSA, vertigo  PAIN:  Are you having pain? Yes: NPRS scale: 3/10 Pain location: cervical, scapular, shoulders Pain description: sharp, tingling into bilateral hands Aggravating factors: certain positions (especially with lying on his side) Relieving factors: certain supportive positions  PRECAUTIONS: None  RED FLAGS: None     WEIGHT BEARING RESTRICTIONS: No  FALLS:  Has patient fallen in last 6 months? No  LIVING ENVIRONMENT: Lives with: lives with their spouse lives with wife who is primarily w/c bound and needs assistance with most daily activities Lives in: House/apartment Stairs:  2 story home, but they live on one level.  They have a platform lift to help get to driveway. Has following equipment at home: Single point cane and Grab bars  OCCUPATION: Retired  PLOF: Independent  PATIENT GOALS: To  avoid cervical surgery and some pain relief.  NEXT MD VISIT:  Dr Jordan Likes on 04/11/2023  OBJECTIVE:  Note: Objective measures were completed at Evaluation unless otherwise noted.  DIAGNOSTIC FINDINGS:  Cervical MRI on 12/12/2022: IMPRESSION: 1. Degenerative disc bulge with uncovertebral and facet hypertrophy at C5-6 with resultant mild canal, with severe right worse than left C6 foraminal stenosis. Reactive endplate changes with marrow edema at this level could contribute to neck pain. 2. Mild left C7 foraminal narrowing related to disc bulge and uncovertebral disease. 3. Additional mild spondylosis elsewhere within the cervical spine as above without significant stenosis or impingement.  Lumbar MRI on 12/12/2022: IMPRESSION: 1. Mild spinal and bilateral lateral recess stenosis at L4-5. There is also a shallow broad-based left foraminal disc protrusion potentially irritating the left nerve root. 2. Mild bilateral foraminal encroachment at L3-4. 3. Advanced facet disease at L5-S1 but no spinal or foraminal stenosis.  PATIENT SURVEYS:  Eval:  NDI 32 / 50 = 64.0 %  COGNITION: Overall cognitive status: Within functional limits for tasks assessed  SENSATION: Pt reports tingling down bilateral UE  POSTURE: rounded shoulders, forward head, and flexed trunk   PALPATION: Tenderness to palpation with muscle spasms noted   CERVICAL ROM:   Active ROM A/ROM (deg) eval  Flexion 55  Extension 25  Right lateral flexion 30 with pain  Left lateral flexion 35  Right rotation 48  Left rotation 55   (Blank rows = not tested)  UPPER EXTREMITY ROM:  Eval:  WFL  UPPER EXTREMITY MMT:  Eval:   Right shoulder strength grossly 4-/5 Left is WFL  CERVICAL SPECIAL TESTS:  Eval:  Distraction test: decrease in pain  FUNCTIONAL TESTS:  Eval: 5 times sit to stand: 15.92 sec Timed up and go (TUG): 15.12 sec  TODAY'S TREATMENT:  DATE:  04/08/2023 Cervical SNAGs for rotation x10 bilat Cervical SNAGs for extension x10 Seated shoulder shoulder ER with red tband 2x10 Seated shoulder horizontal abduction with red tband 2x10 Seated shoulder D2 with red tband 2x10 Seated shoulder rows and extension with green tband x20 each bilat Seated shoulder flexion, scaption, and abduction with 1# 2x10 each bilat Seated shoulder punches with 2# 2x10 Seated upper trap stretch 2x20 sec bilat Seated blue pball rollout x10 Trigger Point Dry-Needling  Treatment instructions: Expect mild to moderate muscle soreness. S/S of pneumothorax if dry needled over a lung field, and to seek immediate medical attention should they occur. Patient verbalized understanding of these instructions and education. Patient Consent Given: Yes Education handout provided: Yes Muscles treated: bilateral upper traps and levators, bilat suboccipitals, bilat cervical multifidi Electrical stimulation performed: No Parameters: N/A Treatment response/outcome: Utilized skilled palpation to identify bony landmarks and trigger points.  Able to illicit twitch response and muscle elongation.  Soft tissue mobilization following to further promote tissue elongation.    03/27/2023 Nustep level 1 x4 min with PT present to discuss status Cervical SNAGs for rotation x10 bilat Cervical SNAGs for extension x10 Seated shoulder shoulder ER with red tband 2x10 Seated shoulder horizontal abduction with red tband 2x10 Seated shoulder D2 with red tband 2x10 Seated shoulder flexion, scaption, and abduction with 1# 2x10 each bilat Seated shoulder rows and extension with red tband x20 each bilat Seated shoulder punches with 2# 2x10 Seated blue pball rollout x15 Seated upper trap stretch 2x20 sec bilat   03/25/2023 Nustep level 1 x3 min with PT present to discuss status Cervical SNAGs for rotation x10 bilat Cervical SNAGs for  extension x10 Seated shoulder shoulder ER with red tband 2x10 Seated shoulder horizontal abduction with red tband 2x10 Seated shoulder flexion, scaption, and abduction with 1# 2x10 each bilat Seated shoulder punches with 1# 2x10 Seated shoulder rows and extension with red tband x20 each bilat Seated blue pball rollout x15 Ultrasound to cervical and upper trap regions:  3.3 Hz, 2.5 W/cm2, 50% duty cycle, x10 min     PATIENT EDUCATION:  Education details: Issued HEP Person educated: Patient Education method: Explanation, Facilities manager, and Handouts Education comprehension: verbalized understanding  HOME EXERCISE PROGRAM: Access Code: [email or web address] URL: [email or web address] Date: 03/07/2023 Prepared by: Reather Laurence  Exercises - Seated Scapular Retraction  - 1 x daily - 7 x weekly - 2 sets - 10 reps - Seated Cervical Retraction  - 1 x daily - 7 x weekly - 2 sets - 10 reps - Seated Assisted Cervical Rotation with Towel  - 1 x daily - 7 x weekly - 1-2 sets - 10 reps - Mid-Lower Cervical Extension SNAG with Strap  - 1 x daily - 7 x weekly - 1-2 sets - 10 reps  ASSESSMENT:  CLINICAL IMPRESSION: Mr Sosebee presents to PT reporting that he is still having headaches and numbness in his left side.  States that the numbness has improved since last week and he has a MRI of his brain scheduled next week.  Advised pt to notify Dr Jordan Likes of this at his next follow up visit later this week, he verbalized his understanding.  Patient with good response to dry needling, reporting no increase in pain during needling.  Patient able to progress to green theraband on some of his exercises.  Patient continues to require skilled PT to progress towards goal related activities.  OBJECTIVE IMPAIRMENTS: decreased mobility, decreased ROM, dizziness, impaired perceived functional ability, increased muscle spasms, impaired  UE functional use, postural dysfunction, and pain.   ACTIVITY LIMITATIONS:  carrying, lifting, sleeping, and transfers  PARTICIPATION LIMITATIONS: cleaning, laundry, driving, and community activity  PERSONAL FACTORS: Past/current experiences, Time since onset of injury/illness/exacerbation, and 3+ comorbidities: pre-diabetes, carpal tunnel, OA  are also affecting patient's functional outcome.   REHAB POTENTIAL: Good  CLINICAL DECISION MAKING: Evolving/moderate complexity  EVALUATION COMPLEXITY: Moderate   GOALS: Goals reviewed with patient? Yes  SHORT TERM GOALS: Target date: 03/22/2023  Pt will be independent with initial HEP. Baseline:  Goal status: Met on 03/18/23  2.  Patient will report at least a 25% improvement in symptoms since starting PT. Baseline:  Goal status: Met on 03/27/2023   LONG TERM GOALS: Target date: 05/03/2023  Patient will be independent with advanced HEP. Baseline:  Goal status: Ongoing  2.  Patient will improve Neck Disability Index to no greater than 50% to demonstrate improvements in functional tasks. Baseline: 64% Goal status: INITIAL  3.  Patient will increase cervical A/ROM to [place] without increased pain or dizziness to allow for improved ease with driving. Baseline:  Goal status: Ongoing  4.  Patient will increase right shoulder strength to at least 4/5 to allow him to be able to perform functional tasks with increased ease. Baseline:  Goal status: Ongoing  5.  Patient will report at least a 50% improvement in pain symptoms since starting PT. Baseline:  Goal status: Met on 03/27/2023 (following cervical injection)    PLAN:  PT FREQUENCY: 2x/week  PT DURATION: 8 weeks  PLANNED INTERVENTIONS: 97164- PT Re-evaluation, 97110-Therapeutic exercises, 97530- Therapeutic activity, 97112- Neuromuscular re-education, 97535- Self Care, 72536- Manual therapy, [id number]- Canalith repositioning, [id number]- Aquatic Therapy, 97014- Electrical stimulation (unattended), [id number]- Electrical stimulation (manual), [id number]- Ultrasound,  64403- Traction (mechanical), [id number]- Ionotophoresis 4mg /ml Dexamethasone, Patient/Family education, Balance training, Taping, Dry Needling, Joint mobilization, Joint manipulation, Spinal manipulation, Spinal mobilization, Cryotherapy, and Moist heat  PLAN FOR NEXT SESSION: Assess and progress HEP as indicated, strengthening, ROM, manual/dry needling as indicated, assess response to ultrasound.   Reather Laurence, PT, DPT 04/08/23, 10:12 AM  [facility] [address], [address] [place], [place] [address] Phone # [phone] Fax [phone]

## 2023-04-10 ENCOUNTER — Encounter: Payer: Self-pay | Admitting: Rehabilitative and Restorative Service Providers"

## 2023-04-10 ENCOUNTER — Ambulatory Visit: Payer: HMO | Admitting: Rehabilitative and Restorative Service Providers"

## 2023-04-10 DIAGNOSIS — R2689 Other abnormalities of gait and mobility: Secondary | ICD-10-CM

## 2023-04-10 DIAGNOSIS — R252 Cramp and spasm: Secondary | ICD-10-CM

## 2023-04-10 DIAGNOSIS — M542 Cervicalgia: Secondary | ICD-10-CM

## 2023-04-10 DIAGNOSIS — R42 Dizziness and giddiness: Secondary | ICD-10-CM

## 2023-04-10 DIAGNOSIS — M6281 Muscle weakness (generalized): Secondary | ICD-10-CM

## 2023-04-10 NOTE — Therapy (Signed)
OUTPATIENT PHYSICAL THERAPY TREATMENT NOTE   Patient Name: James SCHRAUTH MRN: [id number] DOB:January 22, 1956, 67 y.o., male Today's Date: 04/10/2023  END OF SESSION:  PT End of Session - 04/10/23 0803     Visit Number 8    Date for PT Re-Evaluation 05/03/23    Authorization Type [employer]    Progress Note Due on Visit 10    PT Start Time 0801    PT Stop Time 0840    PT Time Calculation (min) 39 min    Activity Tolerance Patient tolerated treatment well    Behavior During Therapy WFL for tasks assessed/performed             Past Medical History:  Diagnosis Date   Arthritis    Bilateral swelling of feet    Chicken pox    Complication of anesthesia    difficulty waking up   Depression    Hypertension    Joint pain    Obesity    Osteoarthritis    Prediabetes    Shortness of breath    Swelling    feet and legs   Vitamin D deficiency    Past Surgical History:  Procedure Laterality Date   cartilage removal     COLONOSCOPY WITH PROPOFOL N/A 12/14/2019   Procedure: COLONOSCOPY WITH PROPOFOL;  Surgeon: Sherrilyn Rist, MD;  Location: [facility];  Service: Gastroenterology;  Laterality: N/A;   KNEE SURGERY  2000   POLYPECTOMY  12/14/2019   Procedure: POLYPECTOMY;  Surgeon: Sherrilyn Rist, MD;  Location: [facility];  Service: Gastroenterology;;   TOTAL HIP ARTHROPLASTY Left 02/11/2019   Procedure: TOTAL HIP ARTHROPLASTY-POSTERIOR;  Surgeon: Ollen Gross, MD;  Location: [facility];  Service: Orthopedics;  Laterality: Left;    Patient Active Problem List   Diagnosis Date Noted   Depression 09/08/2020   Special screening for malignant neoplasms, colon    Benign neoplasm of rectum    OA (osteoarthritis) of hip 02/11/2019   Other hyperlipidemia 04/10/2017   Vitamin D deficiency 04/10/2017   Prediabetes 12/17/2016   Class 3 severe obesity with serious comorbidity and body mass index (BMI) of 50.0 to 59.9 in adult (HCC) 12/17/2016   At risk for  diabetes mellitus 10/02/2016   Hyperglycemia 09/04/2016   Severe obesity (BMI >= 40) (HCC) 01/15/2013   OBESITY, MORBID 01/20/2007   Essential hypertension 10/31/2006    PCP: Shirline Frees, NP  REFERRING PROVIDER: Julio Sicks, MD  REFERRING DIAG: [phone] (ICD-10-CM) - Spinal stenosis, cervical region  THERAPY DIAG:  Cervicalgia  Muscle weakness (generalized)  Cramp and spasm  Other abnormalities of gait and mobility  Dizziness and giddiness  Rationale for Evaluation and Treatment: Rehabilitation  ONSET DATE: June 2024 pain started getting worse  SUBJECTIVE:  SUBJECTIVE STATEMENT: Patient reports that he has his follow up with Dr Jordan Likes tomorrow.  Hand dominance: Right  PERTINENT HISTORY:  pre-diabetes, HTN, cervical radiculopathy, OSA, vertigo  PAIN:  Are you having pain? Yes: NPRS scale: 2/10 Pain location: cervical, scapular, shoulders Pain description: sharp, tingling into bilateral hands Aggravating factors: certain positions (especially with lying on his side) Relieving factors: certain supportive positions  PRECAUTIONS: None  RED FLAGS: None     WEIGHT BEARING RESTRICTIONS: No  FALLS:  Has patient fallen in last 6 months? No  LIVING ENVIRONMENT: Lives with: lives with their spouse lives with wife who is primarily w/c bound and needs assistance with most daily activities Lives in: House/apartment Stairs:  2 story home, but they live on one level.  They have a platform lift to help get to driveway. Has following equipment at home: Single point cane and Grab bars  OCCUPATION: Retired  PLOF: Independent  PATIENT GOALS: To avoid cervical surgery and some pain relief.  NEXT MD VISIT:  Dr Jordan Likes on 04/11/2023  OBJECTIVE:  Note: Objective measures were  completed at Evaluation unless otherwise noted.  DIAGNOSTIC FINDINGS:  Cervical MRI on 12/12/2022: IMPRESSION: 1. Degenerative disc bulge with uncovertebral and facet hypertrophy at C5-6 with resultant mild canal, with severe right worse than left C6 foraminal stenosis. Reactive endplate changes with marrow edema at this level could contribute to neck pain. 2. Mild left C7 foraminal narrowing related to disc bulge and uncovertebral disease. 3. Additional mild spondylosis elsewhere within the cervical spine as above without significant stenosis or impingement.  Lumbar MRI on 12/12/2022: IMPRESSION: 1. Mild spinal and bilateral lateral recess stenosis at L4-5. There is also a shallow broad-based left foraminal disc protrusion potentially irritating the left nerve root. 2. Mild bilateral foraminal encroachment at L3-4. 3. Advanced facet disease at L5-S1 but no spinal or foraminal stenosis.  PATIENT SURVEYS:  Eval:  NDI 32 / 50 = 64.0 %  COGNITION: Overall cognitive status: Within functional limits for tasks assessed  SENSATION: Pt reports tingling down bilateral UE  POSTURE: rounded shoulders, forward head, and flexed trunk   PALPATION: Tenderness to palpation with muscle spasms noted   CERVICAL ROM:   Active ROM A/ROM (deg) eval  Flexion 55  Extension 25  Right lateral flexion 30 with pain  Left lateral flexion 35  Right rotation 48  Left rotation 55   (Blank rows = not tested)  UPPER EXTREMITY ROM:  Eval:  WFL  UPPER EXTREMITY MMT:  Eval:   Right shoulder strength grossly 4-/5 Left is WFL  CERVICAL SPECIAL TESTS:  Eval:  Distraction test: decrease in pain  FUNCTIONAL TESTS:  Eval: 5 times sit to stand: 15.92 sec Timed up and go (TUG): 15.12 sec  TODAY'S TREATMENT:                                                                                                                              DATE:  04/10/2023  Cervical SNAGs for rotation x20 bilat Cervical SNAGs for  extension x20 Seated shoulder shoulder ER with red tband 2x10 Seated shoulder horizontal abduction with red tband 2x10 Seated shoulder D2 with red tband 2x10 Seated shoulder rows and extension with green tband x20 each bilat Seated shoulder punches with 2# 2x10 Seated upper trap stretch 2x20 sec bilat Ultrasound to cervical and upper trap regions:  3.3 Hz, 2.5 W/cm2, 50% duty cycle, x10 min   04/08/2023 Cervical SNAGs for rotation x10 bilat Cervical SNAGs for extension x10 Seated shoulder shoulder ER with red tband 2x10 Seated shoulder horizontal abduction with red tband 2x10 Seated shoulder D2 with red tband 2x10 Seated shoulder rows and extension with green tband x20 each bilat Seated shoulder flexion, scaption, and abduction with 1# 2x10 each bilat Seated shoulder punches with 2# 2x10 Seated upper trap stretch 2x20 sec bilat Seated blue pball rollout x10 Trigger Point Dry-Needling  Treatment instructions: Expect mild to moderate muscle soreness. S/S of pneumothorax if dry needled over a lung field, and to seek immediate medical attention should they occur. Patient verbalized understanding of these instructions and education. Patient Consent Given: Yes Education handout provided: Yes Muscles treated: bilateral upper traps and levators, bilat suboccipitals, bilat cervical multifidi Electrical stimulation performed: No Parameters: N/A Treatment response/outcome: Utilized skilled palpation to identify bony landmarks and trigger points.  Able to illicit twitch response and muscle elongation.  Soft tissue mobilization following to further promote tissue elongation.    03/27/2023 Nustep level 1 x4 min with PT present to discuss status Cervical SNAGs for rotation x10 bilat Cervical SNAGs for extension x10 Seated shoulder shoulder ER with red tband 2x10 Seated shoulder horizontal abduction with red tband 2x10 Seated shoulder D2 with red tband 2x10 Seated shoulder flexion, scaption, and  abduction with 1# 2x10 each bilat Seated shoulder rows and extension with red tband x20 each bilat Seated shoulder punches with 2# 2x10 Seated blue pball rollout x15 Seated upper trap stretch 2x20 sec bilat    PATIENT EDUCATION:  Education details: Issued HEP Person educated: Patient Education method: Explanation, Facilities manager, and Handouts Education comprehension: verbalized understanding  HOME EXERCISE PROGRAM: Access Code: [email or web address] URL: [email or web address] Date: 03/07/2023 Prepared by: Reather Laurence  Exercises - Seated Scapular Retraction  - 1 x daily - 7 x weekly - 2 sets - 10 reps - Seated Cervical Retraction  - 1 x daily - 7 x weekly - 2 sets - 10 reps - Seated Assisted Cervical Rotation with Towel  - 1 x daily - 7 x weekly - 1-2 sets - 10 reps - Mid-Lower Cervical Extension SNAG with Strap  - 1 x daily - 7 x weekly - 1-2 sets - 10 reps  ASSESSMENT:  CLINICAL IMPRESSION: Mr Mccamy presents to PT reporting that his cervical pain is a 2/10, that the tingling in his legs does seem better, but he is still having numbness.  Patient able to progress with exercises in clinic and able to increase in the weight utilized.  Will reassess ROM and NDI next week, as 10th visit is approaching.  Patient to have follow up appointment with Dr Jordan Likes tomorrow and he reports that he will notify him of the numbness and other symptoms that he has been experiencing.  OBJECTIVE IMPAIRMENTS: decreased mobility, decreased ROM, dizziness, impaired perceived functional ability, increased muscle spasms, impaired UE functional use, postural dysfunction, and pain.   ACTIVITY LIMITATIONS: carrying, lifting, sleeping, and transfers  PARTICIPATION LIMITATIONS: cleaning, laundry, driving, and community activity  PERSONAL FACTORS: Past/current experiences,  Time since onset of injury/illness/exacerbation, and 3+ comorbidities: pre-diabetes, carpal tunnel, OA  are also affecting patient's  functional outcome.   REHAB POTENTIAL: Good  CLINICAL DECISION MAKING: Evolving/moderate complexity  EVALUATION COMPLEXITY: Moderate   GOALS: Goals reviewed with patient? Yes  SHORT TERM GOALS: Target date: 03/22/2023  Pt will be independent with initial HEP. Baseline:  Goal status: Met on 03/18/23  2.  Patient will report at least a 25% improvement in symptoms since starting PT. Baseline:  Goal status: Met on 03/27/2023   LONG TERM GOALS: Target date: 05/03/2023  Patient will be independent with advanced HEP. Baseline:  Goal status: Ongoing  2.  Patient will improve Neck Disability Index to no greater than 50% to demonstrate improvements in functional tasks. Baseline: 64% Goal status: INITIAL  3.  Patient will increase cervical A/ROM to [facility] without increased pain or dizziness to allow for improved ease with driving. Baseline:  Goal status: Ongoing  4.  Patient will increase right shoulder strength to at least 4/5 to allow him to be able to perform functional tasks with increased ease. Baseline:  Goal status: Ongoing  5.  Patient will report at least a 50% improvement in pain symptoms since starting PT. Baseline:  Goal status: Met on 03/27/2023 (following cervical injection)    PLAN:  PT FREQUENCY: 2x/week  PT DURATION: 8 weeks  PLANNED INTERVENTIONS: 97164- PT Re-evaluation, 97110-Therapeutic exercises, 97530- Therapeutic activity, 97112- Neuromuscular re-education, 97535- Self Care, 46962- Manual therapy, [phone]- Canalith repositioning, [id number]- Aquatic Therapy, 97014- Electrical stimulation (unattended), [id number]- Electrical stimulation (manual), [id number]- Ultrasound, 13244- Traction (mechanical), [id number]- Ionotophoresis 4mg /ml Dexamethasone, Patient/Family education, Balance training, Taping, Dry Needling, Joint mobilization, Joint manipulation, Spinal manipulation, Spinal mobilization, Cryotherapy, and Moist heat  PLAN FOR NEXT SESSION: Assess and progress HEP as  indicated, strengthening, ROM, manual/dry needling as indicated, assess response to ultrasound, NDI, reassess cervical ROM   Reather Laurence, PT, DPT 04/10/23, 8:59 AM  [facility] [address] [place], [place] [address] Phone # [phone] Fax [phone]

## 2023-04-11 DIAGNOSIS — M4802 Spinal stenosis, cervical region: Secondary | ICD-10-CM | POA: Diagnosis not present

## 2023-04-15 ENCOUNTER — Encounter: Payer: Self-pay | Admitting: Rehabilitative and Restorative Service Providers"

## 2023-04-15 ENCOUNTER — Ambulatory Visit: Payer: HMO | Admitting: Rehabilitative and Restorative Service Providers"

## 2023-04-15 DIAGNOSIS — M6281 Muscle weakness (generalized): Secondary | ICD-10-CM

## 2023-04-15 DIAGNOSIS — R252 Cramp and spasm: Secondary | ICD-10-CM

## 2023-04-15 DIAGNOSIS — M542 Cervicalgia: Secondary | ICD-10-CM

## 2023-04-15 DIAGNOSIS — R2689 Other abnormalities of gait and mobility: Secondary | ICD-10-CM

## 2023-04-15 DIAGNOSIS — R42 Dizziness and giddiness: Secondary | ICD-10-CM

## 2023-04-15 NOTE — Therapy (Signed)
OUTPATIENT PHYSICAL THERAPY TREATMENT NOTE   Patient Name: James [name] MRN: [id number] DOB:June 06, 1955, 67 y.o., male Today's Date: 04/15/2023   Progress Note Reporting Period 03/07/2023 to 04/15/2023  See note below for Objective Data and Assessment of Progress/Goals.       END OF SESSION:  PT End of Session - 04/15/23 0801     Visit Number 9    Date for PT Re-Evaluation 05/03/23    Authorization Type [employer]    Progress Note Due on Visit 10    PT Start Time 0758    PT Stop Time 0840    PT Time Calculation (min) 42 min    Activity Tolerance Patient tolerated treatment well    Behavior During Therapy WFL for tasks assessed/performed             Past Medical History:  Diagnosis Date   Arthritis    Bilateral swelling of feet    Chicken pox    Complication of anesthesia    difficulty waking up   Depression    Hypertension    Joint pain    Obesity    Osteoarthritis    Prediabetes    Shortness of breath    Swelling    feet and legs   Vitamin D deficiency    Past Surgical History:  Procedure Laterality Date   cartilage removal     COLONOSCOPY WITH PROPOFOL N/A 12/14/2019   Procedure: COLONOSCOPY WITH PROPOFOL;  Surgeon: Sherrilyn Rist, MD;  Location: [facility];  Service: Gastroenterology;  Laterality: N/A;   KNEE SURGERY  2000   POLYPECTOMY  12/14/2019   Procedure: POLYPECTOMY;  Surgeon: Sherrilyn Rist, MD;  Location: [facility];  Service: Gastroenterology;;   TOTAL HIP ARTHROPLASTY Left 02/11/2019   Procedure: TOTAL HIP ARTHROPLASTY-POSTERIOR;  Surgeon: Ollen Gross, MD;  Location: [facility];  Service: Orthopedics;  Laterality: Left;    Patient Active Problem List   Diagnosis Date Noted   Depression 09/08/2020   Special screening for malignant neoplasms, colon    Benign neoplasm of rectum    OA (osteoarthritis) of hip 02/11/2019   Other hyperlipidemia 04/10/2017   Vitamin D deficiency 04/10/2017   Prediabetes 12/17/2016    Class 3 severe obesity with serious comorbidity and body mass index (BMI) of 50.0 to 59.9 in adult (HCC) 12/17/2016   At risk for diabetes mellitus 10/02/2016   Hyperglycemia 09/04/2016   Severe obesity (BMI >= 40) (HCC) 01/15/2013   OBESITY, MORBID 01/20/2007   Essential hypertension 10/31/2006    PCP: Shirline Frees, NP  REFERRING PROVIDER: Julio Sicks, MD  REFERRING DIAG: [phone] (ICD-10-CM) - Spinal stenosis, cervical region  THERAPY DIAG:  Cervicalgia  Muscle weakness (generalized)  Cramp and spasm  Other abnormalities of gait and mobility  Dizziness and giddiness  Rationale for Evaluation and Treatment: Rehabilitation  ONSET DATE: June 2024 pain started getting worse  SUBJECTIVE:  SUBJECTIVE STATEMENT: Patient reports that Dr Jordan Likes is thinking that due to patients new symptoms, he may require surgical intervention.  Pt is awaiting results from brain MRI prior to making any decisions.  Hand dominance: Right  PERTINENT HISTORY:  pre-diabetes, HTN, cervical radiculopathy, OSA, vertigo  PAIN:  Are you having pain? Yes: NPRS scale: 2/10 Pain location: cervical, scapular, shoulders Pain description: sharp, tingling into bilateral hands Aggravating factors: certain positions (especially with lying on his side) Relieving factors: certain supportive positions  PRECAUTIONS: None  RED FLAGS: None     WEIGHT BEARING RESTRICTIONS: No  FALLS:  Has patient fallen in last 6 months? No  LIVING ENVIRONMENT: Lives with: lives with their spouse lives with wife who is primarily w/c bound and needs assistance with most daily activities Lives in: House/apartment Stairs:  2 story home, but they live on one level.  They have a platform lift to help get to driveway. Has following  equipment at home: Single point cane and Grab bars  OCCUPATION: Retired  PLOF: Independent  PATIENT GOALS: To avoid cervical surgery and some pain relief.  NEXT MD VISIT:  Dr Jordan Likes on 04/11/2023  OBJECTIVE:  Note: Objective measures were completed at Evaluation unless otherwise noted.  DIAGNOSTIC FINDINGS:  Cervical MRI on 12/12/2022: IMPRESSION: 1. Degenerative disc bulge with uncovertebral and facet hypertrophy at C5-6 with resultant mild canal, with severe right worse than left C6 foraminal stenosis. Reactive endplate changes with marrow edema at this level could contribute to neck pain. 2. Mild left C7 foraminal narrowing related to disc bulge and uncovertebral disease. 3. Additional mild spondylosis elsewhere within the cervical spine as above without significant stenosis or impingement.  Lumbar MRI on 12/12/2022: IMPRESSION: 1. Mild spinal and bilateral lateral recess stenosis at L4-5. There is also a shallow broad-based left foraminal disc protrusion potentially irritating the left nerve root. 2. Mild bilateral foraminal encroachment at L3-4. 3. Advanced facet disease at L5-S1 but no spinal or foraminal stenosis.  PATIENT SURVEYS:  Eval:  NDI 32 / 50 = 64.0 % 04/15/2023:  Neck Disability Index score: 32 / 50 = 64.0 %  COGNITION: Overall cognitive status: Within functional limits for tasks assessed  SENSATION: Pt reports tingling down bilateral UE  POSTURE: rounded shoulders, forward head, and flexed trunk   PALPATION: Tenderness to palpation with muscle spasms noted   CERVICAL ROM:   Active ROM A/ROM (deg) eval A/ROM (Deg) 04/15/23  Flexion 55 45  Extension 25 27  Right lateral flexion 30 with pain 35  Left lateral flexion 35 35  Right rotation 48 55  Left rotation 55 55   (Blank rows = not tested)  UPPER EXTREMITY ROM:  Eval:  WFL  UPPER EXTREMITY MMT:  Eval:   Right shoulder strength grossly 4-/5 Left is WFL  CERVICAL SPECIAL TESTS:  Eval:   Distraction test: decrease in pain  FUNCTIONAL TESTS:  Eval: 5 times sit to stand: 15.92 sec Timed up and go (TUG): 15.12 sec  TODAY'S TREATMENT:  DATE:  04/15/2023 Nustep level 1 x3 min with PT present to discuss status Cervical SNAGs for rotation x20 bilat Cervical SNAGs for extension x20 Neck Disability Index score: 32 / 50 = 64.0 % Seated shoulder horizontal abduction with red tband 2x10 Seated shoulder shoulder ER with red tband 2x10 Cervical A/ROM Seated shoulder D2 with red tband 2x10 Seated shoulder punches with 2# 2x10 Seated shoulder flexion, scaption, and abduction with 2# 2x10 each bilat Seated shoulder rows and extension with green tband x20 each bilat   04/10/2023 Cervical SNAGs for rotation x20 bilat Cervical SNAGs for extension x20 Seated shoulder shoulder ER with red tband 2x10 Seated shoulder horizontal abduction with red tband 2x10 Seated shoulder D2 with red tband 2x10 Seated shoulder rows and extension with green tband x20 each bilat Seated shoulder punches with 2# 2x10 Seated upper trap stretch 2x20 sec bilat Ultrasound to cervical and upper trap regions:  3.3 Hz, 2.5 W/cm2, 50% duty cycle, x10 min   04/08/2023 Cervical SNAGs for rotation x10 bilat Cervical SNAGs for extension x10 Seated shoulder shoulder ER with red tband 2x10 Seated shoulder horizontal abduction with red tband 2x10 Seated shoulder D2 with red tband 2x10 Seated shoulder rows and extension with green tband x20 each bilat Seated shoulder flexion, scaption, and abduction with 1# 2x10 each bilat Seated shoulder punches with 2# 2x10 Seated upper trap stretch 2x20 sec bilat Seated blue pball rollout x10 Trigger Point Dry-Needling  Treatment instructions: Expect mild to moderate muscle soreness. S/S of pneumothorax if dry needled over a lung field, and to seek  immediate medical attention should they occur. Patient verbalized understanding of these instructions and education. Patient Consent Given: Yes Education handout provided: Yes Muscles treated: bilateral upper traps and levators, bilat suboccipitals, bilat cervical multifidi Electrical stimulation performed: No Parameters: N/A Treatment response/outcome: Utilized skilled palpation to identify bony landmarks and trigger points.  Able to illicit twitch response and muscle elongation.  Soft tissue mobilization following to further promote tissue elongation.      PATIENT EDUCATION:  Education details: Issued HEP Person educated: Patient Education method: Explanation, Facilities manager, and Handouts Education comprehension: verbalized understanding  HOME EXERCISE PROGRAM: Access Code: [email or web address] URL: [email or web address] Date: 03/07/2023 Prepared by: Reather Laurence  Exercises - Seated Scapular Retraction  - 1 x daily - 7 x weekly - 2 sets - 10 reps - Seated Cervical Retraction  - 1 x daily - 7 x weekly - 2 sets - 10 reps - Seated Assisted Cervical Rotation with Towel  - 1 x daily - 7 x weekly - 1-2 sets - 10 reps - Mid-Lower Cervical Extension SNAG with Strap  - 1 x daily - 7 x weekly - 1-2 sets - 10 reps  ASSESSMENT:  CLINICAL IMPRESSION: Mr Spiegler presents to PT reporting that Dr Jordan Likes wants to review the brain MRI first, but he told patient that he may require surgery for his cervical spine.  Patient continues to progress with session and therex throughout with minimal recovery periods required.  Patient with similar score on NDI and minimal changes noted on cervical A/ROM since initial evaluation.  Patient continues to require skilled PT to progress towards goal related activities.  OBJECTIVE IMPAIRMENTS: decreased mobility, decreased ROM, dizziness, impaired perceived functional ability, increased muscle spasms, impaired UE functional use, postural dysfunction, and pain.    ACTIVITY LIMITATIONS: carrying, lifting, sleeping, and transfers  PARTICIPATION LIMITATIONS: cleaning, laundry, driving, and community activity  PERSONAL FACTORS: Past/current experiences, Time since onset of injury/illness/exacerbation, and 3+ comorbidities: pre-diabetes, carpal  tunnel, OA  are also affecting patient's functional outcome.   REHAB POTENTIAL: Good  CLINICAL DECISION MAKING: Evolving/moderate complexity  EVALUATION COMPLEXITY: Moderate   GOALS: Goals reviewed with patient? Yes  SHORT TERM GOALS: Target date: 03/22/2023  Pt will be independent with initial HEP. Baseline:  Goal status: Met on 03/18/23  2.  Patient will report at least a 25% improvement in symptoms since starting PT. Baseline:  Goal status: Met on 03/27/2023   LONG TERM GOALS: Target date: 05/03/2023  Patient will be independent with advanced HEP. Baseline:  Goal status: Ongoing  2.  Patient will improve Neck Disability Index to no greater than 50% to demonstrate improvements in functional tasks. Baseline: 64% Goal status: INITIAL  3.  Patient will increase cervical A/ROM to [facility] without increased pain or dizziness to allow for improved ease with driving. Baseline:  Goal status: Ongoing  4.  Patient will increase right shoulder strength to at least 4/5 to allow him to be able to perform functional tasks with increased ease. Baseline:  Goal status: Ongoing  5.  Patient will report at least a 50% improvement in pain symptoms since starting PT. Baseline:  Goal status: Met on 03/27/2023 (following cervical injection)    PLAN:  PT FREQUENCY: 2x/week  PT DURATION: 8 weeks  PLANNED INTERVENTIONS: 97164- PT Re-evaluation, 97110-Therapeutic exercises, 97530- Therapeutic activity, 97112- Neuromuscular re-education, 97535- Self Care, 78469- Manual therapy, [phone]- Canalith repositioning, [id number]- Aquatic Therapy, 97014- Electrical stimulation (unattended), [id number]- Electrical stimulation  (manual), [id number]- Ultrasound, 84132- Traction (mechanical), [id number]- Ionotophoresis 4mg /ml Dexamethasone, Patient/Family education, Balance training, Taping, Dry Needling, Joint mobilization, Joint manipulation, Spinal manipulation, Spinal mobilization, Cryotherapy, and Moist heat  PLAN FOR NEXT SESSION: Assess and progress HEP as indicated, strengthening, ROM, manual/dry needling as indicated  Reather Laurence, PT, DPT 04/15/23, 11:14 AM  [facility] [address], [address] [place], [place] [address] Phone # [phone] Fax [phone]

## 2023-04-17 ENCOUNTER — Ambulatory Visit: Payer: HMO | Admitting: Rehabilitative and Restorative Service Providers"

## 2023-04-17 ENCOUNTER — Encounter: Payer: Self-pay | Admitting: Rehabilitative and Restorative Service Providers"

## 2023-04-17 DIAGNOSIS — R252 Cramp and spasm: Secondary | ICD-10-CM

## 2023-04-17 DIAGNOSIS — M542 Cervicalgia: Secondary | ICD-10-CM

## 2023-04-17 DIAGNOSIS — M6281 Muscle weakness (generalized): Secondary | ICD-10-CM

## 2023-04-17 DIAGNOSIS — R2689 Other abnormalities of gait and mobility: Secondary | ICD-10-CM

## 2023-04-17 NOTE — Therapy (Signed)
OUTPATIENT PHYSICAL THERAPY TREATMENT NOTE   Patient Name: James [name] MRN: [id number] DOB:[birth year], 67 y.o., male Today's Date: 04/17/2023    END OF SESSION:  PT End of Session - 04/17/23 0802     Visit Number 10    Date for PT Re-Evaluation 05/03/23    Authorization Type [employer]    Progress Note Due on Visit 19    PT Start Time 0800    PT Stop Time 0840    PT Time Calculation (min) 40 min    Activity Tolerance Patient tolerated treatment well    Behavior During Therapy WFL for tasks assessed/performed             Past Medical History:  Diagnosis Date   Arthritis    Bilateral swelling of feet    Chicken pox    Complication of anesthesia    difficulty waking up   Depression    Hypertension    Joint pain    Obesity    Osteoarthritis    Prediabetes    Shortness of breath    Swelling    feet and legs   Vitamin D deficiency    Past Surgical History:  Procedure Laterality Date   cartilage removal     COLONOSCOPY WITH PROPOFOL N/A 12/14/2019   Procedure: COLONOSCOPY WITH PROPOFOL;  Surgeon: Sherrilyn Rist, MD;  Location: [facility];  Service: Gastroenterology;  Laterality: N/A;   KNEE SURGERY  2000   POLYPECTOMY  12/14/2019   Procedure: POLYPECTOMY;  Surgeon: Sherrilyn Rist, MD;  Location: [facility];  Service: Gastroenterology;;   TOTAL HIP ARTHROPLASTY Left 02/11/2019   Procedure: TOTAL HIP ARTHROPLASTY-POSTERIOR;  Surgeon: Ollen Gross, MD;  Location: [facility];  Service: Orthopedics;  Laterality: Left;    Patient Active Problem List   Diagnosis Date Noted   Depression 09/08/2020   Special screening for malignant neoplasms, colon    Benign neoplasm of rectum    OA (osteoarthritis) of hip 02/11/2019   Other hyperlipidemia 04/10/2017   Vitamin D deficiency 04/10/2017   Prediabetes 12/17/2016   Class 3 severe obesity with serious comorbidity and body mass index (BMI) of 50.0 to 59.9 in adult (HCC) 12/17/2016   At risk for  diabetes mellitus 10/02/2016   Hyperglycemia 09/04/2016   Severe obesity (BMI >= 40) (HCC) 01/15/2013   OBESITY, MORBID 01/20/2007   Essential hypertension 10/31/2006    PCP: Shirline Frees, NP  REFERRING PROVIDER: Julio Sicks, MD  REFERRING DIAG: [phone] (ICD-10-CM) - Spinal stenosis, cervical region  THERAPY DIAG:  Cervicalgia  Muscle weakness (generalized)  Cramp and spasm  Other abnormalities of gait and mobility  Rationale for Evaluation and Treatment: Rehabilitation  ONSET DATE: June 2024 pain started getting worse  SUBJECTIVE:  SUBJECTIVE STATEMENT: Patient reports that he is having some increased pain this morning.  States that he goes for his brain MRI tomorrow.  Hand dominance: Right  PERTINENT HISTORY:  pre-diabetes, HTN, cervical radiculopathy, OSA, vertigo  PAIN:  Are you having pain? Yes: NPRS scale: 4/10 Pain location: cervical, scapular, shoulders Pain description: sharp, tingling into bilateral hands Aggravating factors: certain positions (especially with lying on his side) Relieving factors: certain supportive positions  PRECAUTIONS: None  RED FLAGS: None     WEIGHT BEARING RESTRICTIONS: No  FALLS:  Has patient fallen in last 6 months? No  LIVING ENVIRONMENT: Lives with: lives with their spouse lives with wife who is primarily w/c bound and needs assistance with most daily activities Lives in: House/apartment Stairs:  2 story home, but they live on one level.  They have a platform lift to help get to driveway. Has following equipment at home: Single point cane and Grab bars  OCCUPATION: Retired  PLOF: Independent  PATIENT GOALS: To avoid cervical surgery and some pain relief.  NEXT MD VISIT:  Dr Jordan Likes on 04/11/2023  OBJECTIVE:  Note:  Objective measures were completed at Evaluation unless otherwise noted.  DIAGNOSTIC FINDINGS:  Cervical MRI on 12/12/2022: IMPRESSION: 1. Degenerative disc bulge with uncovertebral and facet hypertrophy at C5-6 with resultant mild canal, with severe right worse than left C6 foraminal stenosis. Reactive endplate changes with marrow edema at this level could contribute to neck pain. 2. Mild left C7 foraminal narrowing related to disc bulge and uncovertebral disease. 3. Additional mild spondylosis elsewhere within the cervical spine as above without significant stenosis or impingement.  Lumbar MRI on 12/12/2022: IMPRESSION: 1. Mild spinal and bilateral lateral recess stenosis at L4-5. There is also a shallow broad-based left foraminal disc protrusion potentially irritating the left nerve root. 2. Mild bilateral foraminal encroachment at L3-4. 3. Advanced facet disease at L5-S1 but no spinal or foraminal stenosis.  PATIENT SURVEYS:  Eval:  NDI 32 / 50 = 64.0 % 04/15/2023:  Neck Disability Index score: 32 / 50 = 64.0 %  COGNITION: Overall cognitive status: Within functional limits for tasks assessed  SENSATION: Pt reports tingling down bilateral UE  POSTURE: rounded shoulders, forward head, and flexed trunk   PALPATION: Tenderness to palpation with muscle spasms noted   CERVICAL ROM:   Active ROM A/ROM (deg) eval A/ROM (Deg) 04/15/23  Flexion 55 45  Extension 25 27  Right lateral flexion 30 with pain 35  Left lateral flexion 35 35  Right rotation 48 55  Left rotation 55 55   (Blank rows = not tested)  UPPER EXTREMITY ROM:  Eval:  WFL  UPPER EXTREMITY MMT:  Eval:   Right shoulder strength grossly 4-/5 Left is WFL  CERVICAL SPECIAL TESTS:  Eval:  Distraction test: decrease in pain  FUNCTIONAL TESTS:  Eval: 5 times sit to stand: 15.92 sec Timed up and go (TUG): 15.12 sec  TODAY'S TREATMENT:  DATE:  04/17/2023 Nustep level 1 x3 min with PT present to discuss status Cervical SNAGs for rotation x20 bilat Cervical SNAGs for extension x20 Seated shoulder horizontal abduction with red tband 2x10 Seated shoulder shoulder ER with red tband 2x10 Seated shoulder rows and extension with green tband x20 each bilat Seated shoulder flexion, scaption, and abduction with 2# 2x10 each bilat Manual Therapy:  soft tissue mobilization to bilateral cervical paraspinals and upper traps to promote decreased trigger pints and tissue elongation.   04/15/2023 Nustep level 1 x3 min with PT present to discuss status Cervical SNAGs for rotation x20 bilat Cervical SNAGs for extension x20 Neck Disability Index score: 32 / 50 = 64.0 % Seated shoulder horizontal abduction with red tband 2x10 Seated shoulder shoulder ER with red tband 2x10 Cervical A/ROM Seated shoulder D2 with red tband 2x10 Seated shoulder punches with 2# 2x10 Seated shoulder flexion, scaption, and abduction with 2# 2x10 each bilat Seated shoulder rows and extension with green tband x20 each bilat   04/10/2023 Cervical SNAGs for rotation x20 bilat Cervical SNAGs for extension x20 Seated shoulder shoulder ER with red tband 2x10 Seated shoulder horizontal abduction with red tband 2x10 Seated shoulder D2 with red tband 2x10 Seated shoulder rows and extension with green tband x20 each bilat Seated shoulder punches with 2# 2x10 Seated upper trap stretch 2x20 sec bilat Ultrasound to cervical and upper trap regions:  3.3 Hz, 2.5 W/cm2, 50% duty cycle, x10 min      PATIENT EDUCATION:  Education details: Issued HEP Person educated: Patient Education method: Explanation, Facilities manager, and Handouts Education comprehension: verbalized understanding  HOME EXERCISE PROGRAM: Access Code: [email or web address] URL: [email or web address] Date: 03/07/2023 Prepared by: Reather Laurence  Exercises - Seated Scapular Retraction  - 1 x daily - 7 x weekly - 2 sets - 10 reps - Seated Cervical Retraction  - 1 x daily - 7 x weekly - 2 sets - 10 reps - Seated Assisted Cervical Rotation with Towel  - 1 x daily - 7 x weekly - 1-2 sets - 10 reps - Mid-Lower Cervical Extension SNAG with Strap  - 1 x daily - 7 x weekly - 1-2 sets - 10 reps  ASSESSMENT:  CLINICAL IMPRESSION: Mr Bosscher presents to PT reporting pain of 4/10 this morning.  Patient able to progress with strengthening exercises with no reports of increased pain.  Patient overall continues to have pain with ROM, especially at end range.  Patient continues to have increased tightness of his muscles.  Following manual soft tissue mobilization, patient reported feeling looser and decreased pain of 2/10.   OBJECTIVE IMPAIRMENTS: decreased mobility, decreased ROM, dizziness, impaired perceived functional ability, increased muscle spasms, impaired UE functional use, postural dysfunction, and pain.   ACTIVITY LIMITATIONS: carrying, lifting, sleeping, and transfers  PARTICIPATION LIMITATIONS: cleaning, laundry, driving, and community activity  PERSONAL FACTORS: Past/current experiences, Time since onset of injury/illness/exacerbation, and 3+ comorbidities: pre-diabetes, carpal tunnel, OA  are also affecting patient's functional outcome.   REHAB POTENTIAL: Good  CLINICAL DECISION MAKING: Evolving/moderate complexity  EVALUATION COMPLEXITY: Moderate   GOALS: Goals reviewed with patient? Yes  SHORT TERM GOALS: Target date: 03/22/2023  Pt will be independent with initial HEP. Baseline:  Goal status: Met on 03/18/23  2.  Patient will report at least a 25% improvement in symptoms since starting PT. Baseline:  Goal status: Met on 03/27/2023   LONG TERM GOALS: Target date: 05/03/2023  Patient will be independent with advanced HEP. Baseline:  Goal status: Ongoing  2.  Patient will improve Neck Disability Index  to no greater than 50% to demonstrate improvements in functional tasks. Baseline: 64% Goal status: Ongoing  3.  Patient will increase cervical A/ROM to [facility] without increased pain or dizziness to allow for improved ease with driving. Baseline:  Goal status: Ongoing (see above)  4.  Patient will increase right shoulder strength to at least 4/5 to allow him to be able to perform functional tasks with increased ease. Baseline:  Goal status: Ongoing  5.  Patient will report at least a 50% improvement in pain symptoms since starting PT. Baseline:  Goal status: Met on 03/27/2023 (following cervical injection)    PLAN:  PT FREQUENCY: 2x/week  PT DURATION: 8 weeks  PLANNED INTERVENTIONS: 97164- PT Re-evaluation, 97110-Therapeutic exercises, 97530- Therapeutic activity, 97112- Neuromuscular re-education, 97535- Self Care, 29518- Manual therapy, [id number]- Canalith repositioning, [id number]- Aquatic Therapy, 97014- Electrical stimulation (unattended), [id number]- Electrical stimulation (manual), [id number]- Ultrasound, 06301- Traction (mechanical), [id number]- Ionotophoresis 4mg /ml Dexamethasone, Patient/Family education, Balance training, Taping, Dry Needling, Joint mobilization, Joint manipulation, Spinal manipulation, Spinal mobilization, Cryotherapy, and Moist heat  PLAN FOR NEXT SESSION: Assess and progress HEP as indicated, strengthening, ROM, manual/dry needling as indicated   Reather Laurence, PT, DPT 04/17/23, 8:45 AM  [facility] [address], [address] [place], [place] [address] Phone # [phone] Fax [phone]

## 2023-04-18 ENCOUNTER — Ambulatory Visit
Admission: RE | Admit: 2023-04-18 | Discharge: 2023-04-18 | Disposition: A | Discharge status: Home / Self Care | Payer: HMO | Source: Ambulatory Visit | Attending: Family Medicine | Admitting: Family Medicine

## 2023-04-18 ENCOUNTER — Encounter: Payer: Self-pay | Admitting: Adult Health

## 2023-04-18 DIAGNOSIS — G4452 New daily persistent headache (NDPH): Secondary | ICD-10-CM

## 2023-04-18 DIAGNOSIS — I6782 Cerebral ischemia: Secondary | ICD-10-CM | POA: Diagnosis not present

## 2023-04-18 DIAGNOSIS — R519 Headache, unspecified: Secondary | ICD-10-CM | POA: Diagnosis not present

## 2023-04-18 DIAGNOSIS — R202 Paresthesia of skin: Secondary | ICD-10-CM

## 2023-04-18 DIAGNOSIS — G319 Degenerative disease of nervous system, unspecified: Secondary | ICD-10-CM | POA: Diagnosis not present

## 2023-04-18 NOTE — Telephone Encounter (Signed)
FYI

## 2023-04-22 ENCOUNTER — Encounter: Payer: Self-pay | Admitting: Rehabilitative and Restorative Service Providers"

## 2023-04-22 ENCOUNTER — Ambulatory Visit: Payer: HMO | Admitting: Rehabilitative and Restorative Service Providers"

## 2023-04-22 DIAGNOSIS — M542 Cervicalgia: Secondary | ICD-10-CM

## 2023-04-22 DIAGNOSIS — R252 Cramp and spasm: Secondary | ICD-10-CM

## 2023-04-22 DIAGNOSIS — M6281 Muscle weakness (generalized): Secondary | ICD-10-CM

## 2023-04-22 DIAGNOSIS — R42 Dizziness and giddiness: Secondary | ICD-10-CM

## 2023-04-22 DIAGNOSIS — R2689 Other abnormalities of gait and mobility: Secondary | ICD-10-CM

## 2023-04-22 NOTE — Therapy (Signed)
OUTPATIENT PHYSICAL THERAPY TREATMENT NOTE   Patient Name: James [name] MRN: [id number] DOB:06-25-1955, 67 y.o., male Today's Date: 04/22/2023    END OF SESSION:  PT End of Session - 04/22/23 0810     Visit Number 11    Date for PT Re-Evaluation 05/03/23    Authorization Type [employer]    Progress Note Due on Visit 19    PT Start Time 0802    PT Stop Time 0858    PT Time Calculation (min) 56 min    Activity Tolerance Patient tolerated treatment well    Behavior During Therapy [facility] for tasks assessed/performed             Past Medical History:  Diagnosis Date   Arthritis    Bilateral swelling of feet    Chicken pox    Complication of anesthesia    difficulty waking up   Depression    Hypertension    Joint pain    Obesity    Osteoarthritis    Prediabetes    Shortness of breath    Swelling    feet and legs   Vitamin D deficiency    Past Surgical History:  Procedure Laterality Date   cartilage removal     COLONOSCOPY WITH PROPOFOL N/A 12/14/2019   Procedure: COLONOSCOPY WITH PROPOFOL;  Surgeon: Sherrilyn Rist, MD;  Location: [facility];  Service: Gastroenterology;  Laterality: N/A;   KNEE SURGERY  2000   POLYPECTOMY  12/14/2019   Procedure: POLYPECTOMY;  Surgeon: Sherrilyn Rist, MD;  Location: [facility];  Service: Gastroenterology;;   TOTAL HIP ARTHROPLASTY Left 02/11/2019   Procedure: TOTAL HIP ARTHROPLASTY-POSTERIOR;  Surgeon: Ollen Gross, MD;  Location: [facility];  Service: Orthopedics;  Laterality: Left;    Patient Active Problem List   Diagnosis Date Noted   Depression 09/08/2020   Special screening for malignant neoplasms, colon    Benign neoplasm of rectum    OA (osteoarthritis) of hip 02/11/2019   Other hyperlipidemia 04/10/2017   Vitamin D deficiency 04/10/2017   Prediabetes 12/17/2016   Class 3 severe obesity with serious comorbidity and body mass index (BMI) of 50.0 to 59.9 in adult (HCC) 12/17/2016   At risk for  diabetes mellitus 10/02/2016   Hyperglycemia 09/04/2016   Severe obesity (BMI >= 40) (HCC) 01/15/2013   OBESITY, MORBID 01/20/2007   Essential hypertension 10/31/2006    PCP: Shirline Frees, NP  REFERRING PROVIDER: Julio Sicks, MD  REFERRING DIAG: [phone] (ICD-10-CM) - Spinal stenosis, cervical region  THERAPY DIAG:  Cervicalgia  Muscle weakness (generalized)  Cramp and spasm  Other abnormalities of gait and mobility  Dizziness and giddiness  Rationale for Evaluation and Treatment: Rehabilitation  ONSET DATE: June 2024 pain started getting worse  SUBJECTIVE:  SUBJECTIVE STATEMENT: Patient reports that he is having some increased pain.  States that his legs have been hurting the past couple of days and he has been having some numbness in the past couple of days.  Hand dominance: Right  PERTINENT HISTORY:  pre-diabetes, HTN, cervical radiculopathy, OSA, vertigo  PAIN:  Are you having pain? Yes: NPRS scale: 6/10 Pain location: cervical, scapular, shoulders Pain description: sharp, tingling into bilateral hands Aggravating factors: certain positions (especially with lying on his side) Relieving factors: certain supportive positions  PRECAUTIONS: None  RED FLAGS: None     WEIGHT BEARING RESTRICTIONS: No  FALLS:  Has patient fallen in last 6 months? No  LIVING ENVIRONMENT: Lives with: lives with their spouse lives with wife who is primarily w/c bound and needs assistance with most daily activities Lives in: House/apartment Stairs:  2 story home, but they live on one level.  They have a platform lift to help get to driveway. Has following equipment at home: Single point cane and Grab bars  OCCUPATION: Retired  PLOF: Independent  PATIENT GOALS: To avoid cervical  surgery and some pain relief.  NEXT MD VISIT:  Dr Jordan Likes on 04/11/2023  OBJECTIVE:  Note: Objective measures were completed at Evaluation unless otherwise noted.  DIAGNOSTIC FINDINGS:   Brain MRI on 04/18/2023 IMPRESSION: 1. No evidence of an acute intracranial abnormality. 2. Mild chronic small vessel ischemic changes within the cerebral white matter. 3. Mild generalized cerebral atrophy. 4. Moderate-to-severe mucosal thickening within the right sphenoid sinus.   Cervical MRI on 12/12/2022: IMPRESSION: 1. Degenerative disc bulge with uncovertebral and facet hypertrophy at C5-6 with resultant mild canal, with severe right worse than left C6 foraminal stenosis. Reactive endplate changes with marrow edema at this level could contribute to neck pain. 2. Mild left C7 foraminal narrowing related to disc bulge and uncovertebral disease. 3. Additional mild spondylosis elsewhere within the cervical spine as above without significant stenosis or impingement.  Lumbar MRI on 12/12/2022: IMPRESSION: 1. Mild spinal and bilateral lateral recess stenosis at L4-5. There is also a shallow broad-based left foraminal disc protrusion potentially irritating the left nerve root. 2. Mild bilateral foraminal encroachment at L3-4. 3. Advanced facet disease at L5-S1 but no spinal or foraminal stenosis.  PATIENT SURVEYS:  Eval:  NDI 32 / 50 = 64.0 % 04/15/2023:  Neck Disability Index score: 32 / 50 = 64.0 %  COGNITION: Overall cognitive status: Within functional limits for tasks assessed  SENSATION: Pt reports tingling down bilateral UE  POSTURE: rounded shoulders, forward head, and flexed trunk   PALPATION: Tenderness to palpation with muscle spasms noted   CERVICAL ROM:   Active ROM A/ROM (deg) eval A/ROM (Deg) 04/15/23  Flexion 55 45  Extension 25 27  Right lateral flexion 30 with pain 35  Left lateral flexion 35 35  Right rotation 48 55  Left rotation 55 55   (Blank rows = not  tested)  UPPER EXTREMITY ROM:  Eval:  WFL  UPPER EXTREMITY MMT:  Eval:   Right shoulder strength grossly 4-/5 Left is WFL  CERVICAL SPECIAL TESTS:  Eval:  Distraction test: decrease in pain  FUNCTIONAL TESTS:  Eval: 5 times sit to stand: 15.92 sec Timed up and go (TUG): 15.12 sec  TODAY'S TREATMENT:  DATE:  04/22/2023 Nustep level 1 x3 min with PT present to discuss status Cervical SNAGs for rotation x20 bilat Cervical SNAGs for extension x20 Seated shoulder horizontal abduction with red tband 2x10 Seated shoulder shoulder ER with red tband 2x10 Seated shoulder D2 with red tband 2x10 Seated shoulder rows and extension with green tband x20 each bilat Seated shoulder punches with 2# 2x10 Seated shoulder flexion, scaption, and abduction with 2# 2x10 each bilat E-stim:  IFC to bilateral upper traps at normal settings with intensity adjusted to tolerance x15 min Moist heat pack applied concurrently with e-stim   04/17/2023 Nustep level 1 x3 min with PT present to discuss status Cervical SNAGs for rotation x20 bilat Cervical SNAGs for extension x20 Seated shoulder horizontal abduction with red tband 2x10 Seated shoulder shoulder ER with red tband 2x10 Seated shoulder rows and extension with green tband x20 each bilat Seated shoulder flexion, scaption, and abduction with 2# 2x10 each bilat Manual Therapy:  soft tissue mobilization to bilateral cervical paraspinals and upper traps to promote decreased trigger pints and tissue elongation.   04/15/2023 Nustep level 1 x3 min with PT present to discuss status Cervical SNAGs for rotation x20 bilat Cervical SNAGs for extension x20 Neck Disability Index score: 32 / 50 = 64.0 % Seated shoulder horizontal abduction with red tband 2x10 Seated shoulder shoulder ER with red tband 2x10 Cervical A/ROM Seated  shoulder D2 with red tband 2x10 Seated shoulder punches with 2# 2x10 Seated shoulder flexion, scaption, and abduction with 2# 2x10 each bilat Seated shoulder rows and extension with green tband x20 each bilat     PATIENT EDUCATION:  Education details: Issued HEP Person educated: Patient Education method: Explanation, Facilities manager, and Handouts Education comprehension: verbalized understanding  HOME EXERCISE PROGRAM: Access Code: [email or web address] URL: [email or web address] Date: 03/07/2023 Prepared by: Reather Laurence  Exercises - Seated Scapular Retraction  - 1 x daily - 7 x weekly - 2 sets - 10 reps - Seated Cervical Retraction  - 1 x daily - 7 x weekly - 2 sets - 10 reps - Seated Assisted Cervical Rotation with Towel  - 1 x daily - 7 x weekly - 1-2 sets - 10 reps - Mid-Lower Cervical Extension SNAG with Strap  - 1 x daily - 7 x weekly - 1-2 sets - 10 reps  ASSESSMENT:  CLINICAL IMPRESSION: Mr Juhnke presents to PT reporting increased pain today.  Patient with good safety throughout with only minimal cuing throughout.  Patient educated on electrical stimulation and he was agreeable to trying this modality at the end of the session.  Patient with concurrent moist heat applied with IFC.  Educated pt about possibly obtaining a TENS unit if he likes the electrical stimulation for home use, patient verbalizes his understanding.  Patient to see Dr Jordan Likes on Wednesday afternoon this week.   OBJECTIVE IMPAIRMENTS: decreased mobility, decreased ROM, dizziness, impaired perceived functional ability, increased muscle spasms, impaired UE functional use, postural dysfunction, and pain.   ACTIVITY LIMITATIONS: carrying, lifting, sleeping, and transfers  PARTICIPATION LIMITATIONS: cleaning, laundry, driving, and community activity  PERSONAL FACTORS: Past/current experiences, Time since onset of injury/illness/exacerbation, and 3+ comorbidities: pre-diabetes, carpal tunnel, OA  are also  affecting patient's functional outcome.   REHAB POTENTIAL: Good  CLINICAL DECISION MAKING: Evolving/moderate complexity  EVALUATION COMPLEXITY: Moderate   GOALS: Goals reviewed with patient? Yes  SHORT TERM GOALS: Target date: 03/22/2023  Pt will be independent with initial HEP. Baseline:  Goal status: Met on 03/18/23  2.  Patient will  report at least a 25% improvement in symptoms since starting PT. Baseline:  Goal status: Met on 03/27/2023   LONG TERM GOALS: Target date: 05/03/2023  Patient will be independent with advanced HEP. Baseline:  Goal status: Ongoing  2.  Patient will improve Neck Disability Index to no greater than 50% to demonstrate improvements in functional tasks. Baseline: 64% Goal status: Ongoing  3.  Patient will increase cervical A/ROM to [facility] without increased pain or dizziness to allow for improved ease with driving. Baseline:  Goal status: Ongoing (see above)  4.  Patient will increase right shoulder strength to at least 4/5 to allow him to be able to perform functional tasks with increased ease. Baseline:  Goal status: Ongoing  5.  Patient will report at least a 50% improvement in pain symptoms since starting PT. Baseline:  Goal status: Met on 03/27/2023 (following cervical injection)    PLAN:  PT FREQUENCY: 2x/week  PT DURATION: 8 weeks  PLANNED INTERVENTIONS: 97164- PT Re-evaluation, 97110-Therapeutic exercises, 97530- Therapeutic activity, 97112- Neuromuscular re-education, 97535- Self Care, 40981- Manual therapy, [phone]- Canalith repositioning, [id number]- Aquatic Therapy, 97014- Electrical stimulation (unattended), [id number]- Electrical stimulation (manual), [id number]- Ultrasound, 82956- Traction (mechanical), [id number]- Ionotophoresis 4mg /ml Dexamethasone, Patient/Family education, Balance training, Taping, Dry Needling, Joint mobilization, Joint manipulation, Spinal manipulation, Spinal mobilization, Cryotherapy, and Moist heat  PLAN FOR NEXT SESSION:  Assess and progress HEP as indicated, strengthening, ROM, manual/dry needling as indicated, assess response to e-stim   Reather Laurence, PT, DPT 04/22/23, 8:58 AM  [facility] [address], [address] [place], [place] [address] Phone # [phone] Fax [phone]

## 2023-04-24 ENCOUNTER — Ambulatory Visit: Payer: HMO | Admitting: Rehabilitative and Restorative Service Providers"

## 2023-04-24 ENCOUNTER — Encounter: Payer: Self-pay | Admitting: Rehabilitative and Restorative Service Providers"

## 2023-04-24 DIAGNOSIS — R2689 Other abnormalities of gait and mobility: Secondary | ICD-10-CM

## 2023-04-24 DIAGNOSIS — M6281 Muscle weakness (generalized): Secondary | ICD-10-CM

## 2023-04-24 DIAGNOSIS — M542 Cervicalgia: Secondary | ICD-10-CM | POA: Diagnosis not present

## 2023-04-24 DIAGNOSIS — R42 Dizziness and giddiness: Secondary | ICD-10-CM

## 2023-04-24 DIAGNOSIS — R252 Cramp and spasm: Secondary | ICD-10-CM

## 2023-04-24 DIAGNOSIS — Z6841 Body Mass Index (BMI) 40.0 and over, adult: Secondary | ICD-10-CM | POA: Diagnosis not present

## 2023-04-24 DIAGNOSIS — M4802 Spinal stenosis, cervical region: Secondary | ICD-10-CM | POA: Diagnosis not present

## 2023-04-24 NOTE — Therapy (Signed)
OUTPATIENT PHYSICAL THERAPY TREATMENT NOTE   Patient Name: James [name] MRN: [id number] DOB:1955/10/02, 67 y.o., male Today's Date: 04/24/2023    END OF SESSION:  PT End of Session - 04/24/23 0808     Visit Number 12    Date for PT Re-Evaluation 05/03/23    Authorization Type [employer]    Progress Note Due on Visit 19    PT Start Time 0802    PT Stop Time 0842    PT Time Calculation (min) 40 min    Activity Tolerance Patient tolerated treatment well    Behavior During Therapy [facility] for tasks assessed/performed             Past Medical History:  Diagnosis Date   Arthritis    Bilateral swelling of feet    Chicken pox    Complication of anesthesia    difficulty waking up   Depression    Hypertension    Joint pain    Obesity    Osteoarthritis    Prediabetes    Shortness of breath    Swelling    feet and legs   Vitamin D deficiency    Past Surgical History:  Procedure Laterality Date   cartilage removal     COLONOSCOPY WITH PROPOFOL N/A 12/14/2019   Procedure: COLONOSCOPY WITH PROPOFOL;  Surgeon: Sherrilyn Rist, MD;  Location: [facility];  Service: Gastroenterology;  Laterality: N/A;   KNEE SURGERY  2000   POLYPECTOMY  12/14/2019   Procedure: POLYPECTOMY;  Surgeon: Sherrilyn Rist, MD;  Location: [facility];  Service: Gastroenterology;;   TOTAL HIP ARTHROPLASTY Left 02/11/2019   Procedure: TOTAL HIP ARTHROPLASTY-POSTERIOR;  Surgeon: Ollen Gross, MD;  Location: [facility];  Service: Orthopedics;  Laterality: Left;    Patient Active Problem List   Diagnosis Date Noted   Depression 09/08/2020   Special screening for malignant neoplasms, colon    Benign neoplasm of rectum    OA (osteoarthritis) of hip 02/11/2019   Other hyperlipidemia 04/10/2017   Vitamin D deficiency 04/10/2017   Prediabetes 12/17/2016   Class 3 severe obesity with serious comorbidity and body mass index (BMI) of 50.0 to 59.9 in adult (HCC) 12/17/2016   At risk for  diabetes mellitus 10/02/2016   Hyperglycemia 09/04/2016   Severe obesity (BMI >= 40) (HCC) 01/15/2013   OBESITY, MORBID 01/20/2007   Essential hypertension 10/31/2006    PCP: Shirline Frees, NP  REFERRING PROVIDER: Julio Sicks, MD  REFERRING DIAG: [phone] (ICD-10-CM) - Spinal stenosis, cervical region  THERAPY DIAG:  Cervicalgia  Muscle weakness (generalized)  Cramp and spasm  Other abnormalities of gait and mobility  Dizziness and giddiness  Rationale for Evaluation and Treatment: Rehabilitation  ONSET DATE: June 2024 pain started getting worse  SUBJECTIVE:  SUBJECTIVE STATEMENT: Patient reports that the e-stim seemed to help a lot, reports that he may be interested in a TENS unit.  Patient reports that while sitting in the waiting room, he started getting some dizziness/light headedness.  Patient states that he has only had coffee and no water yet this morning.  After drinking water in the clinic, pt did report feeling some better.  Hand dominance: Right  PERTINENT HISTORY:  pre-diabetes, HTN, cervical radiculopathy, OSA, vertigo  PAIN:  Are you having pain? Yes: NPRS scale: 4/10 Pain location: cervical, scapular, shoulders Pain description: sharp, tingling into bilateral hands Aggravating factors: certain positions (especially with lying on his side) Relieving factors: certain supportive positions  PRECAUTIONS: None  RED FLAGS: None     WEIGHT BEARING RESTRICTIONS: No  FALLS:  Has patient fallen in last 6 months? No  LIVING ENVIRONMENT: Lives with: lives with their spouse lives with wife who is primarily w/c bound and needs assistance with most daily activities Lives in: House/apartment Stairs:  2 story home, but they live on one level.  They have a platform  lift to help get to driveway. Has following equipment at home: Single point cane and Grab bars  OCCUPATION: Retired  PLOF: Independent  PATIENT GOALS: To avoid cervical surgery and some pain relief.  NEXT MD VISIT:  Dr Jordan Likes on 04/11/2023  OBJECTIVE:  Note: Objective measures were completed at Evaluation unless otherwise noted.  DIAGNOSTIC FINDINGS:   Brain MRI on 04/18/2023 IMPRESSION: 1. No evidence of an acute intracranial abnormality. 2. Mild chronic small vessel ischemic changes within the cerebral white matter. 3. Mild generalized cerebral atrophy. 4. Moderate-to-severe mucosal thickening within the right sphenoid sinus.   Cervical MRI on 12/12/2022: IMPRESSION: 1. Degenerative disc bulge with uncovertebral and facet hypertrophy at C5-6 with resultant mild canal, with severe right worse than left C6 foraminal stenosis. Reactive endplate changes with marrow edema at this level could contribute to neck pain. 2. Mild left C7 foraminal narrowing related to disc bulge and uncovertebral disease. 3. Additional mild spondylosis elsewhere within the cervical spine as above without significant stenosis or impingement.  Lumbar MRI on 12/12/2022: IMPRESSION: 1. Mild spinal and bilateral lateral recess stenosis at L4-5. There is also a shallow broad-based left foraminal disc protrusion potentially irritating the left nerve root. 2. Mild bilateral foraminal encroachment at L3-4. 3. Advanced facet disease at L5-S1 but no spinal or foraminal stenosis.  PATIENT SURVEYS:  Eval:  NDI 32 / 50 = 64.0 % 04/15/2023:  Neck Disability Index score: 32 / 50 = 64.0 %  COGNITION: Overall cognitive status: Within functional limits for tasks assessed  SENSATION: Pt reports tingling down bilateral UE  POSTURE: rounded shoulders, forward head, and flexed trunk   PALPATION: Tenderness to palpation with muscle spasms noted   CERVICAL ROM:   Active ROM A/ROM (deg) eval A/ROM (Deg) 04/15/23   Flexion 55 45  Extension 25 27  Right lateral flexion 30 with pain 35  Left lateral flexion 35 35  Right rotation 48 55  Left rotation 55 55   (Blank rows = not tested)  UPPER EXTREMITY ROM:  Eval:  WFL  UPPER EXTREMITY MMT:  Eval:   Right shoulder strength grossly 4-/5 Left is WFL  CERVICAL SPECIAL TESTS:  Eval:  Distraction test: decrease in pain  FUNCTIONAL TESTS:  Eval: 5 times sit to stand: 15.92 sec Timed up and go (TUG): 15.12 sec  TODAY'S TREATMENT:  DATE:  04/24/2023 Cervical SNAGs for rotation x20 bilat Cervical SNAGs for extension x20 Seated shoulder horizontal abduction with red tband 2x10 Seated shoulder shoulder ER with red tband 2x10 Seated shoulder D2 with red tband 2x10 Seated shoulder rows and extension with green tband x20 each bilat Seated shoulder punches with 2# 2x10 Seated shoulder flexion, scaption, and abduction with 2# 2x10 each bilat E-stim:  IFC to bilateral upper traps at normal settings with intensity adjusted to tolerance x15 min Moist heat pack applied concurrently with e-stim   04/22/2023 Nustep level 1 x3 min with PT present to discuss status Cervical SNAGs for rotation x20 bilat Cervical SNAGs for extension x20 Seated shoulder horizontal abduction with red tband 2x10 Seated shoulder shoulder ER with red tband 2x10 Seated shoulder D2 with red tband 2x10 Seated shoulder rows and extension with green tband x20 each bilat Seated shoulder punches with 2# 2x10 Seated shoulder flexion, scaption, and abduction with 2# 2x10 each bilat E-stim:  IFC to bilateral upper traps at normal settings with intensity adjusted to tolerance x15 min Moist heat pack applied concurrently with e-stim   04/17/2023 Nustep level 1 x3 min with PT present to discuss status Cervical SNAGs for rotation x20 bilat Cervical SNAGs for  extension x20 Seated shoulder horizontal abduction with red tband 2x10 Seated shoulder shoulder ER with red tband 2x10 Seated shoulder rows and extension with green tband x20 each bilat Seated shoulder flexion, scaption, and abduction with 2# 2x10 each bilat Manual Therapy:  soft tissue mobilization to bilateral cervical paraspinals and upper traps to promote decreased trigger pints and tissue elongation.    PATIENT EDUCATION:  Education details: Issued HEP Person educated: Patient Education method: Explanation, Facilities manager, and Handouts Education comprehension: verbalized understanding  HOME EXERCISE PROGRAM: Access Code: [email or web address] URL: [email or web address] Date: 03/07/2023 Prepared by: Clydie [name] Breaunna Gottlieb  Exercises - Seated Scapular Retraction  - 1 x daily - 7 x weekly - 2 sets - 10 reps - Seated Cervical Retraction  - 1 x daily - 7 x weekly - 2 sets - 10 reps - Seated Assisted Cervical Rotation with Towel  - 1 x daily - 7 x weekly - 1-2 sets - 10 reps - Mid-Lower Cervical Extension SNAG with Strap  - 1 x daily - 7 x weekly - 1-2 sets - 10 reps  ASSESSMENT:  CLINICAL IMPRESSION: Mr Affleck presents to PT reporting some dizziness/light headedness this morning.  Patient did report that he have some relief with use of e-stim.  Patient reported feeling some better after drinking water in the clinic.  Patient able to progress with seated exercises.  Patient continues to have a relief of his pain with use of e-stim again today.  Patient to follow up with Dr Jordan Likes later today, recommended that he speak with Dr Jordan Likes about a TENS unit for possible insurance to assist with paying for it.  Will assess for any new recommendations following appointment with Dr Jordan Likes.   OBJECTIVE IMPAIRMENTS: decreased mobility, decreased ROM, dizziness, impaired perceived functional ability, increased muscle spasms, impaired UE functional use, postural dysfunction, and pain.   ACTIVITY LIMITATIONS:  carrying, lifting, sleeping, and transfers  PARTICIPATION LIMITATIONS: cleaning, laundry, driving, and community activity  PERSONAL FACTORS: Past/current experiences, Time since onset of injury/illness/exacerbation, and 3+ comorbidities: pre-diabetes, carpal tunnel, OA  are also affecting patient's functional outcome.   REHAB POTENTIAL: Good  CLINICAL DECISION MAKING: Evolving/moderate complexity  EVALUATION COMPLEXITY: Moderate   GOALS: Goals reviewed with patient? Yes  SHORT TERM GOALS: Target date: 03/22/2023  Pt will be independent with initial HEP. Baseline:  Goal status: Met on 03/18/23  2.  Patient will report at least a 25% improvement in symptoms since starting PT. Baseline:  Goal status: Met on 03/27/2023   LONG TERM GOALS: Target date: 05/03/2023  Patient will be independent with advanced HEP. Baseline:  Goal status: Ongoing  2.  Patient will improve Neck Disability Index to no greater than 50% to demonstrate improvements in functional tasks. Baseline: 64% Goal status: Ongoing  3.  Patient will increase cervical A/ROM to [facility] without increased pain or dizziness to allow for improved ease with driving. Baseline:  Goal status: Ongoing (see above)  4.  Patient will increase right shoulder strength to at least 4/5 to allow him to be able to perform functional tasks with increased ease. Baseline:  Goal status: Ongoing  5.  Patient will report at least a 50% improvement in pain symptoms since starting PT. Baseline:  Goal status: Met on 03/27/2023 (following cervical injection)    PLAN:  PT FREQUENCY: 2x/week  PT DURATION: 8 weeks  PLANNED INTERVENTIONS: 97164- PT Re-evaluation, 97110-Therapeutic exercises, 97530- Therapeutic activity, 97112- Neuromuscular re-education, 97535- Self Care, 51884- Manual therapy, [id number]- Canalith repositioning, [id number]- Aquatic Therapy, 97014- Electrical stimulation (unattended), [id number]- Electrical stimulation (manual), [id number]-  Ultrasound, 30160- Traction (mechanical), [id number]- Ionotophoresis 4mg /ml Dexamethasone, Patient/Family education, Balance training, Taping, Dry Needling, Joint mobilization, Joint manipulation, Spinal manipulation, Spinal mobilization, Cryotherapy, and Moist heat  PLAN FOR NEXT SESSION: Assess and progress HEP as indicated, strengthening, ROM, manual/dry needling as indicated, assess response to e-stim   Reather Laurence, PT, DPT 04/24/23, 8:41 AM  [facility] [address] [place], [place] [address] Phone # [phone] Fax [phone]

## 2023-04-25 ENCOUNTER — Other Ambulatory Visit: Payer: Self-pay | Admitting: Adult Health

## 2023-04-25 DIAGNOSIS — I10 Essential (primary) hypertension: Secondary | ICD-10-CM

## 2023-04-29 ENCOUNTER — Ambulatory Visit: Payer: HMO | Admitting: Rehabilitative and Restorative Service Providers"

## 2023-04-29 ENCOUNTER — Encounter: Payer: Self-pay | Admitting: Rehabilitative and Restorative Service Providers"

## 2023-04-29 DIAGNOSIS — R252 Cramp and spasm: Secondary | ICD-10-CM

## 2023-04-29 DIAGNOSIS — M542 Cervicalgia: Secondary | ICD-10-CM

## 2023-04-29 DIAGNOSIS — R42 Dizziness and giddiness: Secondary | ICD-10-CM

## 2023-04-29 DIAGNOSIS — M6281 Muscle weakness (generalized): Secondary | ICD-10-CM

## 2023-04-29 DIAGNOSIS — R2689 Other abnormalities of gait and mobility: Secondary | ICD-10-CM

## 2023-04-29 NOTE — Therapy (Signed)
OUTPATIENT PHYSICAL THERAPY TREATMENT NOTE AND REASSESSMENT NOTE   Patient Name: James [name] MRN: [id number] DOB:11-20-1955, 67 y.o., male Today's Date: 04/29/2023  Progress Note Reporting Period 04/15/2023 to 04/29/2023  See note below for Objective Data and Assessment of Progress/Goals.     END OF SESSION:  PT End of Session - 04/29/23 0807     Visit Number 13    Date for PT Re-Evaluation 05/03/23    Authorization Type [employer]    Progress Note Due on Visit 23    PT Start Time 0800    PT Stop Time 0900    PT Time Calculation (min) 60 min    Activity Tolerance Patient tolerated treatment well    Behavior During Therapy WFL for tasks assessed/performed             Past Medical History:  Diagnosis Date   Arthritis    Bilateral swelling of feet    Chicken pox    Complication of anesthesia    difficulty waking up   Depression    Hypertension    Joint pain    Obesity    Osteoarthritis    Prediabetes    Shortness of breath    Swelling    feet and legs   Vitamin D deficiency    Past Surgical History:  Procedure Laterality Date   cartilage removal     COLONOSCOPY WITH PROPOFOL N/A 12/14/2019   Procedure: COLONOSCOPY WITH PROPOFOL;  Surgeon: Sherrilyn Rist, MD;  Location: [facility];  Service: Gastroenterology;  Laterality: N/A;   KNEE SURGERY  2000   POLYPECTOMY  12/14/2019   Procedure: POLYPECTOMY;  Surgeon: Sherrilyn Rist, MD;  Location: [facility];  Service: Gastroenterology;;   TOTAL HIP ARTHROPLASTY Left 02/11/2019   Procedure: TOTAL HIP ARTHROPLASTY-POSTERIOR;  Surgeon: Ollen Gross, MD;  Location: [facility];  Service: Orthopedics;  Laterality: Left;    Patient Active Problem List   Diagnosis Date Noted   Depression 09/08/2020   Special screening for malignant neoplasms, colon    Benign neoplasm of rectum    OA (osteoarthritis) of hip 02/11/2019   Other hyperlipidemia 04/10/2017   Vitamin D deficiency 04/10/2017    Prediabetes 12/17/2016   Class 3 severe obesity with serious comorbidity and body mass index (BMI) of 50.0 to 59.9 in adult (HCC) 12/17/2016   At risk for diabetes mellitus 10/02/2016   Hyperglycemia 09/04/2016   Severe obesity (BMI >= 40) (HCC) 01/15/2013   OBESITY, MORBID 01/20/2007   Essential hypertension 10/31/2006    PCP: Shirline Frees, NP  REFERRING PROVIDER: Julio Sicks, MD  REFERRING DIAG: [phone] (ICD-10-CM) - Spinal stenosis, cervical region  THERAPY DIAG:  Cervicalgia - Plan: PT plan of care cert/re-cert  Muscle weakness (generalized) - Plan: PT plan of care cert/re-cert  Cramp and spasm - Plan: PT plan of care cert/re-cert  Other abnormalities of gait and mobility - Plan: PT plan of care cert/re-cert  Dizziness and giddiness - Plan: PT plan of care cert/re-cert  Rationale for Evaluation and Treatment: Rehabilitation  ONSET DATE: June 2024 pain started getting worse  SUBJECTIVE:  SUBJECTIVE STATEMENT: Patient reports that Dr Jordan Likes advised him that he could obtain a TENS unit, as well, if it seemed to be helping.  Patient does continue to report that TENS unit has been helping.  Reports similar pain to last time.  Hand dominance: Right  PERTINENT HISTORY:  pre-diabetes, HTN, cervical radiculopathy, OSA, vertigo  PAIN:  Are you having pain? Yes: NPRS scale: 4/10 Pain location: cervical, scapular, shoulders Pain description: sharp, tingling into bilateral hands Aggravating factors: certain positions (especially with lying on his side) Relieving factors: certain supportive positions  PRECAUTIONS: None  RED FLAGS: None     WEIGHT BEARING RESTRICTIONS: No  FALLS:  Has patient fallen in last 6 months? No  LIVING ENVIRONMENT: Lives with: lives with their  spouse lives with wife who is primarily w/c bound and needs assistance with most daily activities Lives in: House/apartment Stairs:  2 story home, but they live on one level.  They have a platform lift to help get to driveway. Has following equipment at home: Single point cane and Grab bars  OCCUPATION: Retired  PLOF: Independent  PATIENT GOALS: To avoid cervical surgery and some pain relief.  NEXT MD VISIT:  Dr Jordan Likes on 04/11/2023  OBJECTIVE:  Note: Objective measures were completed at Evaluation unless otherwise noted.  DIAGNOSTIC FINDINGS:   Brain MRI on 04/18/2023 IMPRESSION: 1. No evidence of an acute intracranial abnormality. 2. Mild chronic small vessel ischemic changes within the cerebral white matter. 3. Mild generalized cerebral atrophy. 4. Moderate-to-severe mucosal thickening within the right sphenoid sinus.   Cervical MRI on 12/12/2022: IMPRESSION: 1. Degenerative disc bulge with uncovertebral and facet hypertrophy at C5-6 with resultant mild canal, with severe right worse than left C6 foraminal stenosis. Reactive endplate changes with marrow edema at this level could contribute to neck pain. 2. Mild left C7 foraminal narrowing related to disc bulge and uncovertebral disease. 3. Additional mild spondylosis elsewhere within the cervical spine as above without significant stenosis or impingement.  Lumbar MRI on 12/12/2022: IMPRESSION: 1. Mild spinal and bilateral lateral recess stenosis at L4-5. There is also a shallow broad-based left foraminal disc protrusion potentially irritating the left nerve root. 2. Mild bilateral foraminal encroachment at L3-4. 3. Advanced facet disease at L5-S1 but no spinal or foraminal stenosis.  PATIENT SURVEYS:  Eval:  NDI 32 / 50 = 64.0 % 04/15/2023:  Neck Disability Index score: 32 / 50 = 64.0 % 04/29/2023:  Neck Disability Index score: 27 / 50 = 54.0 %  COGNITION: Overall cognitive status: Within functional limits for tasks  assessed  SENSATION: Pt reports tingling down bilateral UE  POSTURE: rounded shoulders, forward head, and flexed trunk   PALPATION: Tenderness to palpation with muscle spasms noted   CERVICAL ROM:   Active ROM A/ROM (deg) eval A/ROM (Deg) 04/15/23 A/ROM (Deg) 04/29/23  Flexion 55 45 55  Extension 25 27 25   Right lateral flexion 30 with pain 35 35  Left lateral flexion 35 35 35  Right rotation 48 55 55  Left rotation 55 55 65   (Blank rows = not tested)  UPPER EXTREMITY ROM:  Eval:  WFL  UPPER EXTREMITY MMT:  Eval:   Right shoulder strength grossly 4-/5 Left is [facility]  CERVICAL SPECIAL TESTS:  Eval:  Distraction test: decrease in pain  FUNCTIONAL TESTS:  Eval: 5 times sit to stand: 15.92 sec Timed up and go (TUG): 15.12 sec  04/29/2023: 5 times sit to stand:  16.74 sec  TODAY'S TREATMENT:  DATE:  04/29/2023 Nustep level 1 x3 min with PT present to discuss status Cervical SNAGs for rotation x20 bilat Cervical SNAGs for extension x20 Seated shoulder horizontal abduction with red tband 2x10 Seated shoulder shoulder ER with red tband 2x10 Seated shoulder D2 with red tband 2x10 Neck Disability Index, cervical A/ROM Seated shoulder punches with 2# 2x10 Seated shoulder flexion, scaption, and abduction with 2# 2x10 each bilat E-stim:  IFC to bilateral upper traps at normal settings with intensity adjusted to tolerance x15 min Moist heat pack applied concurrently with e-stim   04/24/2023 Cervical SNAGs for rotation x20 bilat Cervical SNAGs for extension x20 Seated shoulder horizontal abduction with red tband 2x10 Seated shoulder shoulder ER with red tband 2x10 Seated shoulder D2 with red tband 2x10 Seated shoulder rows and extension with green tband x20 each bilat Seated shoulder punches with 2# 2x10 Seated shoulder flexion, scaption, and  abduction with 2# 2x10 each bilat E-stim:  IFC to bilateral upper traps at normal settings with intensity adjusted to tolerance x15 min Moist heat pack applied concurrently with e-stim   04/22/2023 Nustep level 1 x3 min with PT present to discuss status Cervical SNAGs for rotation x20 bilat Cervical SNAGs for extension x20 Seated shoulder horizontal abduction with red tband 2x10 Seated shoulder shoulder ER with red tband 2x10 Seated shoulder D2 with red tband 2x10 Seated shoulder rows and extension with green tband x20 each bilat Seated shoulder punches with 2# 2x10 Seated shoulder flexion, scaption, and abduction with 2# 2x10 each bilat E-stim:  IFC to bilateral upper traps at normal settings with intensity adjusted to tolerance x15 min Moist heat pack applied concurrently with e-stim    PATIENT EDUCATION:  Education details: Issued HEP Person educated: Patient Education method: Explanation, Facilities manager, and Handouts Education comprehension: verbalized understanding  HOME EXERCISE PROGRAM: Access Code: [email or web address] URL: [email or web address] Date: 03/07/2023 Prepared by: Reather Laurence  Exercises - Seated Scapular Retraction  - 1 x daily - 7 x weekly - 2 sets - 10 reps - Seated Cervical Retraction  - 1 x daily - 7 x weekly - 2 sets - 10 reps - Seated Assisted Cervical Rotation with Towel  - 1 x daily - 7 x weekly - 1-2 sets - 10 reps - Mid-Lower Cervical Extension SNAG with Strap  - 1 x daily - 7 x weekly - 1-2 sets - 10 reps  ASSESSMENT:  CLINICAL IMPRESSION: James [name] presents to PT reporting that he is overall making progress towards goals, but Dr Jordan Likes wants him to continue with PT and was in agreement with patient obtaining a TENS unit.  Educated pt on various places where he could purchase a TENS unit for home use, he verbalized his understanding.  Patient with some improvements in cervical A/ROM, particularly in cervical rotation.  Patient continues to  progress with increased UE strengthening.  Limited on standing exercises secondary to knee pain.  Patient with improvement on Neck Disability Index today.  Patient is making great progress towards goals, but has not yet met all goals and would continue to benefit from skilled PT of 2x/week for 8 additional weeks to progress towards goal related activities.   OBJECTIVE IMPAIRMENTS: decreased mobility, decreased ROM, dizziness, impaired perceived functional ability, increased muscle spasms, impaired UE functional use, postural dysfunction, and pain.   ACTIVITY LIMITATIONS: carrying, lifting, sleeping, and transfers  PARTICIPATION LIMITATIONS: cleaning, laundry, driving, and community activity  PERSONAL FACTORS: Past/current experiences, Time since onset of injury/illness/exacerbation, and 3+ comorbidities: pre-diabetes, carpal tunnel, OA  are  also affecting patient's functional outcome.   REHAB POTENTIAL: Good  CLINICAL DECISION MAKING: Evolving/moderate complexity  EVALUATION COMPLEXITY: Moderate   GOALS: Goals reviewed with patient? Yes  SHORT TERM GOALS: Target date: 03/22/2023  Pt will be independent with initial HEP. Baseline:  Goal status: Met on 03/18/23  2.  Patient will report at least a 25% improvement in symptoms since starting PT. Baseline:  Goal status: Met on 03/27/2023   LONG TERM GOALS: Target date: 07/05/2023  Patient will be independent with advanced HEP. Baseline:  Goal status: Ongoing  2.  Patient will improve Neck Disability Index to no greater than 50% to demonstrate improvements in functional tasks. Baseline: 64% Goal status: Ongoing (see above)  3.  Patient will increase cervical A/ROM to [facility] without increased pain or dizziness to allow for improved ease with driving. Baseline:  Goal status: Met on 04/29/2023  4.  Patient will increase right shoulder strength to at least 4/5 to allow him to be able to perform functional tasks with increased  ease. Baseline:  Goal status: Ongoing  5.  Patient will report at least a 50% improvement in pain symptoms since starting PT. Baseline:  Goal status: Met on 03/27/2023 (following cervical injection)    PLAN:  PT FREQUENCY: 2x/week  PT DURATION: 8 weeks  PLANNED INTERVENTIONS: 97164- PT Re-evaluation, 97110-Therapeutic exercises, 97530- Therapeutic activity, 97112- Neuromuscular re-education, 97535- Self Care, 16109- Manual therapy, [phone]- Canalith repositioning, [id number]- Aquatic Therapy, 97014- Electrical stimulation (unattended), [id number]- Electrical stimulation (manual), [id number]- Ultrasound, 09811- Traction (mechanical), [id number]- Ionotophoresis 4mg /ml Dexamethasone, Patient/Family education, Balance training, Taping, Dry Needling, Joint mobilization, Joint manipulation, Spinal manipulation, Spinal mobilization, Cryotherapy, and Moist heat  PLAN FOR NEXT SESSION: Assess and progress HEP as indicated, strengthening, ROM, manual/dry needling as indicated, assess response to e-stim   Reather Laurence, PT, DPT 04/29/23, 10:27 AM  [facility] [address] [place], [place] [address] Phone # [phone] Fax [phone]

## 2023-05-03 ENCOUNTER — Ambulatory Visit: Payer: HMO | Admitting: Rehabilitative and Restorative Service Providers"

## 2023-05-09 ENCOUNTER — Telehealth: Payer: HMO | Admitting: Family Medicine

## 2023-05-09 DIAGNOSIS — J22 Unspecified acute lower respiratory infection: Secondary | ICD-10-CM | POA: Diagnosis not present

## 2023-05-09 MED ORDER — GUAIFENESIN ER 600 MG PO TB12: 600.0000 mg | ORAL_TABLET | Freq: Two times a day (BID) | ORAL | 0 refills | Status: AC | PRN

## 2023-05-09 MED ORDER — AZITHROMYCIN 250 MG PO TABS: ORAL_TABLET | ORAL | 0 refills | Status: AC

## 2023-05-09 MED ORDER — PREDNISONE 20 MG PO TABS: 40.0000 mg | ORAL_TABLET | Freq: Every day | ORAL | 0 refills | Status: AC

## 2023-05-09 NOTE — Progress Notes (Signed)
 Virtual Visit Consent   James [name], you are scheduled for a virtual visit with a [facility] provider today. Just as with appointments in the office, your consent must be obtained to participate. Your consent will be active for this visit and any virtual visit you may have with one of our providers in the next 365 days. If you have a MyChart account, a copy of this consent can be sent to you electronically.  As this is a virtual visit, video technology does not allow for your provider to perform a traditional examination. This may limit your provider's ability to fully assess your condition. If your provider identifies any concerns that need to be evaluated in person or the need to arrange testing (such as labs, EKG, etc.), we will make arrangements to do so. Although advances in technology are sophisticated, we cannot ensure that it will always work on either your end or our end. If the connection with a video visit is poor, the visit may have to be switched to a telephone visit. With either a video or telephone visit, we are not always able to ensure that we have a secure connection.  By engaging in this virtual visit, you consent to the provision of healthcare and authorize for your insurance to be billed (if applicable) for the services provided during this visit. Depending on your insurance coverage, you may receive a charge related to this service.  I need to obtain your verbal consent now. Are you willing to proceed with your visit today? James [name] has provided verbal consent on 05/09/2023 for a virtual visit (video or telephone). Chiquita CHRISTELLA Barefoot, NP  Date: 05/09/2023 10:18 AM  Virtual Visit via Video Note   I, Chiquita CHRISTELLA Barefoot, connected with  James [name]  ([id number], 1955-10-07) on 05/09/23 at 10:15 AM EST by a video-enabled telemedicine application and verified that I am speaking with the correct person using two identifiers.  Location: Patient: Virtual Visit Location Patient:  Home Provider: Virtual Visit Location Provider: Home Office   I discussed the limitations of evaluation and management by telemedicine and the availability of in person appointments. The patient expressed understanding and agreed to proceed.    History of Present Illness: James [name] is a 68 y.o. who identifies as a male who was assigned male at birth, and is being seen today for URI  Onset was within the last 7 days congestion, cough Associated symptoms are mucus production- dark brown in color, wheezing, chest tightness at times, tired- unable to cough Modifying factors are corticin, hot tea with honey, cough drops  Denies chest pain, shortness of breath, fevers, chills  Exposure to sick contacts-  known- grandkids during christmas  COVID test: no - had covid in Aug and got booster Vaccines: up to date    Problems:  Patient Active Problem List   Diagnosis Date Noted   Depression 09/08/2020   Special screening for malignant neoplasms, colon    Benign neoplasm of rectum    OA (osteoarthritis) of hip 02/11/2019   Other hyperlipidemia 04/10/2017   Vitamin D  deficiency 04/10/2017   Prediabetes 12/17/2016   Class 3 severe obesity with serious comorbidity and body mass index (BMI) of 50.0 to 59.9 in adult (HCC) 12/17/2016   At risk for diabetes mellitus 10/02/2016   Hyperglycemia 09/04/2016   Severe obesity (BMI >= 40) (HCC) 01/15/2013   OBESITY, MORBID 01/20/2007   Essential hypertension 10/31/2006    Allergies: No Known Allergies Medications:  Current  Outpatient Medications:    acetaminophen  (TYLENOL ) 500 MG tablet, Take 1,000 mg by mouth at bedtime as needed for moderate pain., Disp: , Rfl:    amoxicillin (AMOXIL) 500 MG capsule, Take 2,000 mg by mouth See admin instructions. Take 2000 mg 1 hour prior to dental work, Disp: , Rfl:    cetirizine (ZYRTEC) 10 MG tablet, Take 10 mg by mouth daily., Disp: , Rfl:    cholecalciferol (VITAMIN D3) 25 MCG (1000 UNIT) tablet, Take  2,000 Units by mouth daily., Disp: , Rfl:    fluticasone (FLONASE) 50 MCG/ACT nasal spray, Place 1 spray into both nostrils daily. , Disp: , Rfl:    furosemide  (LASIX ) 40 MG tablet, Take 1 tablet by mouth daily for three days in a row prn ., Disp: 30 tablet, Rfl: 0   hydrochlorothiazide  (HYDRODIURIL ) 25 MG tablet, TAKE 1 TABLET (25 MG TOTAL) BY MOUTH DAILY., Disp: 30 tablet, Rfl: 0   ibuprofen (ADVIL) 200 MG tablet, Take 400 mg by mouth at bedtime as needed for moderate pain., Disp: , Rfl:    Ketotifen Fumarate (ALLERGY EYE DROPS OP), Place 1 drop into both eyes daily as needed (allergies)., Disp: , Rfl:    lidocaine  (LMX) 4 % cream, Apply 1 application topically daily as needed (pain)., Disp: , Rfl:    lisinopril  (ZESTRIL ) 10 MG tablet, TAKE 2 TABLETS BY MOUTH EVERY DAY, Disp: 60 tablet, Rfl: 0   Magnesium 250 MG TABS, Take by mouth. (Patient not taking: Reported on 03/07/2023), Disp: , Rfl:    Multiple Vitamin (MULTIVITAMIN) tablet, Take 1 tablet by mouth daily., Disp: , Rfl:    potassium chloride  SA (KLOR-CON  M) 20 MEQ tablet, Take 1 tablet (20 mEq total) by mouth daily. Take with lasix , Disp: 30 tablet, Rfl: 0  Observations/Objective: Patient is well-developed, well-nourished in no acute distress.  Resting comfortably  at home.  Head is normocephalic, atraumatic.  No labored breathing.  Speech is clear and coherent with logical content.  Patient is alert and oriented at baseline.  Cough and congestion noted during visit  Assessment and Plan:   1. Lower respiratory infection (Primary)  - azithromycin  (ZITHROMAX ) 250 MG tablet; Take 2 tablets on day 1, then 1 tablet daily on days 2 through 5  Dispense: 6 tablet; Refill: 0 - guaiFENesin  (MUCINEX ) 600 MG 12 hr tablet; Take 1 tablet (600 mg total) by mouth 2 (two) times daily as needed for up to 7 days for cough or to loosen phlegm.  Dispense: 14 tablet; Refill: 0 - predniSONE  (DELTASONE ) 20 MG tablet; Take 2 tablets (40 mg total) by mouth  daily with breakfast for 3 days.  Dispense: 6 tablet; Refill: 0  -given symptoms- concern for PNA, but no red flags will give coverage  - Increased rest - Increasing Fluids - Acetaminophen  / ibuprofen as needed for fever/pain.  - Salt water  gargling, chloraseptic spray and throat lozenges - Mucinex  if mucus is present and increasing.  - Saline nasal spray if congestion or if nasal passages feel dry. - Humidifying the air.   -Follow up in person if not improving    Follow Up Instructions: I discussed the assessment and treatment plan with the patient. The patient was provided an opportunity to ask questions and all were answered. The patient agreed with the plan and demonstrated an understanding of the instructions.  A copy of instructions were sent to the patient via MyChart unless otherwise noted below.     The patient was advised to call back or  seek an in-person evaluation if the symptoms worsen or if the condition fails to improve as anticipated.    Chiquita CHRISTELLA Barefoot, NP

## 2023-05-09 NOTE — Patient Instructions (Signed)
 James [name], thank you for joining James CHRISTELLA Barefoot, NP for today's virtual visit.  While this provider is not your primary care provider (PCP), if your PCP is located in our provider database this encounter information will be shared with them immediately following your visit.   A [facility] MyChart account gives you access to today's visit and all your visits, tests, and labs performed at [facility]  click here if you don't have a [facility] MyChart account or go to [email or web address]  Consent: (Patient) James [name] provided verbal consent for this virtual visit at the beginning of the encounter.  Current Medications:  Current Outpatient Medications:    azithromycin  (ZITHROMAX ) 250 MG tablet, Take 2 tablets on day 1, then 1 tablet daily on days 2 through 5, Disp: 6 tablet, Rfl: 0   guaiFENesin  (MUCINEX ) 600 MG 12 hr tablet, Take 1 tablet (600 mg total) by mouth 2 (two) times daily as needed for up to 7 days for cough or to loosen phlegm., Disp: 14 tablet, Rfl: 0   predniSONE  (DELTASONE ) 20 MG tablet, Take 2 tablets (40 mg total) by mouth daily with breakfast for 3 days., Disp: 6 tablet, Rfl: 0   acetaminophen  (TYLENOL ) 500 MG tablet, Take 1,000 mg by mouth at bedtime as needed for moderate pain., Disp: , Rfl:    amoxicillin (AMOXIL) 500 MG capsule, Take 2,000 mg by mouth See admin instructions. Take 2000 mg 1 hour prior to dental work, Disp: , Rfl:    cetirizine (ZYRTEC) 10 MG tablet, Take 10 mg by mouth daily., Disp: , Rfl:    cholecalciferol (VITAMIN D3) 25 MCG (1000 UNIT) tablet, Take 2,000 Units by mouth daily., Disp: , Rfl:    fluticasone (FLONASE) 50 MCG/ACT nasal spray, Place 1 spray into both nostrils daily. , Disp: , Rfl:    furosemide  (LASIX ) 40 MG tablet, Take 1 tablet by mouth daily for three days in a row prn ., Disp: 30 tablet, Rfl: 0   hydrochlorothiazide  (HYDRODIURIL ) 25 MG tablet, TAKE 1 TABLET (25 MG TOTAL) BY MOUTH DAILY., Disp: 30 tablet, Rfl:  0   ibuprofen (ADVIL) 200 MG tablet, Take 400 mg by mouth at bedtime as needed for moderate pain., Disp: , Rfl:    Ketotifen Fumarate (ALLERGY EYE DROPS OP), Place 1 drop into both eyes daily as needed (allergies)., Disp: , Rfl:    lidocaine  (LMX) 4 % cream, Apply 1 application topically daily as needed (pain)., Disp: , Rfl:    lisinopril  (ZESTRIL ) 10 MG tablet, TAKE 2 TABLETS BY MOUTH EVERY DAY, Disp: 60 tablet, Rfl: 0   Magnesium 250 MG TABS, Take by mouth. (Patient not taking: Reported on 03/07/2023), Disp: , Rfl:    Multiple Vitamin (MULTIVITAMIN) tablet, Take 1 tablet by mouth daily., Disp: , Rfl:    potassium chloride  SA (KLOR-CON  M) 20 MEQ tablet, Take 1 tablet (20 mEq total) by mouth daily. Take with lasix , Disp: 30 tablet, Rfl: 0   Medications ordered in this encounter:  Meds ordered this encounter  Medications   azithromycin  (ZITHROMAX ) 250 MG tablet    Sig: Take 2 tablets on day 1, then 1 tablet daily on days 2 through 5    Dispense:  6 tablet    Refill:  0    Supervising Provider:   LAMPTEY, PHILIP O [[id number]]   guaiFENesin  (MUCINEX ) 600 MG 12 hr tablet    Sig: Take 1 tablet (600 mg total) by mouth 2 (two) times daily as needed for up  to 7 days for cough or to loosen phlegm.    Dispense:  14 tablet    Refill:  0    Supervising Provider:   LAMPTEY, PHILIP O [[id number]]   predniSONE  (DELTASONE ) 20 MG tablet    Sig: Take 2 tablets (40 mg total) by mouth daily with breakfast for 3 days.    Dispense:  6 tablet    Refill:  0    Supervising Provider:   LAMPTEY, PHILIP O [[id number]]     *If you need refills on other medications prior to your next appointment, please contact your pharmacy*  Follow-Up: Call back or seek an in-person evaluation if the symptoms worsen or if the condition fails to improve as anticipated.  [facility] [phone]  Other Instructions  - Increased rest - Increasing Fluids - Acetaminophen  / ibuprofen as needed for fever/pain.  - Salt  water  gargling, chloraseptic spray and throat lozenges - Mucinex  if mucus is present and increasing.  - Saline nasal spray if congestion or if nasal passages feel dry. - Humidifying the air.   -Follow up in person if not improving   If you have been instructed to have an in-person evaluation today at a local Urgent Care facility, please use the link below. It will take you to a list of all of our available [facility], including address, phone number and hours of operation. Please do not delay care.  [facility]  If you or a family member do not have a primary care provider, use the link below to schedule a visit and establish care. When you choose a [facility] primary care physician or advanced practice provider, you gain a long-term partner in health. Find a Primary Care Provider  Learn more about [facility] in-office and virtual care options:  - Get Care Now

## 2023-05-15 ENCOUNTER — Ambulatory Visit: Payer: HMO | Attending: Neurosurgery | Admitting: Rehabilitative and Restorative Service Providers"

## 2023-05-15 ENCOUNTER — Encounter: Payer: Self-pay | Admitting: Rehabilitative and Restorative Service Providers"

## 2023-05-15 DIAGNOSIS — R252 Cramp and spasm: Secondary | ICD-10-CM | POA: Insufficient documentation

## 2023-05-15 DIAGNOSIS — M6281 Muscle weakness (generalized): Secondary | ICD-10-CM | POA: Insufficient documentation

## 2023-05-15 DIAGNOSIS — M542 Cervicalgia: Secondary | ICD-10-CM | POA: Diagnosis not present

## 2023-05-15 DIAGNOSIS — R42 Dizziness and giddiness: Secondary | ICD-10-CM | POA: Diagnosis not present

## 2023-05-15 DIAGNOSIS — R2689 Other abnormalities of gait and mobility: Secondary | ICD-10-CM | POA: Insufficient documentation

## 2023-05-15 NOTE — Therapy (Signed)
 OUTPATIENT PHYSICAL THERAPY TREATMENT NOTE   Patient Name: James [name] MRN: [id number] DOB:[birth year], 68 y.o., male Today's Date: 05/15/2023    END OF SESSION:  PT End of Session - 05/15/23 1011     Visit Number 14    Date for PT Re-Evaluation 07/05/23    Authorization Type [employer]    Progress Note Due on Visit 23    PT Start Time 1008    PT Stop Time 1046    PT Time Calculation (min) 38 min    Activity Tolerance Patient tolerated treatment well    Behavior During Therapy WFL for tasks assessed/performed             Past Medical History:  Diagnosis Date   Arthritis    Bilateral swelling of feet    Chicken pox    Complication of anesthesia    difficulty waking up   Depression    Hypertension    Joint pain    Obesity    Osteoarthritis    Prediabetes    Shortness of breath    Swelling    feet and legs   Vitamin D  deficiency    Past Surgical History:  Procedure Laterality Date   cartilage removal     COLONOSCOPY WITH PROPOFOL  N/A 12/14/2019   Procedure: COLONOSCOPY WITH PROPOFOL ;  Surgeon: Legrand Victory LITTIE DOUGLAS, MD;  Location: [facility];  Service: Gastroenterology;  Laterality: N/A;   KNEE SURGERY  2000   POLYPECTOMY  12/14/2019   Procedure: POLYPECTOMY;  Surgeon: Legrand Victory LITTIE DOUGLAS, MD;  Location: [facility];  Service: Gastroenterology;;   TOTAL HIP ARTHROPLASTY Left 02/11/2019   Procedure: TOTAL HIP ARTHROPLASTY-POSTERIOR;  Surgeon: Melodi Lerner, MD;  Location: [facility];  Service: Orthopedics;  Laterality: Left;    Patient Active Problem List   Diagnosis Date Noted   Depression 09/08/2020   Special screening for malignant neoplasms, colon    Benign neoplasm of rectum    OA (osteoarthritis) of hip 02/11/2019   Other hyperlipidemia 04/10/2017   Vitamin D  deficiency 04/10/2017   Prediabetes 12/17/2016   Class 3 severe obesity with serious comorbidity and body mass index (BMI) of 50.0 to 59.9 in adult (HCC) 12/17/2016   At risk for  diabetes mellitus 10/02/2016   Hyperglycemia 09/04/2016   Severe obesity (BMI >= 40) (HCC) 01/15/2013   OBESITY, MORBID 01/20/2007   Essential hypertension 10/31/2006    PCP: Merna Huxley, NP  REFERRING PROVIDER: Louis Victory, MD  REFERRING DIAG: [phone] (ICD-10-CM) - Spinal stenosis, cervical region  THERAPY DIAG:  Cervicalgia  Muscle weakness (generalized)  Cramp and spasm  Other abnormalities of gait and mobility  Dizziness and giddiness  Rationale for Evaluation and Treatment: Rehabilitation  ONSET DATE: June 2024 pain started getting worse  SUBJECTIVE:  SUBJECTIVE STATEMENT: Patient reports that he has been using the TENS unit on his neck/shoulders and he is feeling better.  Patient denies any pain in his shoulders and cervical region today.  Patient with reports of 7/10 knee pain.  Hand dominance: Right  PERTINENT HISTORY:  pre-diabetes, HTN, cervical radiculopathy, OSA, vertigo  PAIN:  Are you having pain? Yes: NPRS scale: 7/10 Pain location: bilateral knees Pain description: sharp, tingling into bilateral hands Aggravating factors: certain positions (especially with lying on his side) Relieving factors: certain supportive positions  PRECAUTIONS: None  RED FLAGS: None     WEIGHT BEARING RESTRICTIONS: No  FALLS:  Has patient fallen in last 6 months? No  LIVING ENVIRONMENT: Lives with: lives with their spouse lives with wife who is primarily w/c bound and needs assistance with most daily activities Lives in: House/apartment Stairs:  2 story home, but they live on one level.  They have a platform lift to help get to driveway. Has following equipment at home: Single point cane and Grab bars  OCCUPATION: Retired  PLOF: Independent  PATIENT GOALS: To  avoid cervical surgery and some pain relief.  NEXT MD VISIT:  Dr Louis on 04/11/2023  OBJECTIVE:  Note: Objective measures were completed at Evaluation unless otherwise noted.  DIAGNOSTIC FINDINGS:   Brain MRI on 04/18/2023 IMPRESSION: 1. No evidence of an acute intracranial abnormality. 2. Mild chronic small vessel ischemic changes within the cerebral white matter. 3. Mild generalized cerebral atrophy. 4. Moderate-to-severe mucosal thickening within the right sphenoid sinus.   Cervical MRI on 12/12/2022: IMPRESSION: 1. Degenerative disc bulge with uncovertebral and facet hypertrophy at C5-6 with resultant mild canal, with severe right worse than left C6 foraminal stenosis. Reactive endplate changes with marrow edema at this level could contribute to neck pain. 2. Mild left C7 foraminal narrowing related to disc bulge and uncovertebral disease. 3. Additional mild spondylosis elsewhere within the cervical spine as above without significant stenosis or impingement.  Lumbar MRI on 12/12/2022: IMPRESSION: 1. Mild spinal and bilateral lateral recess stenosis at L4-5. There is also a shallow broad-based left foraminal disc protrusion potentially irritating the left nerve root. 2. Mild bilateral foraminal encroachment at L3-4. 3. Advanced facet disease at L5-S1 but no spinal or foraminal stenosis.  PATIENT SURVEYS:  Eval:  NDI 32 / 50 = 64.0 % 04/15/2023:  Neck Disability Index score: 32 / 50 = 64.0 % 04/29/2023:  Neck Disability Index score: 27 / 50 = 54.0 %  COGNITION: Overall cognitive status: Within functional limits for tasks assessed  SENSATION: Pt reports tingling down bilateral UE  POSTURE: rounded shoulders, forward head, and flexed trunk   PALPATION: Tenderness to palpation with muscle spasms noted   CERVICAL ROM:   Active ROM A/ROM (deg) eval A/ROM (Deg) 04/15/23 A/ROM (Deg) 04/29/23  Flexion 55 45 55  Extension 25 27 25   Right lateral flexion 30 with pain 35 35   Left lateral flexion 35 35 35  Right rotation 48 55 55  Left rotation 55 55 65   (Blank rows = not tested)  UPPER EXTREMITY ROM:  Eval:  WFL  UPPER EXTREMITY MMT:  Eval:   Right shoulder strength grossly 4-/5 Left is [facility]  CERVICAL SPECIAL TESTS:  Eval:  Distraction test: decrease in pain  FUNCTIONAL TESTS:  Eval: 5 times sit to stand: 15.92 sec Timed up and go (TUG): 15.12 sec  04/29/2023: 5 times sit to stand:  16.74 sec  TODAY'S TREATMENT:  DATE:  05/15/2023 Nustep level 1 x3 min with PT present to discuss status Cervical SNAGs for rotation x20 bilat Cervical SNAGs for extension x20 Seated shoulder horizontal abduction with green tband 2x10 Seated shoulder shoulder ER with green tband 2x10 Seated shoulder D2 with green tband 2x10 Seated shoulder punches with 2# 2x10 Seated shoulder flexion, scaption, and abduction with 2# 2x10 each bilat Seated core series with 2# dumbbell:  hip to hip, ear to ear.  X10 each Seated blue pball rollout 2x10 Seated lateral overhead lean over blue peanut ball x10 bilat   04/29/2023 Nustep level 1 x3 min with PT present to discuss status Cervical SNAGs for rotation x20 bilat Cervical SNAGs for extension x20 Seated shoulder horizontal abduction with red tband 2x10 Seated shoulder shoulder ER with red tband 2x10 Seated shoulder D2 with red tband 2x10 Neck Disability Index, cervical A/ROM Seated shoulder punches with 2# 2x10 Seated shoulder flexion, scaption, and abduction with 2# 2x10 each bilat E-stim:  IFC to bilateral upper traps at normal settings with intensity adjusted to tolerance x15 min Moist heat pack applied concurrently with e-stim   04/24/2023 Cervical SNAGs for rotation x20 bilat Cervical SNAGs for extension x20 Seated shoulder horizontal abduction with red tband 2x10 Seated shoulder shoulder  ER with red tband 2x10 Seated shoulder D2 with red tband 2x10 Seated shoulder rows and extension with green tband x20 each bilat Seated shoulder punches with 2# 2x10 Seated shoulder flexion, scaption, and abduction with 2# 2x10 each bilat E-stim:  IFC to bilateral upper traps at normal settings with intensity adjusted to tolerance x15 min Moist heat pack applied concurrently with e-stim    PATIENT EDUCATION:  Education details: Issued HEP Person educated: Patient Education method: Explanation, Facilities Manager, and Handouts Education comprehension: verbalized understanding  HOME EXERCISE PROGRAM: Access Code: [email or web address] URL: [email or web address] Date: 03/07/2023 Prepared by: Jarrell Kristal Perl  Exercises - Seated Scapular Retraction  - 1 x daily - 7 x weekly - 2 sets - 10 reps - Seated Cervical Retraction  - 1 x daily - 7 x weekly - 2 sets - 10 reps - Seated Assisted Cervical Rotation with Towel  - 1 x daily - 7 x weekly - 1-2 sets - 10 reps - Mid-Lower Cervical Extension SNAG with Strap  - 1 x daily - 7 x weekly - 1-2 sets - 10 reps  ASSESSMENT:  CLINICAL IMPRESSION: Mr Marasigan presents to PT reporting that since he got a TENS unit for home, he is feeling significantly better.  Patient denies any pain in his cervical and shoulder region.  Patient continues to be limited with standing exercises secondary to knee pain.  Patient able to add in core strengthening during session today.  Patient continues to require skilled PT to progress with goal related activities.   OBJECTIVE IMPAIRMENTS: decreased mobility, decreased ROM, dizziness, impaired perceived functional ability, increased muscle spasms, impaired UE functional use, postural dysfunction, and pain.   ACTIVITY LIMITATIONS: carrying, lifting, sleeping, and transfers  PARTICIPATION LIMITATIONS: cleaning, laundry, driving, and community activity  PERSONAL FACTORS: Past/current experiences, Time since onset of  injury/illness/exacerbation, and 3+ comorbidities: pre-diabetes, carpal tunnel, OA  are also affecting patient's functional outcome.   REHAB POTENTIAL: Good  CLINICAL DECISION MAKING: Evolving/moderate complexity  EVALUATION COMPLEXITY: Moderate   GOALS: Goals reviewed with patient? Yes  SHORT TERM GOALS: Target date: 03/22/2023  Pt will be independent with initial HEP. Baseline:  Goal status: Met on 03/18/23  2.  Patient will report at least a 25% improvement in symptoms  since starting PT. Baseline:  Goal status: Met on 03/27/2023   LONG TERM GOALS: Target date: 07/05/2023  Patient will be independent with advanced HEP. Baseline:  Goal status: Ongoing  2.  Patient will improve Neck Disability Index to no greater than 50% to demonstrate improvements in functional tasks. Baseline: 64% Goal status: Ongoing (see above)  3.  Patient will increase cervical A/ROM to [facility] without increased pain or dizziness to allow for improved ease with driving. Baseline:  Goal status: Met on 04/29/2023  4.  Patient will increase right shoulder strength to at least 4/5 to allow him to be able to perform functional tasks with increased ease. Baseline:  Goal status: Ongoing  5.  Patient will report at least a 50% improvement in pain symptoms since starting PT. Baseline:  Goal status: Met on 03/27/2023 (following cervical injection)    PLAN:  PT FREQUENCY: 2x/week  PT DURATION: 8 weeks  PLANNED INTERVENTIONS: 97164- PT Re-evaluation, 97110-Therapeutic exercises, 97530- Therapeutic activity, 97112- Neuromuscular re-education, 97535- Self Care, 02859- Manual therapy, [phone]- Canalith repositioning, [id number]- Aquatic Therapy, 97014- Electrical stimulation (unattended), [id number]- Electrical stimulation (manual), [id number]- Ultrasound, 02987- Traction (mechanical), [id number]- Ionotophoresis 4mg /ml Dexamethasone , Patient/Family education, Balance training, Taping, Dry Needling, Joint mobilization, Joint  manipulation, Spinal manipulation, Spinal mobilization, Cryotherapy, and Moist heat  PLAN FOR NEXT SESSION: Assess and progress HEP as indicated, strengthening, ROM, manual/dry needling as indicated, assess response to e-stim   Jarrell Laming, PT, DPT 05/15/23, 10:55 AM  [facility] [address], [address] [place], [place] [address] Phone # [phone] Fax [phone]

## 2023-05-17 ENCOUNTER — Encounter: Payer: Self-pay | Admitting: Rehabilitative and Restorative Service Providers"

## 2023-05-17 ENCOUNTER — Ambulatory Visit: Payer: HMO | Admitting: Rehabilitative and Restorative Service Providers"

## 2023-05-17 DIAGNOSIS — R42 Dizziness and giddiness: Secondary | ICD-10-CM

## 2023-05-17 DIAGNOSIS — M6281 Muscle weakness (generalized): Secondary | ICD-10-CM

## 2023-05-17 DIAGNOSIS — R252 Cramp and spasm: Secondary | ICD-10-CM

## 2023-05-17 DIAGNOSIS — M542 Cervicalgia: Secondary | ICD-10-CM

## 2023-05-17 DIAGNOSIS — R2689 Other abnormalities of gait and mobility: Secondary | ICD-10-CM

## 2023-05-17 NOTE — Therapy (Signed)
 OUTPATIENT PHYSICAL THERAPY TREATMENT NOTE   Patient Name: James [name] MRN: [id number] DOB:10-Apr-1956, 68 y.o., male Today's Date: 05/17/2023    END OF SESSION:  PT End of Session - 05/17/23 0938     Visit Number 15    Date for PT Re-Evaluation 07/05/23    Authorization Type [employer]    Progress Note Due on Visit 23    PT Start Time 0930    PT Stop Time 1010    PT Time Calculation (min) 40 min    Activity Tolerance Patient tolerated treatment well    Behavior During Therapy WFL for tasks assessed/performed             Past Medical History:  Diagnosis Date   Arthritis    Bilateral swelling of feet    Chicken pox    Complication of anesthesia    difficulty waking up   Depression    Hypertension    Joint pain    Obesity    Osteoarthritis    Prediabetes    Shortness of breath    Swelling    feet and legs   Vitamin D  deficiency    Past Surgical History:  Procedure Laterality Date   cartilage removal     COLONOSCOPY WITH PROPOFOL  N/A 12/14/2019   Procedure: COLONOSCOPY WITH PROPOFOL ;  Surgeon: Legrand Victory LITTIE DOUGLAS, MD;  Location: [facility];  Service: Gastroenterology;  Laterality: N/A;   KNEE SURGERY  2000   POLYPECTOMY  12/14/2019   Procedure: POLYPECTOMY;  Surgeon: Legrand Victory LITTIE DOUGLAS, MD;  Location: [facility];  Service: Gastroenterology;;   TOTAL HIP ARTHROPLASTY Left 02/11/2019   Procedure: TOTAL HIP ARTHROPLASTY-POSTERIOR;  Surgeon: Melodi Lerner, MD;  Location: [facility];  Service: Orthopedics;  Laterality: Left;    Patient Active Problem List   Diagnosis Date Noted   Depression 09/08/2020   Special screening for malignant neoplasms, colon    Benign neoplasm of rectum    OA (osteoarthritis) of hip 02/11/2019   Other hyperlipidemia 04/10/2017   Vitamin D  deficiency 04/10/2017   Prediabetes 12/17/2016   Class 3 severe obesity with serious comorbidity and body mass index (BMI) of 50.0 to 59.9 in adult (HCC) 12/17/2016   At risk for  diabetes mellitus 10/02/2016   Hyperglycemia 09/04/2016   Severe obesity (BMI >= 40) (HCC) 01/15/2013   OBESITY, MORBID 01/20/2007   Essential hypertension 10/31/2006    PCP: Merna Huxley, NP  REFERRING PROVIDER: Louis Victory, MD  REFERRING DIAG: [phone] (ICD-10-CM) - Spinal stenosis, cervical region  THERAPY DIAG:  Cervicalgia  Muscle weakness (generalized)  Cramp and spasm  Other abnormalities of gait and mobility  Dizziness and giddiness  Rationale for Evaluation and Treatment: Rehabilitation  ONSET DATE: June 2024 pain started getting worse  SUBJECTIVE:  SUBJECTIVE STATEMENT: Patient continues to report that knee pain is limiting him.  States shoulder pain is 2/10 today.  Hand dominance: Right  PERTINENT HISTORY:  pre-diabetes, HTN, cervical radiculopathy, OSA, vertigo  PAIN:  Are you having pain? Yes: NPRS scale: 2/10 Pain location: shoulders Pain description: sharp, tingling into bilateral hands Aggravating factors: certain positions (especially with lying on his side) Relieving factors: certain supportive positions  PRECAUTIONS: None  RED FLAGS: None     WEIGHT BEARING RESTRICTIONS: No  FALLS:  Has patient fallen in last 6 months? No  LIVING ENVIRONMENT: Lives with: lives with their spouse lives with wife who is primarily w/c bound and needs assistance with most daily activities Lives in: House/apartment Stairs:  2 story home, but they live on one level.  They have a platform lift to help get to driveway. Has following equipment at home: Single point cane and Grab bars  OCCUPATION: Retired  PLOF: Independent  PATIENT GOALS: To avoid cervical surgery and some pain relief.  NEXT MD VISIT:  Dr Louis on 04/11/2023  OBJECTIVE:  Note: Objective  measures were completed at Evaluation unless otherwise noted.  DIAGNOSTIC FINDINGS:   Brain MRI on 04/18/2023 IMPRESSION: 1. No evidence of an acute intracranial abnormality. 2. Mild chronic small vessel ischemic changes within the cerebral white matter. 3. Mild generalized cerebral atrophy. 4. Moderate-to-severe mucosal thickening within the right sphenoid sinus.   Cervical MRI on 12/12/2022: IMPRESSION: 1. Degenerative disc bulge with uncovertebral and facet hypertrophy at C5-6 with resultant mild canal, with severe right worse than left C6 foraminal stenosis. Reactive endplate changes with marrow edema at this level could contribute to neck pain. 2. Mild left C7 foraminal narrowing related to disc bulge and uncovertebral disease. 3. Additional mild spondylosis elsewhere within the cervical spine as above without significant stenosis or impingement.  Lumbar MRI on 12/12/2022: IMPRESSION: 1. Mild spinal and bilateral lateral recess stenosis at L4-5. There is also a shallow broad-based left foraminal disc protrusion potentially irritating the left nerve root. 2. Mild bilateral foraminal encroachment at L3-4. 3. Advanced facet disease at L5-S1 but no spinal or foraminal stenosis.  PATIENT SURVEYS:  Eval:  NDI 32 / 50 = 64.0 % 04/15/2023:  Neck Disability Index score: 32 / 50 = 64.0 % 04/29/2023:  Neck Disability Index score: 27 / 50 = 54.0 %  COGNITION: Overall cognitive status: Within functional limits for tasks assessed  SENSATION: Pt reports tingling down bilateral UE  POSTURE: rounded shoulders, forward head, and flexed trunk   PALPATION: Tenderness to palpation with muscle spasms noted   CERVICAL ROM:   Active ROM A/ROM (deg) eval A/ROM (Deg) 04/15/23 A/ROM (Deg) 04/29/23  Flexion 55 45 55  Extension 25 27 25   Right lateral flexion 30 with pain 35 35  Left lateral flexion 35 35 35  Right rotation 48 55 55  Left rotation 55 55 65   (Blank rows = not tested)  UPPER  EXTREMITY ROM:  Eval:  WFL  UPPER EXTREMITY MMT:  Eval:   Right shoulder strength grossly 4-/5 Left is [facility]  CERVICAL SPECIAL TESTS:  Eval:  Distraction test: decrease in pain  FUNCTIONAL TESTS:  Eval: 5 times sit to stand: 15.92 sec Timed up and go (TUG): 15.12 sec  04/29/2023: 5 times sit to stand:  16.74 sec  TODAY'S TREATMENT:  DATE:  05/17/2023 Nustep level 1 x3 min with PT present to discuss status Seated shoulder horizontal abduction with green tband 2x10 Seated shoulder shoulder ER with green tband 2x10 Seated shoulder D2 with green tband 2x10 Seated shoulder punches with 2# 2x10 Seated shoulder flexion, scaption, and abduction with 2# 2x10 each bilat Bicep curls with 5# dumbbells 2x10 Seated blue pball rollout 2x10 Cervical SNAGs for rotation x20 bilat Cervical SNAGs for extension x20 Ultrasound to cervical and upper trap region:  3.3 MHz, 2.3 W/cm2, x8 minutes   05/15/2023 Nustep level 1 x3 min with PT present to discuss status Cervical SNAGs for rotation x20 bilat Cervical SNAGs for extension x20 Seated shoulder horizontal abduction with green tband 2x10 Seated shoulder shoulder ER with green tband 2x10 Seated shoulder D2 with green tband 2x10 Seated shoulder punches with 2# 2x10 Seated shoulder flexion, scaption, and abduction with 2# 2x10 each bilat Seated core series with 2# dumbbell:  hip to hip, ear to ear.  X10 each Seated blue pball rollout 2x10 Seated lateral overhead lean over blue peanut ball x10 bilat   04/29/2023 Nustep level 1 x3 min with PT present to discuss status Cervical SNAGs for rotation x20 bilat Cervical SNAGs for extension x20 Seated shoulder horizontal abduction with red tband 2x10 Seated shoulder shoulder ER with red tband 2x10 Seated shoulder D2 with red tband 2x10 Neck Disability Index, cervical  A/ROM Seated shoulder punches with 2# 2x10 Seated shoulder flexion, scaption, and abduction with 2# 2x10 each bilat E-stim:  IFC to bilateral upper traps at normal settings with intensity adjusted to tolerance x15 min Moist heat pack applied concurrently with e-stim    PATIENT EDUCATION:  Education details: Issued HEP Person educated: Patient Education method: Explanation, Facilities Manager, and Handouts Education comprehension: verbalized understanding  HOME EXERCISE PROGRAM: Access Code: [email or web address] URL: [email or web address] Date: 03/07/2023 Prepared by: Jarrell Laming  Exercises - Seated Scapular Retraction  - 1 x daily - 7 x weekly - 2 sets - 10 reps - Seated Cervical Retraction  - 1 x daily - 7 x weekly - 2 sets - 10 reps - Seated Assisted Cervical Rotation with Towel  - 1 x daily - 7 x weekly - 1-2 sets - 10 reps - Mid-Lower Cervical Extension SNAG with Strap  - 1 x daily - 7 x weekly - 1-2 sets - 10 reps  ASSESSMENT:  CLINICAL IMPRESSION: Mr Manolis presents to PT reporting that he is having some extra shoulder pain this morning, but overall the pain is a 2/10.  Patient continues to be able to perform seated exercises during PT session with minimal cuing.  Patient was agreeable to using ultrasound again to assist with decreased pain in his shoulder region.  Patient continues to require skilled PT to progress with goal related activities.   OBJECTIVE IMPAIRMENTS: decreased mobility, decreased ROM, dizziness, impaired perceived functional ability, increased muscle spasms, impaired UE functional use, postural dysfunction, and pain.   ACTIVITY LIMITATIONS: carrying, lifting, sleeping, and transfers  PARTICIPATION LIMITATIONS: cleaning, laundry, driving, and community activity  PERSONAL FACTORS: Past/current experiences, Time since onset of injury/illness/exacerbation, and 3+ comorbidities: pre-diabetes, carpal tunnel, OA  are also affecting patient's functional  outcome.   REHAB POTENTIAL: Good  CLINICAL DECISION MAKING: Evolving/moderate complexity  EVALUATION COMPLEXITY: Moderate   GOALS: Goals reviewed with patient? Yes  SHORT TERM GOALS: Target date: 03/22/2023  Pt will be independent with initial HEP. Baseline:  Goal status: Met on 03/18/23  2.  Patient will report at least a 25% improvement  in symptoms since starting PT. Baseline:  Goal status: Met on 03/27/2023   LONG TERM GOALS: Target date: 07/05/2023  Patient will be independent with advanced HEP. Baseline:  Goal status: Ongoing  2.  Patient will improve Neck Disability Index to no greater than 50% to demonstrate improvements in functional tasks. Baseline: 64% Goal status: Ongoing (see above)  3.  Patient will increase cervical A/ROM to [facility] without increased pain or dizziness to allow for improved ease with driving. Baseline:  Goal status: Met on 04/29/2023  4.  Patient will increase right shoulder strength to at least 4/5 to allow him to be able to perform functional tasks with increased ease. Baseline:  Goal status: Ongoing  5.  Patient will report at least a 50% improvement in pain symptoms since starting PT. Baseline:  Goal status: Met on 03/27/2023 (following cervical injection)    PLAN:  PT FREQUENCY: 2x/week  PT DURATION: 8 weeks  PLANNED INTERVENTIONS: 97164- PT Re-evaluation, 97110-Therapeutic exercises, 97530- Therapeutic activity, 97112- Neuromuscular re-education, 97535- Self Care, 02859- Manual therapy, [phone]- Canalith repositioning, [id number]- Aquatic Therapy, 97014- Electrical stimulation (unattended), [id number]- Electrical stimulation (manual), [id number]- Ultrasound, 02987- Traction (mechanical), [id number]- Ionotophoresis 4mg /ml Dexamethasone , Patient/Family education, Balance training, Taping, Dry Needling, Joint mobilization, Joint manipulation, Spinal manipulation, Spinal mobilization, Cryotherapy, and Moist heat  PLAN FOR NEXT SESSION: Assess and progress  HEP as indicated, strengthening, ROM, manual/dry needling as indicated, assess response to e-stim   Jarrell Laming, PT, DPT 05/17/23, 10:26 AM  [facility] [address], [address] [place], [place] [address] Phone # [phone] Fax [phone]

## 2023-05-20 ENCOUNTER — Encounter: Payer: HMO | Admitting: Rehabilitative and Restorative Service Providers"

## 2023-05-22 ENCOUNTER — Encounter: Payer: Self-pay | Admitting: Rehabilitative and Restorative Service Providers"

## 2023-05-22 ENCOUNTER — Ambulatory Visit: Payer: HMO | Admitting: Rehabilitative and Restorative Service Providers"

## 2023-05-22 DIAGNOSIS — R2689 Other abnormalities of gait and mobility: Secondary | ICD-10-CM

## 2023-05-22 DIAGNOSIS — M542 Cervicalgia: Secondary | ICD-10-CM

## 2023-05-22 DIAGNOSIS — M6281 Muscle weakness (generalized): Secondary | ICD-10-CM

## 2023-05-22 DIAGNOSIS — R252 Cramp and spasm: Secondary | ICD-10-CM

## 2023-05-22 NOTE — Therapy (Signed)
 OUTPATIENT PHYSICAL THERAPY TREATMENT NOTE   Patient Name: James [name] MRN: [id number] DOB:21-Sep-1955, 68 y.o., male Today's Date: 05/22/2023    END OF SESSION:  PT End of Session - 05/22/23 0924     Visit Number 16    Date for PT Re-Evaluation 07/05/23    Authorization Type [employer]    Progress Note Due on Visit 23    PT Start Time 0921    PT Stop Time 1000    PT Time Calculation (min) 39 min    Activity Tolerance Patient tolerated treatment well    Behavior During Therapy [facility] for tasks assessed/performed             Past Medical History:  Diagnosis Date   Arthritis    Bilateral swelling of feet    Chicken pox    Complication of anesthesia    difficulty waking up   Depression    Hypertension    Joint pain    Obesity    Osteoarthritis    Prediabetes    Shortness of breath    Swelling    feet and legs   Vitamin D  deficiency    Past Surgical History:  Procedure Laterality Date   cartilage removal     COLONOSCOPY WITH PROPOFOL  N/A 12/14/2019   Procedure: COLONOSCOPY WITH PROPOFOL ;  Surgeon: Albertina Hugger, MD;  Location: [facility];  Service: Gastroenterology;  Laterality: N/A;   KNEE SURGERY  2000   POLYPECTOMY  12/14/2019   Procedure: POLYPECTOMY;  Surgeon: Albertina Hugger, MD;  Location: [facility];  Service: Gastroenterology;;   TOTAL HIP ARTHROPLASTY Left 02/11/2019   Procedure: TOTAL HIP ARTHROPLASTY-POSTERIOR;  Surgeon: Liliane Rei, MD;  Location: [facility];  Service: Orthopedics;  Laterality: Left;    Patient Active Problem List   Diagnosis Date Noted   Depression 09/08/2020   Special screening for malignant neoplasms, colon    Benign neoplasm of rectum    OA (osteoarthritis) of hip 02/11/2019   Other hyperlipidemia 04/10/2017   Vitamin D  deficiency 04/10/2017   Prediabetes 12/17/2016   Class 3 severe obesity with serious comorbidity and body mass index (BMI) of 50.0 to 59.9 in adult (HCC) 12/17/2016   At risk for  diabetes mellitus 10/02/2016   Hyperglycemia 09/04/2016   Severe obesity (BMI >= 40) (HCC) 01/15/2013   OBESITY, MORBID 01/20/2007   Essential hypertension 10/31/2006    PCP: Alto Atta, NP  REFERRING PROVIDER: Agustina Aldrich, MD  REFERRING DIAG: [phone] (ICD-10-CM) - Spinal stenosis, cervical region  THERAPY DIAG:  Cervicalgia  Muscle weakness (generalized)  Cramp and spasm  Other abnormalities of gait and mobility  Rationale for Evaluation and Treatment: Rehabilitation  ONSET DATE: June 2024 pain started getting worse  SUBJECTIVE:  SUBJECTIVE STATEMENT: Patient reports no new complaints.  Hand dominance: Right  PERTINENT HISTORY:  pre-diabetes, HTN, cervical radiculopathy, OSA, vertigo  PAIN:  Are you having pain? Yes: NPRS scale: 2/10 Pain location: shoulders Pain description: sharp, tingling into bilateral hands Aggravating factors: certain positions (especially with lying on his side) Relieving factors: certain supportive positions  PRECAUTIONS: None  RED FLAGS: None     WEIGHT BEARING RESTRICTIONS: No  FALLS:  Has patient fallen in last 6 months? No  LIVING ENVIRONMENT: Lives with: lives with their spouse lives with wife who is primarily w/c bound and needs assistance with most daily activities Lives in: House/apartment Stairs:  2 story home, but they live on one level.  They have a platform lift to help get to driveway. Has following equipment at home: Single point cane and Grab bars  OCCUPATION: Retired  PLOF: Independent  PATIENT GOALS: To avoid cervical surgery and some pain relief.  NEXT MD VISIT:  As needed  OBJECTIVE:  Note: Objective measures were completed at Evaluation unless otherwise noted.  DIAGNOSTIC FINDINGS:   Brain MRI on  04/18/2023 IMPRESSION: 1. No evidence of an acute intracranial abnormality. 2. Mild chronic small vessel ischemic changes within the cerebral white matter. 3. Mild generalized cerebral atrophy. 4. Moderate-to-severe mucosal thickening within the right sphenoid sinus.   Cervical MRI on 12/12/2022: IMPRESSION: 1. Degenerative disc bulge with uncovertebral and facet hypertrophy at C5-6 with resultant mild canal, with severe right worse than left C6 foraminal stenosis. Reactive endplate changes with marrow edema at this level could contribute to neck pain. 2. Mild left C7 foraminal narrowing related to disc bulge and uncovertebral disease. 3. Additional mild spondylosis elsewhere within the cervical spine as above without significant stenosis or impingement.  Lumbar MRI on 12/12/2022: IMPRESSION: 1. Mild spinal and bilateral lateral recess stenosis at L4-5. There is also a shallow broad-based left foraminal disc protrusion potentially irritating the left nerve root. 2. Mild bilateral foraminal encroachment at L3-4. 3. Advanced facet disease at L5-S1 but no spinal or foraminal stenosis.  PATIENT SURVEYS:  Eval:  NDI 32 / 50 = 64.0 % 04/15/2023:  Neck Disability Index score: 32 / 50 = 64.0 % 04/29/2023:  Neck Disability Index score: 27 / 50 = 54.0 %  COGNITION: Overall cognitive status: Within functional limits for tasks assessed  SENSATION: Pt reports tingling down bilateral UE  POSTURE: rounded shoulders, forward head, and flexed trunk   PALPATION: Tenderness to palpation with muscle spasms noted   CERVICAL ROM:   Active ROM A/ROM (deg) eval A/ROM (Deg) 04/15/23 A/ROM (Deg) 04/29/23  Flexion 55 45 55  Extension 25 27 25   Right lateral flexion 30 with pain 35 35  Left lateral flexion 35 35 35  Right rotation 48 55 55  Left rotation 55 55 65   (Blank rows = not tested)  UPPER EXTREMITY ROM:  Eval:  WFL  UPPER EXTREMITY MMT:  Eval:   Right shoulder strength grossly  4-/5 Left is [facility]  CERVICAL SPECIAL TESTS:  Eval:  Distraction test: decrease in pain  FUNCTIONAL TESTS:  Eval: 5 times sit to stand: 15.92 sec Timed up and go (TUG): 15.12 sec  04/29/2023: 5 times sit to stand:  16.74 sec  TODAY'S TREATMENT:  DATE:  05/22/2023 Nustep level 1 x3 min with PT present to discuss status Cervical SNAGs for rotation x20 bilat Cervical SNAGs for extension x20 Seated blue pball rollout 2x10 Seated shoulder punches with 2# 2x10 Seated shoulder flexion, scaption, and abduction with 2# 2x10 each bilat Seated shoulder horizontal abduction with green tband 2x10 Seated shoulder shoulder ER with green tband 2x10 Seated shoulder D2 with green tband 2x10 Seated shoulder rows and shoulder extension with green tband 2x10 each Seated lateral overhead lean over blue peanut ball x10 bilat Seated upper trap stretch 2x20 sec bilat Seated levator stretch 2x20 sec bilat Seated transversus abdominus hold 2x10   05/17/2023 Nustep level 1 x3 min with PT present to discuss status Seated shoulder horizontal abduction with green tband 2x10 Seated shoulder shoulder ER with green tband 2x10 Seated shoulder D2 with green tband 2x10 Seated shoulder punches with 2# 2x10 Seated shoulder flexion, scaption, and abduction with 2# 2x10 each bilat Bicep curls with 5# dumbbells 2x10 Seated blue pball rollout 2x10 Cervical SNAGs for rotation x20 bilat Cervical SNAGs for extension x20 Ultrasound to cervical and upper trap region:  3.3 MHz, 2.3 W/cm2, x8 minutes   05/15/2023 Nustep level 1 x3 min with PT present to discuss status Cervical SNAGs for rotation x20 bilat Cervical SNAGs for extension x20 Seated shoulder horizontal abduction with green tband 2x10 Seated shoulder shoulder ER with green tband 2x10 Seated shoulder D2 with green tband 2x10 Seated  shoulder punches with 2# 2x10 Seated shoulder flexion, scaption, and abduction with 2# 2x10 each bilat Seated core series with 2# dumbbell:  hip to hip, ear to ear.  X10 each Seated blue pball rollout 2x10 Seated lateral overhead lean over blue peanut ball x10 bilat    PATIENT EDUCATION:  Education details: Issued HEP Person educated: Patient Education method: Explanation, Facilities manager, and Handouts Education comprehension: verbalized understanding  HOME EXERCISE PROGRAM: Access Code: [email or web address] URL: [email or web address] Date: 05/22/2023 Prepared by: Chaneta Comer Ranulfo Kall  Exercises - Seated Scapular Retraction  - 1 x daily - 7 x weekly - 2 sets - 10 reps - Seated Cervical Retraction  - 1 x daily - 7 x weekly - 2 sets - 10 reps - Seated Assisted Cervical Rotation with Towel  - 1 x daily - 7 x weekly - 1-2 sets - 10 reps - Mid-Lower Cervical Extension SNAG with Strap  - 1 x daily - 7 x weekly - 1-2 sets - 10 reps - Shoulder External Rotation and Scapular Retraction with Resistance  - 1 x daily - 7 x weekly - 2 sets - 10 reps - Standing Shoulder Horizontal Abduction with Resistance  - 1 x daily - 7 x weekly - 2 sets - 10 reps - Standing Shoulder Diagonal Horizontal Abduction 60/120 Degrees with Resistance  - 1 x daily - 7 x weekly - 2 sets - 10 reps  ASSESSMENT:  CLINICAL IMPRESSION: Mr Hopp presents to PT reporting that he did not seem to have any noticeable difference with use of ultrasound and that his TENS unit seems to be the most helpful.  Patient continues to be able to perform seated exercises secondary to the pain in his knees limiting him from standing exercises.  Patient able to add in transversus abdominus contraction to session today.  Updated HEP and patient states that he has a green tband at home already.  Patient continues to require skilled PT to progress towards goal related activities.   OBJECTIVE IMPAIRMENTS: decreased mobility, decreased ROM,  dizziness, impaired perceived functional  ability, increased muscle spasms, impaired UE functional use, postural dysfunction, and pain.   ACTIVITY LIMITATIONS: carrying, lifting, sleeping, and transfers  PARTICIPATION LIMITATIONS: cleaning, laundry, driving, and community activity  PERSONAL FACTORS: Past/current experiences, Time since onset of injury/illness/exacerbation, and 3+ comorbidities: pre-diabetes, carpal tunnel, OA  are also affecting patient's functional outcome.   REHAB POTENTIAL: Good  CLINICAL DECISION MAKING: Evolving/moderate complexity  EVALUATION COMPLEXITY: Moderate   GOALS: Goals reviewed with patient? Yes  SHORT TERM GOALS: Target date: 03/22/2023  Pt will be independent with initial HEP. Baseline:  Goal status: Met on 03/18/23  2.  Patient will report at least a 25% improvement in symptoms since starting PT. Baseline:  Goal status: Met on 03/27/2023   LONG TERM GOALS: Target date: 07/05/2023  Patient will be independent with advanced HEP. Baseline:  Goal status: Ongoing  2.  Patient will improve Neck Disability Index to no greater than 50% to demonstrate improvements in functional tasks. Baseline: 64% Goal status: Ongoing (see above)  3.  Patient will increase cervical A/ROM to [facility] without increased pain or dizziness to allow for improved ease with driving. Baseline:  Goal status: Met on 04/29/2023  4.  Patient will increase right shoulder strength to at least 4/5 to allow him to be able to perform functional tasks with increased ease. Baseline:  Goal status: Ongoing  5.  Patient will report at least a 50% improvement in pain symptoms since starting PT. Baseline:  Goal status: Met on 03/27/2023 (following cervical injection)    PLAN:  PT FREQUENCY: 2x/week  PT DURATION: 8 weeks  PLANNED INTERVENTIONS: 97164- PT Re-evaluation, 97110-Therapeutic exercises, 97530- Therapeutic activity, 97112- Neuromuscular re-education, 97535- Self Care,  97140- Manual therapy, [phone]- Canalith repositioning, 13244- Aquatic Therapy, 97014- Electrical stimulation (unattended), [id number]- Electrical stimulation (manual), [id number]- Ultrasound, 01027- Traction (mechanical), [id number]- Ionotophoresis 4mg /ml Dexamethasone , Patient/Family education, Balance training, Taping, Dry Needling, Joint mobilization, Joint manipulation, Spinal manipulation, Spinal mobilization, Cryotherapy, and Moist heat  PLAN FOR NEXT SESSION: Assess and progress HEP as indicated, strengthening, ROM, manual/dry needling as indicated, assess response to e-stim   Robyne Christen, PT, DPT 05/22/23, 10:08 AM  [facility] [address], [address] [place], [place] [address] Phone # [phone] Fax [phone]

## 2023-05-26 ENCOUNTER — Other Ambulatory Visit: Payer: Self-pay | Admitting: Adult Health

## 2023-05-26 DIAGNOSIS — I10 Essential (primary) hypertension: Secondary | ICD-10-CM

## 2023-05-27 ENCOUNTER — Encounter: Payer: HMO | Admitting: Rehabilitative and Restorative Service Providers"

## 2023-05-29 ENCOUNTER — Ambulatory Visit: Payer: HMO | Admitting: Rehabilitative and Restorative Service Providers"

## 2023-05-31 DIAGNOSIS — G4733 Obstructive sleep apnea (adult) (pediatric): Secondary | ICD-10-CM | POA: Diagnosis not present

## 2023-06-03 ENCOUNTER — Encounter: Payer: Self-pay | Admitting: Rehabilitative and Restorative Service Providers"

## 2023-06-03 ENCOUNTER — Ambulatory Visit: Payer: HMO | Admitting: Rehabilitative and Restorative Service Providers"

## 2023-06-03 DIAGNOSIS — R42 Dizziness and giddiness: Secondary | ICD-10-CM

## 2023-06-03 DIAGNOSIS — R2689 Other abnormalities of gait and mobility: Secondary | ICD-10-CM

## 2023-06-03 DIAGNOSIS — M6281 Muscle weakness (generalized): Secondary | ICD-10-CM

## 2023-06-03 DIAGNOSIS — R252 Cramp and spasm: Secondary | ICD-10-CM

## 2023-06-03 DIAGNOSIS — M542 Cervicalgia: Secondary | ICD-10-CM | POA: Diagnosis not present

## 2023-06-03 NOTE — Therapy (Signed)
OUTPATIENT PHYSICAL THERAPY TREATMENT NOTE   Patient Name: James [name] MRN: [id number] DOB:[birth year], 68 y.o., male Today's Date: 06/03/2023    END OF SESSION:  PT End of Session - 06/03/23 0932     Visit Number 17    Date for PT Re-Evaluation 07/05/23    Authorization Type [employer]    Progress Note Due on Visit 23    PT Start Time 0930    PT Stop Time 1010    PT Time Calculation (min) 40 min    Activity Tolerance Patient tolerated treatment well    Behavior During Therapy WFL for tasks assessed/performed             Past Medical History:  Diagnosis Date   Arthritis    Bilateral swelling of feet    Chicken pox    Complication of anesthesia    difficulty waking up   Depression    Hypertension    Joint pain    Obesity    Osteoarthritis    Prediabetes    Shortness of breath    Swelling    feet and legs   Vitamin D deficiency    Past Surgical History:  Procedure Laterality Date   cartilage removal     COLONOSCOPY WITH PROPOFOL N/A 12/14/2019   Procedure: COLONOSCOPY WITH PROPOFOL;  Surgeon: Sherrilyn Rist, MD;  Location: [facility];  Service: Gastroenterology;  Laterality: N/A;   KNEE SURGERY  2000   POLYPECTOMY  12/14/2019   Procedure: POLYPECTOMY;  Surgeon: Sherrilyn Rist, MD;  Location: [facility];  Service: Gastroenterology;;   TOTAL HIP ARTHROPLASTY Left 02/11/2019   Procedure: TOTAL HIP ARTHROPLASTY-POSTERIOR;  Surgeon: Ollen Gross, MD;  Location: [facility];  Service: Orthopedics;  Laterality: Left;    Patient Active Problem List   Diagnosis Date Noted   Depression 09/08/2020   Special screening for malignant neoplasms, colon    Benign neoplasm of rectum    OA (osteoarthritis) of hip 02/11/2019   Other hyperlipidemia 04/10/2017   Vitamin D deficiency 04/10/2017   Prediabetes 12/17/2016   Class 3 severe obesity with serious comorbidity and body mass index (BMI) of 50.0 to 59.9 in adult (HCC) 12/17/2016   At risk for  diabetes mellitus 10/02/2016   Hyperglycemia 09/04/2016   Severe obesity (BMI >= 40) (HCC) 01/15/2013   OBESITY, MORBID 01/20/2007   Essential hypertension 10/31/2006    PCP: Shirline Frees, NP  REFERRING PROVIDER: Julio Sicks, MD  REFERRING DIAG: [phone] (ICD-10-CM) - Spinal stenosis, cervical region  THERAPY DIAG:  Cervicalgia  Muscle weakness (generalized)  Cramp and spasm  Other abnormalities of gait and mobility  Dizziness and giddiness  Rationale for Evaluation and Treatment: Rehabilitation  ONSET DATE: June 2024 pain started getting worse  SUBJECTIVE:  SUBJECTIVE STATEMENT: Patient reports that he is starting to feel better from his recent illness.  Patient reports some increased pain from lifting his youngest grandchild over the weekend.  Hand dominance: Right  PERTINENT HISTORY:  pre-diabetes, HTN, cervical radiculopathy, OSA, vertigo  PAIN:  Are you having pain? Yes: NPRS scale: 3/10 Pain location: shoulders, cervical.  States knee pain is 7/10 Pain description: sharp, tingling into bilateral hands Aggravating factors: certain positions (especially with lying on his side) Relieving factors: certain supportive positions  PRECAUTIONS: None  RED FLAGS: None     WEIGHT BEARING RESTRICTIONS: No  FALLS:  Has patient fallen in last 6 months? No  LIVING ENVIRONMENT: Lives with: lives with their spouse lives with wife who is primarily w/c bound and needs assistance with most daily activities Lives in: House/apartment Stairs:  2 story home, but they live on one level.  They have a platform lift to help get to driveway. Has following equipment at home: Single point cane and Grab bars  OCCUPATION: Retired  PLOF: Independent  PATIENT GOALS: To avoid cervical  surgery and some pain relief.  NEXT MD VISIT:  As needed  OBJECTIVE:  Note: Objective measures were completed at Evaluation unless otherwise noted.  DIAGNOSTIC FINDINGS:   Brain MRI on 04/18/2023 IMPRESSION: 1. No evidence of an acute intracranial abnormality. 2. Mild chronic small vessel ischemic changes within the cerebral white matter. 3. Mild generalized cerebral atrophy. 4. Moderate-to-severe mucosal thickening within the right sphenoid sinus.   Cervical MRI on 12/12/2022: IMPRESSION: 1. Degenerative disc bulge with uncovertebral and facet hypertrophy at C5-6 with resultant mild canal, with severe right worse than left C6 foraminal stenosis. Reactive endplate changes with marrow edema at this level could contribute to neck pain. 2. Mild left C7 foraminal narrowing related to disc bulge and uncovertebral disease. 3. Additional mild spondylosis elsewhere within the cervical spine as above without significant stenosis or impingement.  Lumbar MRI on 12/12/2022: IMPRESSION: 1. Mild spinal and bilateral lateral recess stenosis at L4-5. There is also a shallow broad-based left foraminal disc protrusion potentially irritating the left nerve root. 2. Mild bilateral foraminal encroachment at L3-4. 3. Advanced facet disease at L5-S1 but no spinal or foraminal stenosis.  PATIENT SURVEYS:  Eval:  NDI 32 / 50 = 64.0 % 04/15/2023:  Neck Disability Index score: 32 / 50 = 64.0 % 04/29/2023:  Neck Disability Index score: 27 / 50 = 54.0 %  COGNITION: Overall cognitive status: Within functional limits for tasks assessed  SENSATION: Pt reports tingling down bilateral UE  POSTURE: rounded shoulders, forward head, and flexed trunk   PALPATION: Tenderness to palpation with muscle spasms noted   CERVICAL ROM:   Active ROM A/ROM (deg) eval A/ROM (Deg) 04/15/23 A/ROM (Deg) 04/29/23  Flexion 55 45 55  Extension 25 27 25   Right lateral flexion 30 with pain 35 35  Left lateral flexion 35 35  35  Right rotation 48 55 55  Left rotation 55 55 65   (Blank rows = not tested)  UPPER EXTREMITY ROM:  Eval:  WFL  UPPER EXTREMITY MMT:  Eval:   Right shoulder strength grossly 4-/5 Left is [facility]  CERVICAL SPECIAL TESTS:  Eval:  Distraction test: decrease in pain  FUNCTIONAL TESTS:  Eval: 5 times sit to stand: 15.92 sec Timed up and go (TUG): 15.12 sec  04/29/2023: 5 times sit to stand:  16.74 sec  TODAY'S TREATMENT:  DATE:  06/03/2023 Nustep level 3 x3 min with PT present to discuss status Cervical SNAGs for rotation x20 bilat Cervical SNAGs for extension x20 Seated blue pball rollout 2x10 Seated shoulder punches with 2# 2x10 Seated shoulder flexion, scaption, and abduction with 2# 2x10 each bilat Seated shoulder shoulder ER with green tband 2x10 Seated shoulder horizontal abduction with green tband 2x10 Seated shoulder D2 with green tband 2x10 Seated transversus abdominus hold 2x10 Seated shoulder rows and shoulder extension with green tband 2x10 each Seated upper trap stretch 2x20 sec bilat   05/22/2023 Nustep level 1 x3 min with PT present to discuss status Cervical SNAGs for rotation x20 bilat Cervical SNAGs for extension x20 Seated blue pball rollout 2x10 Seated shoulder punches with 2# 2x10 Seated shoulder flexion, scaption, and abduction with 2# 2x10 each bilat Seated shoulder horizontal abduction with green tband 2x10 Seated shoulder shoulder ER with green tband 2x10 Seated shoulder D2 with green tband 2x10 Seated shoulder rows and shoulder extension with green tband 2x10 each Seated lateral overhead lean over blue peanut ball x10 bilat Seated upper trap stretch 2x20 sec bilat Seated levator stretch 2x20 sec bilat Seated transversus abdominus hold 2x10   05/17/2023 Nustep level 1 x3 min with PT present to discuss status Seated  shoulder horizontal abduction with green tband 2x10 Seated shoulder shoulder ER with green tband 2x10 Seated shoulder D2 with green tband 2x10 Seated shoulder punches with 2# 2x10 Seated shoulder flexion, scaption, and abduction with 2# 2x10 each bilat Bicep curls with 5# dumbbells 2x10 Seated blue pball rollout 2x10 Cervical SNAGs for rotation x20 bilat Cervical SNAGs for extension x20 Ultrasound to cervical and upper trap region:  3.3 MHz, 2.3 W/cm2, x8 minutes     PATIENT EDUCATION:  Education details: Issued HEP Person educated: Patient Education method: Explanation, Facilities manager, and Handouts Education comprehension: verbalized understanding  HOME EXERCISE PROGRAM: Access Code: [email or web address] URL: [email or web address] Date: 05/22/2023 Prepared by: Clydie [name] Jeret Goyer  Exercises - Seated Scapular Retraction  - 1 x daily - 7 x weekly - 2 sets - 10 reps - Seated Cervical Retraction  - 1 x daily - 7 x weekly - 2 sets - 10 reps - Seated Assisted Cervical Rotation with Towel  - 1 x daily - 7 x weekly - 1-2 sets - 10 reps - Mid-Lower Cervical Extension SNAG with Strap  - 1 x daily - 7 x weekly - 1-2 sets - 10 reps - Shoulder External Rotation and Scapular Retraction with Resistance  - 1 x daily - 7 x weekly - 2 sets - 10 reps - Standing Shoulder Horizontal Abduction with Resistance  - 1 x daily - 7 x weekly - 2 sets - 10 reps - Standing Shoulder Diagonal Horizontal Abduction 60/120 Degrees with Resistance  - 1 x daily - 7 x weekly - 2 sets - 10 reps  ASSESSMENT:  CLINICAL IMPRESSION: Mr Decock presents to PT reporting that he is having some increased pain and tightness in his cervical/shoulder region, however, he is starting to feel better after his recent illness.  Patient required a slower pace today secondary to recent illness, but able to progress through session.  Patient did report feeling some looser at end of PT session.   OBJECTIVE IMPAIRMENTS: decreased  mobility, decreased ROM, dizziness, impaired perceived functional ability, increased muscle spasms, impaired UE functional use, postural dysfunction, and pain.   ACTIVITY LIMITATIONS: carrying, lifting, sleeping, and transfers  PARTICIPATION LIMITATIONS: cleaning, laundry, driving, and community activity  PERSONAL FACTORS: Past/current experiences, Time  since onset of injury/illness/exacerbation, and 3+ comorbidities: pre-diabetes, carpal tunnel, OA  are also affecting patient's functional outcome.   REHAB POTENTIAL: Good  CLINICAL DECISION MAKING: Evolving/moderate complexity  EVALUATION COMPLEXITY: Moderate   GOALS: Goals reviewed with patient? Yes  SHORT TERM GOALS: Target date: 03/22/2023  Pt will be independent with initial HEP. Baseline:  Goal status: Met on 03/18/23  2.  Patient will report at least a 25% improvement in symptoms since starting PT. Baseline:  Goal status: Met on 03/27/2023   LONG TERM GOALS: Target date: 07/05/2023  Patient will be independent with advanced HEP. Baseline:  Goal status: Ongoing  2.  Patient will improve Neck Disability Index to no greater than 50% to demonstrate improvements in functional tasks. Baseline: 64% Goal status: Ongoing (see above)  3.  Patient will increase cervical A/ROM to [facility] without increased pain or dizziness to allow for improved ease with driving. Baseline:  Goal status: Met on 04/29/2023  4.  Patient will increase right shoulder strength to at least 4/5 to allow him to be able to perform functional tasks with increased ease. Baseline:  Goal status: Ongoing  5.  Patient will report at least a 50% improvement in pain symptoms since starting PT. Baseline:  Goal status: Met on 03/27/2023 (following cervical injection)    PLAN:  PT FREQUENCY: 2x/week  PT DURATION: 8 weeks  PLANNED INTERVENTIONS: 97164- PT Re-evaluation, 97110-Therapeutic exercises, 97530- Therapeutic activity, 97112- Neuromuscular  re-education, 97535- Self Care, 16109- Manual therapy, [phone]- Canalith repositioning, [id number]- Aquatic Therapy, 97014- Electrical stimulation (unattended), [id number]- Electrical stimulation (manual), [id number]- Ultrasound, 09811- Traction (mechanical), [id number]- Ionotophoresis 4mg /ml Dexamethasone, Patient/Family education, Balance training, Taping, Dry Needling, Joint mobilization, Joint manipulation, Spinal manipulation, Spinal mobilization, Cryotherapy, and Moist heat  PLAN FOR NEXT SESSION: Assess and progress HEP as indicated, strengthening, ROM, manual/dry needling as indicated, assess response to e-stim   Reather Laurence, PT, DPT 06/03/23, 10:17 AM  [facility] [address] [place], [place] [address] Phone # [phone] Fax [phone]

## 2023-06-05 ENCOUNTER — Encounter: Payer: Self-pay | Admitting: Rehabilitative and Restorative Service Providers"

## 2023-06-05 ENCOUNTER — Ambulatory Visit: Payer: HMO | Admitting: Rehabilitative and Restorative Service Providers"

## 2023-06-05 DIAGNOSIS — M6281 Muscle weakness (generalized): Secondary | ICD-10-CM

## 2023-06-05 DIAGNOSIS — M542 Cervicalgia: Secondary | ICD-10-CM

## 2023-06-05 DIAGNOSIS — R2689 Other abnormalities of gait and mobility: Secondary | ICD-10-CM

## 2023-06-05 DIAGNOSIS — R252 Cramp and spasm: Secondary | ICD-10-CM

## 2023-06-05 DIAGNOSIS — R42 Dizziness and giddiness: Secondary | ICD-10-CM

## 2023-06-05 NOTE — Therapy (Signed)
OUTPATIENT PHYSICAL THERAPY TREATMENT NOTE   Patient Name: James [name] MRN: [id number] DOB:Jan 30, 1956, 68 y.o., male Today's Date: 06/05/2023    END OF SESSION:  PT End of Session - 06/05/23 0929     Visit Number 18    Date for PT Re-Evaluation 07/05/23    Authorization Type [employer]    Progress Note Due on Visit 23    PT Start Time 0927    PT Stop Time 1010    PT Time Calculation (min) 43 min    Activity Tolerance Patient tolerated treatment well    Behavior During Therapy [facility] for tasks assessed/performed             Past Medical History:  Diagnosis Date   Arthritis    Bilateral swelling of feet    Chicken pox    Complication of anesthesia    difficulty waking up   Depression    Hypertension    Joint pain    Obesity    Osteoarthritis    Prediabetes    Shortness of breath    Swelling    feet and legs   Vitamin D deficiency    Past Surgical History:  Procedure Laterality Date   cartilage removal     COLONOSCOPY WITH PROPOFOL N/A 12/14/2019   Procedure: COLONOSCOPY WITH PROPOFOL;  Surgeon: Sherrilyn Rist, MD;  Location: [facility];  Service: Gastroenterology;  Laterality: N/A;   KNEE SURGERY  2000   POLYPECTOMY  12/14/2019   Procedure: POLYPECTOMY;  Surgeon: Sherrilyn Rist, MD;  Location: [facility];  Service: Gastroenterology;;   TOTAL HIP ARTHROPLASTY Left 02/11/2019   Procedure: TOTAL HIP ARTHROPLASTY-POSTERIOR;  Surgeon: Ollen Gross, MD;  Location: [facility];  Service: Orthopedics;  Laterality: Left;    Patient Active Problem List   Diagnosis Date Noted   Depression 09/08/2020   Special screening for malignant neoplasms, colon    Benign neoplasm of rectum    OA (osteoarthritis) of hip 02/11/2019   Other hyperlipidemia 04/10/2017   Vitamin D deficiency 04/10/2017   Prediabetes 12/17/2016   Class 3 severe obesity with serious comorbidity and body mass index (BMI) of 50.0 to 59.9 in adult (HCC) 12/17/2016   At risk for  diabetes mellitus 10/02/2016   Hyperglycemia 09/04/2016   Severe obesity (BMI >= 40) (HCC) 01/15/2013   OBESITY, MORBID 01/20/2007   Essential hypertension 10/31/2006    PCP: Shirline Frees, NP  REFERRING PROVIDER: Julio Sicks, MD  REFERRING DIAG: [phone] (ICD-10-CM) - Spinal stenosis, cervical region  THERAPY DIAG:  Cervicalgia  Muscle weakness (generalized)  Cramp and spasm  Other abnormalities of gait and mobility  Dizziness and giddiness  Rationale for Evaluation and Treatment: Rehabilitation  ONSET DATE: June 2024 pain started getting worse  SUBJECTIVE:  SUBJECTIVE STATEMENT: Patient reports that he got a new cervical pillow for Christmas and it does seem to be helping.  He also reports that he seemed to sleep better with his CPAP last night.  Hand dominance: Right  PERTINENT HISTORY:  pre-diabetes, HTN, cervical radiculopathy, OSA, vertigo  PAIN:  Are you having pain? Yes: NPRS scale: 3/10 Pain location: shoulders, cervical.  States knee pain is 5/10 Pain description: sharp, tingling into bilateral hands Aggravating factors: certain positions (especially with lying on his side) Relieving factors: certain supportive positions  PRECAUTIONS: None  RED FLAGS: None     WEIGHT BEARING RESTRICTIONS: No  FALLS:  Has patient fallen in last 6 months? No  LIVING ENVIRONMENT: Lives with: lives with their spouse lives with wife who is primarily w/c bound and needs assistance with most daily activities Lives in: House/apartment Stairs:  2 story home, but they live on one level.  They have a platform lift to help get to driveway. Has following equipment at home: Single point cane and Grab bars  OCCUPATION: Retired  PLOF: Independent  PATIENT GOALS: To avoid cervical  surgery and some pain relief.  NEXT MD VISIT:  As needed  OBJECTIVE:  Note: Objective measures were completed at Evaluation unless otherwise noted.  DIAGNOSTIC FINDINGS:   Brain MRI on 04/18/2023 IMPRESSION: 1. No evidence of an acute intracranial abnormality. 2. Mild chronic small vessel ischemic changes within the cerebral white matter. 3. Mild generalized cerebral atrophy. 4. Moderate-to-severe mucosal thickening within the right sphenoid sinus.   Cervical MRI on 12/12/2022: IMPRESSION: 1. Degenerative disc bulge with uncovertebral and facet hypertrophy at C5-6 with resultant mild canal, with severe right worse than left C6 foraminal stenosis. Reactive endplate changes with marrow edema at this level could contribute to neck pain. 2. Mild left C7 foraminal narrowing related to disc bulge and uncovertebral disease. 3. Additional mild spondylosis elsewhere within the cervical spine as above without significant stenosis or impingement.  Lumbar MRI on 12/12/2022: IMPRESSION: 1. Mild spinal and bilateral lateral recess stenosis at L4-5. There is also a shallow broad-based left foraminal disc protrusion potentially irritating the left nerve root. 2. Mild bilateral foraminal encroachment at L3-4. 3. Advanced facet disease at L5-S1 but no spinal or foraminal stenosis.  PATIENT SURVEYS:  Eval:  NDI 32 / 50 = 64.0 % 04/15/2023:  Neck Disability Index score: 32 / 50 = 64.0 % 04/29/2023:  Neck Disability Index score: 27 / 50 = 54.0 %  COGNITION: Overall cognitive status: Within functional limits for tasks assessed  SENSATION: Pt reports tingling down bilateral UE  POSTURE: rounded shoulders, forward head, and flexed trunk   PALPATION: Tenderness to palpation with muscle spasms noted   CERVICAL ROM:   Active ROM A/ROM (deg) eval A/ROM (Deg) 04/15/23 A/ROM (Deg) 04/29/23  Flexion 55 45 55  Extension 25 27 25   Right lateral flexion 30 with pain 35 35  Left lateral flexion 35 35  35  Right rotation 48 55 55  Left rotation 55 55 65   (Blank rows = not tested)  UPPER EXTREMITY ROM:  Eval:  WFL  UPPER EXTREMITY MMT:  Eval:   Right shoulder strength grossly 4-/5 Left is [facility]  CERVICAL SPECIAL TESTS:  Eval:  Distraction test: decrease in pain  FUNCTIONAL TESTS:  Eval: 5 times sit to stand: 15.92 sec Timed up and go (TUG): 15.12 sec  04/29/2023: 5 times sit to stand:  16.74 sec  TODAY'S TREATMENT:  DATE:  06/05/2023 Nustep level 3 x3 min with PT present to discuss status Cervical SNAGs for rotation x20 bilat Cervical SNAGs for extension x20 Seated shoulder punches with 2# 2x10 Seated shoulder flexion, scaption, and abduction with 2# 2x10 each bilat Seated shoulder flexion, scaption, and abduction with 2# 2x10 each bilat Seated shoulder shoulder ER with green tband 2x10 Seated shoulder horizontal abduction with green tband 2x10 Seated shoulder D2 with green tband 2x10 Seated shoulder rows and shoulder extension with green tband 2x10 each Seated transversus abdominus hold 2x10 Seated upper trap stretch 2x20 sec bilat   06/03/2023 Nustep level 3 x3 min with PT present to discuss status Cervical SNAGs for rotation x20 bilat Cervical SNAGs for extension x20 Seated blue pball rollout 2x10 Seated shoulder punches with 2# 2x10 Seated shoulder flexion, scaption, and abduction with 2# 2x10 each bilat Seated shoulder shoulder ER with green tband 2x10 Seated shoulder horizontal abduction with green tband 2x10 Seated shoulder D2 with green tband 2x10 Seated transversus abdominus hold 2x10 Seated shoulder rows and shoulder extension with green tband 2x10 each Seated upper trap stretch 2x20 sec bilat   05/22/2023 Nustep level 1 x3 min with PT present to discuss status Cervical SNAGs for rotation x20 bilat Cervical SNAGs for extension  x20 Seated blue pball rollout 2x10 Seated shoulder punches with 2# 2x10 Seated shoulder flexion, scaption, and abduction with 2# 2x10 each bilat Seated shoulder horizontal abduction with green tband 2x10 Seated shoulder shoulder ER with green tband 2x10 Seated shoulder D2 with green tband 2x10 Seated shoulder rows and shoulder extension with green tband 2x10 each Seated lateral overhead lean over blue peanut ball x10 bilat Seated upper trap stretch 2x20 sec bilat Seated levator stretch 2x20 sec bilat Seated transversus abdominus hold 2x10   PATIENT EDUCATION:  Education details: Issued HEP Person educated: Patient Education method: Explanation, Facilities manager, and Handouts Education comprehension: verbalized understanding  HOME EXERCISE PROGRAM: Access Code: [email or web address] URL: [email or web address] Date: 05/22/2023 Prepared by: Clydie [name] Stewart Pimenta  Exercises - Seated Scapular Retraction  - 1 x daily - 7 x weekly - 2 sets - 10 reps - Seated Cervical Retraction  - 1 x daily - 7 x weekly - 2 sets - 10 reps - Seated Assisted Cervical Rotation with Towel  - 1 x daily - 7 x weekly - 1-2 sets - 10 reps - Mid-Lower Cervical Extension SNAG with Strap  - 1 x daily - 7 x weekly - 1-2 sets - 10 reps - Shoulder External Rotation and Scapular Retraction with Resistance  - 1 x daily - 7 x weekly - 2 sets - 10 reps - Standing Shoulder Horizontal Abduction with Resistance  - 1 x daily - 7 x weekly - 2 sets - 10 reps - Standing Shoulder Diagonal Horizontal Abduction 60/120 Degrees with Resistance  - 1 x daily - 7 x weekly - 2 sets - 10 reps  ASSESSMENT:  CLINICAL IMPRESSION: Mr Viana presents to PT reporting that he is feeling some better today, but states that he did sleep better.  Patient with good participation throughout session.  Patient continues to progress with strengthening and flexibility and reports feeling looser after PT.  Patient continues to require skilled PT to progress  towards goal related activities.   OBJECTIVE IMPAIRMENTS: decreased mobility, decreased ROM, dizziness, impaired perceived functional ability, increased muscle spasms, impaired UE functional use, postural dysfunction, and pain.   ACTIVITY LIMITATIONS: carrying, lifting, sleeping, and transfers  PARTICIPATION LIMITATIONS: cleaning, laundry, driving, and community activity  PERSONAL FACTORS:  Past/current experiences, Time since onset of injury/illness/exacerbation, and 3+ comorbidities: pre-diabetes, carpal tunnel, OA  are also affecting patient's functional outcome.   REHAB POTENTIAL: Good  CLINICAL DECISION MAKING: Evolving/moderate complexity  EVALUATION COMPLEXITY: Moderate   GOALS: Goals reviewed with patient? Yes  SHORT TERM GOALS: Target date: 03/22/2023  Pt will be independent with initial HEP. Baseline:  Goal status: Met on 03/18/23  2.  Patient will report at least a 25% improvement in symptoms since starting PT. Baseline:  Goal status: Met on 03/27/2023   LONG TERM GOALS: Target date: 07/05/2023  Patient will be independent with advanced HEP. Baseline:  Goal status: Ongoing  2.  Patient will improve Neck Disability Index to no greater than 50% to demonstrate improvements in functional tasks. Baseline: 64% Goal status: Ongoing (see above)  3.  Patient will increase cervical A/ROM to [facility] without increased pain or dizziness to allow for improved ease with driving. Baseline:  Goal status: Met on 04/29/2023  4.  Patient will increase right shoulder strength to at least 4/5 to allow him to be able to perform functional tasks with increased ease. Baseline:  Goal status: Ongoing  5.  Patient will report at least a 50% improvement in pain symptoms since starting PT. Baseline:  Goal status: Met on 03/27/2023 (following cervical injection)    PLAN:  PT FREQUENCY: 2x/week  PT DURATION: 8 weeks  PLANNED INTERVENTIONS: 97164- PT Re-evaluation, 97110-Therapeutic  exercises, 97530- Therapeutic activity, 97112- Neuromuscular re-education, 97535- Self Care, 16109- Manual therapy, [phone]- Canalith repositioning, [id number]- Aquatic Therapy, 97014- Electrical stimulation (unattended), [id number]- Electrical stimulation (manual), [id number]- Ultrasound, 09811- Traction (mechanical), [id number]- Ionotophoresis 4mg /ml Dexamethasone, Patient/Family education, Balance training, Taping, Dry Needling, Joint mobilization, Joint manipulation, Spinal manipulation, Spinal mobilization, Cryotherapy, and Moist heat  PLAN FOR NEXT SESSION: Assess and progress HEP as indicated, strengthening, ROM, manual/dry needling as indicated, assess response to e-stim   Reather Laurence, PT, DPT 06/05/23, 10:30 AM  [facility] [address], [address] [place], [place] [address] Phone # [phone] Fax [phone]

## 2023-06-10 ENCOUNTER — Ambulatory Visit: Payer: HMO | Admitting: Rehabilitative and Restorative Service Providers"

## 2023-06-11 ENCOUNTER — Ambulatory Visit: Payer: HMO | Attending: Neurosurgery | Admitting: Rehabilitative and Restorative Service Providers"

## 2023-06-11 ENCOUNTER — Encounter: Payer: Self-pay | Admitting: Rehabilitative and Restorative Service Providers"

## 2023-06-11 DIAGNOSIS — M6281 Muscle weakness (generalized): Secondary | ICD-10-CM | POA: Diagnosis not present

## 2023-06-11 DIAGNOSIS — M542 Cervicalgia: Secondary | ICD-10-CM | POA: Diagnosis not present

## 2023-06-11 DIAGNOSIS — R252 Cramp and spasm: Secondary | ICD-10-CM | POA: Diagnosis not present

## 2023-06-11 DIAGNOSIS — R2689 Other abnormalities of gait and mobility: Secondary | ICD-10-CM | POA: Insufficient documentation

## 2023-06-11 NOTE — Therapy (Signed)
 OUTPATIENT PHYSICAL THERAPY TREATMENT NOTE   Patient Name: James [name] MRN: [id number] DOB:[birth year], 68 y.o., male Today's Date: 06/11/2023    END OF SESSION:  PT End of Session - 06/11/23 0935     Visit Number 19    Date for PT Re-Evaluation 07/05/23    Authorization Type [employer]    Progress Note Due on Visit 23    PT Start Time 0932    PT Stop Time 1010    PT Time Calculation (min) 38 min    Activity Tolerance Patient tolerated treatment well    Behavior During Therapy WFL for tasks assessed/performed             Past Medical History:  Diagnosis Date   Arthritis    Bilateral swelling of feet    Chicken pox    Complication of anesthesia    difficulty waking up   Depression    Hypertension    Joint pain    Obesity    Osteoarthritis    Prediabetes    Shortness of breath    Swelling    feet and legs   Vitamin D  deficiency    Past Surgical History:  Procedure Laterality Date   cartilage removal     COLONOSCOPY WITH PROPOFOL  N/A 12/14/2019   Procedure: COLONOSCOPY WITH PROPOFOL ;  Surgeon: Legrand Victory LITTIE DOUGLAS, MD;  Location: [facility];  Service: Gastroenterology;  Laterality: N/A;   KNEE SURGERY  2000   POLYPECTOMY  12/14/2019   Procedure: POLYPECTOMY;  Surgeon: Legrand Victory LITTIE DOUGLAS, MD;  Location: [facility];  Service: Gastroenterology;;   TOTAL HIP ARTHROPLASTY Left 02/11/2019   Procedure: TOTAL HIP ARTHROPLASTY-POSTERIOR;  Surgeon: Melodi Lerner, MD;  Location: [facility];  Service: Orthopedics;  Laterality: Left;    Patient Active Problem List   Diagnosis Date Noted   Depression 09/08/2020   Special screening for malignant neoplasms, colon    Benign neoplasm of rectum    OA (osteoarthritis) of hip 02/11/2019   Other hyperlipidemia 04/10/2017   Vitamin D  deficiency 04/10/2017   Prediabetes 12/17/2016   Class 3 severe obesity with serious comorbidity and body mass index (BMI) of 50.0 to 59.9 in adult (HCC) 12/17/2016   At risk for  diabetes mellitus 10/02/2016   Hyperglycemia 09/04/2016   Severe obesity (BMI >= 40) (HCC) 01/15/2013   OBESITY, MORBID 01/20/2007   Essential hypertension 10/31/2006    PCP: Merna Huxley, NP  REFERRING PROVIDER: Louis Victory, MD  REFERRING DIAG: [phone] (ICD-10-CM) - Spinal stenosis, cervical region  THERAPY DIAG:  Cervicalgia  Muscle weakness (generalized)  Cramp and spasm  Other abnormalities of gait and mobility  Rationale for Evaluation and Treatment: Rehabilitation  ONSET DATE: June 2024 pain started getting worse  SUBJECTIVE:  SUBJECTIVE STATEMENT: Patient with no new complaints.  Hand dominance: Right  PERTINENT HISTORY:  pre-diabetes, HTN, cervical radiculopathy, OSA, vertigo  PAIN:  Are you having pain? Yes: NPRS scale: 3/10 Pain location: shoulders, cervical.  States knee pain is 5/10 Pain description: sharp, tingling into bilateral hands Aggravating factors: certain positions (especially with lying on his side) Relieving factors: certain supportive positions  PRECAUTIONS: None  RED FLAGS: None     WEIGHT BEARING RESTRICTIONS: No  FALLS:  Has patient fallen in last 6 months? No  LIVING ENVIRONMENT: Lives with: lives with their spouse lives with wife who is primarily w/c bound and needs assistance with most daily activities Lives in: House/apartment Stairs:  2 story home, but they live on one level.  They have a platform lift to help get to driveway. Has following equipment at home: Single point cane and Grab bars  OCCUPATION: Retired  PLOF: Independent  PATIENT GOALS: To avoid cervical surgery and some pain relief.  NEXT MD VISIT:  As needed  OBJECTIVE:  Note: Objective measures were completed at Evaluation unless otherwise  noted.  DIAGNOSTIC FINDINGS:   Brain MRI on 04/18/2023 IMPRESSION: 1. No evidence of an acute intracranial abnormality. 2. Mild chronic small vessel ischemic changes within the cerebral white matter. 3. Mild generalized cerebral atrophy. 4. Moderate-to-severe mucosal thickening within the right sphenoid sinus.   Cervical MRI on 12/12/2022: IMPRESSION: 1. Degenerative disc bulge with uncovertebral and facet hypertrophy at C5-6 with resultant mild canal, with severe right worse than left C6 foraminal stenosis. Reactive endplate changes with marrow edema at this level could contribute to neck pain. 2. Mild left C7 foraminal narrowing related to disc bulge and uncovertebral disease. 3. Additional mild spondylosis elsewhere within the cervical spine as above without significant stenosis or impingement.  Lumbar MRI on 12/12/2022: IMPRESSION: 1. Mild spinal and bilateral lateral recess stenosis at L4-5. There is also a shallow broad-based left foraminal disc protrusion potentially irritating the left nerve root. 2. Mild bilateral foraminal encroachment at L3-4. 3. Advanced facet disease at L5-S1 but no spinal or foraminal stenosis.  PATIENT SURVEYS:  Eval:  NDI 32 / 50 = 64.0 % 04/15/2023:  Neck Disability Index score: 32 / 50 = 64.0 % 04/29/2023:  Neck Disability Index score: 27 / 50 = 54.0 %  COGNITION: Overall cognitive status: Within functional limits for tasks assessed  SENSATION: Pt reports tingling down bilateral UE  POSTURE: rounded shoulders, forward head, and flexed trunk   PALPATION: Tenderness to palpation with muscle spasms noted   CERVICAL ROM:   Active ROM A/ROM (deg) eval A/ROM (Deg) 04/15/23 A/ROM (Deg) 04/29/23  Flexion 55 45 55  Extension 25 27 25   Right lateral flexion 30 with pain 35 35  Left lateral flexion 35 35 35  Right rotation 48 55 55  Left rotation 55 55 65   (Blank rows = not tested)  UPPER EXTREMITY ROM:  Eval:  WFL  UPPER EXTREMITY  MMT:  Eval:   Right shoulder strength grossly 4-/5 Left is [facility]  CERVICAL SPECIAL TESTS:  Eval:  Distraction test: decrease in pain  FUNCTIONAL TESTS:  Eval: 5 times sit to stand: 15.92 sec Timed up and go (TUG): 15.12 sec  04/29/2023: 5 times sit to stand:  16.74 sec  TODAY'S TREATMENT:  DATE:  06/11/2023 Nustep level 3 x3 min with PT present to discuss status Cervical SNAGs for rotation x20 bilat Cervical SNAGs for extension x20 Seated shoulder punches with 2# 2x10 Seated shoulder flexion, scaption, and abduction with 2# 2x10 each bilat Seated shoulder rows and shoulder extension with green tband 2x10 each Seated transversus abdominus hold 2x10 Seated upper trap stretch 2x20 sec bilat Seated blue pball rollout 2x10 Seated lateral overhead lean over blue peanut ball 2x10 bilat   06/05/2023 Nustep level 3 x3 min with PT present to discuss status Cervical SNAGs for rotation x20 bilat Cervical SNAGs for extension x20 Seated shoulder punches with 2# 2x10 Seated shoulder flexion, scaption, and abduction with 2# 2x10 each bilat Seated shoulder shoulder ER with green tband 2x10 Seated shoulder horizontal abduction with green tband 2x10 Seated shoulder D2 with green tband 2x10 Seated shoulder rows and shoulder extension with green tband 2x10 each Seated transversus abdominus hold 2x10 Seated upper trap stretch 2x20 sec bilat   06/03/2023 Nustep level 3 x3 min with PT present to discuss status Cervical SNAGs for rotation x20 bilat Cervical SNAGs for extension x20 Seated blue pball rollout 2x10 Seated shoulder punches with 2# 2x10 Seated shoulder flexion, scaption, and abduction with 2# 2x10 each bilat Seated shoulder shoulder ER with green tband 2x10 Seated shoulder horizontal abduction with green tband 2x10 Seated shoulder D2 with green tband  2x10 Seated transversus abdominus hold 2x10 Seated shoulder rows and shoulder extension with green tband 2x10 each Seated upper trap stretch 2x20 sec bilat   PATIENT EDUCATION:  Education details: Issued HEP Person educated: Patient Education method: Explanation, Facilities Manager, and Handouts Education comprehension: verbalized understanding  HOME EXERCISE PROGRAM: Access Code: [email or web address] URL: [email or web address] Date: 05/22/2023 Prepared by: Jarrell Nelline Lio  Exercises - Seated Scapular Retraction  - 1 x daily - 7 x weekly - 2 sets - 10 reps - Seated Cervical Retraction  - 1 x daily - 7 x weekly - 2 sets - 10 reps - Seated Assisted Cervical Rotation with Towel  - 1 x daily - 7 x weekly - 1-2 sets - 10 reps - Mid-Lower Cervical Extension SNAG with Strap  - 1 x daily - 7 x weekly - 1-2 sets - 10 reps - Shoulder External Rotation and Scapular Retraction with Resistance  - 1 x daily - 7 x weekly - 2 sets - 10 reps - Standing Shoulder Horizontal Abduction with Resistance  - 1 x daily - 7 x weekly - 2 sets - 10 reps - Standing Shoulder Diagonal Horizontal Abduction 60/120 Degrees with Resistance  - 1 x daily - 7 x weekly - 2 sets - 10 reps  ASSESSMENT:  CLINICAL IMPRESSION: Mr Deere presents to PT reporting that he had to reschedule from yesterday to today due to a repairman coming to look at his wife's lift.  Patient with good participation during session.  Patient continues to have low level pain and states that he is using his TENS unit at home to assist with decreased pain.  Patient continues to to state that he feels looser after his PT sessions.   OBJECTIVE IMPAIRMENTS: decreased mobility, decreased ROM, dizziness, impaired perceived functional ability, increased muscle spasms, impaired UE functional use, postural dysfunction, and pain.   ACTIVITY LIMITATIONS: carrying, lifting, sleeping, and transfers  PARTICIPATION LIMITATIONS: cleaning, laundry, driving, and  community activity  PERSONAL FACTORS: Past/current experiences, Time since onset of injury/illness/exacerbation, and 3+ comorbidities: pre-diabetes, carpal tunnel, OA  are also affecting patient's functional outcome.   REHAB  POTENTIAL: Good  CLINICAL DECISION MAKING: Evolving/moderate complexity  EVALUATION COMPLEXITY: Moderate   GOALS: Goals reviewed with patient? Yes  SHORT TERM GOALS: Target date: 03/22/2023  Pt will be independent with initial HEP. Baseline:  Goal status: Met on 03/18/23  2.  Patient will report at least a 25% improvement in symptoms since starting PT. Baseline:  Goal status: Met on 03/27/2023   LONG TERM GOALS: Target date: 07/05/2023  Patient will be independent with advanced HEP. Baseline:  Goal status: Ongoing  2.  Patient will improve Neck Disability Index to no greater than 50% to demonstrate improvements in functional tasks. Baseline: 64% Goal status: Ongoing (see above)  3.  Patient will increase cervical A/ROM to [facility] without increased pain or dizziness to allow for improved ease with driving. Baseline:  Goal status: Met on 04/29/2023  4.  Patient will increase right shoulder strength to at least 4/5 to allow him to be able to perform functional tasks with increased ease. Baseline:  Goal status: Ongoing  5.  Patient will report at least a 50% improvement in pain symptoms since starting PT. Baseline:  Goal status: Met on 03/27/2023 (following cervical injection)    PLAN:  PT FREQUENCY: 2x/week  PT DURATION: 8 weeks  PLANNED INTERVENTIONS: 97164- PT Re-evaluation, 97110-Therapeutic exercises, 97530- Therapeutic activity, 97112- Neuromuscular re-education, 97535- Self Care, 02859- Manual therapy, [phone]- Canalith repositioning, [id number]- Aquatic Therapy, 97014- Electrical stimulation (unattended), [id number]- Electrical stimulation (manual), [id number]- Ultrasound, 02987- Traction (mechanical), [id number]- Ionotophoresis 4mg /ml Dexamethasone ,  Patient/Family education, Balance training, Taping, Dry Needling, Joint mobilization, Joint manipulation, Spinal manipulation, Spinal mobilization, Cryotherapy, and Moist heat  PLAN FOR NEXT SESSION: Assess and progress HEP as indicated, strengthening, ROM, manual/dry needling as indicated, assess response to e-stim   Jarrell Laming, PT, DPT 06/11/23, 10:16 AM  [facility] [address], [address] [place], [place] [address] Phone # [phone] Fax [phone]

## 2023-06-12 ENCOUNTER — Ambulatory Visit: Payer: HMO | Admitting: Rehabilitative and Restorative Service Providers"

## 2023-06-12 ENCOUNTER — Encounter: Payer: Self-pay | Admitting: Rehabilitative and Restorative Service Providers"

## 2023-06-12 DIAGNOSIS — M542 Cervicalgia: Secondary | ICD-10-CM | POA: Diagnosis not present

## 2023-06-12 DIAGNOSIS — M6281 Muscle weakness (generalized): Secondary | ICD-10-CM

## 2023-06-12 DIAGNOSIS — R2689 Other abnormalities of gait and mobility: Secondary | ICD-10-CM

## 2023-06-12 DIAGNOSIS — R252 Cramp and spasm: Secondary | ICD-10-CM

## 2023-06-12 NOTE — Therapy (Signed)
 OUTPATIENT PHYSICAL THERAPY TREATMENT NOTE   Patient Name: James [name] MRN: [id number] DOB:1955/10/16, 68 y.o., male Today's Date: 06/12/2023    END OF SESSION:  PT End of Session - 06/12/23 0933     Visit Number 20    Date for PT Re-Evaluation 07/05/23    Authorization Type [employer]    Progress Note Due on Visit 23    PT Start Time 0925    PT Stop Time 1005    PT Time Calculation (min) 40 min    Activity Tolerance Patient tolerated treatment well    Behavior During Therapy [facility] for tasks assessed/performed             Past Medical History:  Diagnosis Date   Arthritis    Bilateral swelling of feet    Chicken pox    Complication of anesthesia    difficulty waking up   Depression    Hypertension    Joint pain    Obesity    Osteoarthritis    Prediabetes    Shortness of breath    Swelling    feet and legs   Vitamin D  deficiency    Past Surgical History:  Procedure Laterality Date   cartilage removal     COLONOSCOPY WITH PROPOFOL  N/A 12/14/2019   Procedure: COLONOSCOPY WITH PROPOFOL ;  Surgeon: Legrand Victory LITTIE DOUGLAS, MD;  Location: [facility];  Service: Gastroenterology;  Laterality: N/A;   KNEE SURGERY  2000   POLYPECTOMY  12/14/2019   Procedure: POLYPECTOMY;  Surgeon: Legrand Victory LITTIE DOUGLAS, MD;  Location: [facility];  Service: Gastroenterology;;   TOTAL HIP ARTHROPLASTY Left 02/11/2019   Procedure: TOTAL HIP ARTHROPLASTY-POSTERIOR;  Surgeon: Melodi Lerner, MD;  Location: [facility];  Service: Orthopedics;  Laterality: Left;    Patient Active Problem List   Diagnosis Date Noted   Depression 09/08/2020   Special screening for malignant neoplasms, colon    Benign neoplasm of rectum    OA (osteoarthritis) of hip 02/11/2019   Other hyperlipidemia 04/10/2017   Vitamin D  deficiency 04/10/2017   Prediabetes 12/17/2016   Class 3 severe obesity with serious comorbidity and body mass index (BMI) of 50.0 to 59.9 in adult (HCC) 12/17/2016   At risk for  diabetes mellitus 10/02/2016   Hyperglycemia 09/04/2016   Severe obesity (BMI >= 40) (HCC) 01/15/2013   OBESITY, MORBID 01/20/2007   Essential hypertension 10/31/2006    PCP: Merna Huxley, NP  REFERRING PROVIDER: Louis Victory, MD  REFERRING DIAG: [phone] (ICD-10-CM) - Spinal stenosis, cervical region  THERAPY DIAG:  Cervicalgia  Muscle weakness (generalized)  Cramp and spasm  Other abnormalities of gait and mobility  Rationale for Evaluation and Treatment: Rehabilitation  ONSET DATE: June 2024 pain started getting worse  SUBJECTIVE:  SUBJECTIVE STATEMENT: Patient states that he's feeling the same as yesterday.  States cervical/shoulder pain 3/10, knee pain 5/10.  Hand dominance: Right  PERTINENT HISTORY:  pre-diabetes, HTN, cervical radiculopathy, OSA, vertigo  PAIN:  Are you having pain? Yes: NPRS scale: 3/10 Pain location: shoulders, cervical.  States knee pain is 5/10 Pain description: sharp, tingling into bilateral hands Aggravating factors: certain positions (especially with lying on his side) Relieving factors: certain supportive positions  PRECAUTIONS: None  RED FLAGS: None     WEIGHT BEARING RESTRICTIONS: No  FALLS:  Has patient fallen in last 6 months? No  LIVING ENVIRONMENT: Lives with: lives with their spouse lives with wife who is primarily w/c bound and needs assistance with most daily activities Lives in: House/apartment Stairs:  2 story home, but they live on one level.  They have a platform lift to help get to driveway. Has following equipment at home: Single point cane and Grab bars  OCCUPATION: Retired  PLOF: Independent  PATIENT GOALS: To avoid cervical surgery and some pain relief.  NEXT MD VISIT:  As needed  OBJECTIVE:  Note:  Objective measures were completed at Evaluation unless otherwise noted.  DIAGNOSTIC FINDINGS:   Brain MRI on 04/18/2023 IMPRESSION: 1. No evidence of an acute intracranial abnormality. 2. Mild chronic small vessel ischemic changes within the cerebral white matter. 3. Mild generalized cerebral atrophy. 4. Moderate-to-severe mucosal thickening within the right sphenoid sinus.   Cervical MRI on 12/12/2022: IMPRESSION: 1. Degenerative disc bulge with uncovertebral and facet hypertrophy at C5-6 with resultant mild canal, with severe right worse than left C6 foraminal stenosis. Reactive endplate changes with marrow edema at this level could contribute to neck pain. 2. Mild left C7 foraminal narrowing related to disc bulge and uncovertebral disease. 3. Additional mild spondylosis elsewhere within the cervical spine as above without significant stenosis or impingement.  Lumbar MRI on 12/12/2022: IMPRESSION: 1. Mild spinal and bilateral lateral recess stenosis at L4-5. There is also a shallow broad-based left foraminal disc protrusion potentially irritating the left nerve root. 2. Mild bilateral foraminal encroachment at L3-4. 3. Advanced facet disease at L5-S1 but no spinal or foraminal stenosis.  PATIENT SURVEYS:  Eval:  NDI 32 / 50 = 64.0 % 04/15/2023:  Neck Disability Index score: 32 / 50 = 64.0 % 04/29/2023:  Neck Disability Index score: 27 / 50 = 54.0 %  COGNITION: Overall cognitive status: Within functional limits for tasks assessed  SENSATION: Pt reports tingling down bilateral UE  POSTURE: rounded shoulders, forward head, and flexed trunk   PALPATION: Tenderness to palpation with muscle spasms noted   CERVICAL ROM:   Active ROM A/ROM (deg) eval A/ROM (Deg) 04/15/23 A/ROM (Deg) 04/29/23  Flexion 55 45 55  Extension 25 27 25   Right lateral flexion 30 with pain 35 35  Left lateral flexion 35 35 35  Right rotation 48 55 55  Left rotation 55 55 65   (Blank rows = not  tested)  UPPER EXTREMITY ROM:  Eval:  WFL  UPPER EXTREMITY MMT:  Eval:   Right shoulder strength grossly 4-/5 Left is [facility]  CERVICAL SPECIAL TESTS:  Eval:  Distraction test: decrease in pain  FUNCTIONAL TESTS:  Eval: 5 times sit to stand: 15.92 sec Timed up and go (TUG): 15.12 sec  04/29/2023: 5 times sit to stand:  16.74 sec  TODAY'S TREATMENT:  DATE:  06/12/2023 Nustep level 3 x3 min with PT present to discuss status Cervical SNAGs for rotation x20 bilat Cervical SNAGs for extension x20 Seated blue pball rollout 2x10 Seated lateral overhead lean over blue peanut ball 2x10 bilat Seated shoulder shoulder ER with green tband 2x10 Seated shoulder horizontal abduction with green tband 2x10 Seated shoulder D2 with green tband 2x10 Seated shoulder rows and shoulder extension with green tband 2x10 each Seated shoulder punches with 2# 2x10 Seated shoulder flexion, scaption, and abduction with 2# 2x10 each bilat Seated transversus abdominus hold 2x10 Seated upper trap stretch x20 sec bilat   06/11/2023 Nustep level 3 x3 min with PT present to discuss status Cervical SNAGs for rotation x20 bilat Cervical SNAGs for extension x20 Seated shoulder punches with 2# 2x10 Seated shoulder flexion, scaption, and abduction with 2# 2x10 each bilat Seated shoulder rows and shoulder extension with green tband 2x10 each Seated transversus abdominus hold 2x10 Seated upper trap stretch 2x20 sec bilat Seated blue pball rollout 2x10 Seated lateral overhead lean over blue peanut ball 2x10 bilat Seated shoulder shoulder ER with green tband 2x10 Seated shoulder horizontal abduction with green tband 2x10 Seated shoulder D2 with green tband 2x10   06/05/2023 Nustep level 3 x3 min with PT present to discuss status Cervical SNAGs for rotation x20 bilat Cervical SNAGs for  extension x20 Seated shoulder punches with 2# 2x10 Seated shoulder flexion, scaption, and abduction with 2# 2x10 each bilat Seated shoulder shoulder ER with green tband 2x10 Seated shoulder horizontal abduction with green tband 2x10 Seated shoulder D2 with green tband 2x10 Seated shoulder rows and shoulder extension with green tband 2x10 each Seated transversus abdominus hold 2x10 Seated upper trap stretch 2x20 sec bilat   PATIENT EDUCATION:  Education details: Issued HEP Person educated: Patient Education method: Explanation, Facilities Manager, and Handouts Education comprehension: verbalized understanding  HOME EXERCISE PROGRAM: Access Code: [email or web address] URL: [email or web address] Date: 05/22/2023 Prepared by: Jarrell Drucella Karbowski  Exercises - Seated Scapular Retraction  - 1 x daily - 7 x weekly - 2 sets - 10 reps - Seated Cervical Retraction  - 1 x daily - 7 x weekly - 2 sets - 10 reps - Seated Assisted Cervical Rotation with Towel  - 1 x daily - 7 x weekly - 1-2 sets - 10 reps - Mid-Lower Cervical Extension SNAG with Strap  - 1 x daily - 7 x weekly - 1-2 sets - 10 reps - Shoulder External Rotation and Scapular Retraction with Resistance  - 1 x daily - 7 x weekly - 2 sets - 10 reps - Standing Shoulder Horizontal Abduction with Resistance  - 1 x daily - 7 x weekly - 2 sets - 10 reps - Standing Shoulder Diagonal Horizontal Abduction 60/120 Degrees with Resistance  - 1 x daily - 7 x weekly - 2 sets - 10 reps  ASSESSMENT:  CLINICAL IMPRESSION: Mr Doshi presents to PT reporting that he is feeling similar to yesterday.  Patient is progressing with exercises and overall general mobility.  Patient states that he is doing his exercises.  Pt continues to report that he feels looser at end of PT session with stretching.   OBJECTIVE IMPAIRMENTS: decreased mobility, decreased ROM, dizziness, impaired perceived functional ability, increased muscle spasms, impaired UE functional use,  postural dysfunction, and pain.   ACTIVITY LIMITATIONS: carrying, lifting, sleeping, and transfers  PARTICIPATION LIMITATIONS: cleaning, laundry, driving, and community activity  PERSONAL FACTORS: Past/current experiences, Time since onset of injury/illness/exacerbation, and 3+ comorbidities: pre-diabetes, carpal tunnel, OA  are also affecting patient's functional outcome.   REHAB POTENTIAL: Good  CLINICAL DECISION MAKING: Evolving/moderate complexity  EVALUATION COMPLEXITY: Moderate   GOALS: Goals reviewed with patient? Yes  SHORT TERM GOALS: Target date: 03/22/2023  Pt will be independent with initial HEP. Baseline:  Goal status: Met on 03/18/23  2.  Patient will report at least a 25% improvement in symptoms since starting PT. Baseline:  Goal status: Met on 03/27/2023   LONG TERM GOALS: Target date: 07/05/2023  Patient will be independent with advanced HEP. Baseline:  Goal status: Ongoing  2.  Patient will improve Neck Disability Index to no greater than 50% to demonstrate improvements in functional tasks. Baseline: 64% Goal status: Ongoing (see above)  3.  Patient will increase cervical A/ROM to [facility] without increased pain or dizziness to allow for improved ease with driving. Baseline:  Goal status: Met on 04/29/2023  4.  Patient will increase right shoulder strength to at least 4/5 to allow him to be able to perform functional tasks with increased ease. Baseline:  Goal status: Ongoing  5.  Patient will report at least a 50% improvement in pain symptoms since starting PT. Baseline:  Goal status: Met on 03/27/2023 (following cervical injection)    PLAN:  PT FREQUENCY: 2x/week  PT DURATION: 8 weeks  PLANNED INTERVENTIONS: 97164- PT Re-evaluation, 97110-Therapeutic exercises, 97530- Therapeutic activity, 97112- Neuromuscular re-education, 97535- Self Care, 02859- Manual therapy, [phone]- Canalith repositioning, [id number]- Aquatic Therapy, 97014- Electrical  stimulation (unattended), [id number]- Electrical stimulation (manual), [id number]- Ultrasound, 02987- Traction (mechanical), [id number]- Ionotophoresis 4mg /ml Dexamethasone , Patient/Family education, Balance training, Taping, Dry Needling, Joint mobilization, Joint manipulation, Spinal manipulation, Spinal mobilization, Cryotherapy, and Moist heat  PLAN FOR NEXT SESSION: Assess and progress HEP as indicated, strengthening, ROM, manual/dry needling as indicated, assess response to e-stim   Jarrell Laming, PT, DPT 06/12/23, 10:15 AM  [facility] [address] [place], [place] [address] Phone # [phone] Fax [phone]

## 2023-06-19 ENCOUNTER — Encounter: Payer: Self-pay | Admitting: Rehabilitative and Restorative Service Providers"

## 2023-06-19 ENCOUNTER — Ambulatory Visit: Payer: HMO | Admitting: Rehabilitative and Restorative Service Providers"

## 2023-06-19 DIAGNOSIS — M542 Cervicalgia: Secondary | ICD-10-CM | POA: Diagnosis not present

## 2023-06-19 DIAGNOSIS — R252 Cramp and spasm: Secondary | ICD-10-CM

## 2023-06-19 DIAGNOSIS — M6281 Muscle weakness (generalized): Secondary | ICD-10-CM

## 2023-06-19 DIAGNOSIS — R2689 Other abnormalities of gait and mobility: Secondary | ICD-10-CM

## 2023-06-19 NOTE — Therapy (Signed)
 OUTPATIENT PHYSICAL THERAPY TREATMENT NOTE   Patient Name: James [name] MRN: [id number] DOB:12-30-1955, 68 y.o., male Today's Date: 06/19/2023  Progress Note Reporting Period 04/29/2023 to 06/19/2023  See note below for Objective Data and Assessment of Progress/Goals.      END OF SESSION:  PT End of Session - 06/19/23 0932     Visit Number 21    Date for PT Re-Evaluation 07/05/23    Authorization Type [employer]    Progress Note Due on Visit 31    PT Start Time 0929    PT Stop Time 1010    PT Time Calculation (min) 41 min    Activity Tolerance Patient tolerated treatment well    Behavior During Therapy WFL for tasks assessed/performed             Past Medical History:  Diagnosis Date   Arthritis    Bilateral swelling of feet    Chicken pox    Complication of anesthesia    difficulty waking up   Depression    Hypertension    Joint pain    Obesity    Osteoarthritis    Prediabetes    Shortness of breath    Swelling    feet and legs   Vitamin D deficiency    Past Surgical History:  Procedure Laterality Date   cartilage removal     COLONOSCOPY WITH PROPOFOL N/A 12/14/2019   Procedure: COLONOSCOPY WITH PROPOFOL;  Surgeon: Sherrilyn Rist, MD;  Location: [facility];  Service: Gastroenterology;  Laterality: N/A;   KNEE SURGERY  2000   POLYPECTOMY  12/14/2019   Procedure: POLYPECTOMY;  Surgeon: Sherrilyn Rist, MD;  Location: [facility];  Service: Gastroenterology;;   TOTAL HIP ARTHROPLASTY Left 02/11/2019   Procedure: TOTAL HIP ARTHROPLASTY-POSTERIOR;  Surgeon: Ollen Gross, MD;  Location: [facility];  Service: Orthopedics;  Laterality: Left;    Patient Active Problem List   Diagnosis Date Noted   Depression 09/08/2020   Special screening for malignant neoplasms, colon    Benign neoplasm of rectum    OA (osteoarthritis) of hip 02/11/2019   Other hyperlipidemia 04/10/2017   Vitamin D deficiency 04/10/2017   Prediabetes 12/17/2016    Class 3 severe obesity with serious comorbidity and body mass index (BMI) of 50.0 to 59.9 in adult (HCC) 12/17/2016   At risk for diabetes mellitus 10/02/2016   Hyperglycemia 09/04/2016   Severe obesity (BMI >= 40) (HCC) 01/15/2013   OBESITY, MORBID 01/20/2007   Essential hypertension 10/31/2006    PCP: Shirline Frees, NP  REFERRING PROVIDER: Julio Sicks, MD  REFERRING DIAG: [phone] (ICD-10-CM) - Spinal stenosis, cervical region  THERAPY DIAG:  Cervicalgia  Muscle weakness (generalized)  Cramp and spasm  Other abnormalities of gait and mobility  Rationale for Evaluation and Treatment: Rehabilitation  ONSET DATE: June 2024 pain started getting worse  SUBJECTIVE:  SUBJECTIVE STATEMENT: Patient states that he had some increased pain the past couple of days, but he is feeling better today and only has 1/10 shoulder/cervical pain.  Pt reports continued use of TENS unit.  Hand dominance: Right  PERTINENT HISTORY:  pre-diabetes, HTN, cervical radiculopathy, OSA, vertigo  PAIN:  Are you having pain? Yes: NPRS scale: 1/10 Pain location: shoulders, cervical.  States knee pain is 3/10 Pain description: sharp, tingling into bilateral hands Aggravating factors: certain positions (especially with lying on his side) Relieving factors: certain supportive positions  PRECAUTIONS: None  RED FLAGS: None     WEIGHT BEARING RESTRICTIONS: No  FALLS:  Has patient fallen in last 6 months? No  LIVING ENVIRONMENT: Lives with: lives with their spouse lives with wife who is primarily w/c bound and needs assistance with most daily activities Lives in: House/apartment Stairs:  2 story home, but they live on one level.  They have a platform lift to help get to driveway. Has following equipment  at home: Single point cane and Grab bars  OCCUPATION: Retired  PLOF: Independent  PATIENT GOALS: To avoid cervical surgery and some pain relief.  NEXT MD VISIT:  As needed  OBJECTIVE:  Note: Objective measures were completed at Evaluation unless otherwise noted.  DIAGNOSTIC FINDINGS:   Brain MRI on 04/18/2023 IMPRESSION: 1. No evidence of an acute intracranial abnormality. 2. Mild chronic small vessel ischemic changes within the cerebral white matter. 3. Mild generalized cerebral atrophy. 4. Moderate-to-severe mucosal thickening within the right sphenoid sinus.   Cervical MRI on 12/12/2022: IMPRESSION: 1. Degenerative disc bulge with uncovertebral and facet hypertrophy at C5-6 with resultant mild canal, with severe right worse than left C6 foraminal stenosis. Reactive endplate changes with marrow edema at this level could contribute to neck pain. 2. Mild left C7 foraminal narrowing related to disc bulge and uncovertebral disease. 3. Additional mild spondylosis elsewhere within the cervical spine as above without significant stenosis or impingement.  Lumbar MRI on 12/12/2022: IMPRESSION: 1. Mild spinal and bilateral lateral recess stenosis at L4-5. There is also a shallow broad-based left foraminal disc protrusion potentially irritating the left nerve root. 2. Mild bilateral foraminal encroachment at L3-4. 3. Advanced facet disease at L5-S1 but no spinal or foraminal stenosis.  PATIENT SURVEYS:  Eval:  NDI 32 / 50 = 64.0 % 04/15/2023:  Neck Disability Index score: 32 / 50 = 64.0 % 04/29/2023:  Neck Disability Index score: 27 / 50 = 54.0 %  COGNITION: Overall cognitive status: Within functional limits for tasks assessed  SENSATION: Pt reports tingling down bilateral UE  POSTURE: rounded shoulders, forward head, and flexed trunk   PALPATION: Tenderness to palpation with muscle spasms noted   CERVICAL ROM:   Active ROM A/ROM (deg) eval A/ROM (Deg) 04/15/23  A/ROM (Deg) 04/29/23  Flexion 55 45 55  Extension 25 27 25   Right lateral flexion 30 with pain 35 35  Left lateral flexion 35 35 35  Right rotation 48 55 55  Left rotation 55 55 65   (Blank rows = not tested)  UPPER EXTREMITY ROM:  Eval:  WFL  UPPER EXTREMITY MMT:  Eval:   Right shoulder strength grossly 4-/5 Left is [facility]  CERVICAL SPECIAL TESTS:  Eval:  Distraction test: decrease in pain  FUNCTIONAL TESTS:  Eval: 5 times sit to stand: 15.92 sec Timed up and go (TUG): 15.12 sec  04/29/2023: 5 times sit to stand:  16.74 sec  06/19/2023: 5 times sit to stand: 13.07 sec Timed up and  go (TUG): 12.70 sec  TODAY'S TREATMENT:                                                                                                                              DATE:  06/19/2023 Nustep level 3 x3 min with PT present to discuss status Cervical SNAGs for rotation x20 bilat Cervical SNAGs for extension x20 Seated transversus abdominus hold 2x10 Seated shoulder shoulder ER with green tband 2x10 Seated shoulder horizontal abduction with green tband 2x10 Seated shoulder D2 with green tband 2x10 Seated shoulder rows and shoulder extension with green tband 2x10 each Seated shoulder punches with 2# 2x10 Seated shoulder flexion, scaption, and abduction with 2# 2x10 each bilat Seated upper trap stretch 2x20 sec bilat Seated blue pball rollout 2x10 Seated lateral overhead lean over blue peanut ball 2x10 bilat   06/12/2023 Nustep level 3 x3 min with PT present to discuss status Cervical SNAGs for rotation x20 bilat Cervical SNAGs for extension x20 Seated blue pball rollout 2x10 Seated lateral overhead lean over blue peanut ball 2x10 bilat Seated shoulder shoulder ER with green tband 2x10 Seated shoulder horizontal abduction with green tband 2x10 Seated shoulder D2 with green tband 2x10 Seated shoulder rows and shoulder extension with green tband 2x10 each Seated shoulder punches with 2#  2x10 Seated shoulder flexion, scaption, and abduction with 2# 2x10 each bilat Seated transversus abdominus hold 2x10 Seated upper trap stretch x20 sec bilat   06/11/2023 Nustep level 3 x3 min with PT present to discuss status Cervical SNAGs for rotation x20 bilat Cervical SNAGs for extension x20 Seated shoulder punches with 2# 2x10 Seated shoulder flexion, scaption, and abduction with 2# 2x10 each bilat Seated shoulder rows and shoulder extension with green tband 2x10 each Seated transversus abdominus hold 2x10 Seated upper trap stretch 2x20 sec bilat Seated blue pball rollout 2x10 Seated lateral overhead lean over blue peanut ball 2x10 bilat Seated shoulder shoulder ER with green tband 2x10 Seated shoulder horizontal abduction with green tband 2x10 Seated shoulder D2 with green tband 2x10    PATIENT EDUCATION:  Education details: Issued HEP Person educated: Patient Education method: Explanation, Facilities manager, and Handouts Education comprehension: verbalized understanding  HOME EXERCISE PROGRAM: Access Code: [email or web address] URL: [email or web address] Date: 05/22/2023 Prepared by: Clydie [name] Monasia Lair  Exercises - Seated Scapular Retraction  - 1 x daily - 7 x weekly - 2 sets - 10 reps - Seated Cervical Retraction  - 1 x daily - 7 x weekly - 2 sets - 10 reps - Seated Assisted Cervical Rotation with Towel  - 1 x daily - 7 x weekly - 1-2 sets - 10 reps - Mid-Lower Cervical Extension SNAG with Strap  - 1 x daily - 7 x weekly - 1-2 sets - 10 reps - Shoulder External Rotation and Scapular Retraction with Resistance  - 1 x daily - 7 x weekly - 2 sets - 10 reps - Standing Shoulder Horizontal Abduction with Resistance  - 1 x daily -  7 x weekly - 2 sets - 10 reps - Standing Shoulder Diagonal Horizontal Abduction 60/120 Degrees with Resistance  - 1 x daily - 7 x weekly - 2 sets - 10 reps  ASSESSMENT:  CLINICAL IMPRESSION: Mr Klem presents to PT reporting that he is having  significantly less numbness and tingling down his back.  Patient with improved time on his 5 times sit to stand and TUG today.  Patient with decreased pain overall today and able to continue to progress towards goal related activities.  Patient on track for discharge at the end of the current cert, as anticipated.   OBJECTIVE IMPAIRMENTS: decreased mobility, decreased ROM, dizziness, impaired perceived functional ability, increased muscle spasms, impaired UE functional use, postural dysfunction, and pain.   ACTIVITY LIMITATIONS: carrying, lifting, sleeping, and transfers  PARTICIPATION LIMITATIONS: cleaning, laundry, driving, and community activity  PERSONAL FACTORS: Past/current experiences, Time since onset of injury/illness/exacerbation, and 3+ comorbidities: pre-diabetes, carpal tunnel, OA  are also affecting patient's functional outcome.   REHAB POTENTIAL: Good  CLINICAL DECISION MAKING: Evolving/moderate complexity  EVALUATION COMPLEXITY: Moderate   GOALS: Goals reviewed with patient? Yes  SHORT TERM GOALS: Target date: 03/22/2023  Pt will be independent with initial HEP. Baseline:  Goal status: Met on 03/18/23  2.  Patient will report at least a 25% improvement in symptoms since starting PT. Baseline:  Goal status: Met on 03/27/2023   LONG TERM GOALS: Target date: 07/05/2023  Patient will be independent with advanced HEP. Baseline:  Goal status: Ongoing  2.  Patient will improve Neck Disability Index to no greater than 50% to demonstrate improvements in functional tasks. Baseline: 64% Goal status: Ongoing (see above)  3.  Patient will increase cervical A/ROM to [facility] without increased pain or dizziness to allow for improved ease with driving. Baseline:  Goal status: Met on 04/29/2023  4.  Patient will increase right shoulder strength to at least 4/5 to allow him to be able to perform functional tasks with increased ease. Baseline:  Goal status: Ongoing  5.   Patient will report at least a 50% improvement in pain symptoms since starting PT. Baseline:  Goal status: Met on 03/27/2023 (following cervical injection)    PLAN:  PT FREQUENCY: 2x/week  PT DURATION: 8 weeks  PLANNED INTERVENTIONS: 97164- PT Re-evaluation, 97110-Therapeutic exercises, 97530- Therapeutic activity, 97112- Neuromuscular re-education, 97535- Self Care, 21308- Manual therapy, [phone]- Canalith repositioning, [id number]- Aquatic Therapy, 97014- Electrical stimulation (unattended), [id number]- Electrical stimulation (manual), [id number]- Ultrasound, 69629- Traction (mechanical), [id number]- Ionotophoresis 4mg /ml Dexamethasone, Patient/Family education, Balance training, Taping, Dry Needling, Joint mobilization, Joint manipulation, Spinal manipulation, Spinal mobilization, Cryotherapy, and Moist heat  PLAN FOR NEXT SESSION: Assess and progress HEP as indicated, strengthening, ROM, manual/dry needling as indicated, assess response to e-stim   Reather Laurence, PT, DPT 06/19/23, 10:16 AM  [facility] [address], [address] [place], [place] [address] Phone # [phone] Fax [phone]

## 2023-06-25 ENCOUNTER — Encounter: Payer: Self-pay | Admitting: Rehabilitative and Restorative Service Providers"

## 2023-06-25 ENCOUNTER — Ambulatory Visit: Payer: HMO | Admitting: Rehabilitative and Restorative Service Providers"

## 2023-06-25 DIAGNOSIS — R252 Cramp and spasm: Secondary | ICD-10-CM

## 2023-06-25 DIAGNOSIS — M6281 Muscle weakness (generalized): Secondary | ICD-10-CM

## 2023-06-25 DIAGNOSIS — M542 Cervicalgia: Secondary | ICD-10-CM

## 2023-06-25 DIAGNOSIS — R2689 Other abnormalities of gait and mobility: Secondary | ICD-10-CM

## 2023-06-25 NOTE — Therapy (Signed)
 OUTPATIENT PHYSICAL THERAPY TREATMENT NOTE   Patient Name: James [name] MRN: [id number] DOB:1955/07/03, 68 y.o., male Today's Date: 06/25/2023    END OF SESSION:  PT End of Session - 06/25/23 0932     Visit Number 22    Date for PT Re-Evaluation 07/05/23    Authorization Type [employer]    Progress Note Due on Visit 31    PT Start Time 0925    PT Stop Time 1005    PT Time Calculation (min) 40 min    Activity Tolerance Patient tolerated treatment well    Behavior During Therapy WFL for tasks assessed/performed             Past Medical History:  Diagnosis Date   Arthritis    Bilateral swelling of feet    Chicken pox    Complication of anesthesia    difficulty waking up   Depression    Hypertension    Joint pain    Obesity    Osteoarthritis    Prediabetes    Shortness of breath    Swelling    feet and legs   Vitamin D deficiency    Past Surgical History:  Procedure Laterality Date   cartilage removal     COLONOSCOPY WITH PROPOFOL N/A 12/14/2019   Procedure: COLONOSCOPY WITH PROPOFOL;  Surgeon: Sherrilyn Rist, MD;  Location: [facility];  Service: Gastroenterology;  Laterality: N/A;   KNEE SURGERY  2000   POLYPECTOMY  12/14/2019   Procedure: POLYPECTOMY;  Surgeon: Sherrilyn Rist, MD;  Location: [facility];  Service: Gastroenterology;;   TOTAL HIP ARTHROPLASTY Left 02/11/2019   Procedure: TOTAL HIP ARTHROPLASTY-POSTERIOR;  Surgeon: Ollen Gross, MD;  Location: [facility];  Service: Orthopedics;  Laterality: Left;    Patient Active Problem List   Diagnosis Date Noted   Depression 09/08/2020   Special screening for malignant neoplasms, colon    Benign neoplasm of rectum    OA (osteoarthritis) of hip 02/11/2019   Other hyperlipidemia 04/10/2017   Vitamin D deficiency 04/10/2017   Prediabetes 12/17/2016   Class 3 severe obesity with serious comorbidity and body mass index (BMI) of 50.0 to 59.9 in adult (HCC) 12/17/2016   At risk for  diabetes mellitus 10/02/2016   Hyperglycemia 09/04/2016   Severe obesity (BMI >= 40) (HCC) 01/15/2013   OBESITY, MORBID 01/20/2007   Essential hypertension 10/31/2006    PCP: Shirline Frees, NP  REFERRING PROVIDER: Julio Sicks, MD  REFERRING DIAG: [phone] (ICD-10-CM) - Spinal stenosis, cervical region  THERAPY DIAG:  Cervicalgia  Muscle weakness (generalized)  Cramp and spasm  Other abnormalities of gait and mobility  Rationale for Evaluation and Treatment: Rehabilitation  ONSET DATE: June 2024 pain started getting worse  SUBJECTIVE:  SUBJECTIVE STATEMENT: Patient with no new complaints.  States that overall, he feels significantly better from when he first started PT and is ready for discharge next week.  Hand dominance: Right  PERTINENT HISTORY:  pre-diabetes, HTN, cervical radiculopathy, OSA, vertigo  PAIN:  Are you having pain? Yes: NPRS scale: 4/10 Pain location: shoulders, cervical.  States knee pain is 5/10 Pain description: sharp, tingling into bilateral hands Aggravating factors: certain positions (especially with lying on his side) Relieving factors: certain supportive positions  PRECAUTIONS: None  RED FLAGS: None     WEIGHT BEARING RESTRICTIONS: No  FALLS:  Has patient fallen in last 6 months? No  LIVING ENVIRONMENT: Lives with: lives with their spouse lives with wife who is primarily w/c bound and needs assistance with most daily activities Lives in: House/apartment Stairs:  2 story home, but they live on one level.  They have a platform lift to help get to driveway. Has following equipment at home: Single point cane and Grab bars  OCCUPATION: Retired  PLOF: Independent  PATIENT GOALS: To avoid cervical surgery and some pain relief.  NEXT MD  VISIT:  As needed  OBJECTIVE:  Note: Objective measures were completed at Evaluation unless otherwise noted.  DIAGNOSTIC FINDINGS:   Brain MRI on 04/18/2023 IMPRESSION: 1. No evidence of an acute intracranial abnormality. 2. Mild chronic small vessel ischemic changes within the cerebral white matter. 3. Mild generalized cerebral atrophy. 4. Moderate-to-severe mucosal thickening within the right sphenoid sinus.   Cervical MRI on 12/12/2022: IMPRESSION: 1. Degenerative disc bulge with uncovertebral and facet hypertrophy at C5-6 with resultant mild canal, with severe right worse than left C6 foraminal stenosis. Reactive endplate changes with marrow edema at this level could contribute to neck pain. 2. Mild left C7 foraminal narrowing related to disc bulge and uncovertebral disease. 3. Additional mild spondylosis elsewhere within the cervical spine as above without significant stenosis or impingement.  Lumbar MRI on 12/12/2022: IMPRESSION: 1. Mild spinal and bilateral lateral recess stenosis at L4-5. There is also a shallow broad-based left foraminal disc protrusion potentially irritating the left nerve root. 2. Mild bilateral foraminal encroachment at L3-4. 3. Advanced facet disease at L5-S1 but no spinal or foraminal stenosis.  PATIENT SURVEYS:  Eval:  NDI 32 / 50 = 64.0 % 04/15/2023:  Neck Disability Index score: 32 / 50 = 64.0 % 04/29/2023:  Neck Disability Index score: 27 / 50 = 54.0 %  COGNITION: Overall cognitive status: Within functional limits for tasks assessed  SENSATION: Pt reports tingling down bilateral UE  POSTURE: rounded shoulders, forward head, and flexed trunk   PALPATION: Tenderness to palpation with muscle spasms noted   CERVICAL ROM:   Active ROM A/ROM (deg) eval A/ROM (Deg) 04/15/23 A/ROM (Deg) 04/29/23  Flexion 55 45 55  Extension 25 27 25   Right lateral flexion 30 with pain 35 35  Left lateral flexion 35 35 35  Right rotation 48 55 55  Left  rotation 55 55 65   (Blank rows = not tested)  UPPER EXTREMITY ROM:  Eval:  WFL  UPPER EXTREMITY MMT:  Eval:   Right shoulder strength grossly 4-/5 Left is [facility]  CERVICAL SPECIAL TESTS:  Eval:  Distraction test: decrease in pain  FUNCTIONAL TESTS:  Eval: 5 times sit to stand: 15.92 sec Timed up and go (TUG): 15.12 sec  04/29/2023: 5 times sit to stand:  16.74 sec  06/19/2023: 5 times sit to stand: 13.07 sec Timed up and go (TUG): 12.70 sec  TODAY'S TREATMENT:  DATE:  06/25/2023 Nustep level 3 x3 min with PT present to discuss status Cervical SNAGs for rotation x20 bilat Cervical SNAGs for extension x20 Seated transversus abdominus hold 2x10 Seated shoulder shoulder ER with green tband 2x10 Seated shoulder horizontal abduction with green tband 2x10 Seated shoulder D2 with green tband 2x10 Seated shoulder rows and shoulder extension with green tband 2x10 each Seated shoulder punches with 2# 2x10 Seated shoulder flexion, scaption, and abduction with 2# 2x10 each bilat Seated blue pball rollout 2x10 Seated lateral overhead lean over blue peanut ball 2x10 bilat Seated upper trap stretch 2x20 sec bilat   06/19/2023 Nustep level 3 x3 min with PT present to discuss status Cervical SNAGs for rotation x20 bilat Cervical SNAGs for extension x20 Seated transversus abdominus hold 2x10 Seated shoulder shoulder ER with green tband 2x10 Seated shoulder horizontal abduction with green tband 2x10 Seated shoulder D2 with green tband 2x10 Seated shoulder rows and shoulder extension with green tband 2x10 each Seated shoulder punches with 2# 2x10 Seated shoulder flexion, scaption, and abduction with 2# 2x10 each bilat Seated upper trap stretch 2x20 sec bilat Seated blue pball rollout 2x10 Seated lateral overhead lean over blue peanut ball 2x10  bilat   06/12/2023 Nustep level 3 x3 min with PT present to discuss status Cervical SNAGs for rotation x20 bilat Cervical SNAGs for extension x20 Seated blue pball rollout 2x10 Seated lateral overhead lean over blue peanut ball 2x10 bilat Seated shoulder shoulder ER with green tband 2x10 Seated shoulder horizontal abduction with green tband 2x10 Seated shoulder D2 with green tband 2x10 Seated shoulder rows and shoulder extension with green tband 2x10 each Seated shoulder punches with 2# 2x10 Seated shoulder flexion, scaption, and abduction with 2# 2x10 each bilat Seated transversus abdominus hold 2x10 Seated upper trap stretch x20 sec bilat   PATIENT EDUCATION:  Education details: Issued HEP Person educated: Patient Education method: Explanation, Facilities manager, and Handouts Education comprehension: verbalized understanding  HOME EXERCISE PROGRAM: Access Code: [email or web address] URL: [email or web address] Date: 05/22/2023 Prepared by: Clydie [name] Lavance Beazer  Exercises - Seated Scapular Retraction  - 1 x daily - 7 x weekly - 2 sets - 10 reps - Seated Cervical Retraction  - 1 x daily - 7 x weekly - 2 sets - 10 reps - Seated Assisted Cervical Rotation with Towel  - 1 x daily - 7 x weekly - 1-2 sets - 10 reps - Mid-Lower Cervical Extension SNAG with Strap  - 1 x daily - 7 x weekly - 1-2 sets - 10 reps - Shoulder External Rotation and Scapular Retraction with Resistance  - 1 x daily - 7 x weekly - 2 sets - 10 reps - Standing Shoulder Horizontal Abduction with Resistance  - 1 x daily - 7 x weekly - 2 sets - 10 reps - Standing Shoulder Diagonal Horizontal Abduction 60/120 Degrees with Resistance  - 1 x daily - 7 x weekly - 2 sets - 10 reps  ASSESSMENT:  CLINICAL IMPRESSION: Mr Koppelman presents to PT reporting that he is having some increased pain today, but overall, is feeling better than initial evaluation.  Patient continues to progress with UE strengthening and flexibility.  Patient  on track for discharge next week, per certification.   OBJECTIVE IMPAIRMENTS: decreased mobility, decreased ROM, dizziness, impaired perceived functional ability, increased muscle spasms, impaired UE functional use, postural dysfunction, and pain.   ACTIVITY LIMITATIONS: carrying, lifting, sleeping, and transfers  PARTICIPATION LIMITATIONS: cleaning, laundry, driving, and community activity  PERSONAL FACTORS: Past/current experiences, Time since onset  of injury/illness/exacerbation, and 3+ comorbidities: pre-diabetes, carpal tunnel, OA  are also affecting patient's functional outcome.   REHAB POTENTIAL: Good  CLINICAL DECISION MAKING: Evolving/moderate complexity  EVALUATION COMPLEXITY: Moderate   GOALS: Goals reviewed with patient? Yes  SHORT TERM GOALS: Target date: 03/22/2023  Pt will be independent with initial HEP. Baseline:  Goal status: Met on 03/18/23  2.  Patient will report at least a 25% improvement in symptoms since starting PT. Baseline:  Goal status: Met on 03/27/2023   LONG TERM GOALS: Target date: 07/05/2023  Patient will be independent with advanced HEP. Baseline:  Goal status: Ongoing  2.  Patient will improve Neck Disability Index to no greater than 50% to demonstrate improvements in functional tasks. Baseline: 64% Goal status: Ongoing (see above)  3.  Patient will increase cervical A/ROM to [facility] without increased pain or dizziness to allow for improved ease with driving. Baseline:  Goal status: Met on 04/29/2023  4.  Patient will increase right shoulder strength to at least 4/5 to allow him to be able to perform functional tasks with increased ease. Baseline:  Goal status: Ongoing  5.  Patient will report at least a 50% improvement in pain symptoms since starting PT. Baseline:  Goal status: Met on 03/27/2023 (following cervical injection)    PLAN:  PT FREQUENCY: 2x/week  PT DURATION: 8 weeks  PLANNED INTERVENTIONS: 97164- PT  Re-evaluation, 97110-Therapeutic exercises, 97530- Therapeutic activity, 97112- Neuromuscular re-education, 97535- Self Care, 16109- Manual therapy, [phone]- Canalith repositioning, [id number]- Aquatic Therapy, 97014- Electrical stimulation (unattended), [id number]- Electrical stimulation (manual), [id number]- Ultrasound, 09811- Traction (mechanical), [id number]- Ionotophoresis 4mg /ml Dexamethasone, Patient/Family education, Balance training, Taping, Dry Needling, Joint mobilization, Joint manipulation, Spinal manipulation, Spinal mobilization, Cryotherapy, and Moist heat  PLAN FOR NEXT SESSION: Assess and progress HEP as indicated, strengthening, ROM, manual/dry needling as indicated, preparation for DC next week   Raymone Pembroke, PT, DPT 06/25/23, 10:12 AM  [facility] [address] [place], [place] [address] Phone # [phone] Fax [phone]

## 2023-06-27 ENCOUNTER — Ambulatory Visit: Payer: HMO

## 2023-07-04 ENCOUNTER — Encounter: Payer: Self-pay | Admitting: Rehabilitative and Restorative Service Providers"

## 2023-07-04 ENCOUNTER — Other Ambulatory Visit: Payer: Self-pay | Admitting: Adult Health

## 2023-07-04 ENCOUNTER — Ambulatory Visit: Payer: HMO | Admitting: Rehabilitative and Restorative Service Providers"

## 2023-07-04 DIAGNOSIS — R252 Cramp and spasm: Secondary | ICD-10-CM

## 2023-07-04 DIAGNOSIS — M6281 Muscle weakness (generalized): Secondary | ICD-10-CM

## 2023-07-04 DIAGNOSIS — M542 Cervicalgia: Secondary | ICD-10-CM

## 2023-07-04 DIAGNOSIS — I10 Essential (primary) hypertension: Secondary | ICD-10-CM

## 2023-07-04 DIAGNOSIS — R2689 Other abnormalities of gait and mobility: Secondary | ICD-10-CM

## 2023-07-04 MED ORDER — LISINOPRIL 10 MG PO TABS
20.0000 mg | ORAL_TABLET | Freq: Every day | ORAL | 0 refills | Status: DC
Start: 2023-07-04 — End: 2023-10-01

## 2023-07-04 MED ORDER — HYDROCHLOROTHIAZIDE 25 MG PO TABS: 25.0000 mg | ORAL_TABLET | Freq: Every day | ORAL | 0 refills | Status: DC

## 2023-07-04 NOTE — Therapy (Signed)
 OUTPATIENT PHYSICAL THERAPY TREATMENT NOTE AND DISCHARGE SUMMARY   Patient Name: LORI LIEW MRN: [id number] DOB:January 11, 1956, 68 y.o., male Today's Date: 07/04/2023    END OF SESSION:  PT End of Session - 07/04/23 0939     Visit Number 23    Date for PT Re-Evaluation 07/05/23    Authorization Type [employer]    Progress Note Due on Visit 31    PT Start Time 0933    PT Stop Time 1011    PT Time Calculation (min) 38 min    Activity Tolerance Patient tolerated treatment well    Behavior During Therapy WFL for tasks assessed/performed             Past Medical History:  Diagnosis Date   Arthritis    Bilateral swelling of feet    Chicken pox    Complication of anesthesia    difficulty waking up   Depression    Hypertension    Joint pain    Obesity    Osteoarthritis    Prediabetes    Shortness of breath    Swelling    feet and legs   Vitamin D deficiency    Past Surgical History:  Procedure Laterality Date   cartilage removal     COLONOSCOPY WITH PROPOFOL N/A 12/14/2019   Procedure: COLONOSCOPY WITH PROPOFOL;  Surgeon: Sherrilyn Rist, MD;  Location: [facility];  Service: Gastroenterology;  Laterality: N/A;   KNEE SURGERY  2000   POLYPECTOMY  12/14/2019   Procedure: POLYPECTOMY;  Surgeon: Sherrilyn Rist, MD;  Location: [facility];  Service: Gastroenterology;;   TOTAL HIP ARTHROPLASTY Left 02/11/2019   Procedure: TOTAL HIP ARTHROPLASTY-POSTERIOR;  Surgeon: Ollen Gross, MD;  Location: [facility];  Service: Orthopedics;  Laterality: Left;    Patient Active Problem List   Diagnosis Date Noted   Depression 09/08/2020   Special screening for malignant neoplasms, colon    Benign neoplasm of rectum    OA (osteoarthritis) of hip 02/11/2019   Other hyperlipidemia 04/10/2017   Vitamin D deficiency 04/10/2017   Prediabetes 12/17/2016   Class 3 severe obesity with serious comorbidity and body mass index (BMI) of 50.0 to 59.9 in adult (HCC)  12/17/2016   At risk for diabetes mellitus 10/02/2016   Hyperglycemia 09/04/2016   Severe obesity (BMI >= 40) (HCC) 01/15/2013   OBESITY, MORBID 01/20/2007   Essential hypertension 10/31/2006    PCP: Shirline Frees, NP  REFERRING PROVIDER: Julio Sicks, MD  REFERRING DIAG: [phone] (ICD-10-CM) - Spinal stenosis, cervical region  THERAPY DIAG:  Cervicalgia  Muscle weakness (generalized)  Cramp and spasm  Other abnormalities of gait and mobility  Rationale for Evaluation and Treatment: Rehabilitation  ONSET DATE: June 2024 pain started getting worse  SUBJECTIVE:  SUBJECTIVE STATEMENT: Patient reports that he is not feeling well today, he is having a headache.  Hand dominance: Right  PERTINENT HISTORY:  pre-diabetes, HTN, cervical radiculopathy, OSA, vertigo  PAIN:  Are you having pain? Yes: NPRS scale: 4/10 Pain location: shoulders, cervical.  States knee pain is 5/10 Pain description: sharp, tingling into bilateral hands Aggravating factors: certain positions (especially with lying on his side) Relieving factors: certain supportive positions  PRECAUTIONS: None  RED FLAGS: None     WEIGHT BEARING RESTRICTIONS: No  FALLS:  Has patient fallen in last 6 months? No  LIVING ENVIRONMENT: Lives with: lives with their spouse lives with wife who is primarily w/c bound and needs assistance with most daily activities Lives in: House/apartment Stairs:  2 story home, but they live on one level.  They have a platform lift to help get to driveway. Has following equipment at home: Single point cane and Grab bars  OCCUPATION: Retired  PLOF: Independent  PATIENT GOALS: To avoid cervical surgery and some pain relief.  NEXT MD VISIT:  As needed  OBJECTIVE:  Note: Objective  measures were completed at Evaluation unless otherwise noted.  DIAGNOSTIC FINDINGS:   Brain MRI on 04/18/2023 IMPRESSION: 1. No evidence of an acute intracranial abnormality. 2. Mild chronic small vessel ischemic changes within the cerebral white matter. 3. Mild generalized cerebral atrophy. 4. Moderate-to-severe mucosal thickening within the right sphenoid sinus.   Cervical MRI on 12/12/2022: IMPRESSION: 1. Degenerative disc bulge with uncovertebral and facet hypertrophy at C5-6 with resultant mild canal, with severe right worse than left C6 foraminal stenosis. Reactive endplate changes with marrow edema at this level could contribute to neck pain. 2. Mild left C7 foraminal narrowing related to disc bulge and uncovertebral disease. 3. Additional mild spondylosis elsewhere within the cervical spine as above without significant stenosis or impingement.  Lumbar MRI on 12/12/2022: IMPRESSION: 1. Mild spinal and bilateral lateral recess stenosis at L4-5. There is also a shallow broad-based left foraminal disc protrusion potentially irritating the left nerve root. 2. Mild bilateral foraminal encroachment at L3-4. 3. Advanced facet disease at L5-S1 but no spinal or foraminal stenosis.  PATIENT SURVEYS:  Eval:  NDI 32 / 50 = 64.0 % 04/15/2023:  Neck Disability Index score: 32 / 50 = 64.0 % 04/29/2023:  Neck Disability Index score: 27 / 50 = 54.0 % 07/04/2023:  Neck Disability Index score: 16 / 50 = 32.0 %  COGNITION: Overall cognitive status: Within functional limits for tasks assessed  SENSATION: Pt reports tingling down bilateral UE  POSTURE: rounded shoulders, forward head, and flexed trunk   PALPATION: Tenderness to palpation with muscle spasms noted   CERVICAL ROM:   Active ROM A/ROM (deg) eval A/ROM (Deg) 04/15/23 A/ROM (Deg) 04/29/23  Flexion 55 45 55  Extension 25 27 25   Right lateral flexion 30 with pain 35 35  Left lateral flexion 35 35 35  Right rotation 48 55 55   Left rotation 55 55 65   (Blank rows = not tested)  UPPER EXTREMITY ROM:  Eval:  WFL  UPPER EXTREMITY MMT:  Eval:   Right shoulder strength grossly 4-/5 Left is [facility]  07/04/2023:  Shoulder strength at least 4+/5 grossly throughout  CERVICAL SPECIAL TESTS:  Eval:  Distraction test: decrease in pain  FUNCTIONAL TESTS:  Eval: 5 times sit to stand: 15.92 sec Timed up and go (TUG): 15.12 sec  04/29/2023: 5 times sit to stand:  16.74 sec  06/19/2023: 5 times sit to stand: 13.07 sec  Timed up and go (TUG): 12.70 sec  TODAY'S TREATMENT:                                                                                                                              DATE:  07/04/2023 Nustep level 3 x3 min with PT present to discuss status Cervical SNAGs for rotation x20 bilat Cervical SNAGs for extension x20 Seated transversus abdominus hold 2x10 Seated shoulder shoulder ER with green tband 2x10 Seated shoulder horizontal abduction with green tband 2x10 Seated shoulder D2 with green tband 2x10 Seated shoulder rows and shoulder extension with green tband 2x10 each Seated shoulder punches with 2# 2x10 Seated shoulder flexion, scaption, and abduction with 2# 2x10 each bilat NDI   06/25/2023 Nustep level 3 x3 min with PT present to discuss status Cervical SNAGs for rotation x20 bilat Cervical SNAGs for extension x20 Seated transversus abdominus hold 2x10 Seated shoulder shoulder ER with green tband 2x10 Seated shoulder horizontal abduction with green tband 2x10 Seated shoulder D2 with green tband 2x10 Seated shoulder rows and shoulder extension with green tband 2x10 each Seated shoulder punches with 2# 2x10 Seated shoulder flexion, scaption, and abduction with 2# 2x10 each bilat Seated blue pball rollout 2x10 Seated lateral overhead lean over blue peanut ball 2x10 bilat Seated upper trap stretch 2x20 sec bilat   06/19/2023 Nustep level 3 x3 min with PT present to discuss  status Cervical SNAGs for rotation x20 bilat Cervical SNAGs for extension x20 Seated transversus abdominus hold 2x10 Seated shoulder shoulder ER with green tband 2x10 Seated shoulder horizontal abduction with green tband 2x10 Seated shoulder D2 with green tband 2x10 Seated shoulder rows and shoulder extension with green tband 2x10 each Seated shoulder punches with 2# 2x10 Seated shoulder flexion, scaption, and abduction with 2# 2x10 each bilat Seated upper trap stretch 2x20 sec bilat Seated blue pball rollout 2x10 Seated lateral overhead lean over blue peanut ball 2x10 bilat     PATIENT EDUCATION:  Education details: Issued HEP Person educated: Patient Education method: Programmer, multimedia, Facilities manager, and Handouts Education comprehension: verbalized understanding  HOME EXERCISE PROGRAM: Access Code: [email or web address] URL: [email or web address] Date: 05/22/2023 Prepared by: Clydie [name] Casen Pryor  Exercises - Seated Scapular Retraction  - 1 x daily - 7 x weekly - 2 sets - 10 reps - Seated Cervical Retraction  - 1 x daily - 7 x weekly - 2 sets - 10 reps - Seated Assisted Cervical Rotation with Towel  - 1 x daily - 7 x weekly - 1-2 sets - 10 reps - Mid-Lower Cervical Extension SNAG with Strap  - 1 x daily - 7 x weekly - 1-2 sets - 10 reps - Shoulder External Rotation and Scapular Retraction with Resistance  - 1 x daily - 7 x weekly - 2 sets - 10 reps - Standing Shoulder Horizontal Abduction with Resistance  - 1 x daily - 7 x weekly - 2 sets - 10 reps - Standing Shoulder Diagonal Horizontal Abduction 60/120  Degrees with Resistance  - 1 x daily - 7 x weekly - 2 sets - 10 reps  ASSESSMENT:  CLINICAL IMPRESSION: Mr Staniszewski presents to PT reporting that he is doing better than when he first started PT and is agreeable with discharge.  Patient states that the exercises, TENS unit and switching to an orthopedic pillow have all helped him.  Patient's score on the Neck Disability Index has  improved greatly since initial evaluation.  Patient has overall improved with UE strength and continues to deny dizziness.  Patient is ready for discharge from PT today to continue with HEP and follow up with MD, as needed.   OBJECTIVE IMPAIRMENTS: decreased mobility, decreased ROM, dizziness, impaired perceived functional ability, increased muscle spasms, impaired UE functional use, postural dysfunction, and pain.   ACTIVITY LIMITATIONS: carrying, lifting, sleeping, and transfers  PARTICIPATION LIMITATIONS: cleaning, laundry, driving, and community activity  PERSONAL FACTORS: Past/current experiences, Time since onset of injury/illness/exacerbation, and 3+ comorbidities: pre-diabetes, carpal tunnel, OA  are also affecting patient's functional outcome.   REHAB POTENTIAL: Good  CLINICAL DECISION MAKING: Evolving/moderate complexity  EVALUATION COMPLEXITY: Moderate   GOALS: Goals reviewed with patient? Yes  SHORT TERM GOALS: Target date: 03/22/2023  Pt will be independent with initial HEP. Baseline:  Goal status: Met on 03/18/23  2.  Patient will report at least a 25% improvement in symptoms since starting PT. Baseline:  Goal status: Met on 03/27/2023   LONG TERM GOALS: Target date: 07/05/2023  Patient will be independent with advanced HEP. Baseline:  Goal status: Met  2.  Patient will improve Neck Disability Index to no greater than 50% to demonstrate improvements in functional tasks. Baseline: 64% Goal status: Met  3.  Patient will increase cervical A/ROM to [facility] without increased pain or dizziness to allow for improved ease with driving. Baseline:  Goal status: Met on 04/29/2023  4.  Patient will increase right shoulder strength to at least 4/5 to allow him to be able to perform functional tasks with increased ease. Baseline:  Goal status: Met  5.  Patient will report at least a 50% improvement in pain symptoms since starting PT. Baseline:  Goal status: Met on  03/27/2023 (following cervical injection)    PLAN:  PT FREQUENCY: 2x/week  PT DURATION: 8 weeks  PLANNED INTERVENTIONS: 97164- PT Re-evaluation, 97110-Therapeutic exercises, 97530- Therapeutic activity, 97112- Neuromuscular re-education, 97535- Self Care, 65784- Manual therapy, [id number]- Canalith repositioning, [id number]- Aquatic Therapy, 97014- Electrical stimulation (unattended), [id number]- Electrical stimulation (manual), [id number]- Ultrasound, 69629- Traction (mechanical), [id number]- Ionotophoresis 4mg /ml Dexamethasone, Patient/Family education, Balance training, Taping, Dry Needling, Joint mobilization, Joint manipulation, Spinal manipulation, Spinal mobilization, Cryotherapy, and Moist heat   PHYSICAL THERAPY DISCHARGE SUMMARY  Patient agrees to discharge. Patient goals were met. Patient is being discharged due to meeting the stated rehab goals.    Reather Laurence, PT, DPT 07/04/23, 10:29 AM  [facility] [address] [place], [place] [address] Phone # [phone] Fax [phone]

## 2023-08-02 ENCOUNTER — Ambulatory Visit (INDEPENDENT_AMBULATORY_CARE_PROVIDER_SITE_OTHER): Payer: HMO | Admitting: Adult Health

## 2023-08-02 VITALS — BP 110/82 | HR 73 | Temp 97.1°F | Ht 65.0 in | Wt 348.0 lb

## 2023-08-02 DIAGNOSIS — Z152 Genetic susceptibility to obesity: Secondary | ICD-10-CM

## 2023-08-02 DIAGNOSIS — R7303 Prediabetes: Secondary | ICD-10-CM

## 2023-08-02 DIAGNOSIS — Z6841 Body Mass Index (BMI) 40.0 and over, adult: Secondary | ICD-10-CM | POA: Diagnosis not present

## 2023-08-02 DIAGNOSIS — Z0000 Encounter for general adult medical examination without abnormal findings: Secondary | ICD-10-CM | POA: Diagnosis not present

## 2023-08-02 DIAGNOSIS — G4733 Obstructive sleep apnea (adult) (pediatric): Secondary | ICD-10-CM | POA: Diagnosis not present

## 2023-08-02 DIAGNOSIS — E66813 Obesity, class 3: Secondary | ICD-10-CM | POA: Diagnosis not present

## 2023-08-02 DIAGNOSIS — I10 Essential (primary) hypertension: Secondary | ICD-10-CM

## 2023-08-02 DIAGNOSIS — Z125 Encounter for screening for malignant neoplasm of prostate: Secondary | ICD-10-CM

## 2023-08-02 DIAGNOSIS — E8882 Obesity due to disruption of MC4R pathway: Secondary | ICD-10-CM

## 2023-08-02 LAB — COMPREHENSIVE METABOLIC PANEL WITH GFR
ALT: 22 U/L (ref 0–53)
AST: 20 U/L (ref 0–37)
Albumin: 4.1 g/dL (ref 3.5–5.2)
Alkaline Phosphatase: 50 U/L (ref 39–117)
BUN: 15 mg/dL (ref 6–23)
CO2: 28 meq/L (ref 19–32)
Calcium: 9.2 mg/dL (ref 8.4–10.5)
Chloride: 103 meq/L (ref 96–112)
Creatinine, Ser: 0.95 mg/dL (ref 0.40–1.50)
GFR: 81.59 mL/min (ref >60.00)
Glucose, Bld: 109 mg/dL — ABNORMAL HIGH (ref 70–99)
Potassium: 4.3 meq/L (ref 3.5–5.1)
Sodium: 141 meq/L (ref 135–145)
Total Bilirubin: 0.5 mg/dL (ref 0.2–1.2)
Total Protein: 7.1 g/dL (ref 6.0–8.3)

## 2023-08-02 LAB — PSA: PSA: 0.7 ng/mL (ref 0.10–4.00)

## 2023-08-02 LAB — CBC
HCT: 44.4 % (ref 39.0–52.0)
Hemoglobin: 14.7 g/dL (ref 13.0–17.0)
MCHC: 33.1 g/dL (ref 30.0–36.0)
MCV: 90.3 fl (ref 78.0–100.0)
Platelets: 201 10*3/uL (ref 150.0–400.0)
RBC: 4.92 Mil/uL (ref 4.22–5.81)
RDW: 15.1 % (ref 11.5–15.5)
WBC: 6.3 10*3/uL (ref 4.0–10.5)

## 2023-08-02 LAB — LIPID PANEL
Cholesterol: 172 mg/dL (ref 0–200)
HDL: 56 mg/dL (ref >39.00)
LDL Cholesterol: 102 mg/dL — ABNORMAL HIGH (ref 0–99)
NonHDL: 115.51
Total CHOL/HDL Ratio: 3
Triglycerides: 66 mg/dL (ref 0.0–149.0)
VLDL: 13.2 mg/dL (ref 0.0–40.0)

## 2023-08-02 LAB — VITAMIN D 25 HYDROXY (VIT D DEFICIENCY, FRACTURES): VITD: 62.56 ng/mL (ref 30.00–100.00)

## 2023-08-02 LAB — HEMOGLOBIN A1C: Hgb A1c MFr Bld: 6 % (ref 4.6–6.5)

## 2023-08-02 LAB — TSH: TSH: 2.38 u[IU]/mL (ref 0.35–5.50)

## 2023-08-02 MED ORDER — TIRZEPATIDE-WEIGHT MANAGEMENT 5 MG/0.5ML ~~LOC~~ SOLN: 5.0000 mg | SUBCUTANEOUS | 0 refills | Status: AC

## 2023-08-02 NOTE — Progress Notes (Signed)
 Subjective:    Patient ID: James [name], male    DOB: Sep 10, 1955, 68 y.o.   MRN: [id number]  HPI Patient presents for yearly preventative medicine examination. He is a pleasant 68 year old male who  has a past medical history of Arthritis, Bilateral swelling of feet, Chicken pox, Complication of anesthesia, Depression, Hypertension, Joint pain, Obesity, Osteoarthritis, Prediabetes, Shortness of breath, Swelling, and Vitamin D deficiency.  Hypertension-managed with lisinopril 20 mg daily and HCTZ 25 mg daily.  He denies dizziness, lightheadedness, chest pain, or shortness of breath BP Readings from Last 3 Encounters:  08/02/23 110/82  04/01/23 122/60  03/06/23 120/70   Glucose Intolerance -has been on metformin in the past but is no longer taking this  Lab Results  Component Value Date   HGBA1C 6.0 10/31/2022   HGBA1C 6.1 02/23/2022   HGBA1C 5.7 02/23/2021   OSA -is managed by pulmonary.  He does use his CPAP nightly.  Feels that his energy level is better and he wakes up feeling like he gets a good night sleep. AHI 51.5  Obesity - no longer participating at weight loss clinic.  Wt Readings from Last 3 Encounters:  08/02/23 (!) 348 lb (157.9 kg)  04/01/23 (!) 352 lb 8 oz (159.9 kg)  03/06/23 (!) 351 lb (159.2 kg)   All immunizations and health maintenance protocols were reviewed with the patient and needed orders were placed.  Appropriate screening laboratory values were ordered for the patient including screening of hyperlipidemia, renal function and hepatic function. If indicated by BPH, a PSA was ordered.  Medication reconciliation,  past medical history, social history, problem list and allergies were reviewed in detail with the patient  Goals were established with regard to weight loss, exercise, and  diet in compliance with medications  Refuses colon cancer screening    Review of Systems  Constitutional: Negative.   HENT: Negative.    Eyes: Negative.    Respiratory: Negative.    Cardiovascular: Negative.   Gastrointestinal: Negative.   Endocrine: Negative.   Genitourinary: Negative.   Musculoskeletal:  Positive for arthralgias, back pain and gait problem.  Skin: Negative.   Allergic/Immunologic: Negative.   Hematological: Negative.   Psychiatric/Behavioral: Negative.    All other systems reviewed and are negative.  Past Medical History:  Diagnosis Date   Arthritis    Bilateral swelling of feet    Chicken pox    Complication of anesthesia    difficulty waking up   Depression    Hypertension    Joint pain    Obesity    Osteoarthritis    Prediabetes    Shortness of breath    Swelling    feet and legs   Vitamin D deficiency     Social History   Socioeconomic History   Marital status: Married    Spouse name: Not on file   Number of children: Not on file   Years of education: Not on file   Highest education level: Some college, no degree  Occupational History   Occupation: At home caregiver  Tobacco Use   Smoking status: Former    Current packs/day: 0.00    Types: Cigarettes    Quit date: 07/16/1992    Years since quitting: 31.0   Smokeless tobacco: Never  Substance and Sexual Activity   Alcohol use: Yes    Comment: very little per patient    Drug use: No   Sexual activity: Not on file  Other Topics Concern   Not  on file  Social History Narrative   Work or School: self employed, Geophysicist/field seismologist - Glass blower/designer Situation: living with and caring for wife whom had stroke and brain tumor      Spiritual Beliefs: Methodist      Lifestyle: no regular exercise, diet poor            Social Drivers of Corporate investment banker Strain: Medium Risk (07/26/2023)   Overall Financial Resource Strain (CARDIA)    Difficulty of Paying Living Expenses: Somewhat hard  Food Insecurity: No Food Insecurity (07/26/2023)   Hunger Vital Sign    Worried About Running Out of Food in the Last Year: Never true    Ran  Out of Food in the Last Year: Never true  Transportation Needs: No Transportation Needs (07/26/2023)   PRAPARE - Administrator, Civil Service (Medical): No    Lack of Transportation (Non-Medical): No  Physical Activity: Unknown (07/26/2023)   Exercise Vital Sign    Days of Exercise per Week: 0 days    Minutes of Exercise per Session: Not on file  Stress: Stress Concern Present (07/26/2023)   [employer] of Occupational Health - Occupational Stress Questionnaire    Feeling of Stress : To some extent  Social Connections: Socially Isolated (07/26/2023)   Social Connection and Isolation Panel [NHANES]    Frequency of Communication with Friends and Family: Twice a week    Frequency of Social Gatherings with Friends and Family: Never    Attends Religious Services: Never    Database administrator or Organizations: No    Attends Engineer, structural: Not on file    Marital Status: Married  Catering manager Violence: Not At Risk (06/01/2022)   Humiliation, Afraid, Rape, and Kick questionnaire    Fear of Current or Ex-Partner: No    Emotionally Abused: No    Physically Abused: No    Sexually Abused: No    Past Surgical History:  Procedure Laterality Date   cartilage removal     COLONOSCOPY WITH PROPOFOL N/A 12/14/2019   Procedure: COLONOSCOPY WITH PROPOFOL;  Surgeon: Sherrilyn Rist, MD;  Location: [facility];  Service: Gastroenterology;  Laterality: N/A;   KNEE SURGERY  2000   POLYPECTOMY  12/14/2019   Procedure: POLYPECTOMY;  Surgeon: Sherrilyn Rist, MD;  Location: [facility];  Service: Gastroenterology;;   TOTAL HIP ARTHROPLASTY Left 02/11/2019   Procedure: TOTAL HIP ARTHROPLASTY-POSTERIOR;  Surgeon: Ollen Gross, MD;  Location: [facility];  Service: Orthopedics;  Laterality: Left;     Family History  Problem Relation Age of Onset   Cancer Mother        lung and soft tissue cancer from radiation   Stroke Mother    Obesity Mother    Heart disease  Father        CHF   Diabetes Father    Hypertension Father    Obesity Father    Heart disease Maternal Grandfather    Diabetes Paternal Grandmother    Ovarian cancer Sister    Colon cancer Neg Hx    Colon polyps Neg Hx    Esophageal cancer Neg Hx    Rectal cancer Neg Hx    Stomach cancer Neg Hx     No Known Allergies  Current Outpatient Medications on File Prior to Visit  Medication Sig Dispense Refill   acetaminophen (TYLENOL) 500 MG tablet Take 1,000 mg by mouth at bedtime  as needed for moderate pain.     amoxicillin (AMOXIL) 500 MG capsule Take 2,000 mg by mouth See admin instructions. Take 2000 mg 1 hour prior to dental work     cetirizine (ZYRTEC) 10 MG tablet Take 10 mg by mouth daily.     cholecalciferol (VITAMIN D3) 25 MCG (1000 UNIT) tablet Take 2,000 Units by mouth daily.     fluticasone (FLONASE) 50 MCG/ACT nasal spray Place 1 spray into both nostrils daily.      furosemide (LASIX) 40 MG tablet Take 1 tablet by mouth daily for three days in a row prn . 30 tablet 0   hydrochlorothiazide (HYDRODIURIL) 25 MG tablet Take 1 tablet (25 mg total) by mouth daily. 90 tablet 0   ibuprofen (ADVIL) 200 MG tablet Take 400 mg by mouth at bedtime as needed for moderate pain.     Ketotifen Fumarate (ALLERGY EYE DROPS OP) Place 1 drop into both eyes daily as needed (allergies).     lidocaine (LMX) 4 % cream Apply 1 application topically daily as needed (pain).     lisinopril (ZESTRIL) 10 MG tablet Take 2 tablets (20 mg total) by mouth daily. 180 tablet 0   Magnesium 250 MG TABS Take by mouth.     Multiple Vitamin (MULTIVITAMIN) tablet Take 1 tablet by mouth daily.     potassium chloride SA (KLOR-CON M) 20 MEQ tablet Take 1 tablet (20 mEq total) by mouth daily. Take with lasix 30 tablet 0   No current facility-administered medications on file prior to visit.    BP 110/82   Pulse 73   Temp (!) 97.1 F (36.2 C) (Oral)   Ht 5\' 5"  (1.651 m)   Wt (!) 348 lb (157.9 kg)   SpO2 97%   BMI  57.91 kg/m       Objective:   Physical Exam Vitals and nursing note reviewed.  Constitutional:      General: He is not in acute distress.    Appearance: Normal appearance. He is obese. He is not ill-appearing.  HENT:     Head: Normocephalic and atraumatic.     Right Ear: Tympanic membrane, ear canal and external ear normal. There is no impacted cerumen.     Left Ear: Tympanic membrane, ear canal and external ear normal. There is no impacted cerumen.     Nose: Nose normal. No congestion or rhinorrhea.     Mouth/Throat:     Mouth: Mucous membranes are moist.     Pharynx: Oropharynx is clear.  Eyes:     Extraocular Movements: Extraocular movements intact.     Conjunctiva/sclera: Conjunctivae normal.     Pupils: Pupils are equal, round, and reactive to light.  Neck:     Vascular: No carotid bruit.  Cardiovascular:     Rate and Rhythm: Normal rate and regular rhythm.     Pulses: Normal pulses.     Heart sounds: No murmur heard.    No friction rub. No gallop.  Pulmonary:     Effort: Pulmonary effort is normal.     Breath sounds: Normal breath sounds.  Abdominal:     General: Abdomen is flat. Bowel sounds are normal. There is no distension.     Palpations: Abdomen is soft. There is no mass.     Tenderness: There is no abdominal tenderness. There is no guarding or rebound.     Hernia: No hernia is present.  Musculoskeletal:        General: Normal range of motion.  Cervical back: Normal range of motion and neck supple.  Lymphadenopathy:     Cervical: No cervical adenopathy.  Skin:    General: Skin is warm and dry.     Capillary Refill: Capillary refill takes less than 2 seconds.  Neurological:     General: No focal deficit present.     Mental Status: He is alert and oriented to person, place, and time.  Psychiatric:        Mood and Affect: Mood normal.        Behavior: Behavior normal.        Thought Content: Thought content normal.        Judgment: Judgment normal.         Assessment & Plan:  1. Routine general medical examination at a health care facility (Primary) Today patient counseled on age appropriate routine health concerns for screening and prevention, each reviewed and up to date or declined. Immunizations reviewed and up to date or declined. Labs ordered and reviewed. Risk factors for depression reviewed and negative. Hearing function and visual acuity are intact. ADLs screened and addressed as needed. Functional ability and level of safety reviewed and appropriate. Education, counseling and referrals performed based on assessed risks today. Patient provided with a copy of personalized plan for preventive services. - Follow up in one year or sooner if needed  2. Essential hypertension - Well controlled. No change in medication  - Lipid panel; Future - TSH; Future - CBC; Future - Comprehensive metabolic panel with GFR; Future  3. Prediabetes  - Lipid panel; Future - TSH; Future - CBC; Future - Comprehensive metabolic panel with GFR; Future - Hemoglobin A1c; Future  4. OSA (obstructive sleep apnea) - Will see if we can get Zepbound approved for him. He did not tolerate Ozempic in the past. I have given him a sample of 2.5 mg Mounjaro as a trial today. Lot: [id number]. Exp 2026-Aug-26 - Lipid panel; Future - TSH; Future - CBC; Future - Comprehensive metabolic panel with GFR; Future - Vitamin D, 25-hydroxy; Future - tirzepatide 5 MG/0.5ML injection vial; Inject 5 mg into the skin once a week.  Dispense: 2 mL; Refill: 0  5. 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  Dispense: 2 mL;  Refill: 0  6. Prostate cancer screening  - PSA; Future  Shirline Frees, NP

## 2023-08-02 NOTE — Patient Instructions (Signed)
 It was great seeing you today   We will follow up with you regarding your lab work   Please let me know if you need anything

## 2023-08-09 ENCOUNTER — Encounter: Payer: Self-pay | Admitting: Adult Health

## 2023-08-09 NOTE — Telephone Encounter (Signed)
**Note De-identified  [place] Obfuscation** Please advise 

## 2023-08-30 ENCOUNTER — Encounter: Payer: Self-pay | Admitting: Adult Health

## 2023-08-30 NOTE — Telephone Encounter (Signed)
**Note De-identified  [place] Obfuscation** Please advise 

## 2023-09-03 ENCOUNTER — Encounter: Payer: Self-pay | Admitting: Adult Health

## 2023-10-01 ENCOUNTER — Other Ambulatory Visit: Payer: Self-pay | Admitting: Adult Health

## 2023-10-01 DIAGNOSIS — I10 Essential (primary) hypertension: Secondary | ICD-10-CM

## 2023-10-07 ENCOUNTER — Telehealth: Admitting: Physician Assistant

## 2023-10-07 DIAGNOSIS — H5789 Other specified disorders of eye and adnexa: Secondary | ICD-10-CM

## 2023-10-07 NOTE — Progress Notes (Signed)
  Because of the symptoms you are experiencing with changes in vision, I feel your condition warrants further evaluation and I recommend that you be seen in a face-to-face visit. If you have an eye doctor you may start by calling their office, an eye exam would be best. If they cannot see you, an Urgent Care would also be appropriate    NOTE: There will be NO CHARGE for this E-Visit   If you are having a true medical emergency, please call 911.     For an urgent face to face visit, [name] has multiple urgent care centers for your convenience.  Click the link below for the full list of locations and hours, walk-in wait times, appointment scheduling options and driving directions:  Urgent Care - [place], [place], [place], [place], [place], [place]  [place]     Your MyChart E-visit questionnaire answers were reviewed by a board certified advanced clinical practitioner to complete your personal care plan based on your specific symptoms.    Thank you for using e-Visits.

## 2023-10-08 ENCOUNTER — Ambulatory Visit (INDEPENDENT_AMBULATORY_CARE_PROVIDER_SITE_OTHER): Admitting: Adult Health

## 2023-10-08 ENCOUNTER — Encounter: Payer: Self-pay | Admitting: Adult Health

## 2023-10-08 VITALS — BP 120/74 | HR 74 | Temp 98.2°F | Ht 65.0 in | Wt 341.0 lb

## 2023-10-08 DIAGNOSIS — H109 Unspecified conjunctivitis: Secondary | ICD-10-CM

## 2023-10-08 MED ORDER — ERYTHROMYCIN 5 MG/GM OP OINT
1.0000 | TOPICAL_OINTMENT | Freq: Four times a day (QID) | OPHTHALMIC | 1 refills | Status: AC
Start: 2023-10-08 — End: ?

## 2023-10-08 NOTE — Progress Notes (Signed)
 Subjective:    Patient ID: James [name], male    DOB: [birth year], 68 y.o.   MRN: [id number]  Eye Problem     68 year old male who  has a past medical history of Arthritis, Bilateral swelling of feet, Chicken pox, Complication of anesthesia, Depression, Hypertension, Joint pain, Obesity, Osteoarthritis, Prediabetes, Shortness of breath, Swelling, and Vitamin D  deficiency.  He presents to the office today for an acute issue. He reports that his right eye has become itchy and burning with purulent drainage. His eye lids have been matted shut when he wakes up. Symptoms have been present for 2 days. He denies trauma or injury to his right eye. No photophobia.   Review of Systems See HPI   Past Medical History:  Diagnosis Date   Arthritis    Bilateral swelling of feet    Chicken pox    Complication of anesthesia    difficulty waking up   Depression    Hypertension    Joint pain    Obesity    Osteoarthritis    Prediabetes    Shortness of breath    Swelling    feet and legs   Vitamin D  deficiency     Social History   Socioeconomic History   Marital status: Married    Spouse name: Not on file   Number of children: Not on file   Years of education: Not on file   Highest education level: Some college, no degree  Occupational History   Occupation: At home caregiver  Tobacco Use   Smoking status: Former    Current packs/day: 0.00    Types: Cigarettes    Quit date: 07/16/1992    Years since quitting: 31.2   Smokeless tobacco: Never  Substance and Sexual Activity   Alcohol use: Yes    Comment: very little per patient    Drug use: No   Sexual activity: Not on file  Other Topics Concern   Not on file  Social History Narrative   Work or School: self employed, 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      Home Situation: living with and caring for wife whom had stroke and brain tumor      Spiritual Beliefs: Methodist      Lifestyle: no regular exercise, diet poor             Social Drivers of Corporate investment banker Strain: Medium Risk (07/26/2023)   Overall Financial Resource Strain (CARDIA)    Difficulty of Paying Living Expenses: Somewhat hard  Food Insecurity: No Food Insecurity (07/26/2023)   Hunger Vital Sign    Worried About Running Out of Food in the Last Year: Never true    Ran Out of Food in the Last Year: Never true  Transportation Needs: No Transportation Needs (07/26/2023)   PRAPARE - Administrator, Civil Service (Medical): No    Lack of Transportation (Non-Medical): No  Physical Activity: Unknown (07/26/2023)   Exercise Vital Sign    Days of Exercise per Week: 0 days    Minutes of Exercise per Session: Not on file  Stress: Stress Concern Present (07/26/2023)   [employer] of Occupational Health - Occupational Stress Questionnaire    Feeling of Stress : To some extent  Social Connections: Socially Isolated (07/26/2023)   Social Connection and Isolation Panel [NHANES]    Frequency of Communication with Friends and Family: Twice a week    Frequency of Social Gatherings with Friends and Family:  Never    Attends Religious Services: Never    Active Member of Clubs or Organizations: No    Attends Banker Meetings: Not on file    Marital Status: Married  Intimate Partner Violence: Not At Risk (06/01/2022)   Humiliation, Afraid, Rape, and Kick questionnaire    Fear of Current or Ex-Partner: No    Emotionally Abused: No    Physically Abused: No    Sexually Abused: No    Past Surgical History:  Procedure Laterality Date   cartilage removal     COLONOSCOPY WITH PROPOFOL  N/A 12/14/2019   Procedure: COLONOSCOPY WITH PROPOFOL ;  Surgeon: Albertina Hugger, MD;  Location: [facility];  Service: Gastroenterology;  Laterality: N/A;   KNEE SURGERY  2000   POLYPECTOMY  12/14/2019   Procedure: POLYPECTOMY;  Surgeon: Albertina Hugger, MD;  Location: [facility];  Service: Gastroenterology;;   TOTAL HIP ARTHROPLASTY Left  02/11/2019   Procedure: TOTAL HIP ARTHROPLASTY-POSTERIOR;  Surgeon: Liliane Rei, MD;  Location: [facility];  Service: Orthopedics;  Laterality: Left;     Family History  Problem Relation Age of Onset   Cancer Mother        lung and soft tissue cancer from radiation   Stroke Mother    Obesity Mother    Heart disease Father        CHF   Diabetes Father    Hypertension Father    Obesity Father    Heart disease Maternal Grandfather    Diabetes Paternal Grandmother    Ovarian cancer Sister    Colon cancer Neg Hx    Colon polyps Neg Hx    Esophageal cancer Neg Hx    Rectal cancer Neg Hx    Stomach cancer Neg Hx     No Known Allergies  Current Outpatient Medications on File Prior to Visit  Medication Sig Dispense Refill   acetaminophen  (TYLENOL ) 500 MG tablet Take 1,000 mg by mouth at bedtime as needed for moderate pain.     amoxicillin (AMOXIL) 500 MG capsule Take 2,000 mg by mouth See admin instructions. Take 2000 mg 1 hour prior to dental work     cetirizine (ZYRTEC) 10 MG tablet Take 10 mg by mouth daily.     cholecalciferol (VITAMIN D3) 25 MCG (1000 UNIT) tablet Take 2,000 Units by mouth daily.     fluticasone (FLONASE) 50 MCG/ACT nasal spray Place 1 spray into both nostrils daily.      furosemide  (LASIX ) 40 MG tablet Take 1 tablet by mouth daily for three days in a row prn . 30 tablet 0   hydrochlorothiazide  (HYDRODIURIL ) 25 MG tablet TAKE 1 TABLET (25 MG TOTAL) BY MOUTH DAILY. 90 tablet 0   ibuprofen (ADVIL) 200 MG tablet Take 400 mg by mouth at bedtime as needed for moderate pain.     Ketotifen Fumarate (ALLERGY EYE DROPS OP) Place 1 drop into both eyes daily as needed (allergies).     lidocaine  (LMX) 4 % cream Apply 1 application topically daily as needed (pain).     lisinopril  (ZESTRIL ) 10 MG tablet TAKE 2 TABLETS BY MOUTH EVERY DAY 180 tablet 0   Magnesium 250 MG TABS Take by mouth.     Multiple Vitamin (MULTIVITAMIN) tablet Take 1 tablet by mouth daily.      potassium chloride  SA (KLOR-CON  M) 20 MEQ tablet Take 1 tablet (20 mEq total) by mouth daily. Take with lasix  30 tablet 0   No current facility-administered medications on file  prior to visit.    BP 120/74   Pulse 74   Temp 98.2 F (36.8 C) (Oral)   Ht 5\' 5"  (1.651 m)   Wt (!) 341 lb (154.7 kg)   SpO2 98%   BMI 56.75 kg/m       Objective:   Physical Exam Vitals and nursing note reviewed.  Constitutional:      Appearance: Normal appearance.  Eyes:     Extraocular Movements: Extraocular movements intact.     Conjunctiva/sclera:     Right eye: Right conjunctiva is injected. Exudate and hemorrhage present.  Neurological:     Mental Status: He is alert.        Assessment & Plan:  1. Bacterial conjunctivitis of right eye (Primary) - Exam consistent with bacterial conjunctivitis.  - erythromycin ophthalmic ointment; Place 1 Application into the right eye 4 (four) times daily.  Dispense: 3.5 g; Refill: 1   Alto Atta, NP

## 2023-12-06 ENCOUNTER — Ambulatory Visit: Admitting: Adult Health

## 2023-12-24 ENCOUNTER — Other Ambulatory Visit: Payer: Self-pay | Admitting: Adult Health

## 2023-12-24 DIAGNOSIS — I10 Essential (primary) hypertension: Secondary | ICD-10-CM

## 2024-02-24 ENCOUNTER — Ambulatory Visit: Admitting: Pulmonary Disease

## 2024-02-24 ENCOUNTER — Telehealth (INDEPENDENT_AMBULATORY_CARE_PROVIDER_SITE_OTHER): Admitting: Pulmonary Disease

## 2024-02-24 DIAGNOSIS — G4733 Obstructive sleep apnea (adult) (pediatric): Secondary | ICD-10-CM

## 2024-02-24 NOTE — Progress Notes (Signed)
 Virtual Visit via Video Note  I connected with James [name] on 02/24/24 at  3:15 PM EDT by a video enabled telemedicine application and verified that I am speaking with the correct person using two identifiers.  Location: Patient: Patient at home Provider: In the office, [address]   I discussed the limitations of evaluation and management by telemedicine and the availability of in person appointments. The patient expressed understanding and agreed to proceed.  History of Present Illness: Patient with severe obstructive sleep apnea on CPAP therapy  Sleeping well, tolerating machine well  Still does have some mask issues Sometimes does not sleep well through the night and sometimes does take the mask off during sleep  Overall feels well  No significant health issues since the last visit  Observations/Objective: Appears well on camera, looks rested  Assessment and Plan: Obstructive sleep apnea  Download from machine shows excellent compliance of 100% Average use of 6 hours 49 minutes AutoSet of 5-20 with an EPR of 2 Residual AHI of 7.6, does have some mask leaks  We will limit his pressure from 5-20 to 5-14  May try magnesium glycinate for his leg cramping  Encouraged to give us  a call with any significant symptoms  Follow-up in about 6 months  Follow Up Instructions:    I discussed the assessment and treatment plan with the patient. The patient was provided an opportunity to ask questions and all were answered. The patient agreed with the plan and demonstrated an understanding of the instructions.   The patient was advised to call back or seek an in-person evaluation if the symptoms worsen or if the condition fails to improve as anticipated.  I provided 22 minutes of non-face-to-face time during this encounter.   James DELENA Epley, MD

## 2024-02-24 NOTE — Addendum Note (Signed)
 Addended by: Anglia Blakley M on: 02/24/2024 03:41 PM   Modules accepted: Orders

## 2024-02-24 NOTE — Patient Instructions (Addendum)
 We will send an order to the medical supply company to change her pressure from 5-20 to 5-14  Try magnesium glycinate for the leg cramping  Continue using your CPAP on a nightly basis  Follow-up in about 6 months  Download from the machine shows it is working well

## 2024-03-24 ENCOUNTER — Other Ambulatory Visit (HOSPITAL_BASED_OUTPATIENT_CLINIC_OR_DEPARTMENT_OTHER): Payer: Self-pay

## 2024-03-24 ENCOUNTER — Encounter: Payer: Self-pay | Admitting: Adult Health

## 2024-03-24 ENCOUNTER — Other Ambulatory Visit: Payer: Self-pay

## 2024-03-24 MED ORDER — HYDROCHLOROTHIAZIDE 25 MG PO TABS
25.0000 mg | ORAL_TABLET | Freq: Every day | ORAL | 0 refills | Status: AC
  Filled 2024-03-24: qty 90, 90d supply, fill #0

## 2024-03-25 ENCOUNTER — Other Ambulatory Visit (HOSPITAL_BASED_OUTPATIENT_CLINIC_OR_DEPARTMENT_OTHER): Payer: Self-pay

## 2024-03-25 MED FILL — Lisinopril Tab 10 MG: ORAL | 90 days supply | Qty: 180 | Fill #0 | Status: AC

## 2024-03-30 DIAGNOSIS — E119 Type 2 diabetes mellitus without complications: Secondary | ICD-10-CM | POA: Diagnosis not present

## 2024-03-30 DIAGNOSIS — H2513 Age-related nuclear cataract, bilateral: Secondary | ICD-10-CM | POA: Diagnosis not present

## 2024-05-04 ENCOUNTER — Telehealth

## 2024-05-04 DIAGNOSIS — J069 Acute upper respiratory infection, unspecified: Secondary | ICD-10-CM

## 2024-05-04 NOTE — Patient Instructions (Signed)
 " James [name], thank you for joining James CHRISTELLA Belt, NP for today's virtual visit.  While this provider is not your primary care provider (PCP), if your PCP is located in our provider database this encounter information will be shared with them immediately following your visit.   A [facility] MyChart account gives you access to today's visit and all your visits, tests, and labs performed at [facility]  click here if you don't have a [facility] MyChart account or go to [email or web address]  Consent: (Patient) James [name] provided verbal consent for this virtual visit at the beginning of the encounter.  Current Medications:  Current Outpatient Medications:    acetaminophen  (TYLENOL ) 500 MG tablet, Take 1,000 mg by mouth at bedtime as needed for moderate pain., Disp: , Rfl:    amoxicillin (AMOXIL) 500 MG capsule, Take 2,000 mg by mouth See admin instructions. Take 2000 mg 1 hour prior to dental work, Disp: , Rfl:    cetirizine (ZYRTEC) 10 MG tablet, Take 10 mg by mouth daily., Disp: , Rfl:    cholecalciferol (VITAMIN D3) 25 MCG (1000 UNIT) tablet, Take 2,000 Units by mouth daily., Disp: , Rfl:    erythromycin  ophthalmic ointment, Place 1 Application into the right eye 4 (four) times daily., Disp: 3.5 g, Rfl: 1   fluticasone (FLONASE) 50 MCG/ACT nasal spray, Place 1 spray into both nostrils daily. , Disp: , Rfl:    furosemide  (LASIX ) 40 MG tablet, Take 1 tablet by mouth daily for three days in a row prn ., Disp: 30 tablet, Rfl: 0   hydrochlorothiazide  (HYDRODIURIL ) 25 MG tablet, Take 1 tablet (25 mg total) by mouth daily., Disp: 90 tablet, Rfl: 0   ibuprofen (ADVIL) 200 MG tablet, Take 400 mg by mouth at bedtime as needed for moderate pain., Disp: , Rfl:    Ketotifen Fumarate (ALLERGY EYE DROPS OP), Place 1 drop into both eyes daily as needed (allergies)., Disp: , Rfl:    lidocaine  (LMX) 4 % cream, Apply 1 application topically daily as needed (pain)., Disp: , Rfl:     lisinopril  (ZESTRIL ) 10 MG tablet, Take 2 tablets (20 mg total) by mouth daily., Disp: 180 tablet, Rfl: 1   Magnesium 250 MG TABS, Take by mouth., Disp: , Rfl:    Multiple Vitamin (MULTIVITAMIN) tablet, Take 1 tablet by mouth daily., Disp: , Rfl:    potassium chloride  SA (KLOR-CON  M) 20 MEQ tablet, Take 1 tablet (20 mEq total) by mouth daily. Take with lasix , Disp: 30 tablet, Rfl: 0   Medications ordered in this encounter:  No orders of the defined types were placed in this encounter.    *If you need refills on other medications prior to your next appointment, please contact your pharmacy*  Follow-Up: Call back or seek an in-person evaluation if the symptoms worsen or if the condition fails to improve as anticipated.  [facility] [phone]  Other Instructions You are doing all of the right things at home. Most cold viruses get better within 14 days. If you are not better by 14 days, please get checked in person. If you develop shortness of breath or a lot of chest congestion/heavy coughing, please get checked in person, such as by your primary care provider or at an urgent care.    If you have been instructed to have an in-person evaluation today at a local Urgent Care facility, please use the link below. It will take you to a list of all of our  available [facility], including address, phone number and hours of operation. Please do not delay care.  [facility]  If you or a family member do not have a primary care provider, use the link below to schedule a visit and establish care. When you choose a [facility] primary care physician or advanced practice provider, you gain a long-term partner in health. Find a Primary Care Provider  Learn more about [facility] in-office and virtual care options: [facility] - Get Care Now  "

## 2024-05-04 NOTE — Progress Notes (Signed)
 " Virtual Visit Consent   James [name], you are scheduled for a virtual visit with a [facility] provider today. Just as with appointments in the office, your consent must be obtained to participate. Your consent will be active for this visit and any virtual visit you may have with one of our providers in the next 365 days. If you have a MyChart account, a copy of this consent can be sent to you electronically.  As this is a virtual visit, video technology does not allow for your provider to perform a traditional examination. This may limit your provider's ability to fully assess your condition. If your provider identifies any concerns that need to be evaluated in person or the need to arrange testing (such as labs, EKG, etc.), we will make arrangements to do so. Although advances in technology are sophisticated, we cannot ensure that it will always work on either your end or our end. If the connection with a video visit is poor, the visit may have to be switched to a telephone visit. With either a video or telephone visit, we are not always able to ensure that we have a secure connection.  By engaging in this virtual visit, you consent to the provision of healthcare and authorize for your insurance to be billed (if applicable) for the services provided during this visit. Depending on your insurance coverage, you may receive a charge related to this service.  I need to obtain your verbal consent now. Are you willing to proceed with your visit today? James [name] has provided verbal consent on 05/04/2024 for a virtual visit (video or telephone). James CHRISTELLA Belt, NP  Date: 05/04/2024 9:07 AM   Virtual Visit via Video Note   I, James [name], connected with  James [name]  ([id number], [birth year]) on 05/04/2024 at  9:00 AM EST by a video-enabled telemedicine application and verified that I am speaking with the correct person using two identifiers.  Location: Patient: Virtual Visit Location  Patient: Home Provider: Virtual Visit Location Provider: Home Office   I discussed the limitations of evaluation and management by telemedicine and the availability of in person appointments. The patient expressed understanding and agreed to proceed.    History of Present Illness: James [name] is a 68 y.o. who identifies as a male who was assigned male at birth, and is being seen today for cold. Is Congested, coughing. Has thick mucus. Sick x4 days. Feeling a little bit better now. Able to use CPAP last night and slept well.   Taking Dayquil, mucinex  - generic. Using Saline spray and flonase. Drinking lots of fluids.   No fever, no body aches. No SOB.   HPI: HPI  Problems:  Patient Active Problem List   Diagnosis Date Noted   Depression 09/08/2020   Special screening for malignant neoplasms, colon    Benign neoplasm of rectum    OA (osteoarthritis) of hip 02/11/2019   Other hyperlipidemia 04/10/2017   Vitamin D  deficiency 04/10/2017   Prediabetes 12/17/2016   Class 3 severe obesity with serious comorbidity and body mass index (BMI) of 50.0 to 59.9 in adult (HCC) 12/17/2016   At risk for diabetes mellitus 10/02/2016   Hyperglycemia 09/04/2016   Severe obesity (BMI >= 40) (HCC) 01/15/2013   OBESITY, MORBID 01/20/2007   Essential hypertension 10/31/2006    Allergies: Allergies[1] Medications: Current Medications[2]  Observations/Objective: Patient is well-developed, well-nourished in no acute distress.  Resting comfortably  at home.  Head is normocephalic, atraumatic.  No labored breathing.  Speech is clear and coherent with logical content.  Patient is alert and oriented at baseline.    Assessment and Plan: 1. Upper respiratory tract infection, unspecified type (Primary)  Pt thinks he has a cold and just wanted to make sure he is taking correct treatments at home. I reassured he is doing all the right things, discussed reasons for seeking in person care if worsen.    Follow Up Instructions: I discussed the assessment and treatment plan with the patient. The patient was provided an opportunity to ask questions and all were answered. The patient agreed with the plan and demonstrated an understanding of the instructions.  A copy of instructions were sent to the patient via MyChart unless otherwise noted below.   The patient was advised to call back or seek an in-person evaluation if the symptoms worsen or if the condition fails to improve as anticipated.    James CHRISTELLA Belt, NP     [1] No Known Allergies [2]  Current Outpatient Medications:    acetaminophen  (TYLENOL ) 500 MG tablet, Take 1,000 mg by mouth at bedtime as needed for moderate pain., Disp: , Rfl:    amoxicillin (AMOXIL) 500 MG capsule, Take 2,000 mg by mouth See admin instructions. Take 2000 mg 1 hour prior to dental work, Disp: , Rfl:    cetirizine (ZYRTEC) 10 MG tablet, Take 10 mg by mouth daily., Disp: , Rfl:    cholecalciferol (VITAMIN D3) 25 MCG (1000 UNIT) tablet, Take 2,000 Units by mouth daily., Disp: , Rfl:    erythromycin  ophthalmic ointment, Place 1 Application into the right eye 4 (four) times daily., Disp: 3.5 g, Rfl: 1   fluticasone (FLONASE) 50 MCG/ACT nasal spray, Place 1 spray into both nostrils daily. , Disp: , Rfl:    furosemide  (LASIX ) 40 MG tablet, Take 1 tablet by mouth daily for three days in a row prn ., Disp: 30 tablet, Rfl: 0   hydrochlorothiazide  (HYDRODIURIL ) 25 MG tablet, Take 1 tablet (25 mg total) by mouth daily., Disp: 90 tablet, Rfl: 0   ibuprofen (ADVIL) 200 MG tablet, Take 400 mg by mouth at bedtime as needed for moderate pain., Disp: , Rfl:    Ketotifen Fumarate (ALLERGY EYE DROPS OP), Place 1 drop into both eyes daily as needed (allergies)., Disp: , Rfl:    lidocaine  (LMX) 4 % cream, Apply 1 application topically daily as needed (pain)., Disp: , Rfl:    lisinopril  (ZESTRIL ) 10 MG tablet, Take 2 tablets (20 mg total) by mouth daily., Disp: 180 tablet, Rfl: 1    Magnesium 250 MG TABS, Take by mouth., Disp: , Rfl:    Multiple Vitamin (MULTIVITAMIN) tablet, Take 1 tablet by mouth daily., Disp: , Rfl:    potassium chloride  SA (KLOR-CON  M) 20 MEQ tablet, Take 1 tablet (20 mEq total) by mouth daily. Take with lasix , Disp: 30 tablet, Rfl: 0  "
